# Patient Record
Sex: Male | Born: 1955
Health system: Southern US, Community
[De-identification: ages and names within clinical notes are randomized; demographics above are authoritative.]

## PROBLEM LIST (undated history)

## (undated) ENCOUNTER — Emergency Department (HOSPITAL_COMMUNITY): Payer: Medicare HMO

## (undated) DIAGNOSIS — M109 Gout, unspecified: Secondary | ICD-10-CM

## (undated) DIAGNOSIS — I499 Cardiac arrhythmia, unspecified: Secondary | ICD-10-CM

## (undated) DIAGNOSIS — F419 Anxiety disorder, unspecified: Secondary | ICD-10-CM

## (undated) DIAGNOSIS — C73 Malignant neoplasm of thyroid gland: Secondary | ICD-10-CM

## (undated) DIAGNOSIS — F1910 Other psychoactive substance abuse, uncomplicated: Secondary | ICD-10-CM

## (undated) DIAGNOSIS — J449 Chronic obstructive pulmonary disease, unspecified: Secondary | ICD-10-CM

## (undated) DIAGNOSIS — F32A Depression, unspecified: Secondary | ICD-10-CM

## (undated) DIAGNOSIS — I639 Cerebral infarction, unspecified: Secondary | ICD-10-CM

## (undated) DIAGNOSIS — H04129 Dry eye syndrome of unspecified lacrimal gland: Secondary | ICD-10-CM

## (undated) DIAGNOSIS — M1990 Unspecified osteoarthritis, unspecified site: Secondary | ICD-10-CM

## (undated) DIAGNOSIS — H269 Unspecified cataract: Secondary | ICD-10-CM

## (undated) DIAGNOSIS — E785 Hyperlipidemia, unspecified: Secondary | ICD-10-CM

## (undated) DIAGNOSIS — E039 Hypothyroidism, unspecified: Secondary | ICD-10-CM

## (undated) DIAGNOSIS — F329 Major depressive disorder, single episode, unspecified: Secondary | ICD-10-CM

## (undated) DIAGNOSIS — I10 Essential (primary) hypertension: Secondary | ICD-10-CM

## (undated) DIAGNOSIS — K219 Gastro-esophageal reflux disease without esophagitis: Secondary | ICD-10-CM

## (undated) DIAGNOSIS — T7840XA Allergy, unspecified, initial encounter: Secondary | ICD-10-CM

## (undated) HISTORY — DX: Major depressive disorder, single episode, unspecified: F32.9

## (undated) HISTORY — DX: Other psychoactive substance abuse, uncomplicated: F19.10

## (undated) HISTORY — DX: Essential (primary) hypertension: I10

## (undated) HISTORY — PX: CATARACT EXTRACTION: SUR2

## (undated) HISTORY — PX: EYE SURGERY: SHX253

## (undated) HISTORY — DX: Cerebral infarction, unspecified: I63.9

## (undated) HISTORY — PX: UPPER GI ENDOSCOPY: SHX6162

## (undated) HISTORY — DX: Allergy, unspecified, initial encounter: T78.40XA

## (undated) HISTORY — DX: Hyperlipidemia, unspecified: E78.5

## (undated) HISTORY — PX: ABDOMINAL SURGERY: SHX537

## (undated) HISTORY — PX: SPINE SURGERY: SHX786

## (undated) HISTORY — DX: Gout, unspecified: M10.9

## (undated) HISTORY — PX: FRACTURE SURGERY: SHX138

## (undated) HISTORY — DX: Depression, unspecified: F32.A

## (undated) HISTORY — DX: Unspecified osteoarthritis, unspecified site: M19.90

## (undated) HISTORY — DX: Unspecified cataract: H26.9

## (undated) HISTORY — PX: COLONOSCOPY: SHX174

---

## 1998-05-24 ENCOUNTER — Emergency Department (HOSPITAL_COMMUNITY): Admission: EM | Admit: 1998-05-24 | Discharge: 1998-05-24 | Payer: Self-pay | Admitting: Emergency Medicine

## 2013-04-18 ENCOUNTER — Other Ambulatory Visit: Payer: Self-pay | Admitting: Family Medicine

## 2013-04-18 DIAGNOSIS — Z87891 Personal history of nicotine dependence: Secondary | ICD-10-CM

## 2014-12-10 DIAGNOSIS — F329 Major depressive disorder, single episode, unspecified: Secondary | ICD-10-CM | POA: Diagnosis not present

## 2014-12-10 DIAGNOSIS — L219 Seborrheic dermatitis, unspecified: Secondary | ICD-10-CM | POA: Diagnosis not present

## 2014-12-10 DIAGNOSIS — F17210 Nicotine dependence, cigarettes, uncomplicated: Secondary | ICD-10-CM | POA: Diagnosis not present

## 2014-12-10 DIAGNOSIS — E785 Hyperlipidemia, unspecified: Secondary | ICD-10-CM | POA: Diagnosis not present

## 2014-12-10 DIAGNOSIS — H9192 Unspecified hearing loss, left ear: Secondary | ICD-10-CM | POA: Diagnosis not present

## 2014-12-10 DIAGNOSIS — J329 Chronic sinusitis, unspecified: Secondary | ICD-10-CM | POA: Diagnosis not present

## 2014-12-10 DIAGNOSIS — F419 Anxiety disorder, unspecified: Secondary | ICD-10-CM | POA: Diagnosis not present

## 2014-12-25 DIAGNOSIS — H9041 Sensorineural hearing loss, unilateral, right ear, with unrestricted hearing on the contralateral side: Secondary | ICD-10-CM | POA: Diagnosis not present

## 2014-12-25 DIAGNOSIS — J309 Allergic rhinitis, unspecified: Secondary | ICD-10-CM | POA: Diagnosis not present

## 2014-12-25 DIAGNOSIS — H5441 Blindness, right eye, normal vision left eye: Secondary | ICD-10-CM | POA: Diagnosis not present

## 2014-12-25 DIAGNOSIS — J342 Deviated nasal septum: Secondary | ICD-10-CM | POA: Diagnosis not present

## 2014-12-25 DIAGNOSIS — H9072 Mixed conductive and sensorineural hearing loss, unilateral, left ear, with unrestricted hearing on the contralateral side: Secondary | ICD-10-CM | POA: Diagnosis not present

## 2014-12-25 DIAGNOSIS — H6982 Other specified disorders of Eustachian tube, left ear: Secondary | ICD-10-CM | POA: Diagnosis not present

## 2015-05-03 DIAGNOSIS — H905 Unspecified sensorineural hearing loss: Secondary | ICD-10-CM | POA: Diagnosis not present

## 2015-05-03 DIAGNOSIS — J342 Deviated nasal septum: Secondary | ICD-10-CM | POA: Diagnosis not present

## 2015-05-03 DIAGNOSIS — H6983 Other specified disorders of Eustachian tube, bilateral: Secondary | ICD-10-CM | POA: Diagnosis not present

## 2015-09-20 DIAGNOSIS — H04123 Dry eye syndrome of bilateral lacrimal glands: Secondary | ICD-10-CM | POA: Diagnosis not present

## 2015-09-20 DIAGNOSIS — H2512 Age-related nuclear cataract, left eye: Secondary | ICD-10-CM | POA: Diagnosis not present

## 2015-09-20 DIAGNOSIS — H2701 Aphakia, right eye: Secondary | ICD-10-CM | POA: Diagnosis not present

## 2015-10-04 DIAGNOSIS — R062 Wheezing: Secondary | ICD-10-CM | POA: Diagnosis not present

## 2015-10-04 DIAGNOSIS — Z23 Encounter for immunization: Secondary | ICD-10-CM | POA: Diagnosis not present

## 2015-10-04 DIAGNOSIS — F419 Anxiety disorder, unspecified: Secondary | ICD-10-CM | POA: Diagnosis not present

## 2015-10-04 DIAGNOSIS — Z79899 Other long term (current) drug therapy: Secondary | ICD-10-CM | POA: Diagnosis not present

## 2015-10-04 DIAGNOSIS — F17210 Nicotine dependence, cigarettes, uncomplicated: Secondary | ICD-10-CM | POA: Diagnosis not present

## 2015-10-04 DIAGNOSIS — E785 Hyperlipidemia, unspecified: Secondary | ICD-10-CM | POA: Diagnosis not present

## 2015-10-04 DIAGNOSIS — Z0001 Encounter for general adult medical examination with abnormal findings: Secondary | ICD-10-CM | POA: Diagnosis not present

## 2015-10-30 DIAGNOSIS — Z1211 Encounter for screening for malignant neoplasm of colon: Secondary | ICD-10-CM | POA: Diagnosis not present

## 2015-11-06 ENCOUNTER — Other Ambulatory Visit: Payer: Self-pay | Admitting: Family Medicine

## 2015-11-06 DIAGNOSIS — R0600 Dyspnea, unspecified: Secondary | ICD-10-CM

## 2015-11-07 ENCOUNTER — Ambulatory Visit (INDEPENDENT_AMBULATORY_CARE_PROVIDER_SITE_OTHER): Payer: Commercial Managed Care - HMO | Admitting: Internal Medicine

## 2015-11-07 ENCOUNTER — Other Ambulatory Visit: Payer: Self-pay | Admitting: Family Medicine

## 2015-11-07 ENCOUNTER — Ambulatory Visit
Admission: RE | Admit: 2015-11-07 | Discharge: 2015-11-07 | Disposition: A | Discharge status: Home / Self Care | Payer: Commercial Managed Care - HMO | Source: Ambulatory Visit | Attending: Family Medicine | Admitting: Family Medicine

## 2015-11-07 DIAGNOSIS — R05 Cough: Secondary | ICD-10-CM | POA: Diagnosis not present

## 2015-11-07 DIAGNOSIS — R0600 Dyspnea, unspecified: Secondary | ICD-10-CM | POA: Diagnosis not present

## 2015-11-07 DIAGNOSIS — R062 Wheezing: Secondary | ICD-10-CM

## 2015-11-07 LAB — PULMONARY FUNCTION TEST
DL/VA % PRED: 102 %
DL/VA: 4.89 ml/min/mmHg/L
DLCO unc % pred: 79 %
DLCO unc: 29.03 ml/min/mmHg
FEF 25-75 POST: 2.15 L/s
FEF 25-75 PRE: 1.66 L/s
FEF2575-%Change-Post: 29 %
FEF2575-%PRED-PRE: 51 %
FEF2575-%Pred-Post: 66 %
FEV1-%Change-Post: 9 %
FEV1-%PRED-PRE: 60 %
FEV1-%Pred-Post: 65 %
FEV1-PRE: 2.4 L
FEV1-Post: 2.62 L
FEV1FVC-%CHANGE-POST: -1 %
FEV1FVC-%PRED-PRE: 92 %
FEV6-%CHANGE-POST: 9 %
FEV6-%Pred-Post: 73 %
FEV6-%Pred-Pre: 67 %
FEV6-POST: 3.72 L
FEV6-Pre: 3.39 L
FEV6FVC-%Change-Post: 0 %
FEV6FVC-%PRED-POST: 102 %
FEV6FVC-%Pred-Pre: 103 %
FVC-%CHANGE-POST: 10 %
FVC-%PRED-PRE: 64 %
FVC-%Pred-Post: 71 %
FVC-POST: 3.78 L
FVC-PRE: 3.42 L
PRE FEV1/FVC RATIO: 70 %
Post FEV1/FVC ratio: 69 %
Post FEV6/FVC ratio: 99 %
Pre FEV6/FVC Ratio: 99 %

## 2015-11-07 IMAGING — CR DG CHEST 2V
2 series · 2 of 2 positions shown · non-contrast
Comparison: No prior exam available for comparison.

CLINICAL DATA: Wheezing and cough.

EXAM:
CHEST  2 VIEW

[w chest pa]
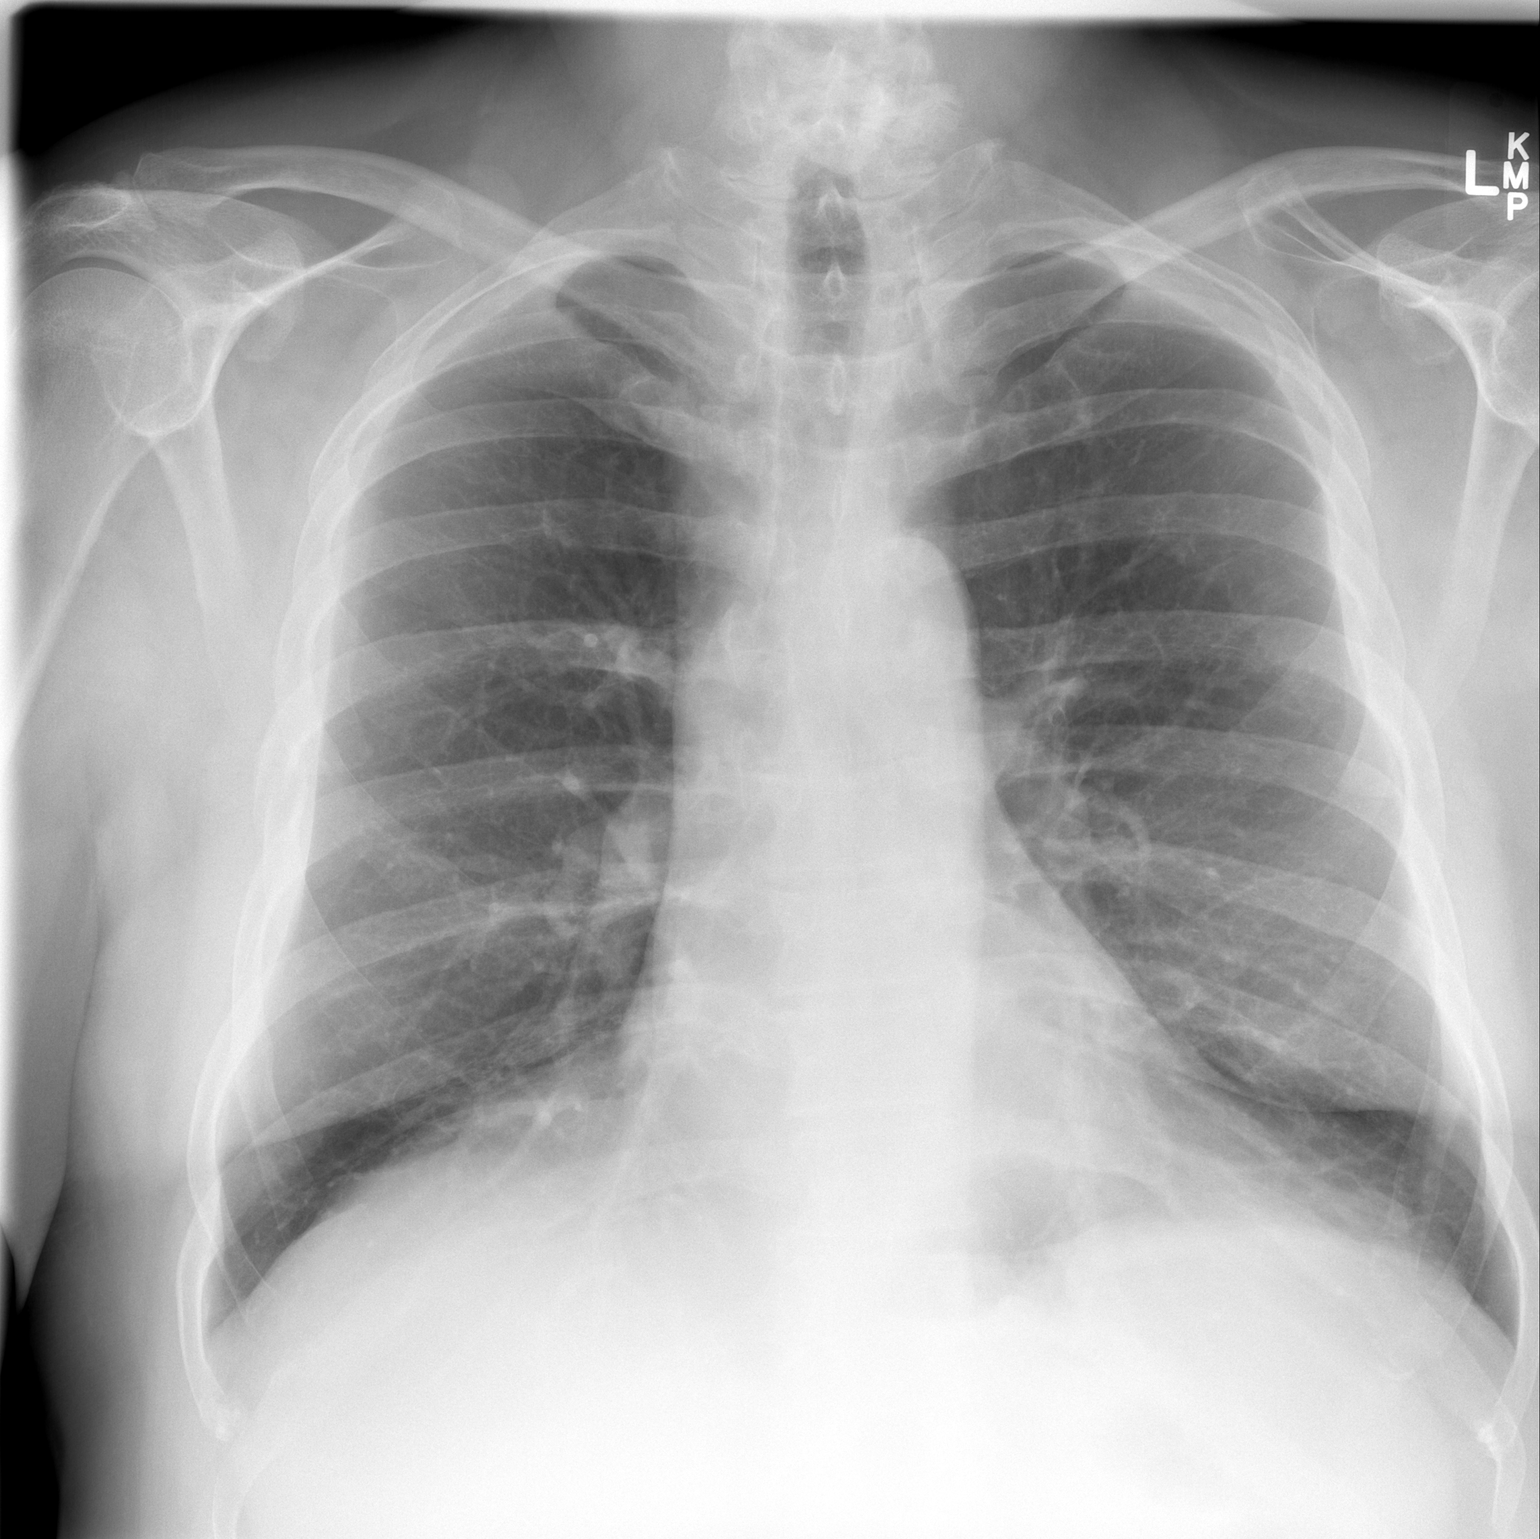

[w chest lat]
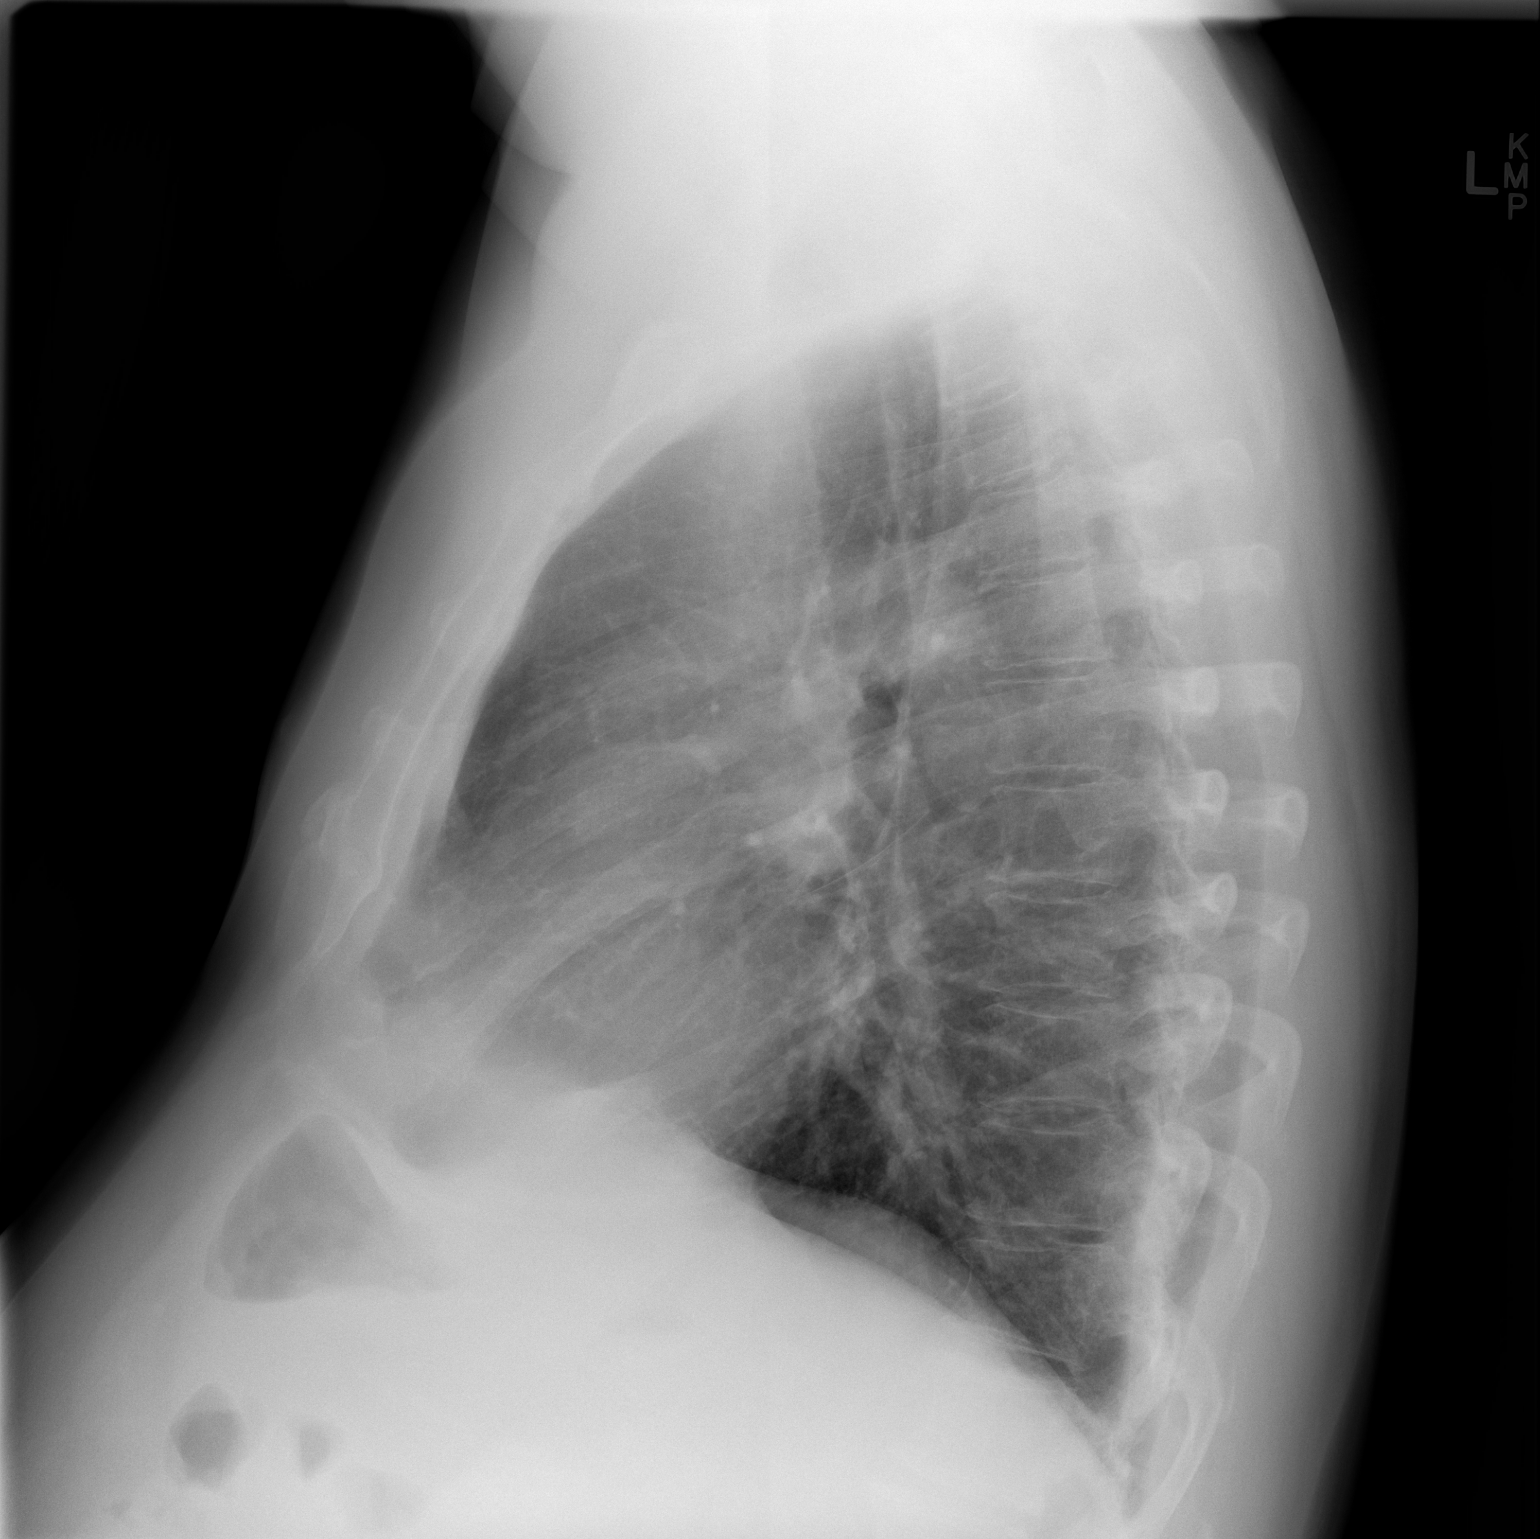

[2 of 2 positions shown; findings below may reference images not displayed]

FINDINGS: Mediastinum and hilar structures normal. Minimal lingular infiltrate
cannot be excluded . Cardiomegaly with normal pulmonary vascularity.
No pleural effusion or pneumothorax. Mild pleural thickening noted
bilaterally most likely scarring. No acute bony abnormality.
IMPRESSION: Minimal lingular infiltrate cannot be excluded.

## 2015-11-07 NOTE — Progress Notes (Signed)
PFT done today. 

## 2015-11-19 ENCOUNTER — Other Ambulatory Visit: Payer: Self-pay | Admitting: Family Medicine

## 2015-11-19 DIAGNOSIS — R9389 Abnormal findings on diagnostic imaging of other specified body structures: Secondary | ICD-10-CM

## 2015-11-27 ENCOUNTER — Ambulatory Visit
Admission: RE | Admit: 2015-11-27 | Discharge: 2015-11-27 | Disposition: A | Discharge status: Home / Self Care | Payer: Commercial Managed Care - HMO | Source: Ambulatory Visit | Attending: Family Medicine | Admitting: Family Medicine

## 2015-11-27 DIAGNOSIS — R9389 Abnormal findings on diagnostic imaging of other specified body structures: Secondary | ICD-10-CM

## 2015-11-27 DIAGNOSIS — R05 Cough: Secondary | ICD-10-CM | POA: Diagnosis not present

## 2015-11-27 DIAGNOSIS — R911 Solitary pulmonary nodule: Secondary | ICD-10-CM | POA: Diagnosis not present

## 2015-11-27 IMAGING — CT CT CHEST W/O CM
3 of 4 series · 17 of 30 positions shown, 19 images · non-contrast
Comparison: [DATE].

CLINICAL DATA: Cough, shortness of breath.

EXAM:
CT CHEST WITHOUT CONTRAST
TECHNIQUE: Multidetector CT imaging of the chest was performed following the
standard protocol without IV contrast.

[Series 3: chest w/o · axial · non-contrast · 0.78mm/px · z∈[-261,-41]mm · 5 of 68 slices shown, 7 images]
[im 12/68  mediastinal]
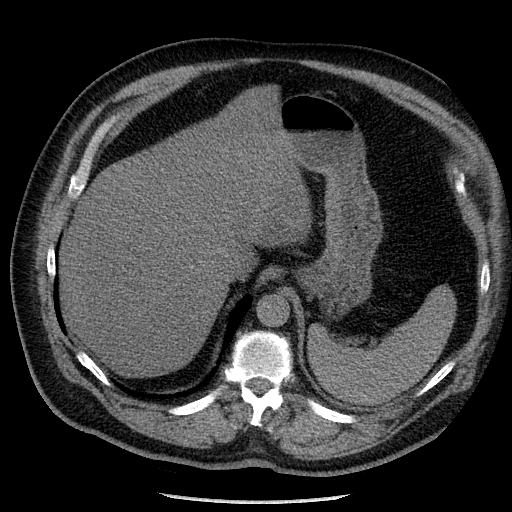
[im 12/68  lung]
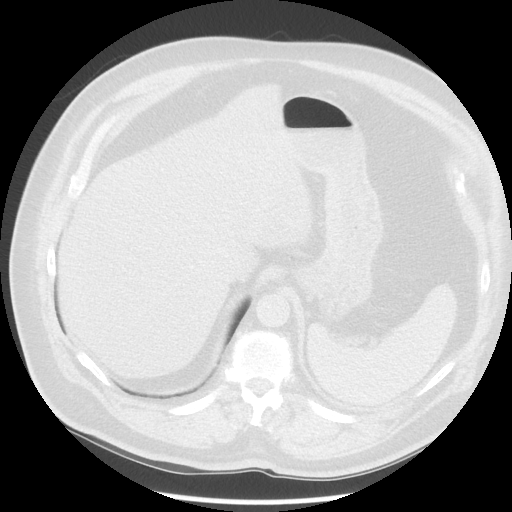
[im 23/68  lung]
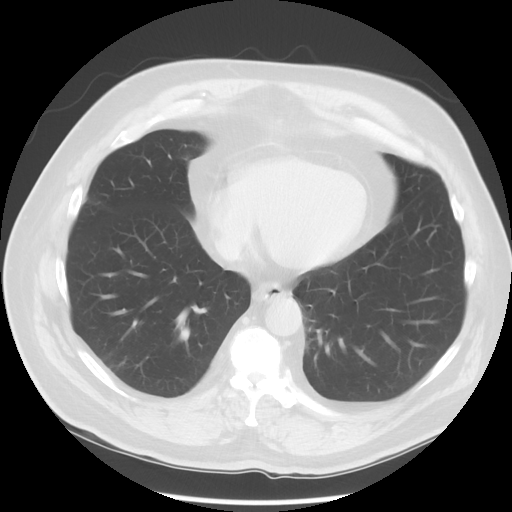
[im 34/68  lung]
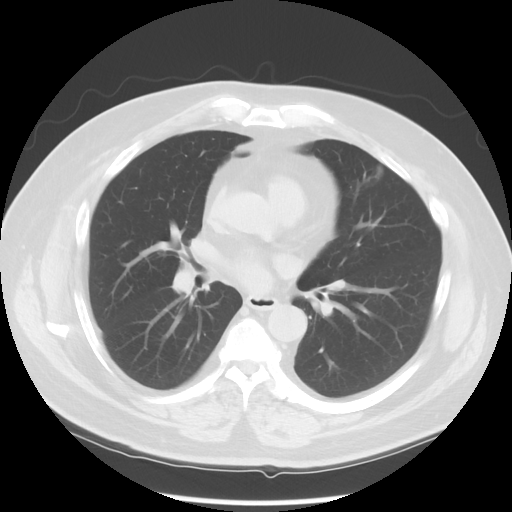
[im 45/68  lung]
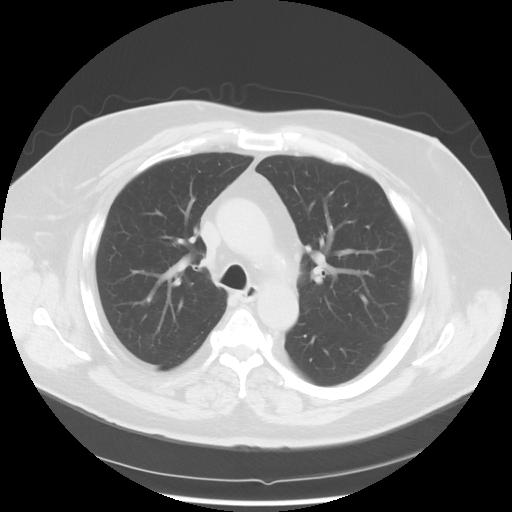
[im 56/68  mediastinal]
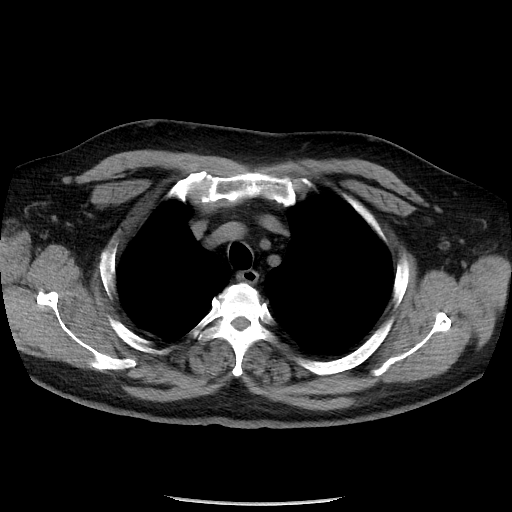
[im 56/68  lung]
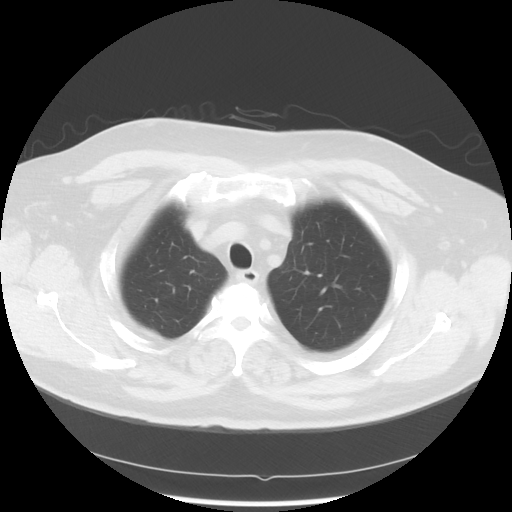

[Series 4: lung windows · axial · 0.78mm/px · z∈[-251,-51]mm · 4 of 68 slices shown]
[im 14/68  lung]
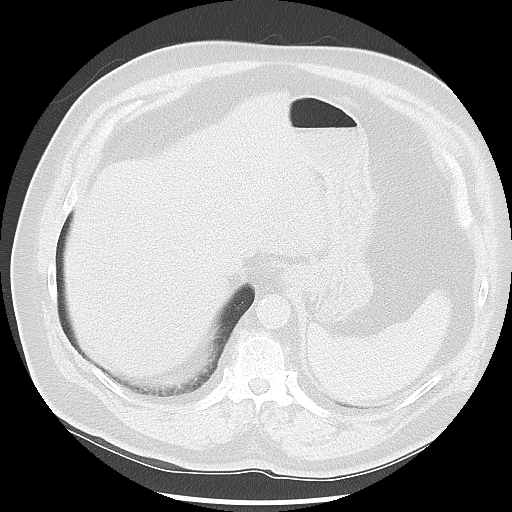
[im 27/68  lung]
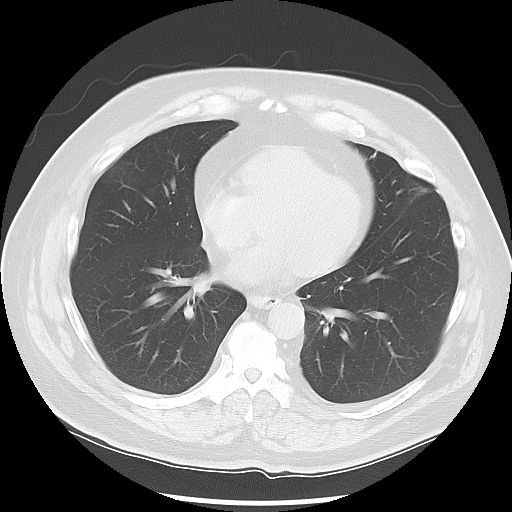
[im 41/68  lung]
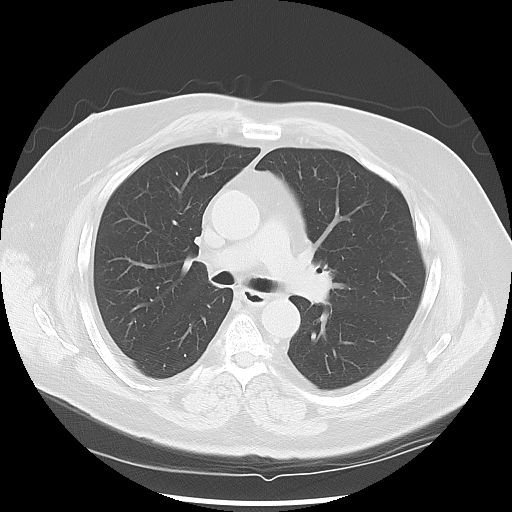
[im 54/68  lung]
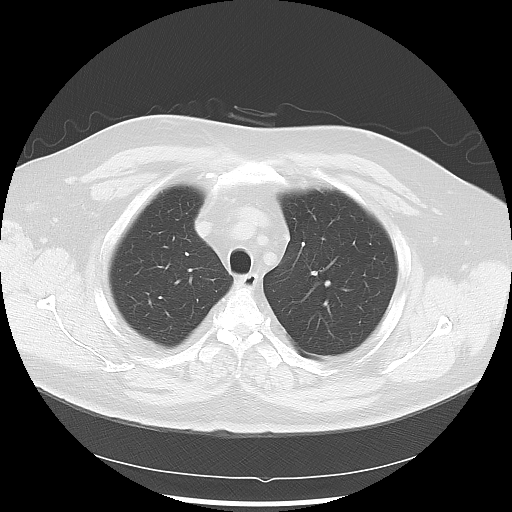

[Series 602: sagittal body · sagittal · 0.78mm/px · 8 of 161 slices shown]
[im 12/161  mediastinal]
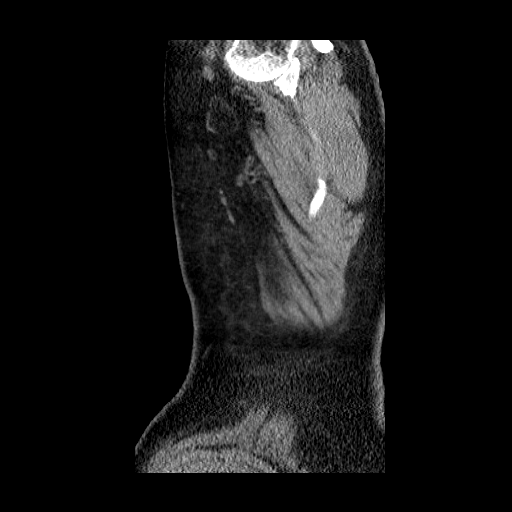
[im 35/161  mediastinal]
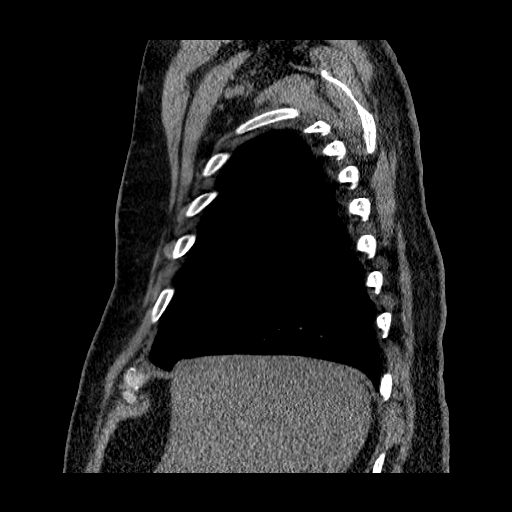
[im 58/161  mediastinal]
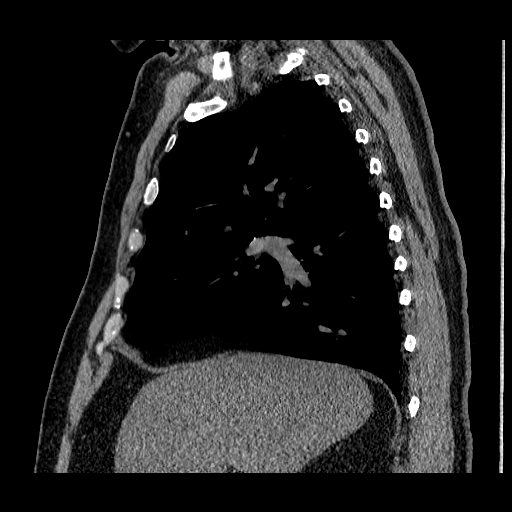
[im 69/161  mediastinal]
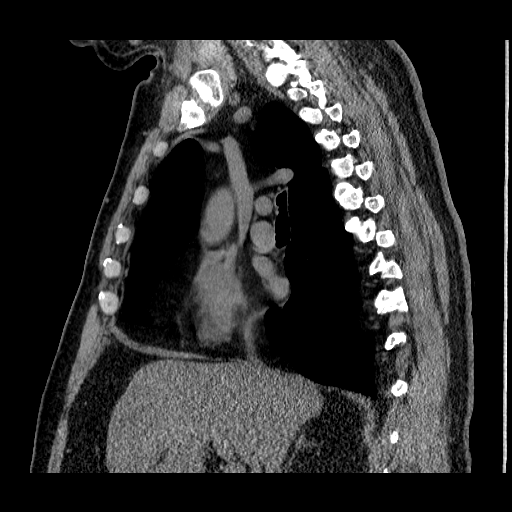
[im 92/161  mediastinal]
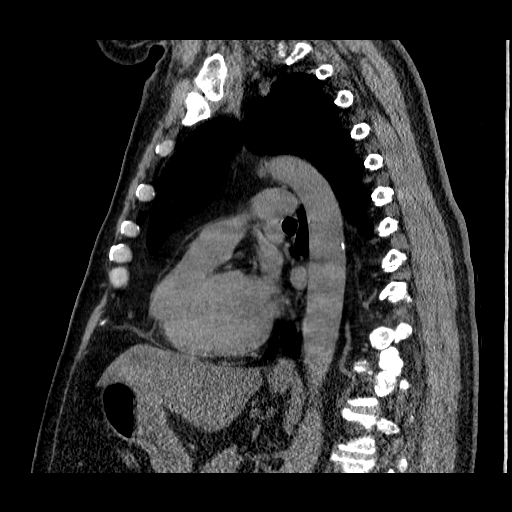
[im 103/161  mediastinal]
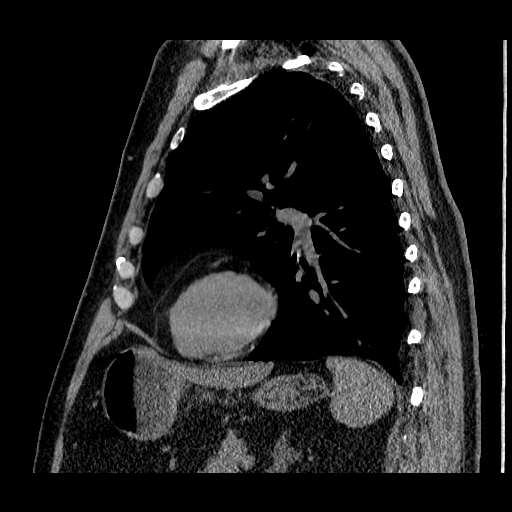
[im 126/161  mediastinal]
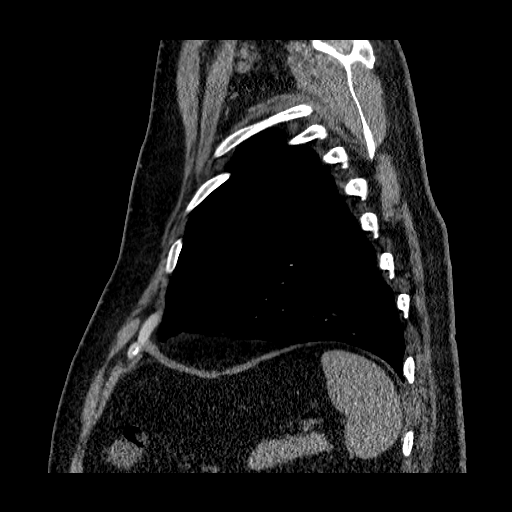
[im 149/161  mediastinal]
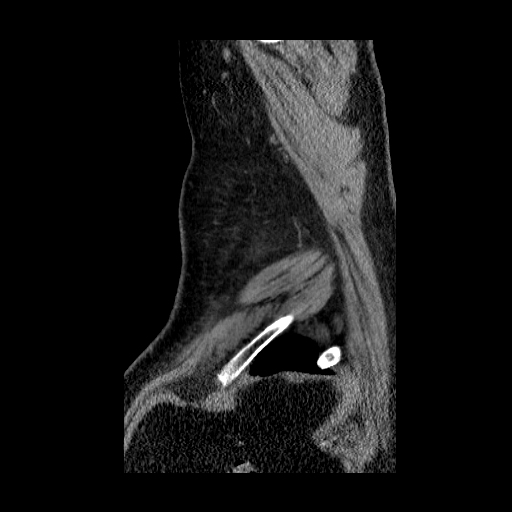

[17 of 30 positions shown; findings below may reference images not displayed]

FINDINGS: No pneumothorax or pleural effusion is noted. 6 mm nodule is noted
anteriorly in the left upper lobe. Right lung is clear. No
significant osseous abnormality is noted. Visualized portion of
upper abdomen is unremarkable. Atherosclerosis of abdominal aorta is
noted. There appears to be a small penetrating aortic ulcer arising
from the transverse aortic arch.
IMPRESSION: Small penetrating aortic ulcer arising from the transverse aortic
arch.

6 mm nodule noted in left upper lobe. If the patient is at high risk
for bronchogenic carcinoma, follow-up chest CT at 6-12 months is
recommended. If the patient is at low risk for bronchogenic
carcinoma, follow-up chest CT at 12 months is recommended. This
recommendation follows the consensus statement: Guidelines for
Management of Small Pulmonary Nodules Detected on CT Scans: A
Statement from the [HOSPITAL] as published in Radiology

## 2015-12-06 ENCOUNTER — Encounter: Payer: Self-pay | Admitting: Surgery

## 2015-12-20 ENCOUNTER — Encounter: Payer: Self-pay | Admitting: Surgery

## 2015-12-30 ENCOUNTER — Encounter: Payer: Self-pay | Admitting: Surgery

## 2015-12-30 ENCOUNTER — Ambulatory Visit (INDEPENDENT_AMBULATORY_CARE_PROVIDER_SITE_OTHER): Payer: Commercial Managed Care - HMO | Admitting: Surgery

## 2015-12-30 VITALS — BP 133/76 | HR 70 | Temp 97.9°F | Resp 18 | Ht 73.0 in | Wt 269.0 lb

## 2015-12-30 DIAGNOSIS — Z01812 Encounter for preprocedural laboratory examination: Secondary | ICD-10-CM | POA: Diagnosis not present

## 2015-12-30 DIAGNOSIS — I700 Atherosclerosis of aorta: Secondary | ICD-10-CM | POA: Diagnosis not present

## 2015-12-30 NOTE — Progress Notes (Signed)
Patient name: Timothy Conway MRN: [id number] DOB: 1956/09/17 Sex: male   Referred by: Dr. Ardeth Perfect  Reason for referral:  Chief Complaint  Patient presents with  . New Evaluation    Aortic ulcer  Ref by Dr. Dorthy Cooler    Finding on CT chest scan done for COPD    HISTORY OF PRESENT ILLNESS:  this is a 60 year old gentleman who comes in today for evaluation of a transverse arch penetrating ulcer that was detected on a CT scan that was done for shortness of breath. The patient denies any chest pain or back pain. He states that he will occasionally have PACs. He suffers from hypercholesterolemia which is managed with a statin.  He is a current everyday smoker and has COPD  Past Medical History  Diagnosis Date  . Hypertension   . Hyperlipidemia   . Depression   . Gout     Past Surgical History  Procedure Laterality Date  . Cataract extraction Right     Social History   Social History  . Marital Status: Single    Spouse Name: N/A  . Number of Children: N/A  . Years of Education: N/A   Occupational History  . Not on file.   Social History Main Topics  . Smoking status: Current Every Day Smoker -- 1.00 packs/day for 40 years    Types: Cigarettes  . Smokeless tobacco: Not on file  . Alcohol Use: No  . Drug Use: Not on file  . Sexual Activity: Not on file   Other Topics Concern  . Not on file   Social History Narrative    Family History  Problem Relation Age of Onset  . Adopted: Yes    Allergies as of 12/30/2015  . (No Known Allergies)    Current Outpatient Prescriptions on File Prior to Visit  Medication Sig Dispense Refill  . ALPRAZolam (XANAX) 1 MG tablet Take 1 mg by mouth at bedtime as needed for anxiety (per Dr Dorthy Cooler medication list take 1 tablet 5-6 times daily as needed). Per health/medication list sent by Dr Dorthy Cooler    . ARIPiprazole (ABILIFY) 5 MG tablet Take 5 mg by mouth daily.    Marland Kitchen aspirin 81 MG tablet Take 81 mg by mouth daily.    Marland Kitchen  atenolol (TENORMIN) 100 MG tablet Take 100 mg by mouth 2 (two) times daily.    . hydrocortisone (ANUSOL-HC) 2.5 % rectal cream Place 1 application rectally every 30 (thirty) days. Application to affected area as needed one week a month    . ketoconazole (NIZORAL) 2 % shampoo Apply 1 application topically 2 (two) times a week.    . Omega-3 Fatty Acids (FISH OIL) 1200 MG CAPS Take 1,200 mg by mouth daily.    . pravastatin (PRAVACHOL) 20 MG tablet Take 20 mg by mouth daily.     No current facility-administered medications on file prior to visit.     REVIEW OF SYSTEMS: Cardiovascular: No chest pain, chest pressure, palpitations, orthopnea, or dyspnea on exertion. No claudication or rest pain,  No history of DVT or phlebitis. Pulmonary:  Positive for shortness of breath.  Positive for productive cough.  Positive for wheezing Neurologic: No weakness, paresthesias, aphasia, or amaurosis. No dizziness. Hematologic: No bleeding problems or clotting disorders. Musculoskeletal: No joint pain or joint swelling. Gastrointestinal: No blood in stool or hematemesis Genitourinary: No dysuria or hematuria. Psychiatric::  Positive for major depression. Integumentary: No rashes or ulcers. Constitutional: No fever or chills.  PHYSICAL EXAMINATION:  Filed Vitals:   12/30/15 1317  BP: 133/76  Pulse: 70  Temp: 97.9 F (36.6 C)  TempSrc: Oral  Resp: 18  Height: '6\' 1"'$  (1.854 m)  Weight: 269 lb (122.018 kg)  SpO2: 95%   Body mass index is 35.5 kg/(m^2). General: The patient appears their stated age.   HEENT:  No gross abnormalities Pulmonary: Respirations are non-labored Musculoskeletal: There are no major deformities.   Neurologic: No focal weakness or paresthesias are detected, Skin: There are no ulcer or rashes noted. Psychiatric: The patient has normal affect. Cardiovascular: There is a regular rate and rhythm without significant murmur appreciated. No carotid bruits  Diagnostic Studies:  I  have reviewed his CT scan which was without IV contrast, revealing a small transverse arch ulcer   Assessment:   transverse aortic arch ulcer Plan:  I discussed with the patient that to better appreciate and understand the complexity of his aortic pathology that we need to proceed with a CT angiogram with IV contrast to better define this lesion and to make sure that it is real. He would like to get this done in the immediate future.  I will get this scheduled ,  And have a follow-up study     V. Leia Alf, M.D. [facility] [place] Office: [phone] Pager:  [phone]

## 2015-12-30 NOTE — Addendum Note (Signed)
Addended by: Dorthula Rue L on: 12/30/2015 02:33 PM   Modules accepted: Orders

## 2015-12-31 ENCOUNTER — Telehealth: Payer: Self-pay | Admitting: *Deleted

## 2015-12-31 NOTE — Telephone Encounter (Signed)
I called Dr. Versie Starks office to make sure that they are following up on the 11-27-15 CT Chest which showed a possible lung lesion. Patient is a 40+ year, pack a day smoker. Left a message for them to call me back to verify and to let them know that Dr. Trula Slade only addressed the Aortic ulcer. We have scheduled a CTA for the patient with follow up with Dr. Trula Slade in 1 month.

## 2016-01-28 ENCOUNTER — Encounter: Payer: Self-pay | Admitting: Surgery

## 2016-02-03 ENCOUNTER — Other Ambulatory Visit: Payer: Commercial Managed Care - HMO

## 2016-02-03 ENCOUNTER — Ambulatory Visit: Payer: Commercial Managed Care - HMO | Admitting: Surgery

## 2016-03-11 DIAGNOSIS — E860 Dehydration: Secondary | ICD-10-CM | POA: Diagnosis not present

## 2016-03-11 DIAGNOSIS — K529 Noninfective gastroenteritis and colitis, unspecified: Secondary | ICD-10-CM | POA: Diagnosis not present

## 2016-03-11 DIAGNOSIS — R110 Nausea: Secondary | ICD-10-CM | POA: Diagnosis not present

## 2016-03-20 DIAGNOSIS — J9601 Acute respiratory failure with hypoxia: Secondary | ICD-10-CM | POA: Diagnosis not present

## 2016-03-20 DIAGNOSIS — R932 Abnormal findings on diagnostic imaging of liver and biliary tract: Secondary | ICD-10-CM | POA: Diagnosis not present

## 2016-03-20 DIAGNOSIS — J9600 Acute respiratory failure, unspecified whether with hypoxia or hypercapnia: Secondary | ICD-10-CM | POA: Diagnosis not present

## 2016-03-20 DIAGNOSIS — J449 Chronic obstructive pulmonary disease, unspecified: Secondary | ICD-10-CM | POA: Diagnosis not present

## 2016-03-20 DIAGNOSIS — K659 Peritonitis, unspecified: Secondary | ICD-10-CM | POA: Diagnosis not present

## 2016-03-20 DIAGNOSIS — Z01818 Encounter for other preprocedural examination: Secondary | ICD-10-CM | POA: Diagnosis not present

## 2016-03-20 DIAGNOSIS — R1013 Epigastric pain: Secondary | ICD-10-CM | POA: Diagnosis not present

## 2016-03-20 DIAGNOSIS — F419 Anxiety disorder, unspecified: Secondary | ICD-10-CM | POA: Diagnosis not present

## 2016-03-20 DIAGNOSIS — F17210 Nicotine dependence, cigarettes, uncomplicated: Secondary | ICD-10-CM | POA: Diagnosis not present

## 2016-03-20 DIAGNOSIS — R05 Cough: Secondary | ICD-10-CM | POA: Diagnosis not present

## 2016-03-20 DIAGNOSIS — J9602 Acute respiratory failure with hypercapnia: Secondary | ICD-10-CM | POA: Diagnosis not present

## 2016-03-20 DIAGNOSIS — R109 Unspecified abdominal pain: Secondary | ICD-10-CM | POA: Diagnosis not present

## 2016-03-20 DIAGNOSIS — N179 Acute kidney failure, unspecified: Secondary | ICD-10-CM | POA: Diagnosis not present

## 2016-03-20 DIAGNOSIS — R400 Somnolence: Secondary | ICD-10-CM | POA: Diagnosis not present

## 2016-03-20 DIAGNOSIS — A419 Sepsis, unspecified organism: Secondary | ICD-10-CM | POA: Diagnosis not present

## 2016-03-20 DIAGNOSIS — K255 Chronic or unspecified gastric ulcer with perforation: Secondary | ICD-10-CM | POA: Diagnosis not present

## 2016-03-20 DIAGNOSIS — K251 Acute gastric ulcer with perforation: Secondary | ICD-10-CM | POA: Diagnosis not present

## 2016-03-20 DIAGNOSIS — R0602 Shortness of breath: Secondary | ICD-10-CM | POA: Diagnosis not present

## 2016-04-15 DIAGNOSIS — K255 Chronic or unspecified gastric ulcer with perforation: Secondary | ICD-10-CM

## 2016-04-15 HISTORY — DX: Chronic or unspecified gastric ulcer with perforation: K25.5

## 2016-05-04 ENCOUNTER — Other Ambulatory Visit: Payer: Self-pay | Admitting: Family Medicine

## 2016-05-04 DIAGNOSIS — R911 Solitary pulmonary nodule: Secondary | ICD-10-CM

## 2016-06-04 DIAGNOSIS — Z1211 Encounter for screening for malignant neoplasm of colon: Secondary | ICD-10-CM | POA: Diagnosis not present

## 2016-06-04 DIAGNOSIS — K253 Acute gastric ulcer without hemorrhage or perforation: Secondary | ICD-10-CM | POA: Diagnosis not present

## 2016-06-04 DIAGNOSIS — K251 Acute gastric ulcer with perforation: Secondary | ICD-10-CM | POA: Diagnosis not present

## 2016-06-05 DIAGNOSIS — K259 Gastric ulcer, unspecified as acute or chronic, without hemorrhage or perforation: Secondary | ICD-10-CM | POA: Diagnosis not present

## 2016-06-05 DIAGNOSIS — K2950 Unspecified chronic gastritis without bleeding: Secondary | ICD-10-CM | POA: Diagnosis not present

## 2016-06-05 DIAGNOSIS — Z8711 Personal history of peptic ulcer disease: Secondary | ICD-10-CM | POA: Diagnosis not present

## 2016-06-08 ENCOUNTER — Ambulatory Visit
Admission: RE | Admit: 2016-06-08 | Discharge: 2016-06-08 | Disposition: A | Discharge status: Home / Self Care | Payer: Commercial Managed Care - HMO | Source: Ambulatory Visit | Attending: Surgery | Admitting: Surgery

## 2016-06-08 DIAGNOSIS — I700 Atherosclerosis of aorta: Secondary | ICD-10-CM | POA: Diagnosis not present

## 2016-06-08 DIAGNOSIS — K2950 Unspecified chronic gastritis without bleeding: Secondary | ICD-10-CM | POA: Diagnosis not present

## 2016-06-08 DIAGNOSIS — Z0000 Encounter for general adult medical examination without abnormal findings: Secondary | ICD-10-CM | POA: Diagnosis not present

## 2016-06-08 IMAGING — CT CT ANGIO CHEST
1 series · 19 of 32 positions shown · IV contrast (APPLIED)
Comparison: CT scan of [DATE].

CLINICAL DATA: Thoracic aortic atherosclerosis.

EXAM:
CT ANGIOGRAPHY CHEST WITH CONTRAST
TECHNIQUE: Multidetector CT imaging of the chest was performed using the
standard protocol during bolus administration of intravenous
contrast. Multiplanar CT image reconstructions and MIPs were
obtained to evaluate the vascular anatomy.
CONTRAST:  80 mL of Isovue 370 intravenously.

[Series 5: chest angio · axial · 0.83mm/px · z∈[-374,-41]mm · 19 of 119 slices shown]
[im 4/119  lung]
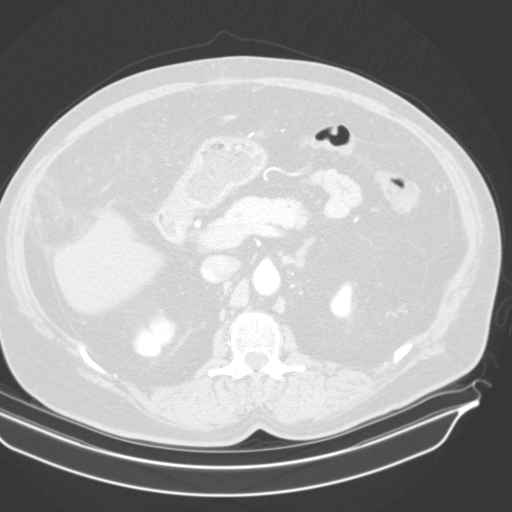
[im 12/119  soft-tissue]
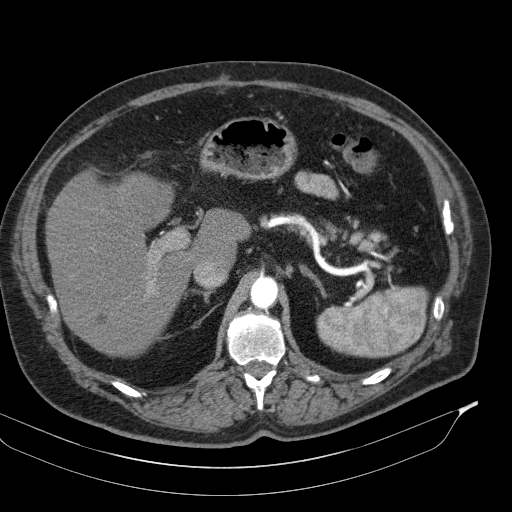
[im 16/119  lung]
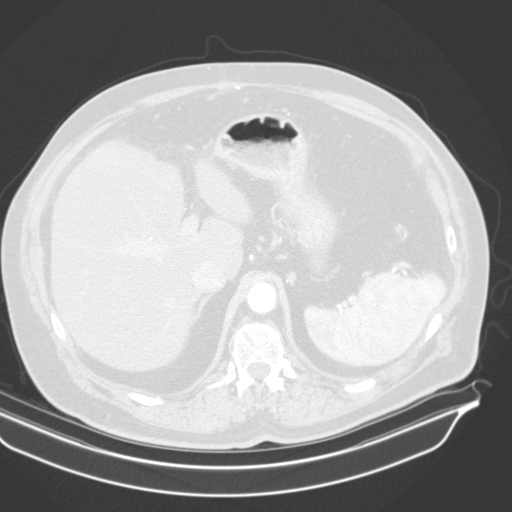
[im 23/119  soft-tissue]
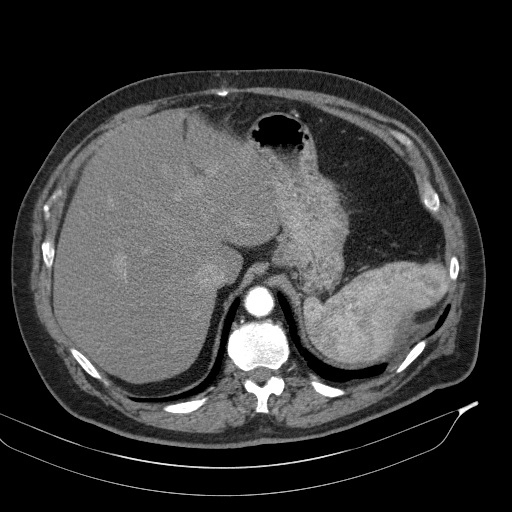
[im 31/119  lung]
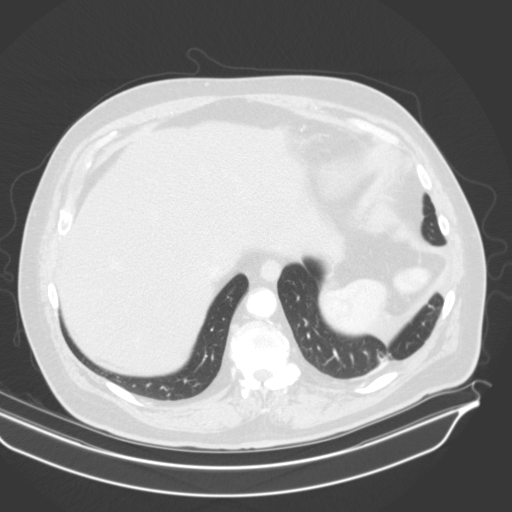
[im 35/119  soft-tissue]
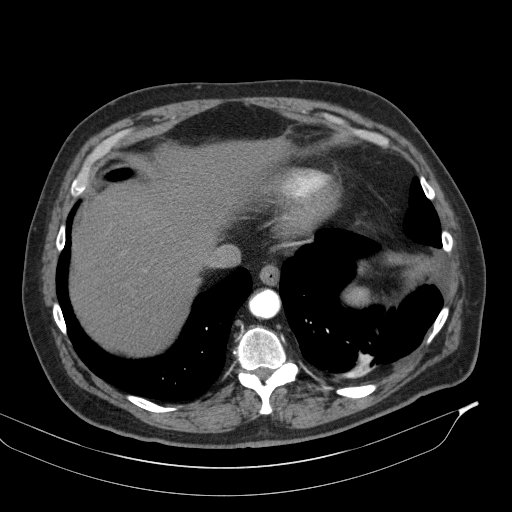
[im 42/119  lung]
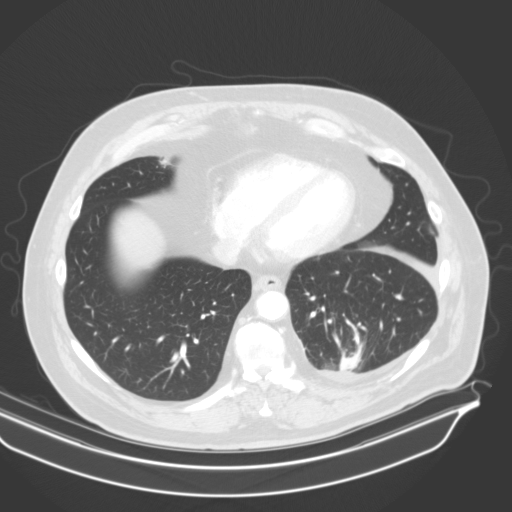
[im 46/119  soft-tissue]
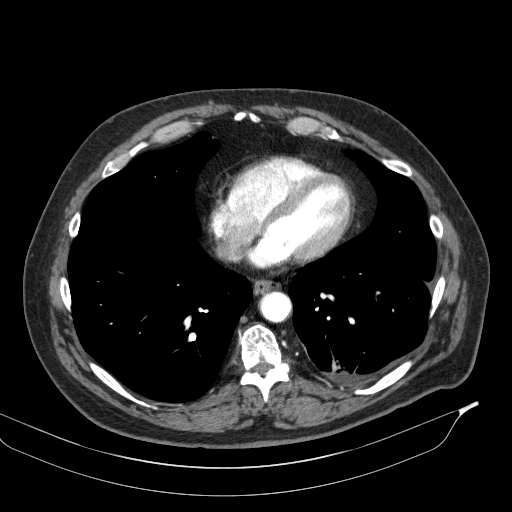
[im 54/119  lung]
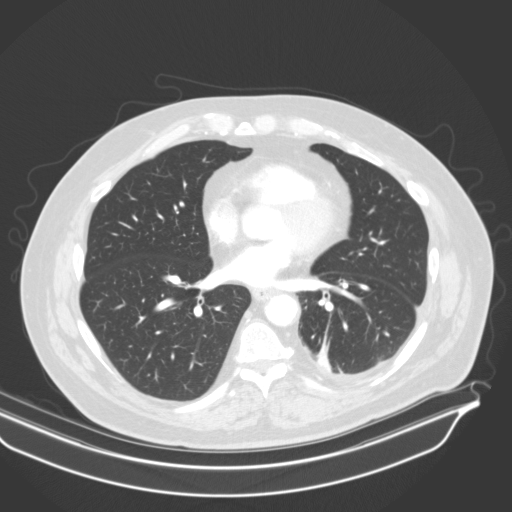
[im 61/119  soft-tissue]
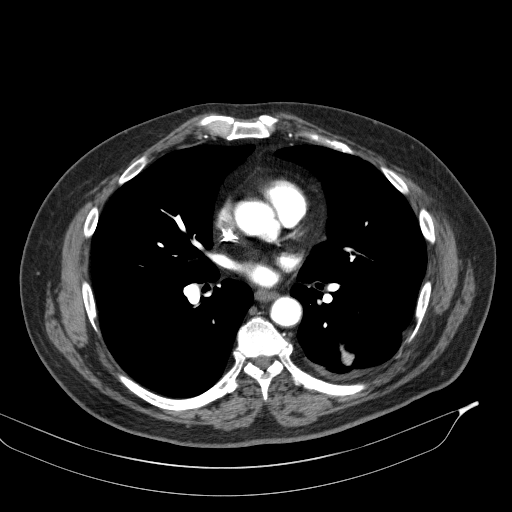
[im 65/119  lung]
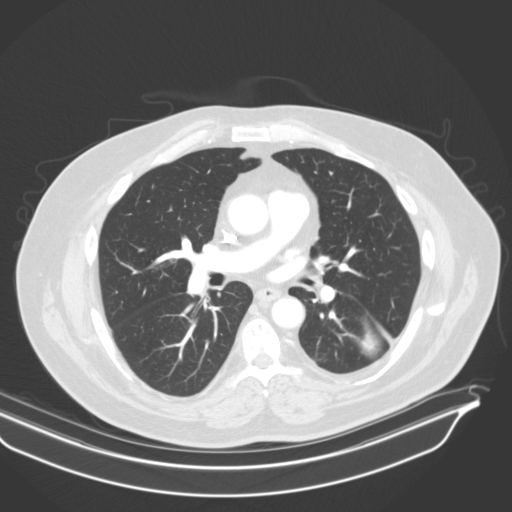
[im 73/119  soft-tissue]
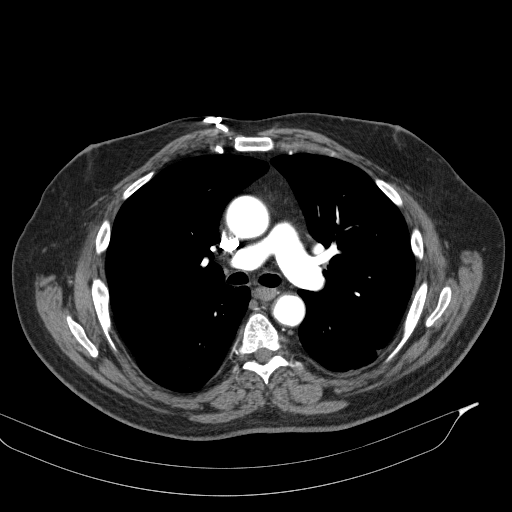
[im 77/119  lung]
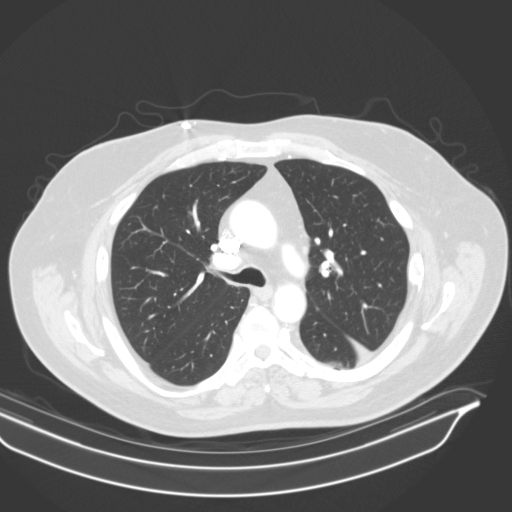
[im 84/119  soft-tissue]
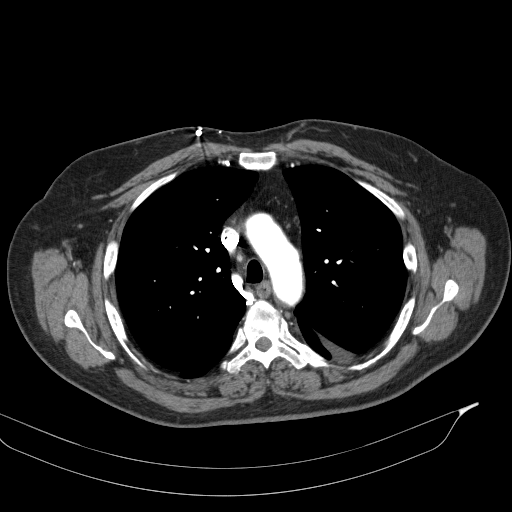
[im 88/119  lung]
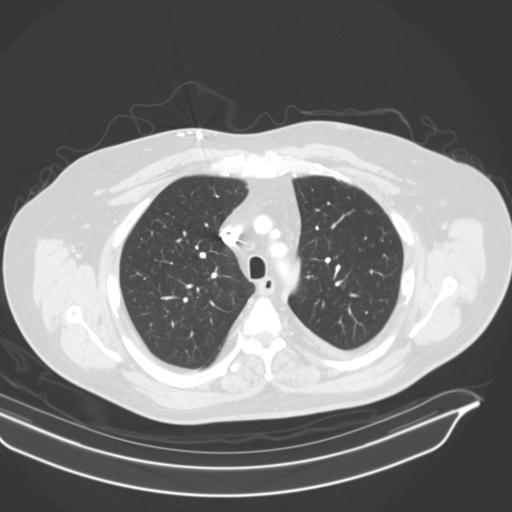
[im 96/119  soft-tissue]
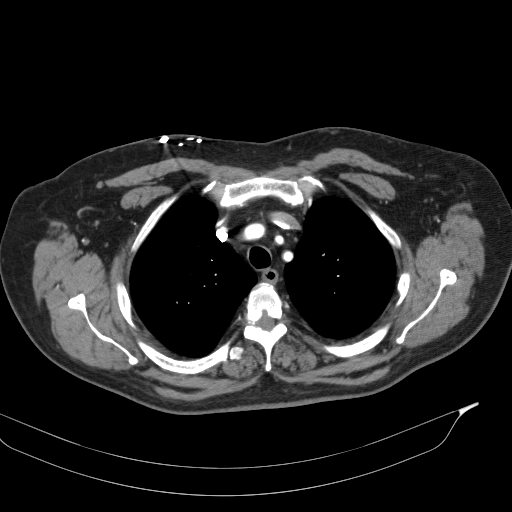
[im 103/119  lung]
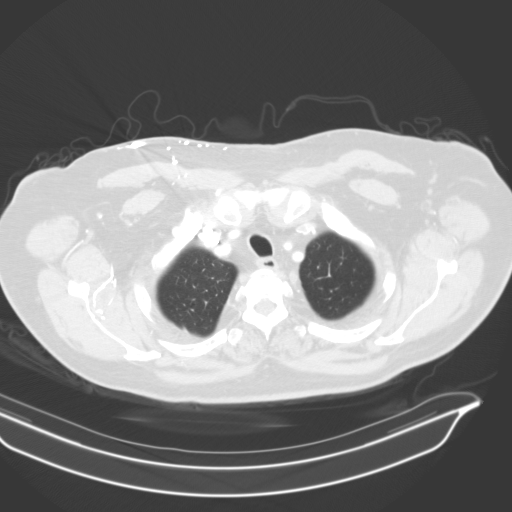
[im 107/119  soft-tissue]
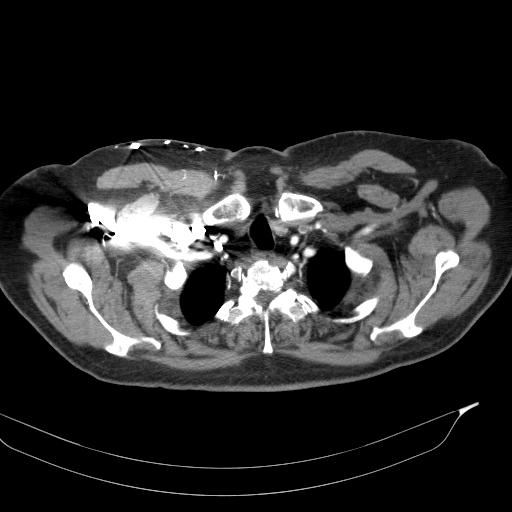
[im 115/119  lung]
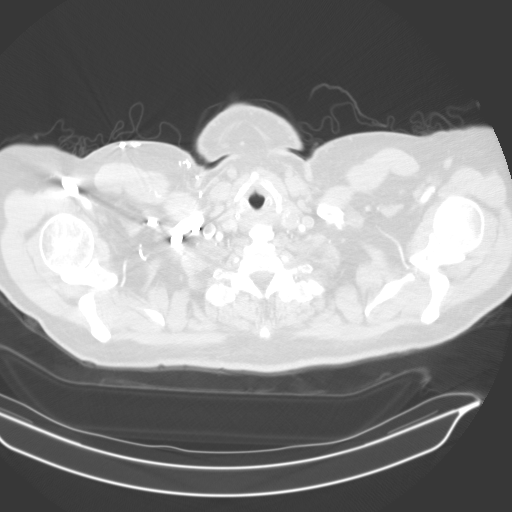

[19 of 32 positions shown; findings below may reference images not displayed]

FINDINGS: No pneumothorax is noted. Right lung is clear. There is interval
development of opacity seen posteriorly in the left lower lobe most
consistent with scarring or atelectasis, although focal prominent
area measuring 25 x 16 mm is noted posteriorly. Fluid is noted in
the left major fissure. There is no evidence of mediastinal mass or
adenopathy. There is no evidence of thoracic aortic aneurysm.
Atherosclerosis is noted with penetrating atherosclerotic ulcer seen
arising from the left side of transverse aortic arch measuring 8 mm;
this appears to be stable compared to prior exam. Great vessels are
widely patent without significant stenosis. No significant osseous
abnormality is noted. No significant abnormality is noted in the
visualized portion of the upper abdomen.

Review of the MIP images confirms the above findings.
IMPRESSION: Atherosclerosis thoracic aorta is noted with stable penetrating
atherosclerotic ulcer seen arising from transverse aortic arch
measuring 8 mm.

Interval development of opacity posteriorly in left lower lobe
consistent with scarring or subsegmental atelectasis ; however,
focal prominent area is seen more posteriorly and inferiorly within
this abnormality measuring 25 x 16 mm ; underlying neoplasm cannot
be excluded and follow-up CT scan in 3-4 weeks is recommended to
ensure resolution or stability, and rule out potential underlying
malignancy.

## 2016-06-08 MED ORDER — IOPAMIDOL (ISOVUE-370) INJECTION 76%
80.0000 mL | Freq: Once | INTRAVENOUS | Status: AC | PRN
  Administered 2016-06-08: 80 mL via INTRAVENOUS

## 2016-06-11 ENCOUNTER — Encounter: Payer: Self-pay | Admitting: Surgery

## 2016-06-15 ENCOUNTER — Ambulatory Visit: Payer: Commercial Managed Care - HMO | Admitting: Surgery

## 2016-06-18 ENCOUNTER — Encounter: Payer: Self-pay | Admitting: Surgery

## 2016-06-29 ENCOUNTER — Encounter: Payer: Self-pay | Admitting: Surgery

## 2016-06-29 ENCOUNTER — Other Ambulatory Visit: Payer: Self-pay

## 2016-06-29 ENCOUNTER — Ambulatory Visit (INDEPENDENT_AMBULATORY_CARE_PROVIDER_SITE_OTHER): Payer: Commercial Managed Care - HMO | Admitting: Surgery

## 2016-06-29 VITALS — BP 136/78 | HR 66 | Temp 98.6°F | Resp 16 | Ht 73.0 in | Wt 232.0 lb

## 2016-06-29 DIAGNOSIS — I700 Atherosclerosis of aorta: Secondary | ICD-10-CM | POA: Diagnosis not present

## 2016-06-29 NOTE — Addendum Note (Signed)
Addended by: Thresa Ross C on: 06/29/2016 01:15 PM   Modules accepted: Orders

## 2016-06-29 NOTE — Addendum Note (Signed)
Addended by: Thresa Ross C on: 06/29/2016 01:18 PM   Modules accepted: Orders

## 2016-06-29 NOTE — Progress Notes (Signed)
[facility] [place]  Patient name: Timothy Conway MRN: [id number] DOB: Dec 12, 1955 Sex: male  REASON FOR VISIT: follow up  HPI: Timothy Conway is a 60 y.o. male who returns today for follow-up of a transverse arch penetrating ulcer.  This was initially detected in late 2016, when he had a CT scan done for shortness of breath.  He does not endorse any back pain or chest pain.  He suffers from hypercholesterolemia, treated with a statin.  He is a current everyday smoker and has COPD.  Since his last visit he developed a perforated gastric ulcer requiring surgery at [facility].  He is recovering from that.  Past Medical History:  Diagnosis Date  . Depression   . Gout   . Hyperlipidemia   . Hypertension     Family History  Problem Relation Age of Onset  . Adopted: Yes    SOCIAL HISTORY: Social History  Substance Use Topics  . Smoking status: Current Every Day Smoker    Packs/day: 1.00    Years: 40.00    Types: Cigarettes  . Smokeless tobacco: Never Used  . Alcohol use No    No Known Allergies  Current Outpatient Prescriptions  Medication Sig Dispense Refill  . ADVAIR DISKUS 250-50 MCG/DOSE AEPB     . ALPRAZolam (XANAX) 1 MG tablet Take 1 mg by mouth at bedtime as needed for anxiety (per Dr Dorthy Cooler medication list take 1 tablet 5-6 times daily as needed). Per health/medication list sent by Dr Dorthy Cooler    . ARIPiprazole (ABILIFY) 5 MG tablet Take 5 mg by mouth daily.    Marland Kitchen atenolol (TENORMIN) 100 MG tablet Take 100 mg by mouth 2 (two) times daily.    Marland Kitchen co-enzyme Q-10 30 MG capsule     . gemfibrozil (LOPID) 600 MG tablet Take 600 mg by mouth 2 (two) times daily before a meal.    . hydrocortisone (ANUSOL-HC) 2.5 % rectal cream Place 1 application rectally every 30 (thirty) days. Application to affected area as needed one week a month    . ketoconazole (NIZORAL) 2 % shampoo Apply 1 application topically 2 (two) times  a week.    . Omega-3 Fatty Acids (FISH OIL) 1200 MG CAPS Take 1,200 mg by mouth daily.    . pravastatin (PRAVACHOL) 20 MG tablet Take 20 mg by mouth daily.    Marland Kitchen aspirin 81 MG tablet Take 81 mg by mouth daily.    Marland Kitchen doxepin (SINEQUAN) 50 MG capsule Take 4 capsules by mouth daily.    Enid Cutter HFA 108 (90 Base) MCG/ACT inhaler Reported on 12/30/2015  11   No current facility-administered medications for this visit.     REVIEW OF SYSTEMS:  '[X]'$  denotes positive finding, '[ ]'$  denotes negative finding Cardiac  Comments:  Chest pain or chest pressure:    Shortness of breath upon exertion: x   Short of breath when lying flat:    Irregular heart rhythm:        Vascular    Pain in calf, thigh, or hip brought on by ambulation:    Pain in feet at night that wakes you up from your sleep:     Blood clot in your veins:    Leg swelling:         Pulmonary    Oxygen at home:    Productive cough:     Wheezing:  x       Neurologic    Sudden weakness in  arms or legs:     Sudden numbness in arms or legs:     Sudden onset of difficulty speaking or slurred speech:    Temporary loss of vision in one eye:     Problems with dizziness:         Gastrointestinal    Blood in stool:     Vomited blood:         Genitourinary    Burning when urinating:     Blood in urine:        Psychiatric    Major depression:  x       Hematologic    Bleeding problems:    Problems with blood clotting too easily:        Skin    Rashes or ulcers:        Constitutional    Fever or chills:      PHYSICAL EXAM: Vitals:   06/29/16 1010  BP: 136/78  Pulse: 66  Resp: 16  Temp: 98.6 F (37 C)  TempSrc: Oral  SpO2: 96%  Weight: 232 lb (105.2 kg)  Height: '6\' 1"'$  (1.854 m)    GENERAL: The patient is a well-nourished male, in no acute distress. The vital signs are documented above. CARDIAC: There is a regular rate and rhythm.  PULMONARY: There is good air exchange bilaterally without wheezing or  rales. MUSCULOSKELETAL: There are no major deformities or cyanosis. NEUROLOGIC: No focal weakness or paresthesias are detected. SKIN: There are no ulcers or rashes noted. PSYCHIATRIC: The patient has a normal affect.  DATA:  I have reviewed his CT angiogram with the following findings: Atherosclerosis thoracic aorta is noted with stable penetrating atherosclerotic ulcer seen arising from transverse aortic arch measuring 8 mm.  Interval development of opacity posteriorly in left lower lobe consistent with scarring or subsegmental atelectasis ; however, focal prominent area is seen more posteriorly and inferiorly within this abnormality measuring 25 x 16 mm ; underlying neoplasm cannot be excluded and follow-up CT scan in 3-4 weeks is recommended to ensure resolution or stability, and rule out potential underlying malignancy  MEDICAL ISSUES: Penetrating ulcer: Based on the size and lack of symptoms, I do not think the patient needs intervention currently.  If it does get to the point where surgery is required, he will most likely require a left carotid to subclavian bypass and stent graft repair.  I will continue to follow this.  I will repeat a CT scan in 1 year.  Lung nodule: The patient's CT scan showed areas of concern within his lungs.  I have ordered a CT scan of the chest in 6 weeks.  I have not scheduled the patient for follow-up but have instructed him to contact my office after the scan to that we can go over the results.    Annamarie Major, MD [facility] [phone] Pager [phone]

## 2016-07-01 ENCOUNTER — Encounter: Payer: Self-pay | Admitting: Surgery

## 2016-07-10 ENCOUNTER — Other Ambulatory Visit: Payer: Commercial Managed Care - HMO

## 2016-07-21 ENCOUNTER — Other Ambulatory Visit: Payer: Self-pay

## 2016-08-10 ENCOUNTER — Ambulatory Visit
Admission: RE | Admit: 2016-08-10 | Discharge: 2016-08-10 | Disposition: A | Discharge status: Home / Self Care | Payer: Commercial Managed Care - HMO | Source: Ambulatory Visit | Attending: Surgery | Admitting: Surgery

## 2016-08-10 ENCOUNTER — Ambulatory Visit: Payer: Commercial Managed Care - HMO | Admitting: Surgery

## 2016-08-10 ENCOUNTER — Other Ambulatory Visit: Payer: Self-pay

## 2016-08-10 ENCOUNTER — Other Ambulatory Visit: Payer: Self-pay | Admitting: *Deleted

## 2016-08-10 DIAGNOSIS — R911 Solitary pulmonary nodule: Secondary | ICD-10-CM

## 2016-08-10 DIAGNOSIS — R918 Other nonspecific abnormal finding of lung field: Secondary | ICD-10-CM | POA: Diagnosis not present

## 2016-08-10 IMAGING — CT CT CHEST W/O CM
2 of 4 series · 15 of 36 positions shown, 18 images · non-contrast
Comparison: Chest CT [DATE]

CLINICAL DATA: Followup pulmonary nodules.

EXAM:
CT CHEST WITHOUT CONTRAST
TECHNIQUE: Multidetector CT imaging of the chest was performed following the
standard protocol without IV contrast.

[Series 3: chest w/(date) · axial · 0.78mm/px · z∈[+629,+947]mm · 12 of 187 slices shown, 15 images]
[im 14/187  mediastinal]
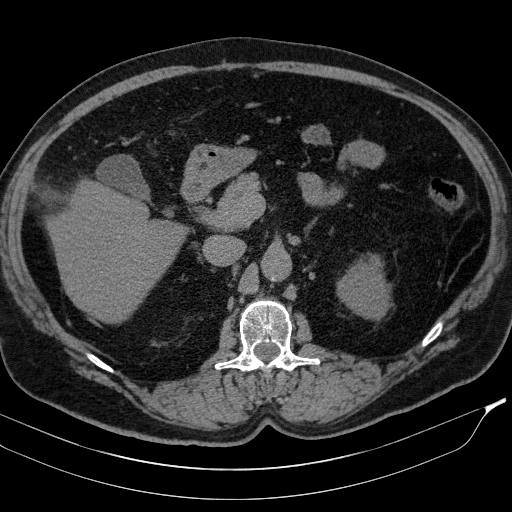
[im 14/187  lung]
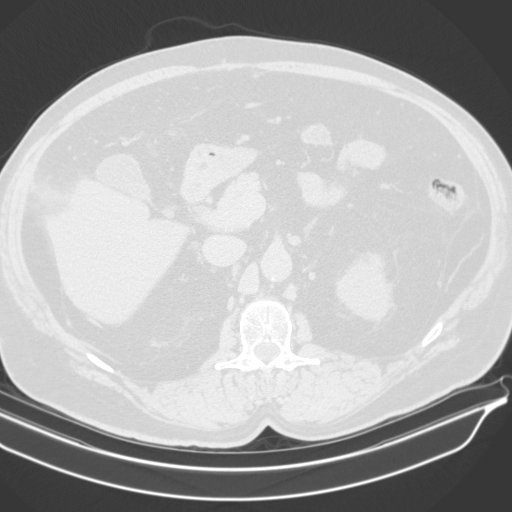
[im 27/187  lung]
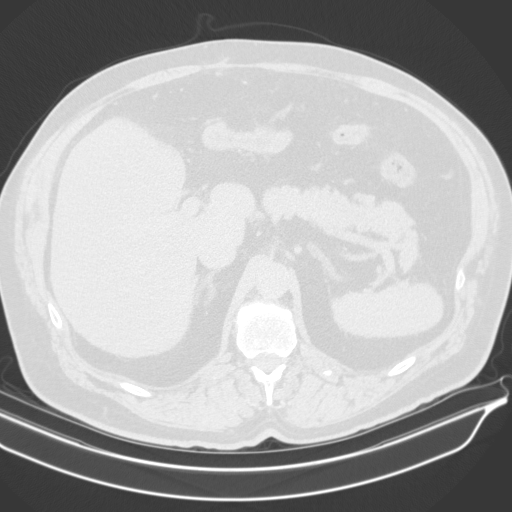
[im 40/187  lung]
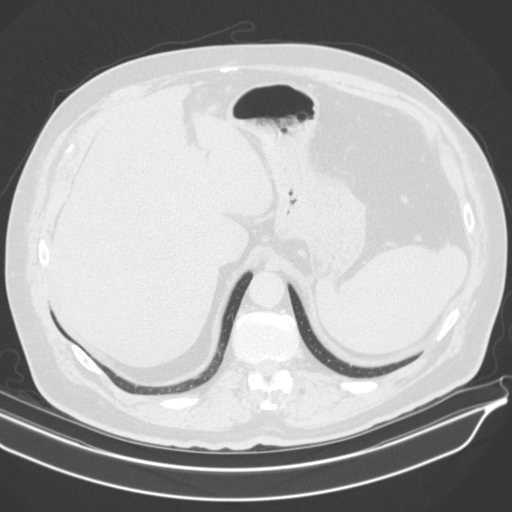
[im 54/187  lung]
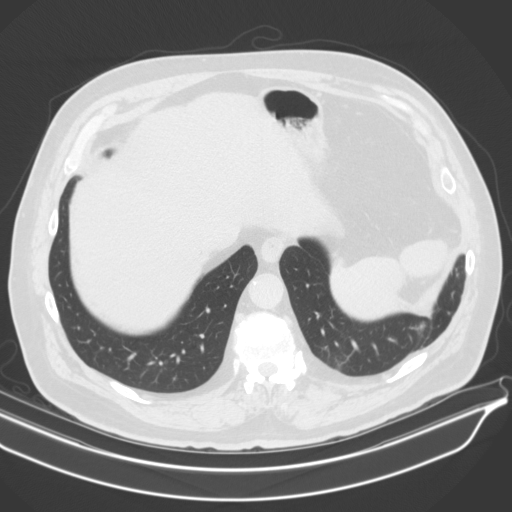
[im 67/187  mediastinal]
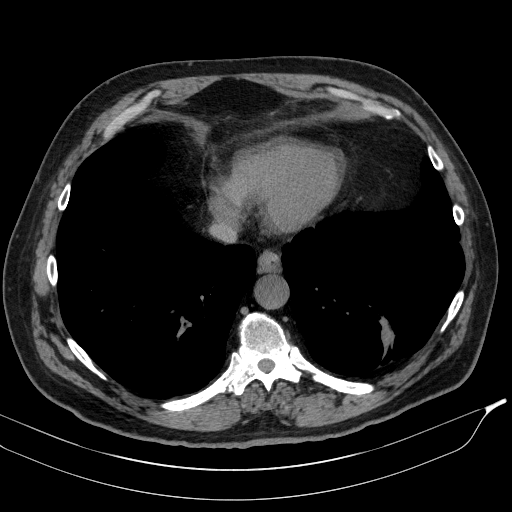
[im 67/187  lung]
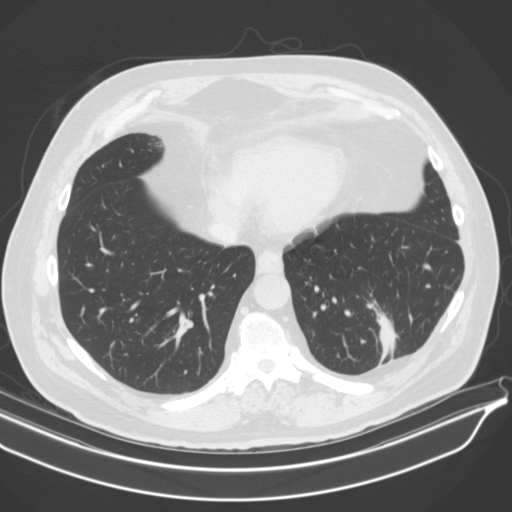
[im 80/187  lung]
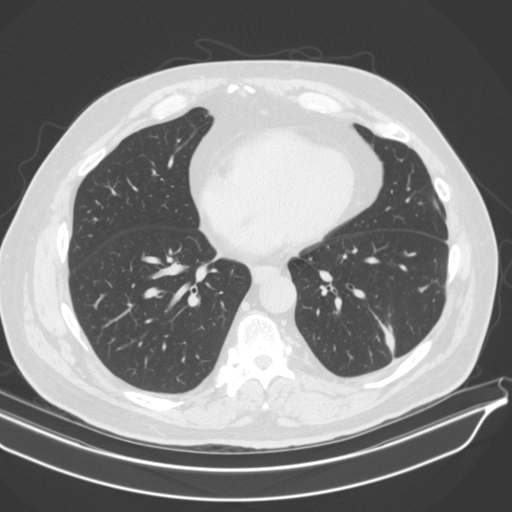
[im 107/187  lung]
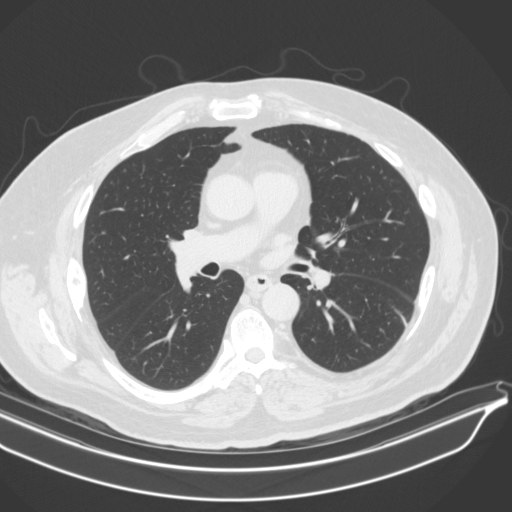
[im 120/187  lung]
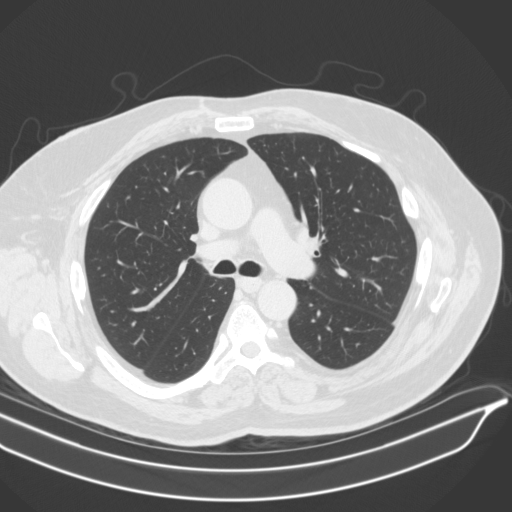
[im 133/187  mediastinal]
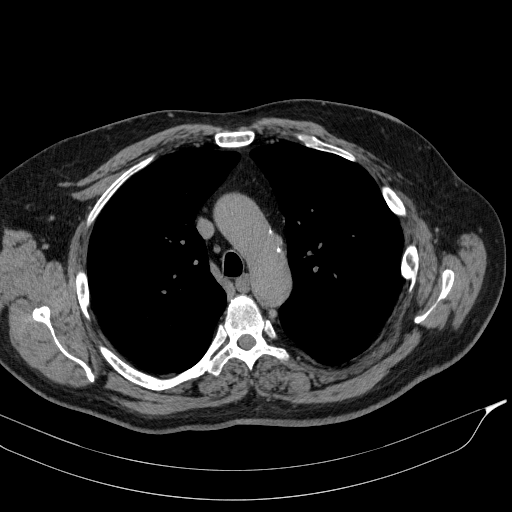
[im 133/187  lung]
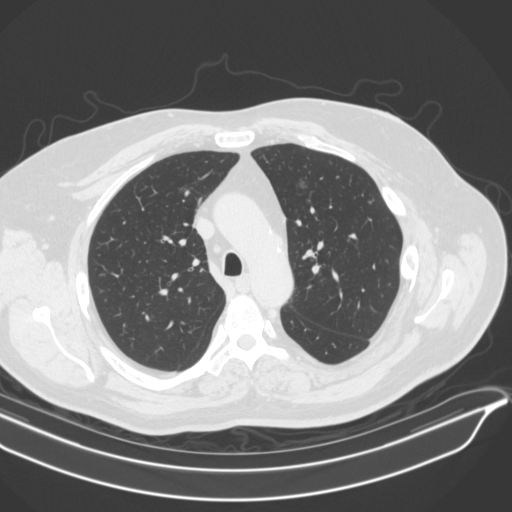
[im 147/187  lung]
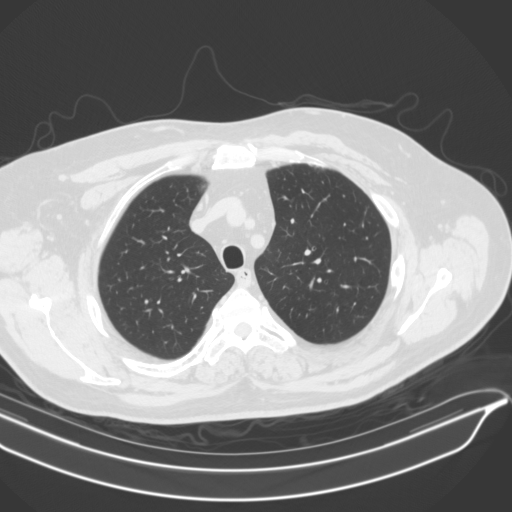
[im 160/187  lung]
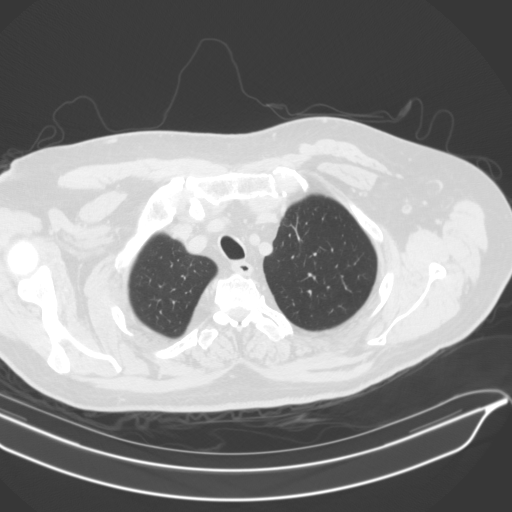
[im 173/187  lung]
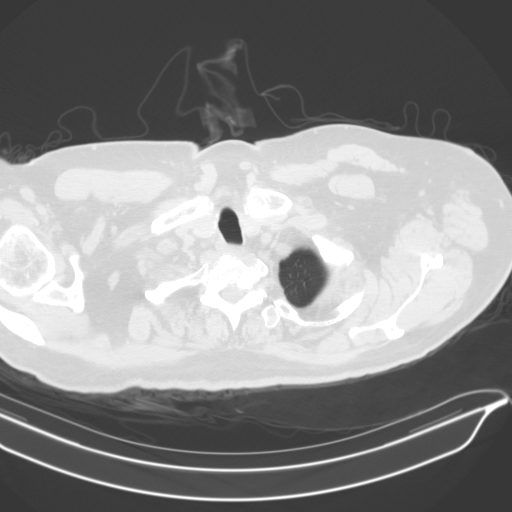

[Series 4: cor · coronal · 0.73mm/px · 3 of 150 slices shown]
[im 30/150  lung]
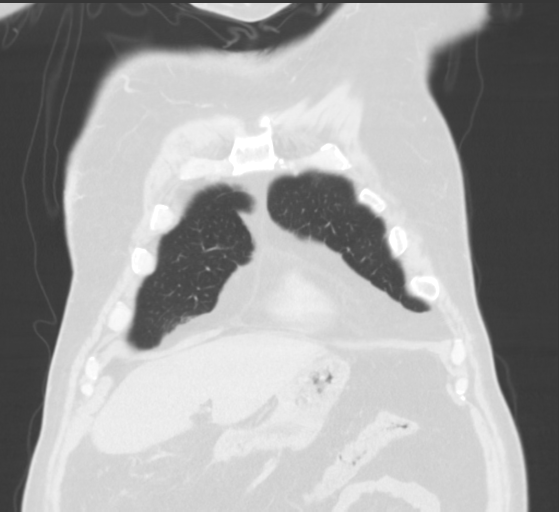
[im 60/150  lung]
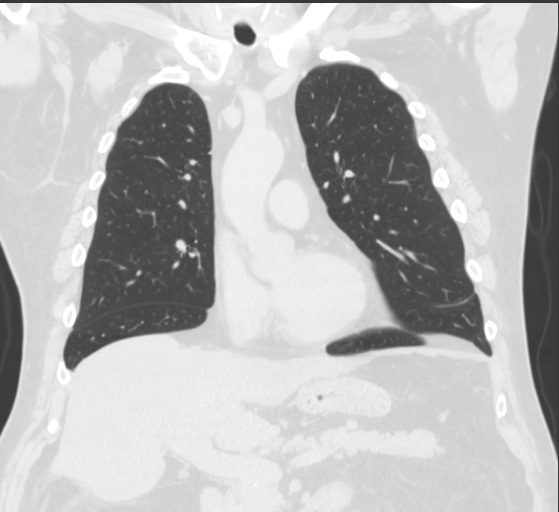
[im 90/150  lung]
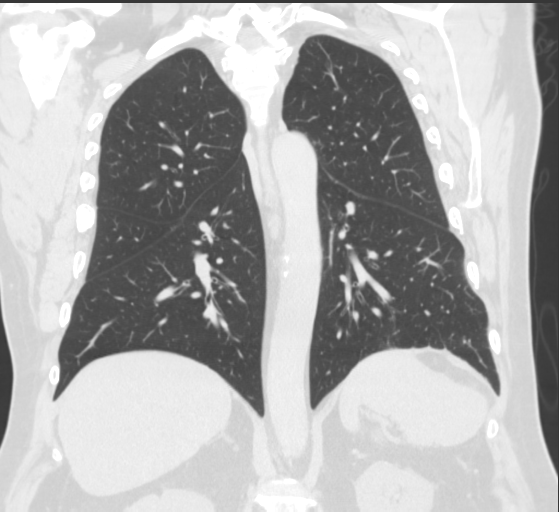

[15 of 36 positions shown; findings below may reference images not displayed]

FINDINGS: Chest wall: No chest wall mass, supraclavicular or axillary
lymphadenopathy. Small rim calcified left thyroid cyst.

Cardiovascular: The heart is normal in size. No pericardial
effusion. Scattered aortic and branch vessel calcifications but no
aneurysm.

Mediastinum/Nodes: Small scattered mediastinal and hilar lymph nodes
are stable. No mass or overt adenopathy. The esophagus is grossly
normal.

Lungs/Pleura: 11 mm left upper lobe ground-glass opacity appear
stable.

Stable left lower lobe scarring changes. No worrisome nodule or
mass. No infiltrates, edema or effusions.

Upper Abdomen: Minimal fluid noted around the knee liver. Suspect
cirrhosis. Stable low-attenuation lesion in the right hepatic lobe
posteriorly, likely a benign cyst. The gallbladder is normal.

Musculoskeletal: No significant bony findings.
IMPRESSION: 1. The left basilar changes are consistent with scarring. No
worrisome pulmonary lesions could in this area.
2. Left upper lobe ground-glass nodule appears stable since study
from 2 months ago but appears enlarged since [AJ]. Recommend
follow-up noncontrast chest CT in 6 months.

## 2016-09-08 DIAGNOSIS — H04123 Dry eye syndrome of bilateral lacrimal glands: Secondary | ICD-10-CM | POA: Diagnosis not present

## 2016-09-08 DIAGNOSIS — H2701 Aphakia, right eye: Secondary | ICD-10-CM | POA: Diagnosis not present

## 2016-09-08 DIAGNOSIS — H2512 Age-related nuclear cataract, left eye: Secondary | ICD-10-CM | POA: Diagnosis not present

## 2016-12-25 IMAGING — US US THYROID
1 series · 13 of 25 positions shown · non-contrast
Comparison: PET scan on [DATE]

CLINICAL DATA: Incidental on PET. Hypermetabolic and rim calcified
left thyroid nodule detected by PET scan.

EXAM:
THYROID ULTRASOUND
TECHNIQUE: Ultrasound examination of the thyroid gland and adjacent soft
tissues was performed.

[Series 1: us thyroid · 0.08mm/px · 13 of 40 slices shown]
[im 1/40]
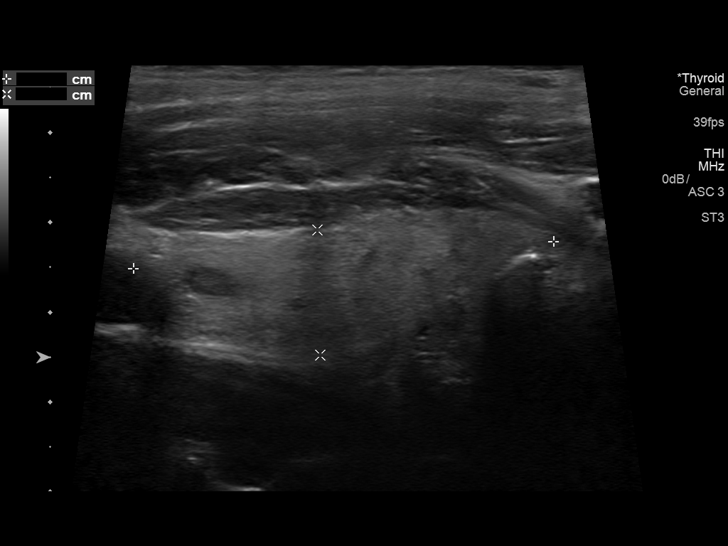
[im 4/40]
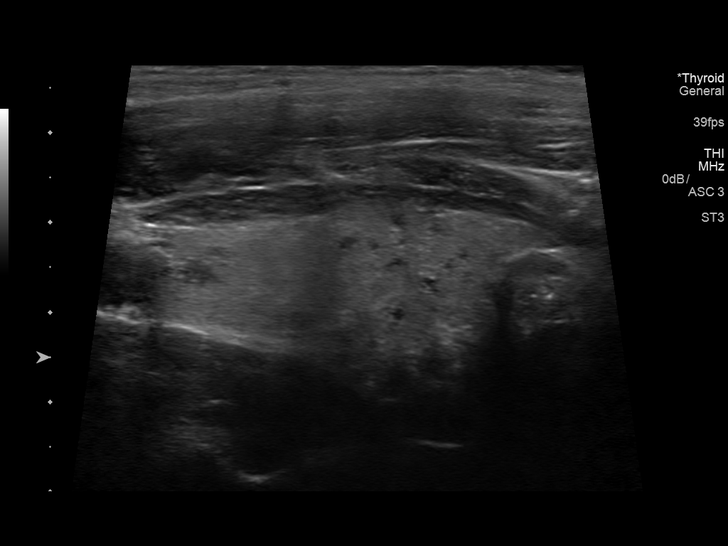
[im 7/40]
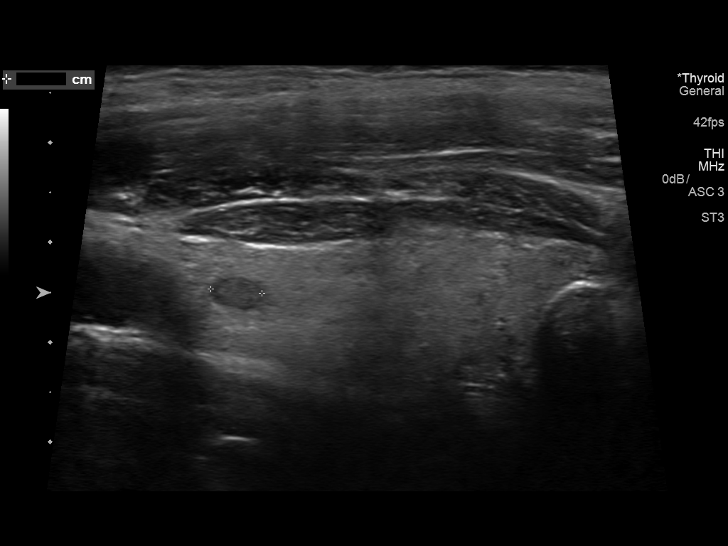
[im 10/40]
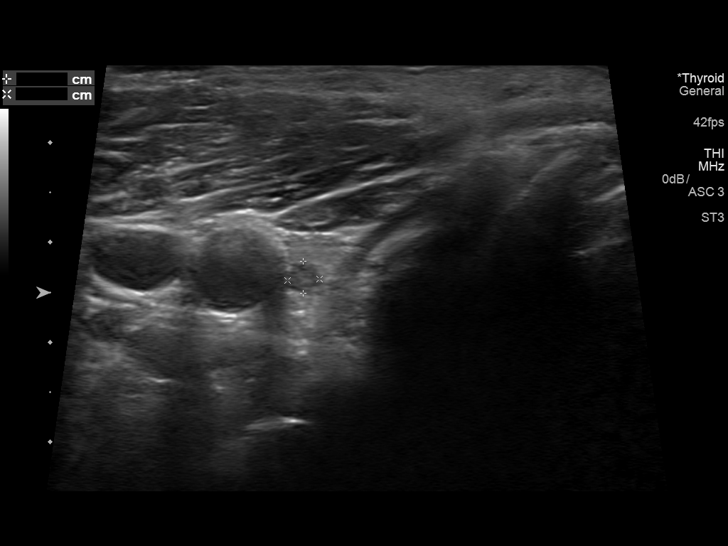
[im 14/40]
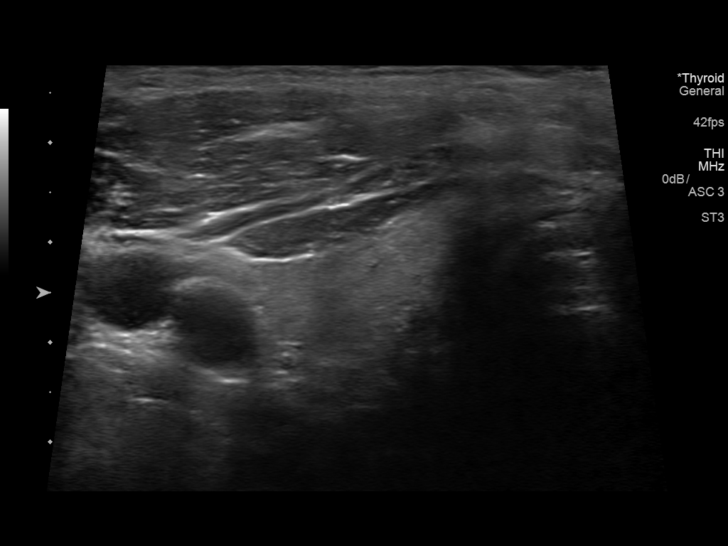
[im 17/40]
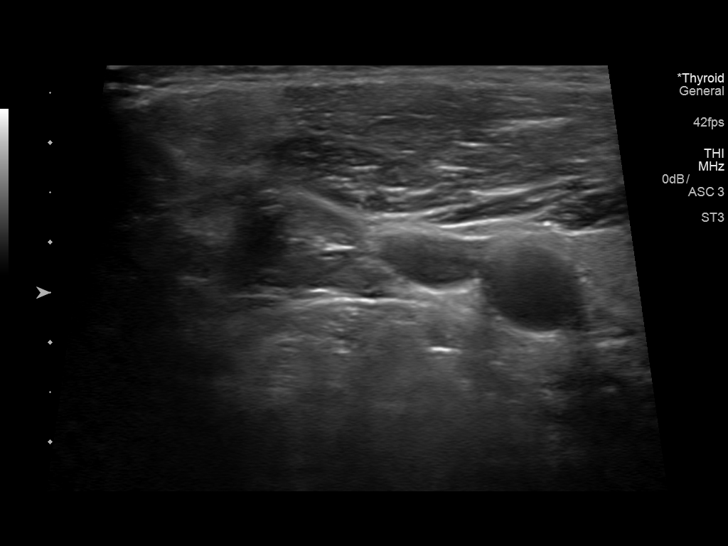
[im 20/40]
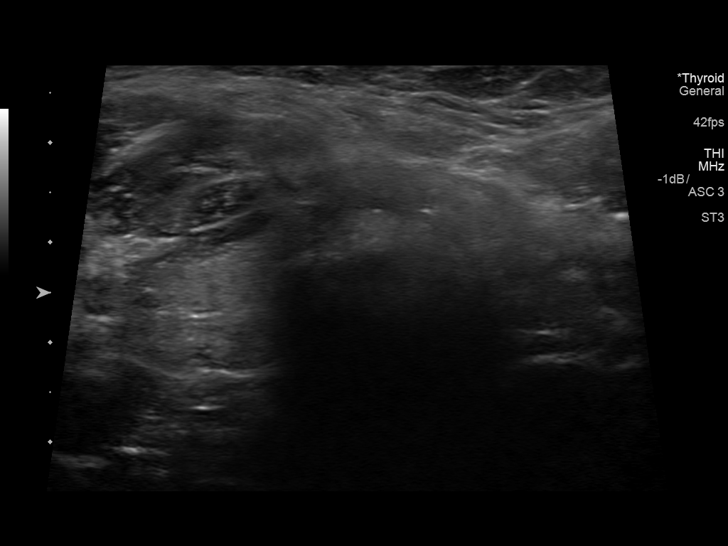
[im 23/40]
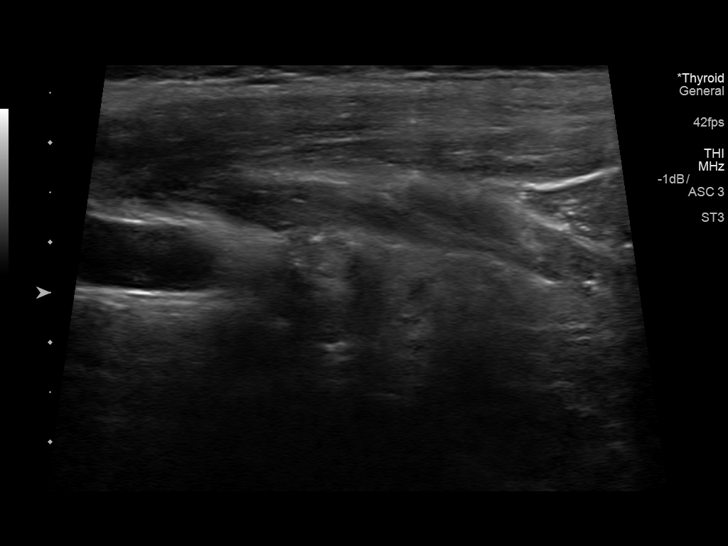
[im 27/40]
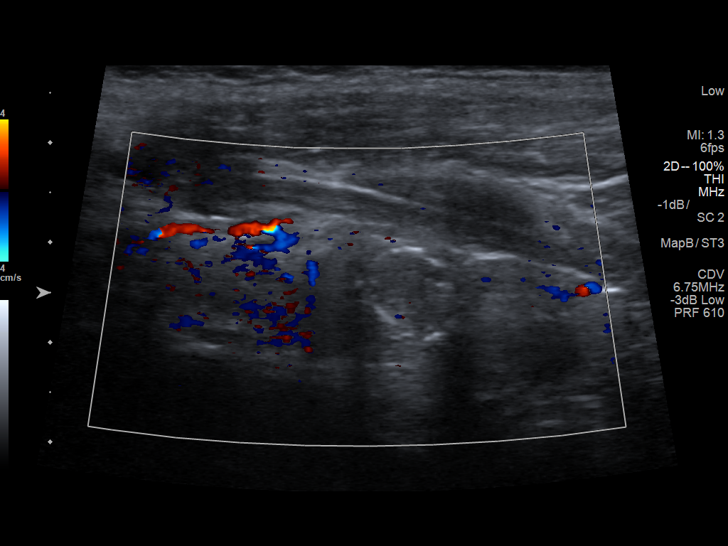
[im 30/40]
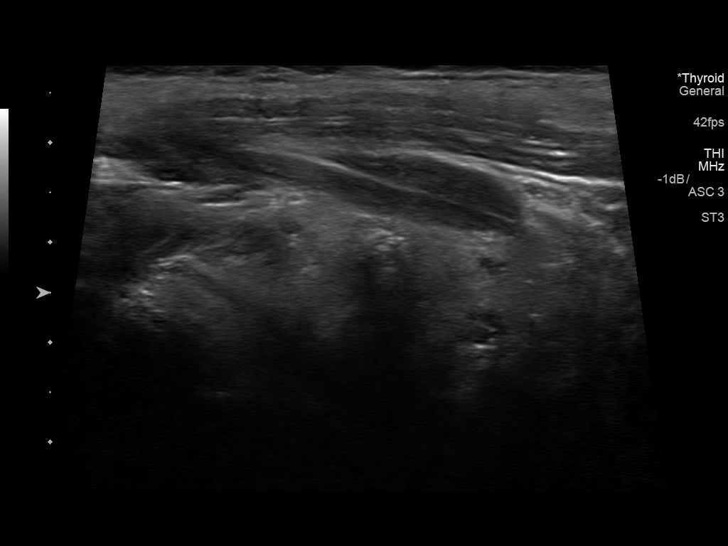
[im 33/40]
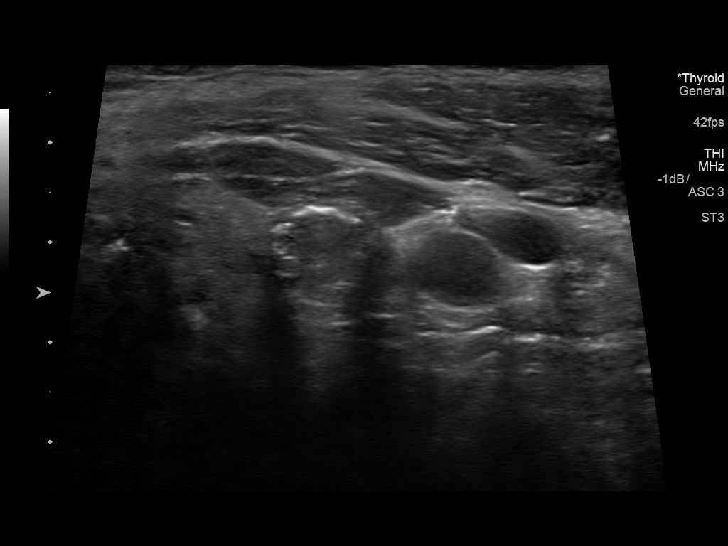
[im 36/40]
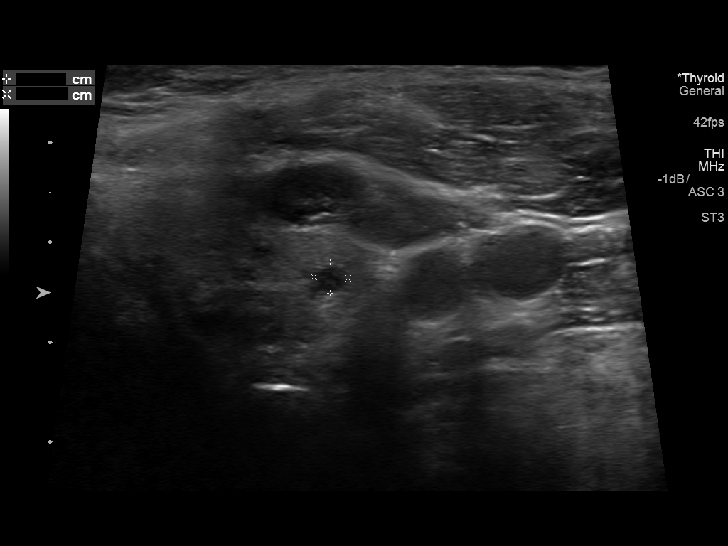
[im 40/40]
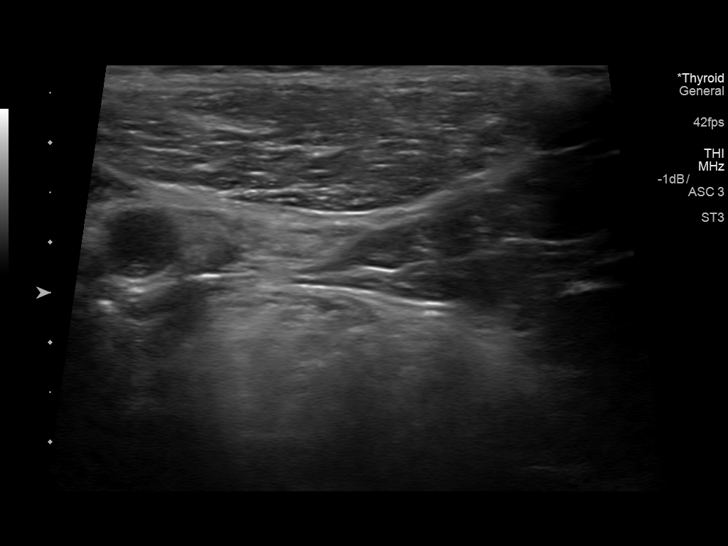

[13 of 25 positions shown; findings below may reference images not displayed]

FINDINGS: Parenchymal Echotexture: Mildly heterogenous

Isthmus: 0.2 cm

Right lobe: 4.7 x 1.4 x 2.0 cm

Left lobe: 4.7 x 1.3 x 2.1 cm

_________________________________________________________

Estimated total number of nodules >/= 1 cm: 1

Number of spongiform nodules >/=  2 cm not described below (TR1): 0

Number of mixed cystic and solid nodules >/= 1.5 cm not described
below (TR2): 0

_________________________________________________________

Nodule # 1:

Location: Left; Mid

Maximum size: 1.3 cm; Other 2 dimensions: 1.0 x 1.2 cm

Composition: solid/almost completely solid (2)

Echogenicity: hypoechoic (2)

Shape: not taller-than-wide (0)

Margins: lobulated/irregular (2)

Echogenic foci: peripheral calcifications (2)

ACR TI-RADS total points: 8.

ACR TI-RADS risk category: TR5 (>/= 7 points).

ACR TI-RADS recommendations:

**Given size (>/= 1.0 cm) and appearance, fine needle aspiration of
this highly suspicious nodule should be considered based on TI-RADS
criteria.

_________________________________________________________

Nodule # 2:

Location: Right; Superior

Maximum size: 0.5 cm; Other 2 dimensions: 0.3 x 0.3 cm

Composition: solid/almost completely solid (2)

Echogenicity: hypoechoic (2)

Shape: not taller-than-wide (0)

Margins: smooth (0)

Echogenic foci: none (0)

ACR TI-RADS total points: 4.

ACR TI-RADS risk category: TR4 (4-6 points).

ACR TI-RADS recommendations:

Given size (<0.9 cm) and appearance, this nodule does NOT meet
TI-RADS criteria for biopsy or dedicated follow-up.

_________________________________________________________

0.4 cm cyst in the inferior left lobe is benign.

No abnormal lymph nodes identified.
IMPRESSION: 1.3 cm mid left thyroid nodule demonstrating peripheral
calcification and lobulated contour. This nodule meets TI-RADS
criteria for fine-needle aspiration. A small 0.5 cm right superior
nodule does not meet criteria for biopsy or further follow-up.

The above is in keeping with the ACR TI-RADS recommendations - [HOSPITAL] [[facility]];[DATE].

## 2017-02-10 DIAGNOSIS — E7800 Pure hypercholesterolemia, unspecified: Secondary | ICD-10-CM | POA: Diagnosis not present

## 2017-02-10 DIAGNOSIS — F321 Major depressive disorder, single episode, moderate: Secondary | ICD-10-CM | POA: Diagnosis not present

## 2017-02-10 DIAGNOSIS — Z79899 Other long term (current) drug therapy: Secondary | ICD-10-CM | POA: Diagnosis not present

## 2017-02-10 DIAGNOSIS — I10 Essential (primary) hypertension: Secondary | ICD-10-CM | POA: Diagnosis not present

## 2017-02-10 DIAGNOSIS — E559 Vitamin D deficiency, unspecified: Secondary | ICD-10-CM | POA: Diagnosis not present

## 2017-02-10 DIAGNOSIS — Z125 Encounter for screening for malignant neoplasm of prostate: Secondary | ICD-10-CM | POA: Diagnosis not present

## 2017-02-10 DIAGNOSIS — Z8719 Personal history of other diseases of the digestive system: Secondary | ICD-10-CM | POA: Diagnosis not present

## 2017-02-10 DIAGNOSIS — F419 Anxiety disorder, unspecified: Secondary | ICD-10-CM | POA: Diagnosis not present

## 2017-02-10 DIAGNOSIS — F17210 Nicotine dependence, cigarettes, uncomplicated: Secondary | ICD-10-CM | POA: Diagnosis not present

## 2017-02-10 DIAGNOSIS — Z0001 Encounter for general adult medical examination with abnormal findings: Secondary | ICD-10-CM | POA: Diagnosis not present

## 2017-02-10 DIAGNOSIS — I719 Aortic aneurysm of unspecified site, without rupture: Secondary | ICD-10-CM | POA: Diagnosis not present

## 2017-02-10 DIAGNOSIS — R5383 Other fatigue: Secondary | ICD-10-CM | POA: Diagnosis not present

## 2017-03-24 DIAGNOSIS — H47211 Primary optic atrophy, right eye: Secondary | ICD-10-CM | POA: Diagnosis not present

## 2017-06-17 DIAGNOSIS — M25521 Pain in right elbow: Secondary | ICD-10-CM | POA: Diagnosis not present

## 2017-06-17 DIAGNOSIS — R739 Hyperglycemia, unspecified: Secondary | ICD-10-CM | POA: Diagnosis not present

## 2017-06-17 DIAGNOSIS — M7989 Other specified soft tissue disorders: Secondary | ICD-10-CM | POA: Diagnosis not present

## 2017-06-17 DIAGNOSIS — M25421 Effusion, right elbow: Secondary | ICD-10-CM | POA: Diagnosis not present

## 2017-06-17 DIAGNOSIS — S56911A Strain of unspecified muscles, fascia and tendons at forearm level, right arm, initial encounter: Secondary | ICD-10-CM | POA: Diagnosis not present

## 2017-06-22 ENCOUNTER — Encounter: Payer: Self-pay | Admitting: Surgery

## 2017-06-22 ENCOUNTER — Other Ambulatory Visit: Payer: Commercial Managed Care - HMO

## 2017-06-24 DIAGNOSIS — Z87891 Personal history of nicotine dependence: Secondary | ICD-10-CM | POA: Diagnosis not present

## 2017-06-24 DIAGNOSIS — M25521 Pain in right elbow: Secondary | ICD-10-CM | POA: Diagnosis not present

## 2017-06-24 DIAGNOSIS — J449 Chronic obstructive pulmonary disease, unspecified: Secondary | ICD-10-CM | POA: Diagnosis not present

## 2017-06-24 DIAGNOSIS — F17200 Nicotine dependence, unspecified, uncomplicated: Secondary | ICD-10-CM | POA: Diagnosis not present

## 2017-06-24 DIAGNOSIS — M25421 Effusion, right elbow: Secondary | ICD-10-CM | POA: Diagnosis not present

## 2017-06-24 DIAGNOSIS — L309 Dermatitis, unspecified: Secondary | ICD-10-CM | POA: Diagnosis not present

## 2017-06-28 ENCOUNTER — Other Ambulatory Visit: Payer: Self-pay | Admitting: *Deleted

## 2017-06-28 ENCOUNTER — Ambulatory Visit
Admission: RE | Admit: 2017-06-28 | Discharge: 2017-06-28 | Disposition: A | Discharge status: Home / Self Care | Payer: Medicare HMO | Source: Ambulatory Visit | Attending: Surgery | Admitting: Surgery

## 2017-06-28 DIAGNOSIS — R918 Other nonspecific abnormal finding of lung field: Secondary | ICD-10-CM

## 2017-06-28 DIAGNOSIS — I700 Atherosclerosis of aorta: Secondary | ICD-10-CM

## 2017-06-28 DIAGNOSIS — I714 Abdominal aortic aneurysm, without rupture: Secondary | ICD-10-CM | POA: Diagnosis not present

## 2017-06-28 IMAGING — CT CT ANGIO CHEST
2 of 5 series · 9 of 30 positions shown · IV contrast (75CC ISOVUE 370)
Comparison: Prior CT scan of the chest [DATE] and [DATE]

CLINICAL DATA: 61-year-old male with a history of penetrating
aortic ulcer.

EXAM:
CT ANGIOGRAPHY CHEST WITH CONTRAST
TECHNIQUE: Multidetector CT imaging of the chest was performed using the
standard protocol during bolus administration of intravenous
contrast. Multiplanar CT image reconstructions and MIPs were
obtained to evaluate the vascular anatomy.
Creatinine was obtained on site at [HOSPITAL] at [REDACTED].
Results: Creatinine 1.0 mg/dL.
CONTRAST:  75 mL Isovue 370

[Series 3: angio · axial · 0.80mm/px · z∈[-262,-60]mm · 4 of 137 slices shown]
[im 28/137  lung]
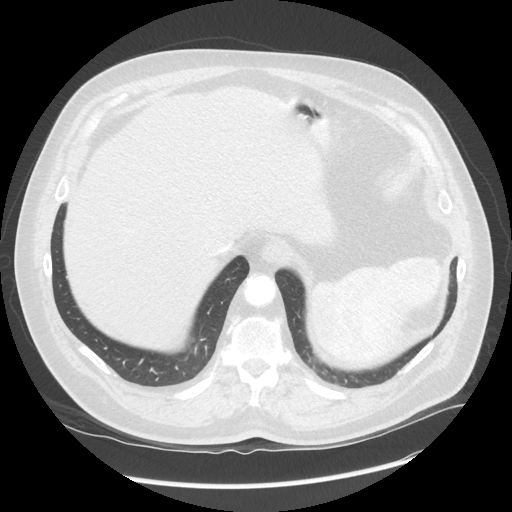
[im 55/137  mediastinal]
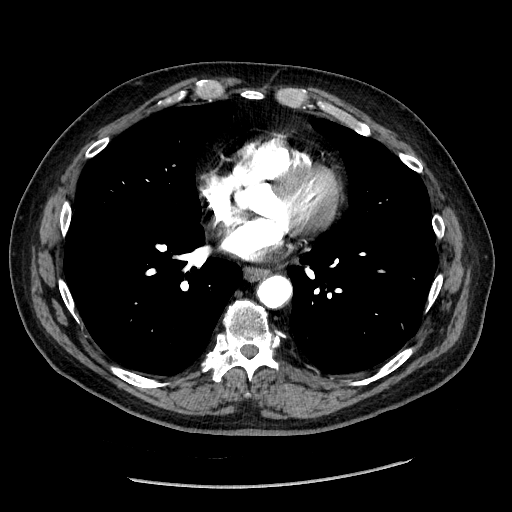
[im 82/137  lung]
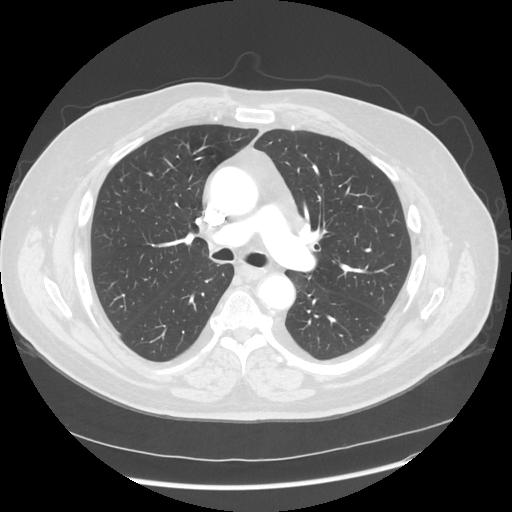
[im 109/137  mediastinal]
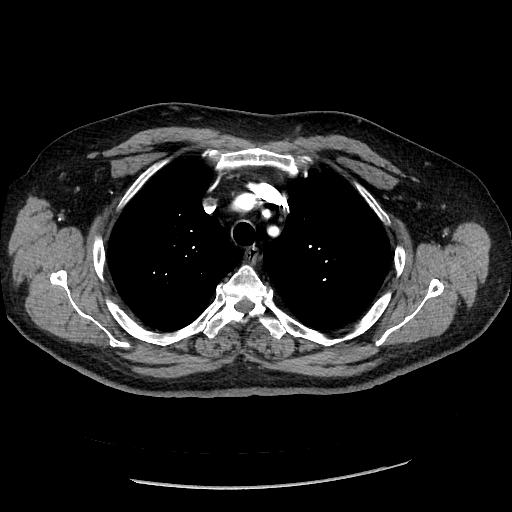

[Series 602: sagittal body · sagittal · 0.80mm/px · 5 of 164 slices shown]
[im 28/164  lung]
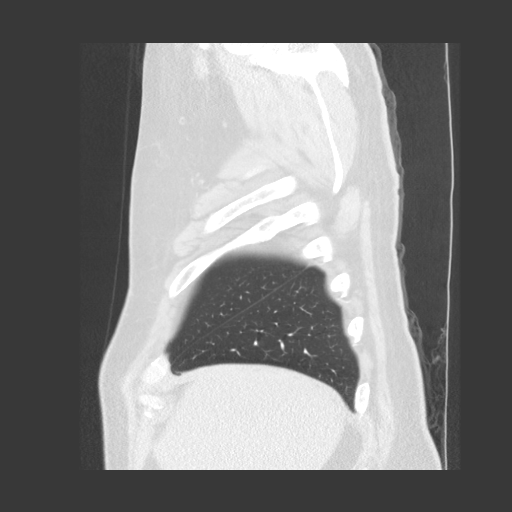
[im 55/164  lung]
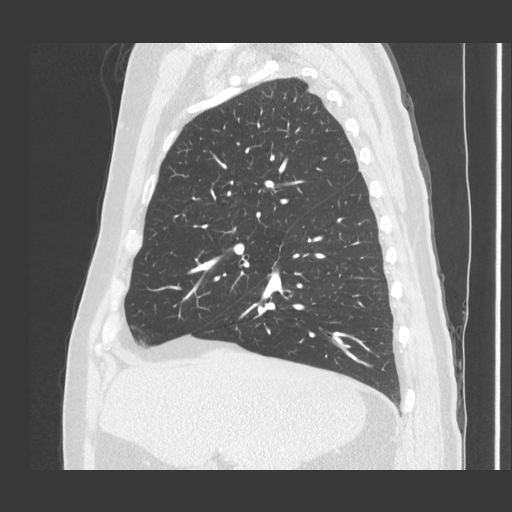
[im 82/164  lung]
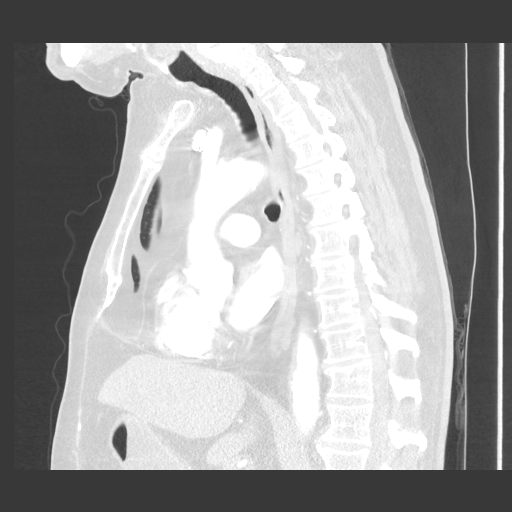
[im 109/164  lung]
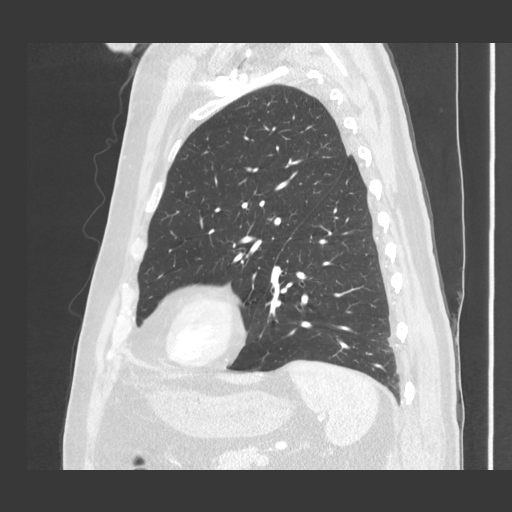
[im 136/164  lung]
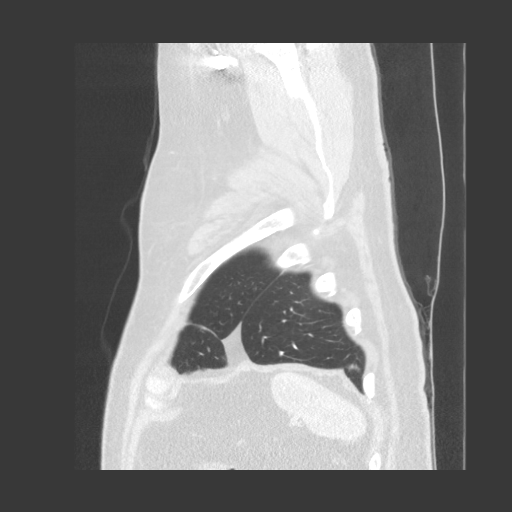

[9 of 30 positions shown; findings below may reference images not displayed]

FINDINGS: Cardiovascular: Stable small penetrating atherosclerotic ulcer
arising from the right lateral aspect of the aortic infundibulum.
The ulceration itself measures approximately 8 mm in diameter. The
diameter of the aorta at the site measures 3 cm in diameter,
unchanged. Mild ectasia of the tubular portion of the ascending
thoracic aorta with a maximal diameter of 3.6 cm. The descending
aorta is normal in caliber. The heart is normal in size. No central
pulmonary embolism or evidence of pulmonary artery enlargement. No
pericardial effusion.

Mediastinum/Nodes: Unremarkable CT appearance of the thyroid gland.
No suspicious mediastinal or hilar adenopathy. No soft tissue
mediastinal mass. The thoracic esophagus is unremarkable.

Lungs/Pleura: Ground-glass attenuation airspace opacity in the left
upper lobe presently measures 0.9 x 0.6 cm compared to 5.0 cm on
[DATE] and [DATE]. Interval growth is concerning for low
grade adenocarcinoma. No additional pulmonary lesions are
identified. No evidence of pneumonia, pulmonary edema, pleural
effusion or pneumothorax.

Upper Abdomen: Visualized upper abdominal organs are unremarkable.

Musculoskeletal: No acute fracture or aggressive appearing lytic or
blastic osseous lesion.

Review of the MIP images confirms the above findings.
IMPRESSION: VASCULAR

1. Stable small penetrating atherosclerotic ulcer arising from the
aortic infundibulum.
2. Stable ectasia of the tubular portion of the ascending thoracic
aorta with a maximal diameter of 3.6 cm.
3.  Aortic Atherosclerosis ([B3]-170.0) .

NON VASCULAR

1. Enlarging ground-glass attenuation airspace opacity in the left
upper lobe presently measuring 0.9 x 0.6 cm. Interval enlargement
compared to [DATE] and [DATE] is concerning for low grade
adenocarcinoma, or adenocarcinoma in situ. The nodule does not
demonstrate a significant solid or semi-solid component and may be
below the size threshold for adequate evaluation by PET-CT. Consider
referral to the multi disciplinary tumor oncology clinic (MTOC).
These results will be called to the ordering clinician or
representative by the Radiologist Assistant, and communication
documented in the PACS or zVision Dashboard.

## 2017-06-28 MED ORDER — IOPAMIDOL (ISOVUE-370) INJECTION 76%
75.0000 mL | Freq: Once | INTRAVENOUS | Status: AC | PRN
  Administered 2017-06-28: 75 mL via INTRAVENOUS

## 2017-06-28 NOTE — Progress Notes (Signed)
Received a call from [place] at [facility] radiology regarding a stat report on this patient's CTA Chest done today for followup on his aortic atherosclerotic ulcer.   CT ANGIO CHEST AORTA W &/OR WO CONTRAST (Accession [id number]) (Order [id number])   CLINICAL DATA:  61 year old male with a history of penetrating aortic ulcer.  COMPARISON:  Prior CT scan of the chest 08/10/2016 and 06/08/2016  FINDINGS: Cardiovascular: Stable small penetrating atherosclerotic ulcer arising from the right lateral aspect of the aortic infundibulum. The ulceration itself measures approximately 8 mm in diameter. The diameter of the aorta at the site measures 3 cm in diameter, unchanged. Mild ectasia of the tubular portion of the ascending thoracic aorta with a maximal diameter of 3.6 cm. The descending aorta is normal in caliber. The heart is normal in size. No central pulmonary embolism or evidence of pulmonary artery enlargement. No pericardial effusion.  Mediastinum/Nodes: Unremarkable CT appearance of the thyroid gland. No suspicious mediastinal or hilar adenopathy. No soft tissue mediastinal mass. The thoracic esophagus is unremarkable.  Lungs/Pleura: Ground-glass attenuation airspace opacity in the left upper lobe presently measures 0.9 x 0.6 cm compared to 5.0 cm on 06/08/2016 and 11/27/2015. Interval growth is concerning for low grade adenocarcinoma. No additional pulmonary lesions are identified. No evidence of pneumonia, pulmonary edema, pleural effusion or pneumothorax. Upper Abdomen: Visualized upper abdominal organs are unremarkable. Musculoskeletal: No acute fracture or aggressive appearing lytic or blastic osseous lesion. Review of the MIP images confirms the above findings.  IMPRESSION: VASCULAR 1. Stable small penetrating atherosclerotic ulcer arising from the aortic infundibulum. 2. Stable ectasia of the tubular portion of the ascending thoracic aorta with a maximal diameter of  3.6 cm. 3.  Aortic Atherosclerosis (ICD10-170.0) .  NON VASCULAR 1. Enlarging ground-glass attenuation airspace opacity in the left upper lobe presently measuring 0.9 x 0.6 cm. Interval enlargement compared to 06/08/2016 and 11/27/2015 is concerning for low grade adenocarcinoma, or adenocarcinoma in situ. The nodule does not demonstrate a significant solid or semi-solid component and may be below the size threshold for adequate evaluation by PET-CT. Consider referral to the multi disciplinary tumor oncology clinic [facility]). These results will be called to the ordering clinician or representative by the Radiologist Assistant, and communication documented in the PACS or zVision Dashboard.  Signed, Criselda Peaches, MD     I reviewed this CTA Chest with Dr. Trula Slade, and said to notify Drs Dorthy Cooler and Annamaria Boots and refer the patient to the [facility]. Referrals put in EPIC.

## 2017-06-28 NOTE — Progress Notes (Signed)
Patient has been notified of the need to have a more in depth workup for this lung lesion. He is in agreement with the care plan and will attend the appts.

## 2017-06-29 DIAGNOSIS — M25421 Effusion, right elbow: Secondary | ICD-10-CM

## 2017-06-29 HISTORY — DX: Effusion, right elbow: M25.421

## 2017-06-30 ENCOUNTER — Telehealth: Payer: Self-pay | Admitting: *Deleted

## 2017-06-30 NOTE — Telephone Encounter (Signed)
-----   Message from [place], [place] sent at 06/30/2017 10:29 AM EDT ----- Regarding: RE: Question That is what I thought as well. I am not sure how to add them to conference for [facility] either. I will forward his info to Norton Blizzard who is the thoracic navigator at [place]. She can get in touch with him and schedule him in the thoracic clinic at [facility].   Have a great day! -Hayley ----- Message ----- From: Mena Goes, RN Sent: 06/30/2017  10:07 AM To: Telford Nab, RN Subject: RE: Question                                   I would imagine [place] would be best. I really didn't know how to initiate the conference presentation. Patient is aware that he is going to have a more extensive evaluation of this mass.  ----- Message ----- From: Telford Nab, RN Sent: 06/30/2017   9:46 AM To: Serafina Mitchell, MD, Mena Goes, RN Subject: Question                                       Lauraine Rinne,   I am one of the nurse navigators at the cancer center at [facility]. This patient is scheduled to be discussed at our tumor conference tomorrow. I was wondering if this patient wants to be seen at [facility] or [place]? I saw that he lives in [place]. If he wants to be seen in [place] then I will forward his info to [place] who is the nurse navigator at [facility]. We do not need to present him if he isn't going to be seen here.   Just let me know when you get a chance. Thanks,   Angie Fava

## 2017-06-30 NOTE — Telephone Encounter (Addendum)
Patient is aware of the need for this appt. And Dr. Trula Slade is to discuss this CT Chest with him at 07/12/17 appt in our office.

## 2017-07-05 ENCOUNTER — Ambulatory Visit (INDEPENDENT_AMBULATORY_CARE_PROVIDER_SITE_OTHER): Payer: Medicare HMO | Admitting: Emergency Medicine

## 2017-07-05 ENCOUNTER — Other Ambulatory Visit: Payer: Self-pay | Admitting: *Deleted

## 2017-07-05 VITALS — BP 118/80 | HR 76 | Ht 72.0 in | Wt 228.0 lb

## 2017-07-05 DIAGNOSIS — J449 Chronic obstructive pulmonary disease, unspecified: Secondary | ICD-10-CM | POA: Insufficient documentation

## 2017-07-05 DIAGNOSIS — C3492 Malignant neoplasm of unspecified part of left bronchus or lung: Secondary | ICD-10-CM

## 2017-07-05 DIAGNOSIS — R911 Solitary pulmonary nodule: Secondary | ICD-10-CM | POA: Diagnosis not present

## 2017-07-05 HISTORY — DX: Malignant neoplasm of unspecified part of left bronchus or lung: C34.92

## 2017-07-05 MED ORDER — TIOTROPIUM BROMIDE MONOHYDRATE 2.5 MCG/ACT IN AERS
2.0000 | INHALATION_SPRAY | Freq: Every day | RESPIRATORY_TRACT | 0 refills | Status: DC
Start: 2017-07-05 — End: 2017-10-01

## 2017-07-05 NOTE — Addendum Note (Signed)
Addended by: Lorretta Harp on: 07/05/2017 03:47 PM   Modules accepted: Orders

## 2017-07-05 NOTE — Patient Instructions (Signed)
We will arrange for pulmonary function testing to compare with 2016 Go ahead and get your PET scan next week as planned Depending on the PET, we will likely refer you to Thoracic Clinic to discuss surgical removal of the nodule We will stop Advair for now.  Start Spiriva 2 puffs once a day to see if you benefit. If so we can order through your pharmacy.  Take albuterol 2 puffs up to every 4 hours if needed for shortness of breath.  Follow with Dr Lamonte Sakai in 1 month.

## 2017-07-05 NOTE — Progress Notes (Signed)
Subjective:    Patient ID: Timothy Conway, male    DOB: October 12, 1956, 61 y.o.   MRN: [id number]  HPI 61 year old active smoker (40 pack years) with a history of hypertension, hyperlipidemia and presumed COPD. He is also followed by Dr. Trula Slade for an arch penetrating ulcer that has been noted on CT scan of the chest. Also on his serial CTs we've been following a slowly enlarging left upper lobe groundglass nodule. On his most recent CT scan from 06/28/17 that I have personally reviewed the groundglass nodule has increased in size to 0.9 x 0.6 cm, up from 0.5 cm. There is no solid component or semisolid component seen. He is referred for evaluation of the nodule. As mentioned he has been given a diagnosis of COPD, had pulmonary function testing 11/07/15 that I reviewed. This showed mixed obstruction and restriction with moderately severely reduced FEV1, grossly normal diffusion capacity.  He is on Advair 250/50, albuterol as needed.   Review of Systems  Constitutional: Negative for fever and unexpected weight change.  HENT: Negative for congestion, dental problem, ear pain, nosebleeds, postnasal drip, rhinorrhea, sinus pressure, sneezing, sore throat and trouble swallowing.   Eyes: Negative for redness and itching.  Respiratory: Positive for cough and shortness of breath. Negative for chest tightness and wheezing.   Cardiovascular: Negative for palpitations and leg swelling.  Gastrointestinal: Negative for nausea and vomiting.  Genitourinary: Negative for dysuria.  Musculoskeletal: Negative for joint swelling.  Skin: Negative for rash.  Neurological: Negative for headaches.  Hematological: Does not bruise/bleed easily.  Psychiatric/Behavioral: Negative for dysphoric mood. The patient is not nervous/anxious.    Past Medical History:  Diagnosis Date  . Depression   . Gout   . Hyperlipidemia   . Hypertension      Family History  Problem Relation Age of Onset  . Adopted: Yes      Social History   Social History  . Marital status: Single    Spouse name: N/A  . Number of children: N/A  . Years of education: N/A   Occupational History  . Not on file.   Social History Main Topics  . Smoking status: Current Every Day Smoker    Packs/day: 1.00    Years: 40.00    Types: Cigarettes  . Smokeless tobacco: Never Used  . Alcohol use No  . Drug use: Unknown  . Sexual activity: Not on file   Other Topics Concern  . Not on file   Social History Narrative  . 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  No TB exposure or positive skin testing.   No Known Allergies   Outpatient Medications Prior to Visit  Medication Sig Dispense Refill  . ADVAIR DISKUS 250-50 MCG/DOSE AEPB     . ALPRAZolam (XANAX) 1 MG tablet Take 1 mg by mouth at bedtime as needed for anxiety (per Dr Dorthy Cooler medication list take 1 tablet 5-6 times daily as needed). Per health/medication list sent by Dr Dorthy Cooler    . ARIPiprazole (ABILIFY) 5 MG tablet Take 5 mg by mouth daily.    Marland Kitchen aspirin 81 MG tablet Take 81 mg by mouth daily.    Marland Kitchen atenolol (TENORMIN) 100 MG tablet Take 100 mg by mouth 2 (two) times daily.    Marland Kitchen co-enzyme Q-10 30 MG capsule     . doxepin (SINEQUAN) 50 MG  capsule Take 4 capsules by mouth daily.    Marland Kitchen gemfibrozil (LOPID) 600 MG tablet Take 600 mg by mouth 2 (two) times daily before a meal.    . hydrocortisone (ANUSOL-HC) 2.5 % rectal cream Place 1 application rectally every 30 (thirty) days. Application to affected area as needed one week a month    . ketoconazole (NIZORAL) 2 % shampoo Apply 1 application topically 2 (two) times a week.    . Omega-3 Fatty Acids (FISH OIL) 1200 MG CAPS Take 1,200 mg by mouth daily.    . pravastatin (PRAVACHOL) 20 MG tablet Take 20 mg by mouth daily.    . VENTOLIN HFA 108 (90 Base) MCG/ACT inhaler Reported on  12/30/2015  11   No facility-administered medications prior to visit.         Objective:   Physical Exam Vitals:   07/05/17 1507 07/05/17 1508  BP:  118/80  Pulse:  76  SpO2:  97%  Weight: 228 lb (103.4 kg)   Height: 6' (1.829 m)    Gen: Pleasant, well-nourished, in no distress, somewhat anxious but appropriate  ENT: No lesions,  mouth clear,  oropharynx clear, no postnasal drip  Neck: No JVD, no TMG, no carotid bruits  Lungs: No use of accessory muscles, clear without rales or rhonchi, wheeze on a forced expiration  Cardiovascular: RRR, heart sounds normal, no murmur or gallops, no peripheral edema  Musculoskeletal: No deformities, no cyanosis or clubbing  Neuro: alert, non focal  Skin: Warm, no lesions or rashes       Assessment & Plan:  Pulmonary nodule, left Slowly enlarging left upper lobe groundglass pulmonary nodule. This likely does reflect a slow-growing highly differentiated adenocarcinoma. He has no risk factors for TB or other etiologies. I believe best plan would be primary resection if he is a good candidate. Need to ensure that his PET scan is negative for metastasis, that he can surgery. We will perform PFT.  COPD (chronic obstructive pulmonary disease) (HCC) Likely undertreated on Advair. He is not sure that he has good control on this medication. He doesn't have an albuterol at home. I like to do a trial of Spiriva to see if he benefits more.   Baltazar Apo, MD, PhD 07/05/2017, 3:43 PM [facility] [phone] or if no answer [phone]

## 2017-07-05 NOTE — Assessment & Plan Note (Signed)
Slowly enlarging left upper lobe groundglass pulmonary nodule. This likely does reflect a slow-growing highly differentiated adenocarcinoma. He has no risk factors for TB or other etiologies. I believe best plan would be primary resection if he is a good candidate. Need to ensure that his PET scan is negative for metastasis, that he can surgery. We will perform PFT.

## 2017-07-05 NOTE — Assessment & Plan Note (Signed)
Likely undertreated on Advair. He is not sure that he has good control on this medication. He doesn't have an albuterol at home. I like to do a trial of Spiriva to see if he benefits more.

## 2017-07-05 NOTE — Progress Notes (Signed)
Patient seen in the office today and instructed on use of Spiriva Respimat 2.5.  Patient expressed understanding and demonstrated technique.

## 2017-07-06 ENCOUNTER — Telehealth: Payer: Self-pay | Admitting: Emergency Medicine

## 2017-07-06 ENCOUNTER — Other Ambulatory Visit: Payer: Self-pay | Admitting: *Deleted

## 2017-07-06 NOTE — Telephone Encounter (Signed)
lmtcb x1 for pt. 

## 2017-07-06 NOTE — Telephone Encounter (Signed)
Pt returning Lindsay's call

## 2017-07-06 NOTE — Telephone Encounter (Signed)
Spoke with pt. Only his PFT needed to be rescheduled, not his ROV. I have rescheduled pt's PFT to 07/09/2017 at 11am. Pt's ROV has been put back on the schedule for October.

## 2017-07-06 NOTE — Telephone Encounter (Signed)
RB saw the pt yesterday. He wanted the pt to have a PFT before 07/13/17. Pt's PFT was scheduled in October. I have left a message with the pt to reschedule this appointment.

## 2017-07-06 NOTE — Telephone Encounter (Signed)
Patient is returning phone call.  °

## 2017-07-07 ENCOUNTER — Encounter: Payer: Self-pay | Admitting: Surgery

## 2017-07-09 ENCOUNTER — Ambulatory Visit (INDEPENDENT_AMBULATORY_CARE_PROVIDER_SITE_OTHER): Payer: Medicare HMO | Admitting: Emergency Medicine

## 2017-07-09 DIAGNOSIS — R911 Solitary pulmonary nodule: Secondary | ICD-10-CM

## 2017-07-09 LAB — PULMONARY FUNCTION TEST
DL/VA % pred: 90 %
DL/VA: 4.31 ml/min/mmHg/L
DLCO UNC % PRED: 75 %
DLCO UNC: 27.34 ml/min/mmHg
FEF 25-75 PRE: 1.59 L/s
FEF 25-75 Post: 1.82 L/sec
FEF2575-%Change-Post: 14 %
FEF2575-%PRED-POST: 57 %
FEF2575-%Pred-Pre: 50 %
FEV1-%Change-Post: 2 %
FEV1-%PRED-POST: 64 %
FEV1-%Pred-Pre: 63 %
FEV1-POST: 2.55 L
FEV1-Pre: 2.48 L
FEV1FVC-%Change-Post: 0 %
FEV1FVC-%Pred-Pre: 92 %
FEV6-%CHANGE-POST: 3 %
FEV6-%PRED-POST: 74 %
FEV6-%Pred-Pre: 71 %
FEV6-PRE: 3.55 L
FEV6-Post: 3.69 L
FEV6FVC-%CHANGE-POST: 0 %
FEV6FVC-%PRED-PRE: 104 %
FEV6FVC-%Pred-Post: 104 %
FVC-%Change-Post: 3 %
FVC-%Pred-Post: 71 %
FVC-%Pred-Pre: 68 %
FVC-Post: 3.72 L
FVC-Pre: 3.59 L
POST FEV1/FVC RATIO: 69 %
POST FEV6/FVC RATIO: 99 %
Pre FEV1/FVC ratio: 69 %
Pre FEV6/FVC Ratio: 99 %
RV % PRED: 130 %
RV: 3.2 L
TLC % PRED: 93 %
TLC: 7.13 L

## 2017-07-09 NOTE — Progress Notes (Signed)
PFT done today. 

## 2017-07-12 ENCOUNTER — Ambulatory Visit: Payer: Commercial Managed Care - HMO | Admitting: Surgery

## 2017-07-13 ENCOUNTER — Ambulatory Visit (HOSPITAL_COMMUNITY)
Admission: RE | Admit: 2017-07-13 | Discharge: 2017-07-13 | Disposition: A | Discharge status: Home / Self Care | Payer: Medicare HMO | Source: Ambulatory Visit | Attending: Surgery | Admitting: Surgery

## 2017-07-13 DIAGNOSIS — Z79899 Other long term (current) drug therapy: Secondary | ICD-10-CM | POA: Diagnosis not present

## 2017-07-13 DIAGNOSIS — R911 Solitary pulmonary nodule: Secondary | ICD-10-CM | POA: Insufficient documentation

## 2017-07-13 DIAGNOSIS — I700 Atherosclerosis of aorta: Secondary | ICD-10-CM | POA: Insufficient documentation

## 2017-07-13 DIAGNOSIS — J984 Other disorders of lung: Secondary | ICD-10-CM | POA: Diagnosis not present

## 2017-07-13 LAB — GLUCOSE, CAPILLARY: GLUCOSE-CAPILLARY: 91 mg/dL (ref 65–99)

## 2017-07-13 IMAGING — CT NM PET TUM IMG INITIAL (PI) SKULL BASE T - THIGH
1 of 8 series · 1 of 25 positions shown · non-contrast
Comparison: [DATE]

CLINICAL DATA: Initial treatment strategy for enlarging
ground-glass density left upper lobe pulmonary nodule.

EXAM:
NUCLEAR MEDICINE PET SKULL BASE TO THIGH
TECHNIQUE: 11.5 mCi F-18 FDG was injected intravenously. Full-ring PET imaging
was performed from the skull base to thigh after the radiotracer. CT
data was obtained and used for attenuation correction and anatomic
localization.
FASTING BLOOD GLUCOSE:  Value: 91 mg/dl

[Series 4: ct sk_thigh 5.0 hd_fov · axial · 5.0mm · 1.09mm/px · 1 of 231 slices shown]
[im 231/231  brain]
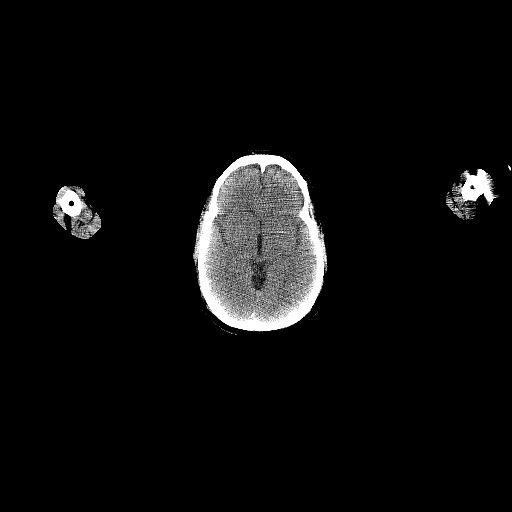

[1 of 25 positions shown; findings below may reference images not displayed]

FINDINGS: NECK

A rim calcified 1.1 cm nodule laterally in the left thyroid lobe on
image 46/4 has maximum standard uptake value of 11.2. No
hypermetabolic adenopathy in the neck observed.

CHEST

Indistinctly marginated 9 by 6 mm left upper lobe ground-glass
density nodule, maximum SUV in this area approximately 0.7. No
hypermetabolic adenopathy in the chest. Some low-grade physiologic
activity in the distal half of the esophagus. Scarring or
atelectasis in the left lower lobe.

Atherosclerotic calcification of the aortic arch and descending
thoracic aorta.

A paraesophageal 0.6 cm lymph node on image 97/4 has maximum SUV of
3.2, probably benign.

ABDOMEN/PELVIS

No abnormal hypermetabolic activity within the liver, pancreas,
adrenal glands, or spleen.

2.0 cm soft tissue density structure in the cecum on image 156/4
associated with some local potential hypermetabolic activity with
maximum SUV up to 15.4. This is not exactly correlate in location
but the bowel may have shifted.

Just below the right hepatic lobe edge, there is abnormal 6.4 by
cm region of inflammatory stranding along omental adipose tissue
with associated hypermetabolic activity and maximum SUV of 2 9.2.
This was not observed on [DATE] and I favor an omental infarct
or inflammatory focus over some sort of liposarcoma or malignancy
given the morphology and appearance.

Accentuated activity in the sigmoid colon is probably physiologic.

1.1 cm hypodense lesion posteriorly in the right hepatic lobe on
image 120/4 without associated abnormal metabolic activity.

Aortoiliac atherosclerotic vascular disease.

SKELETON

No focal hypermetabolic activity to suggest skeletal metastasis.
Deformity of the right iliac crest. Old bilateral rib fractures,
some on the right with healing response.
IMPRESSION: 1. The 9 by 6 mm left upper lobe ground-glass density nodule does
not have accentuated hypermetabolic activity. Please note that many
low-grade malignancies with this morphology are not hypermetabolic
on PET-CT, and given the clear increase in size of this process
since [DATE], low-grade malignancy is a distinct possibility.
2. Hypermetabolic activity associated with stranding around a
lobulation of omental adipose tissue just below the right hepatic
lobe margin. This probably represents inflammatory response to an
omental infarct. It was not present on prior abdomen CT of
[DATE] and hence seems unlikely to be liposarcoma. Correlate
with any tenderness or discomfort in this vicinity.
3. Rim calcified 1.1 cm left thyroid lobe nodule is hypermetabolic.
About 40% of such hypermetabolic thyroid nodules represent
malignancy. Thyroid ultrasound with low threshold for fine-needle
aspiration recommended.
4. 2.0 cm soft tissue density in the cecum may correspond to a focus
of hypermetabolic activity on the PET data with SUV up to 15.4. He
cecal polyp is a distinct possibility. Consider colonoscopy.
5.  Aortic Atherosclerosis ([7S]-[7S]).

## 2017-07-13 MED ORDER — FLUDEOXYGLUCOSE F - 18 (FDG) INJECTION
11.4800 | Freq: Once | INTRAVENOUS | Status: AC | PRN
  Administered 2017-07-13: 11.48 via INTRAVENOUS

## 2017-07-16 ENCOUNTER — Encounter: Payer: Self-pay | Admitting: *Deleted

## 2017-07-16 NOTE — Progress Notes (Signed)
Oncology Nurse Navigator Documentation  Oncology Nurse Navigator Flowsheets 07/16/2017  Navigator Encounter Type Other/Per cancer conference on 07/08/17, patient does not need to see T surgery or oncology at this time. He does not need PET scan.  He needs to be followed by pulmonary with CT scans.  Dr. Servando Snare spoke with Dr. Lake Bells at cancer conference to updated Dr. Lamonte Sakai.  I also updated Dr. Lamonte Sakai and his office today via EMR in-basket message.  I also updated our new patient coordinator that patient does not need to be seen at this time by oncology.   Interventions Other  Acuity Level 2  Time Spent with Patient 15

## 2017-07-19 ENCOUNTER — Ambulatory Visit (INDEPENDENT_AMBULATORY_CARE_PROVIDER_SITE_OTHER): Payer: Medicare HMO | Admitting: Surgery

## 2017-07-19 ENCOUNTER — Telehealth: Payer: Self-pay | Admitting: Emergency Medicine

## 2017-07-19 ENCOUNTER — Encounter: Payer: Self-pay | Admitting: Surgery

## 2017-07-19 VITALS — BP 136/83 | HR 50 | Temp 98.4°F | Resp 20 | Ht 72.0 in | Wt 232.0 lb

## 2017-07-19 DIAGNOSIS — I700 Atherosclerosis of aorta: Secondary | ICD-10-CM | POA: Diagnosis not present

## 2017-07-19 DIAGNOSIS — E041 Nontoxic single thyroid nodule: Secondary | ICD-10-CM

## 2017-07-19 NOTE — Telephone Encounter (Signed)
Will forward this message to RB and I will page him to make him aware that this doctor wants to speak with him about this pt.

## 2017-07-19 NOTE — Progress Notes (Signed)
[facility] [place]  Patient name: Timothy Conway MRN: [id number] DOB: July 14, 1956 Sex: male   REASON FOR VISIT:    Follow-up aortic penetrating ulcer  HISOTRY OF PRESENT ILLNESS:    Timothy Conway is a 61 y.o. male who returns today for follow-up of a transverse arch penetrating ulcer.  This was initially detected in late 2016, when he had a CT scan done for shortness of breath.  He does not endorse any back pain or chest pain.  He suffers from hypercholesterolemia, treated with a statin.  He is a current everyday smoker and has COPD.  He is seeing Dr. Lamonte Sakai for a pulmonary nodule and recently had a PET scan.  PAST MEDICAL HISTORY:   Past Medical History:  Diagnosis Date  . Depression   . Gout   . Hyperlipidemia   . Hypertension      FAMILY HISTORY:   Family History  Problem Relation Age of Onset  . Adopted: Yes    SOCIAL HISTORY:   Social History  Substance Use Topics  . Smoking status: Current Every Day Smoker    Packs/day: 1.00    Years: 40.00    Types: Cigarettes  . Smokeless tobacco: Never Used     Comment: 1 1/2 per day  . Alcohol use No     ALLERGIES:   No Known Allergies   CURRENT MEDICATIONS:   Current Outpatient Prescriptions  Medication Sig Dispense Refill  . famotidine (PEPCID) 20 MG tablet Take 20 mg by mouth 2 (two) times daily.    Marland Kitchen ADVAIR DISKUS 250-50 MCG/DOSE AEPB     . ALPRAZolam (XANAX) 1 MG tablet Take 1 mg by mouth at bedtime as needed for anxiety (per Dr Dorthy Cooler medication list take 1 tablet 5-6 times daily as needed). Per health/medication list sent by Dr Dorthy Cooler    . ARIPiprazole (ABILIFY) 5 MG tablet Take 5 mg by mouth daily.    Marland Kitchen atenolol (TENORMIN) 100 MG tablet Take 100 mg by mouth 2 (two) times daily.    Marland Kitchen gemfibrozil (LOPID) 600 MG tablet Take 600 mg by mouth 2 (two) times daily before a meal.    . Tiotropium Bromide Monohydrate (SPIRIVA RESPIMAT)  2.5 MCG/ACT AERS Inhale 2 puffs into the lungs daily. 1 Inhaler 0   No current facility-administered medications for this visit.     REVIEW OF SYSTEMS:   [X]  denotes positive finding, [ ]  denotes negative finding Cardiac  Comments:  Chest pain or chest pressure:    Shortness of breath upon exertion: x   Short of breath when lying flat: x   Irregular heart rhythm:        Vascular    Pain in calf, thigh, or hip brought on by ambulation:    Pain in feet at night that wakes you up from your sleep:     Blood clot in your veins:    Leg swelling:         Pulmonary    Oxygen at home:    Productive cough:  x   Wheezing:  x       Neurologic    Sudden weakness in arms or legs:     Sudden numbness in arms or legs:     Sudden  onset of difficulty speaking or slurred speech:    Temporary loss of vision in one eye:     Problems with dizziness:         Gastrointestinal    Blood in stool:     Vomited blood:         Genitourinary    Burning when urinating:     Blood in urine:        Psychiatric    Major depression:         Hematologic    Bleeding problems:    Problems with blood clotting too easily:        Skin    Rashes or ulcers:        Constitutional    Fever or chills:      PHYSICAL EXAM:   Vitals:   07/19/17 1325  BP: 136/83  Pulse: (!) 50  Resp: 20  Temp: 98.4 F (36.9 C)  TempSrc: Oral  SpO2: 97%  Weight: 232 lb (105.2 kg)  Height: 6' (1.829 m)    GENERAL: The patient is a well-nourished male, in no acute distress. The vital signs are documented above. CARDIAC: There is a regular rate and rhythm.  VASCULAR: No carotid bruits. PULMONARY: Non-labored respirations ABDOMEN: Soft and non-tender with normal pitched bowel sounds.  MUSCULOSKELETAL: There are no major deformities or cyanosis. NEUROLOGIC: No focal weakness or paresthesias are detected. SKIN: There are no ulcers or rashes noted. PSYCHIATRIC: The patient has a normal affect.  STUDIES:   I  reviewed the CT scan with the following findings VASCULAR  1. Stable small penetrating atherosclerotic ulcer arising from the aortic infundibulum. 2. Stable ectasia of the tubular portion of the ascending thoracic aorta with a maximal diameter of 3.6 cm. 3.  Aortic Atherosclerosis (ICD10-170.0) .  NON VASCULAR  1. Enlarging ground-glass attenuation airspace opacity in the left upper lobe presently measuring 0.9 x 0.6 cm. Interval enlargement compared to 06/08/2016 and 11/27/2015 is concerning for low grade adenocarcinoma, or adenocarcinoma in situ. The nodule does not demonstrate a significant solid or semi-solid component and may be below the size threshold for adequate evaluation by PET-CT. Consider referral to the multi disciplinary tumor oncology clinic [facility]). These results will be called to the ordering clinician or representative by the Radiologist Assistant, and communication documented in the PACS or zVision Dashboard.  PET: 1. The 9 by 6 mm left upper lobe ground-glass density nodule does not have accentuated hypermetabolic activity. Please note that many low-grade malignancies with this morphology are not hypermetabolic on PET-CT, and given the clear increase in size of this process since 11/27/2015, low-grade malignancy is a distinct possibility. 2. Hypermetabolic activity associated with stranding around a lobulation of omental adipose tissue just below the right hepatic lobe margin. This probably represents inflammatory response to an omental infarct. It was not present on prior abdomen CT of 03/20/2016 and hence seems unlikely to be liposarcoma. Correlate with any tenderness or discomfort in this vicinity. 3. Rim calcified 1.1 cm left thyroid lobe nodule is hypermetabolic. About 40% of such hypermetabolic thyroid nodules represent malignancy. Thyroid ultrasound with low threshold for fine-needle aspiration recommended. 4. 2.0 cm soft tissue density in the cecum  may correspond to a focus of hypermetabolic activity on the PET data with SUV up to 15.4. He cecal polyp is a distinct possibility. Consider colonoscopy. 5.  Aortic Atherosclerosis (ICD10-I70.0).   MEDICAL ISSUES:   Aortic penetrating ulcer: There has not been any significant change in the size of the  ulcer.  Therefore, I am going to schedule him for a CT scan in 2 years.  I am trying to discuss with Dr. Lamonte Sakai, the results of the PET.  I left a message for him at his office today.  The most recent oncology navigator states that this needs to be followed with serial CT scans.  The findings on PET scan need to be addressed.    Annamarie Major, MD [facility] [phone] Pager [phone]

## 2017-07-20 NOTE — Telephone Encounter (Signed)
Please call the patient and let him know that I reviewed his PET scan with Dr Trula Slade. The pulmonary nodules that we are following did not show any hypermetabolism, which would increase suspicion for lung cancer. All the same, as we discussed, this result does not mean that the nodules are not a slow growing cancer - therefore wewill need to follow up to plan our next steps.   The PET scan did show some areas that were hypermetabolic in the abdomen and in the thyroid. The significance of these findings is unclear - often similar findings are NORMAL or BENIGN. To be complete, we should arrange for him to have evaluations of both areas to insure that there is no evidence of an abnormality.   If he agrees, I would like for him to see GI regarding the abnormal abdominal PET findings and probably for a colonoscopy. I would also like to refer him to be seen by interventional radiology for a possible biopsy of thyroid nodule.   Thanks.

## 2017-07-21 ENCOUNTER — Telehealth: Payer: Self-pay | Admitting: Emergency Medicine

## 2017-07-21 DIAGNOSIS — Z1211 Encounter for screening for malignant neoplasm of colon: Secondary | ICD-10-CM | POA: Diagnosis not present

## 2017-07-21 DIAGNOSIS — R935 Abnormal findings on diagnostic imaging of other abdominal regions, including retroperitoneum: Secondary | ICD-10-CM | POA: Diagnosis not present

## 2017-07-21 NOTE — Telephone Encounter (Signed)
Let him know that it showed moderately severe obstruction and some air-retention consistent with COPD.

## 2017-07-21 NOTE — Telephone Encounter (Signed)
Spoke with pt, aware of results/recs.  Pt is already set up to see GI- seeing his established GI doc today at 2:00 for eval and scheduling of colonoscopy.   Pt agrees to IR referral- this order has been placed.    Pt is requesting PFT results.  RB please advise on results. Thanks!

## 2017-07-21 NOTE — Telephone Encounter (Signed)
Pt has been made aware of RB's recommendations and voiced his understanding. Nothing further needed.

## 2017-07-21 NOTE — Telephone Encounter (Signed)
About 65% of predicted for his age and size. We consider anything 80-100% predicted to be normal range

## 2017-07-21 NOTE — Telephone Encounter (Signed)
lmtcb X1 for pt  

## 2017-07-21 NOTE — Telephone Encounter (Signed)
Called spoke with patient and discussed PFT results per RB below.  Pt very grateful for the prompt reply.  He does also question though the percentage at which his lungs are functioning - specific percent of lung decline or improvement.  Dr Lamonte Sakai please advise, thank you.

## 2017-07-21 NOTE — Telephone Encounter (Signed)
This is a duplicate phone encounter.  See 8/20 phone note.  Will close message.

## 2017-07-22 ENCOUNTER — Other Ambulatory Visit: Payer: Self-pay | Admitting: Emergency Medicine

## 2017-07-22 DIAGNOSIS — E041 Nontoxic single thyroid nodule: Secondary | ICD-10-CM

## 2017-07-30 ENCOUNTER — Ambulatory Visit (HOSPITAL_COMMUNITY)
Admission: RE | Admit: 2017-07-30 | Discharge: 2017-07-30 | Disposition: A | Discharge status: Home / Self Care | Payer: Medicare HMO | Source: Ambulatory Visit | Attending: Emergency Medicine | Admitting: Emergency Medicine

## 2017-07-30 ENCOUNTER — Other Ambulatory Visit: Payer: Self-pay | Admitting: Emergency Medicine

## 2017-07-30 DIAGNOSIS — E041 Nontoxic single thyroid nodule: Secondary | ICD-10-CM

## 2017-07-30 DIAGNOSIS — C73 Malignant neoplasm of thyroid gland: Secondary | ICD-10-CM | POA: Diagnosis not present

## 2017-07-30 IMAGING — US US THYROID BIOPSY
1 series · 13 of 13 positions shown · non-contrast
Comparison: Ultrasound done today [DATE]

MEDICATIONS:
1% lidocaine

COMPLICATIONS:
None immediate.

INDICATION: Indeterminate thyroid nodule, hypermetabolic on recent PET scan.

EXAM:
ULTRASOUND GUIDED FINE NEEDLE ASPIRATION OF INDETERMINATE THYROID
NODULE
TECHNIQUE: Informed written consent was obtained from the patient after a
discussion of the risks, benefits and alternatives to treatment.
Questions regarding the procedure were encouraged and answered. A
timeout was performed prior to the initiation of the procedure.

[Series 1: us thyroid biopsy · 13 acquisitions, 13 frames shown]
[im 1/13]
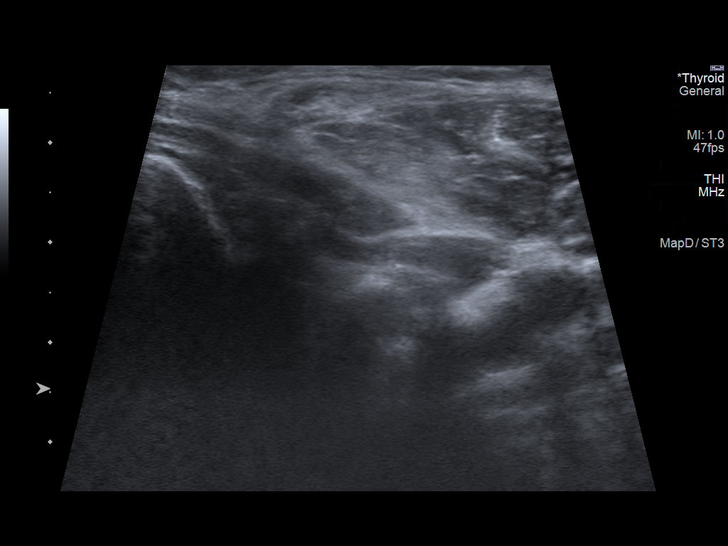
[im 2/13]
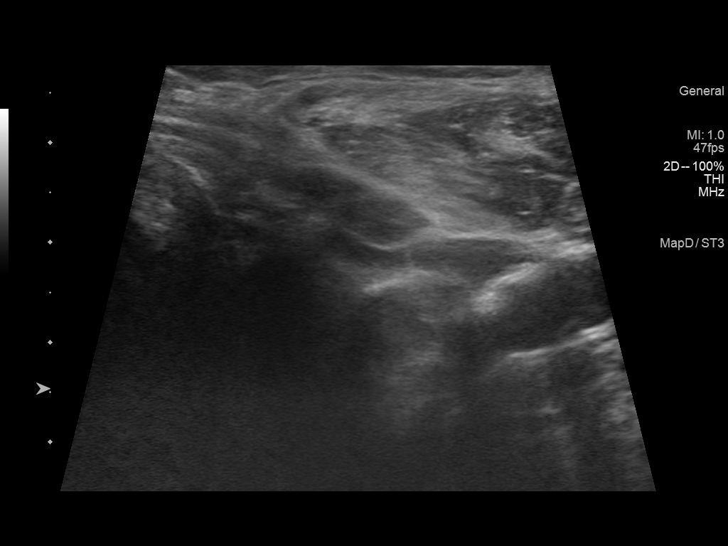
[im 3/13]
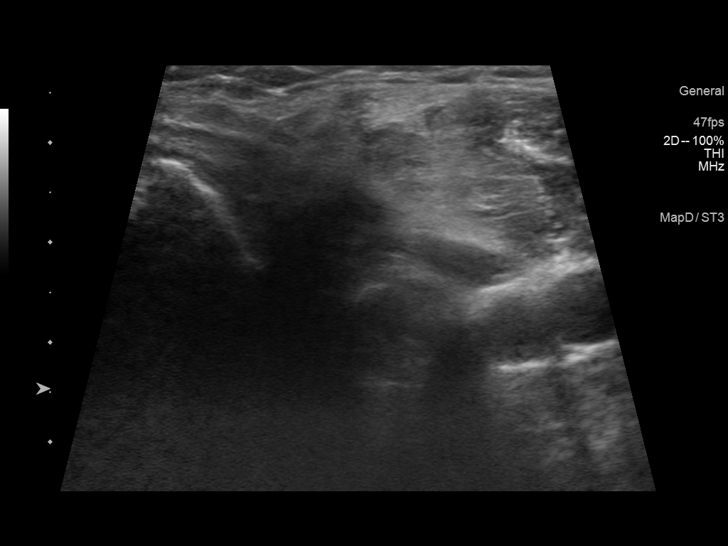
[im 4/13]
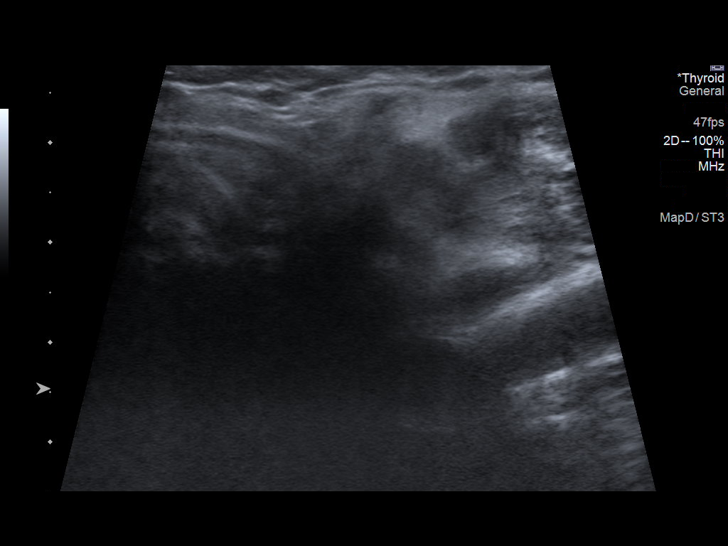
[im 5/13]
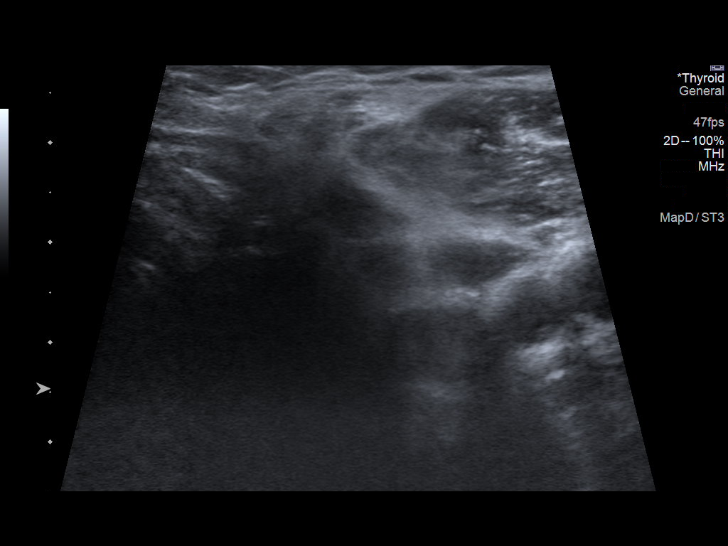
[im 6/13]
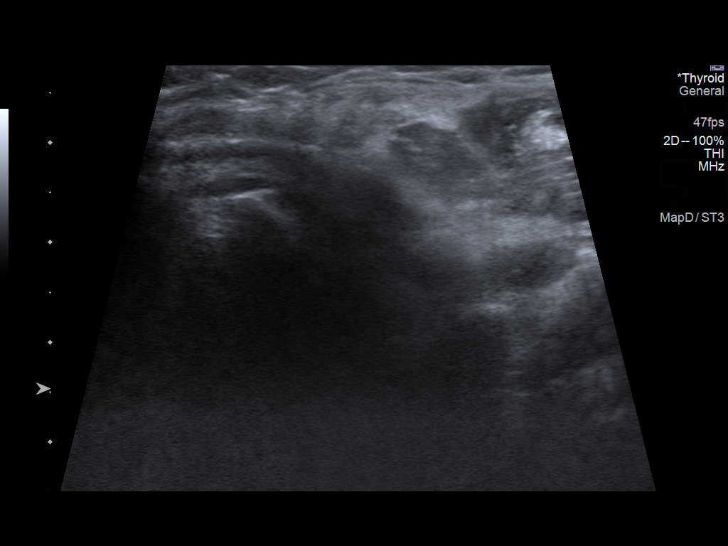
[im 7/13]
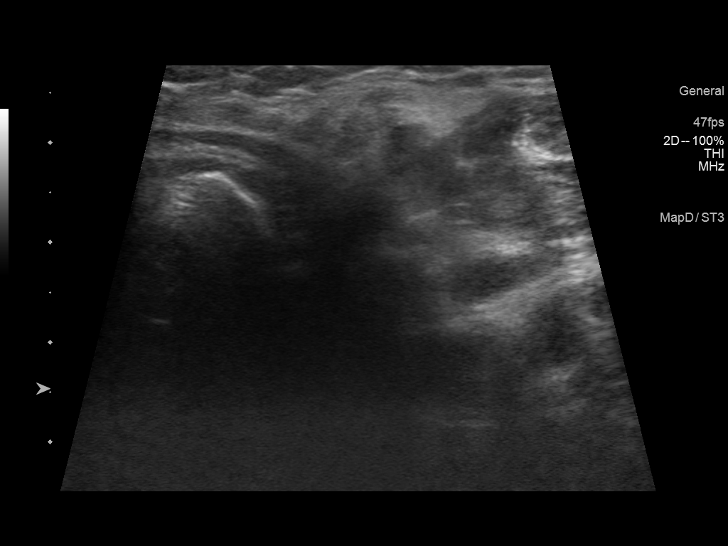
[im 8/13]
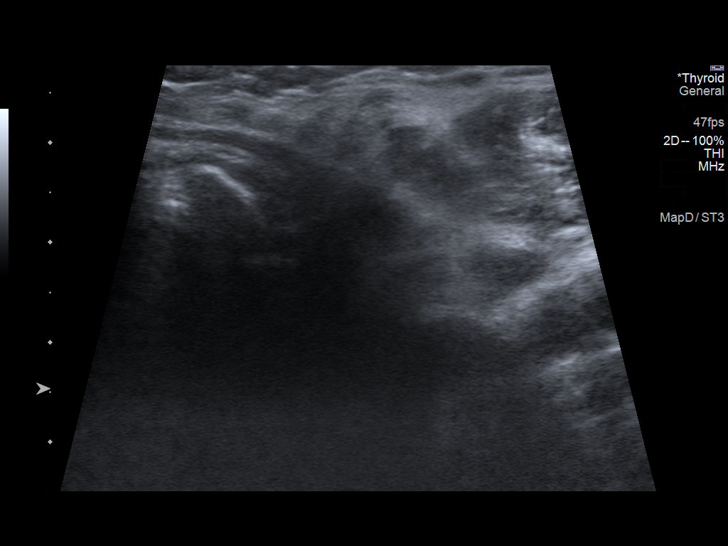
[im 9/13]
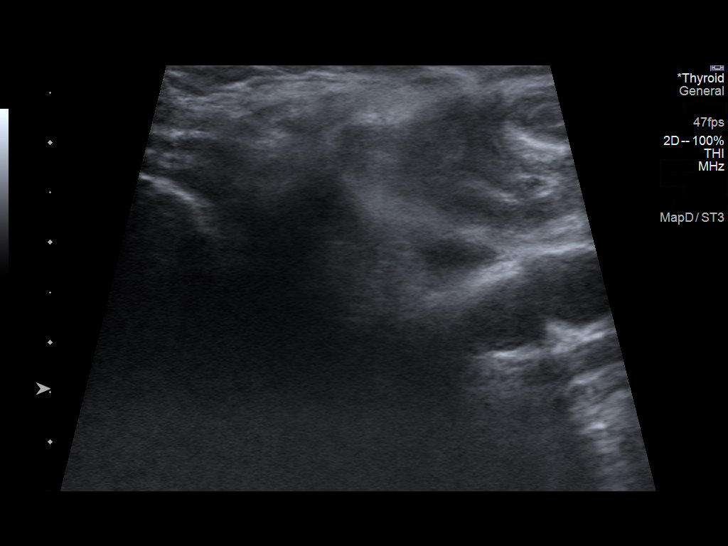
[im 10/13]
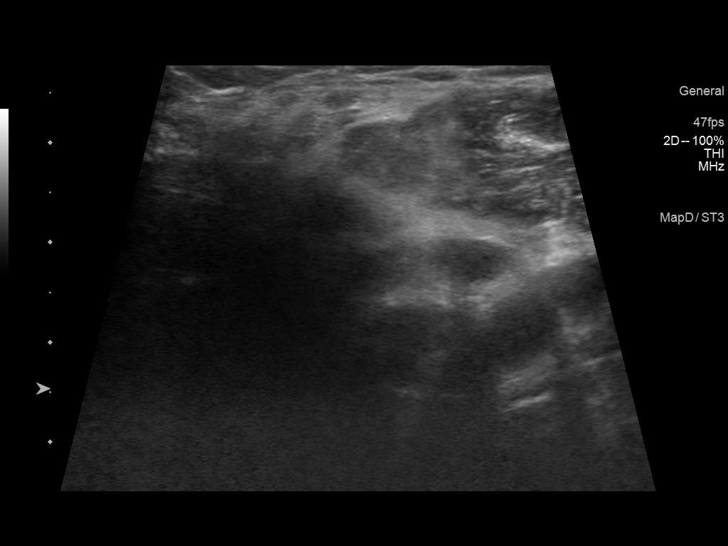
[im 11/13]
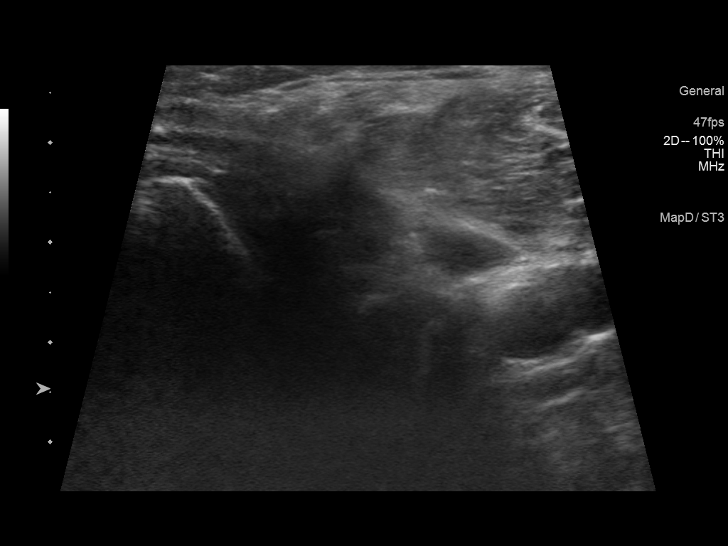
[im 12/13]
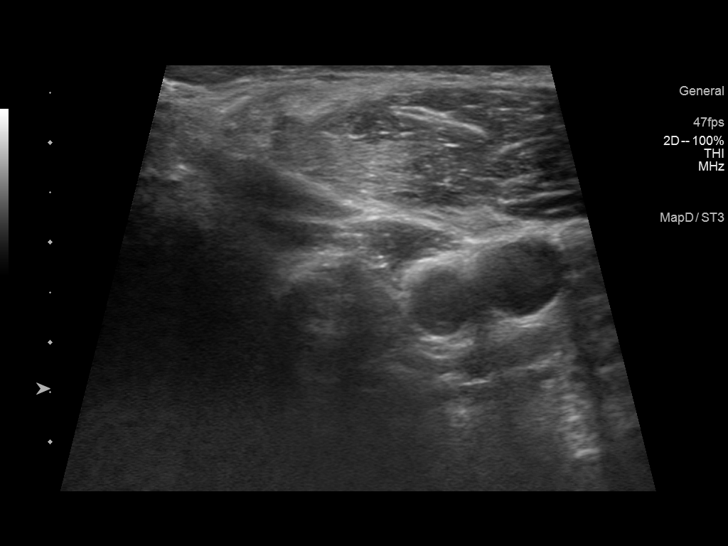
[im 13/13]
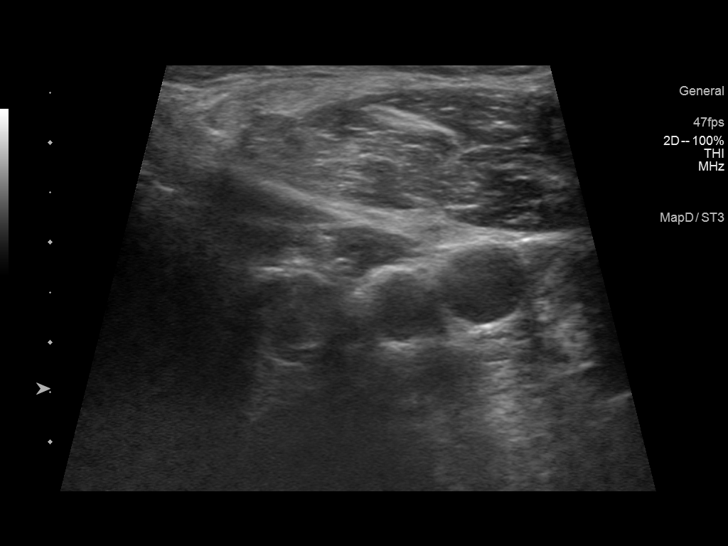

[13 of 13 positions shown; findings below may reference images not displayed]

Pre-procedural ultrasound scanning demonstrated unchanged size and
appearance of the indeterminate nodule within the left mid thyroid.

The procedure was planned. The neck was prepped in the usual sterile
fashion, and a sterile drape was applied covering the operative
field. A timeout was performed prior to the initiation of the
procedure. Local anesthesia was provided with 1% lidocaine.

Under direct ultrasound guidance, 4 FNA biopsies were performed of
the left mid thyroid nodule with a 22 gauge needle. Multiple
ultrasound images were saved for procedural documentation purposes.
The samples were prepared and submitted to pathology.

Limited post procedural scanning was negative for hematoma or
additional complication. Dressings were placed. The patient
tolerated the above procedures procedure well without immediate
postprocedural complication.
FINDINGS: Nodule reference number based on prior diagnostic ultrasound: 1

Maximum size: 1.3 cm

Location: Left; Mid

ACR TI-RADS risk category: TR5 (>/= 7 points)

Reason for biopsy: meets ACR TI-RADS criteria

Ultrasound imaging confirms appropriate placement of the needles
within the thyroid nodule.
IMPRESSION: Technically successful ultrasound guided fine needle aspiration of
nodule 1 within the left mid thyroid.

## 2017-07-30 MED ORDER — LIDOCAINE HCL 1 % IJ SOLN
INTRAMUSCULAR | Status: AC
  Filled 2017-07-30: qty 10

## 2017-08-03 ENCOUNTER — Telehealth: Payer: Self-pay | Admitting: Emergency Medicine

## 2017-08-03 DIAGNOSIS — C801 Malignant (primary) neoplasm, unspecified: Secondary | ICD-10-CM

## 2017-08-03 NOTE — Telephone Encounter (Signed)
RB, please advise on patient's biopsy results from 8/31. Thanks!

## 2017-08-04 NOTE — Telephone Encounter (Signed)
Referral has been placed per RB.

## 2017-08-04 NOTE — Telephone Encounter (Signed)
I spoke with the patient, reviewed the results - shows papillary carcinoma. I believe he needs referral to general sgy to discuss partial thyroidectomy. He understands and agrees. Please refer him to [place] surgery. Thanks.

## 2017-08-09 ENCOUNTER — Telehealth: Payer: Self-pay | Admitting: Emergency Medicine

## 2017-08-09 NOTE — Telephone Encounter (Signed)
Spoke with the pt  He states needing copy of his bx results mailed to him  I have mailed it to him after verifying his home address  He states that he was already contacted about his appt with surgeon  Nothing further needed

## 2017-08-16 ENCOUNTER — Ambulatory Visit: Payer: Self-pay | Admitting: Surgery

## 2017-08-16 DIAGNOSIS — C73 Malignant neoplasm of thyroid gland: Secondary | ICD-10-CM | POA: Diagnosis not present

## 2017-08-24 ENCOUNTER — Encounter (HOSPITAL_COMMUNITY): Payer: Self-pay | Admitting: Surgery

## 2017-08-24 DIAGNOSIS — C73 Malignant neoplasm of thyroid gland: Secondary | ICD-10-CM | POA: Diagnosis present

## 2017-08-24 HISTORY — DX: Malignant neoplasm of thyroid gland: C73

## 2017-08-24 NOTE — Patient Instructions (Signed)
Timothy Conway  08/24/2017   Your procedure is scheduled on: 08/27/17  Report to [facility] Main  Entrance Take [place]  elevators to 3rd floor to  [place] at     0730 AM.    Call this number if you have problems the morning of surgery [phone]    Remember: ONLY 1 PERSON MAY GO WITH YOU TO SHORT STAY TO GET  READY MORNING OF [place].  Do not eat food or drink liquids :After Midnight.     Take these medicines the morning of surgery with A SIP OF WATER:  GEMFIBROZIL, PEPCID, ATENELOL, XANAX IF NEEDED, INHALER AND BRING                                You may not have any metal on your body including hair pins and              piercings  Do not wear jewelry,lotions, powders or perfumes, deodorant .              Men may shave face and neck.   Do not bring valuables to the hospital. [place].  Contacts, dentures or bridgework may not be worn into surgery.  Leave suitcase in the car. After surgery it may be brought to your room.                Please read over the following fact sheets you were given: _____________________________________________________________________          [facility] - Preparing for Surgery Before surgery, you can play an important role.  Because skin is not sterile, your skin needs to be as free of germs as possible.  You can reduce the number of germs on your skin by washing with CHG (chlorahexidine gluconate) soap before surgery.  CHG is an antiseptic cleaner which kills germs and bonds with the skin to continue killing germs even after washing. Please DO NOT use if you have an allergy to CHG or antibacterial soaps.  If your skin becomes reddened/irritated stop using the CHG and inform your nurse when you arrive at Short Stay. Do not shave (including legs and underarms) for at least 48 hours prior to the first CHG shower.  You may shave your face/neck. Please follow  these instructions carefully:  1.  Shower with CHG Soap the night before surgery and the  morning of Surgery.  2.  If you choose to wash your hair, wash your hair first as usual with your  normal  shampoo.  3.  After you shampoo, rinse your hair and body thoroughly to remove the  shampoo.                           4.  Use CHG as you would any other liquid soap.  You can apply chg directly  to the skin and wash                       Gently with a scrungie or clean washcloth.  5.  Apply the CHG Soap to your body ONLY FROM THE NECK DOWN.   Do not use on face/ open  Wound or open sores. Avoid contact with eyes, ears mouth and genitals (private parts).                       Wash face,  Genitals (private parts) with your normal soap.             6.  Wash thoroughly, paying special attention to the area where your surgery  will be performed.  7.  Thoroughly rinse your body with warm water from the neck down.  8.  DO NOT shower/wash with your normal soap after using and rinsing off  the CHG Soap.                9.  Pat yourself dry with a clean towel.            10.  Wear clean pajamas.            11.  Place clean sheets on your bed the night of your first shower and do not  sleep with pets. Day of Surgery : Do not apply any lotions/deodorants the morning of surgery.  Please wear clean clothes to the hospital/surgery center.  FAILURE TO FOLLOW THESE INSTRUCTIONS MAY RESULT IN THE CANCELLATION OF YOUR SURGERY PATIENT SIGNATURE_________________________________  NURSE SIGNATURE__________________________________  ________________________________________________________________________

## 2017-08-24 NOTE — H&P (Signed)
General Surgery [facility]  Timothy Conway DOB: [birth year] Single / Language: Timothy Conway / Race: White Male   History of Present Illness  The patient is a 61 year old male who presents with thyroid cancer.  CC: papillary thyroid carcinoma  Patient is referred by Dr. Baltazar Apo for evaluation and management of newly diagnosed papillary thyroid carcinoma. Patient is followed for COPD. He has regular CT scans of the chest. A pulmonary nodule was noted to have enlarged. Patient underwent a PET scan which showed that the pulmonary nodule was likely benign but showed an active lesion in the left lobe of the thyroid. Patient underwent thyroid ultrasound on July 30, 2017. This showed a 1.3 cm suspicious nodule in the left thyroid lobe. There was a 5 mm nodule in the right thyroid lobe. Thyroid gland was normal in size. Fine-needle aspiration biopsy of the left lobe nodule was performed and found to have papillary thyroid carcinoma, Bethesda category VI. Patient is now referred for evaluation for thyroidectomy for treatment of papillary thyroid carcinoma. Patient has no prior history of thyroid disease. He is never been on thyroid medication. There is no family history of endocrine neoplasm. Patient has had no prior head or neck surgery.   Past Surgical History Cataract Surgery  Right. Sentinel Lymph Node Biopsy   Diagnostic Studies History Colonoscopy  never  Allergies  No Known Drug Allergies 08/16/2017 Allergies Reconciled   Medication History Gemfibrozil (600MG  Tablet, Oral) Active. Atenolol (100MG  Tablet, Oral) Active. PriLOSEC (10MG  Capsule DR, Oral) Active. PriLOSEC OTC (20MG  Tablet DR, Oral) Active. Medications Reconciled  Social History Alcohol use  Moderate alcohol use. Caffeine use  Coffee. Illicit drug use  Remotely quit drug use. Tobacco use  Current every day smoker.  Family History  Family history unknown  First  Degree Relatives   Other Problems Anxiety Disorder  Chronic Obstructive Lung Disease  Depression  Gastric Ulcer  High blood pressure  Hypercholesterolemia  Sleep Apnea   Review of Systems General Present- Fatigue. Not Present- Appetite Loss, Chills, Fever, Night Sweats, Weight Gain and Weight Loss. Skin Not Present- Change in Wart/Mole, Dryness, Hives, Jaundice, New Lesions, Non-Healing Wounds, Rash and Ulcer. HEENT Present- Hearing Loss, Ringing in the Ears, Seasonal Allergies and Wears glasses/contact lenses. Not Present- Earache, Hoarseness, Nose Bleed, Oral Ulcers, Sinus Pain, Sore Throat, Visual Disturbances and Yellow Eyes. Respiratory Present- Chronic Cough. Not Present- Bloody sputum, Difficulty Breathing, Snoring and Wheezing. Breast Not Present- Breast Mass, Breast Pain, Nipple Discharge and Skin Changes. Cardiovascular Present- Difficulty Breathing Lying Down. Not Present- Chest Pain, Leg Cramps, Palpitations, Rapid Heart Rate, Shortness of Breath and Swelling of Extremities. Gastrointestinal Not Present- Abdominal Pain, Bloating, Bloody Stool, Change in Bowel Habits, Chronic diarrhea, Constipation, Difficulty Swallowing, Excessive gas, Gets full quickly at meals, Hemorrhoids, Indigestion, Nausea, Rectal Pain and Vomiting. Male Genitourinary Not Present- Blood in Urine, Change in Urinary Stream, Frequency, Impotence, Nocturia, Painful Urination, Urgency and Urine Leakage. Musculoskeletal Not Present- Back Pain, Joint Pain, Joint Stiffness, Muscle Pain, Muscle Weakness and Swelling of Extremities. Neurological Not Present- Decreased Memory, Fainting, Headaches, Numbness, Seizures, Tingling, Tremor, Trouble walking and Weakness. Psychiatric Present- Anxiety and Depression. Not Present- Bipolar, Change in Sleep Pattern, Fearful and Frequent crying. Endocrine Not Present- Cold Intolerance, Excessive Hunger, Hair Changes, Heat Intolerance, Hot flashes and New  Diabetes. Hematology Not Present- Blood Thinners, Easy Bruising, Excessive bleeding, Gland problems, HIV and Persistent Infections.  Vitals Weight: 230 lb Height: 72in Body Surface Area: 2.26 m Body Mass  Index: 31.19 kg/m  Temp.: 99.23F  Pulse: 76 (Regular)  Resp.: 18 (Unlabored)  P.OX: 97% (Room air) BP: 128/86 (Sitting, Left Arm, Standard)  Physical Exam   See vital signs recorded above  GENERAL APPEARANCE Development: normal Nutritional status: normal Gross deformities: none  SKIN Rash, lesions, ulcers: none Induration, erythema: none Nodules: none palpable  EYES Conjunctiva and lids: normal Pupils: equal and reactive Iris: normal bilaterally  EARS, NOSE, MOUTH, THROAT External ears: no lesion or deformity External nose: no lesion or deformity Hearing: grossly normal Lips: no lesion or deformity Dentition: normal for age Oral mucosa: moist  NECK Symmetric: yes Trachea: midline Thyroid: Right thyroid lobe is without palpable abnormality. Left thyroid lobe shows a dominant smooth rounded relatively firm nodule in the mid pole which is mobile with swallowing and nontender. There is no associated lymphadenopathy palpable.  CHEST Respiratory effort: normal Retraction or accessory muscle use: no Breath sounds: normal bilaterally Rales, rhonchi, wheeze: none  CARDIOVASCULAR Auscultation: regular rhythm, normal rate Murmurs: none Pulses: carotid and radial pulse 2+ palpable Lower extremity edema: none Lower extremity varicosities: none  MUSCULOSKELETAL Station and gait: normal Digits and nails: no clubbing or cyanosis Muscle strength: grossly normal all extremities Range of motion: grossly normal all extremities Deformity: none  LYMPHATIC Cervical: none palpable Supraclavicular: none palpable  PSYCHIATRIC Oriented to person, place, and time: yes Mood and affect: normal for situation Judgment and insight: appropriate for  situation    Assessment & Plan  PAPILLARY THYROID CARCINOMA (C73)  Pt Education - Pamphlet Given - The Thyroid Book: discussed with patient and provided information. The patient presents on referral from his pulmonologist for evaluation and management of newly diagnosed papillary thyroid carcinoma. Patient is given written literature on thyroid surgery to review at home.  Patient has a biopsy-proven papillary thyroid carcinoma measuring 1.3 cm involving the left thyroid lobe. He has a small nodule present in the right thyroid lobe which has not been biopsied. There is no clinical evidence of metastatic disease.  I have recommended proceeding with total thyroidectomy with limited central compartment lymph node dissection. We discussed the risk and benefits of this procedure. We discussed thyroid lobectomy. We discussed potential for complication including recurrent laryngeal nerve injury and injury to parathyroid glands. We discussed the need for lifelong thyroid hormone replacement. We discussed the potential need for radioactive iodine treatment. We will arrange for consultation with endocrinology following his surgery. Patient understands and agrees to proceed with thyroid surgery in the near future.  The risks and benefits of the procedure have been discussed at length with the patient. The patient understands the proposed procedure, potential alternative treatments, and the course of recovery to be expected. All of the patient's questions have been answered at this time. The patient wishes to proceed with surgery.   Earnstine Regal, MD, [facility] Office: [phone]

## 2017-08-25 ENCOUNTER — Ambulatory Visit (HOSPITAL_COMMUNITY)
Admission: RE | Admit: 2017-08-25 | Discharge: 2017-08-25 | Disposition: A | Discharge status: Home / Self Care | Payer: Medicare HMO | Source: Ambulatory Visit | Attending: Anesthesiology | Admitting: Anesthesiology

## 2017-08-25 ENCOUNTER — Encounter (HOSPITAL_COMMUNITY): Payer: Self-pay

## 2017-08-25 ENCOUNTER — Encounter (HOSPITAL_COMMUNITY)
Admission: RE | Admit: 2017-08-25 | Discharge: 2017-08-25 | Disposition: A | Discharge status: Home / Self Care | Payer: Medicare HMO | Source: Ambulatory Visit | Attending: Surgery | Admitting: Surgery

## 2017-08-25 DIAGNOSIS — C73 Malignant neoplasm of thyroid gland: Secondary | ICD-10-CM | POA: Insufficient documentation

## 2017-08-25 DIAGNOSIS — R001 Bradycardia, unspecified: Secondary | ICD-10-CM | POA: Diagnosis not present

## 2017-08-25 DIAGNOSIS — I4510 Unspecified right bundle-branch block: Secondary | ICD-10-CM | POA: Insufficient documentation

## 2017-08-25 DIAGNOSIS — I444 Left anterior fascicular block: Secondary | ICD-10-CM | POA: Diagnosis not present

## 2017-08-25 DIAGNOSIS — Z01810 Encounter for preprocedural cardiovascular examination: Secondary | ICD-10-CM | POA: Diagnosis not present

## 2017-08-25 DIAGNOSIS — Z01818 Encounter for other preprocedural examination: Secondary | ICD-10-CM

## 2017-08-25 DIAGNOSIS — J449 Chronic obstructive pulmonary disease, unspecified: Secondary | ICD-10-CM | POA: Diagnosis not present

## 2017-08-25 DIAGNOSIS — Z01812 Encounter for preprocedural laboratory examination: Secondary | ICD-10-CM | POA: Diagnosis not present

## 2017-08-25 HISTORY — DX: Malignant neoplasm of thyroid gland: C73

## 2017-08-25 HISTORY — DX: Cardiac arrhythmia, unspecified: I49.9

## 2017-08-25 HISTORY — DX: Anxiety disorder, unspecified: F41.9

## 2017-08-25 HISTORY — DX: Chronic obstructive pulmonary disease, unspecified: J44.9

## 2017-08-25 LAB — CBC
HEMATOCRIT: 50.4 % (ref 39.0–52.0)
HEMOGLOBIN: 16.8 g/dL (ref 13.0–17.0)
MCH: 31.2 pg (ref 26.0–34.0)
MCHC: 33.3 g/dL (ref 30.0–36.0)
MCV: 93.5 fL (ref 78.0–100.0)
Platelets: 275 10*3/uL (ref 150–400)
RBC: 5.39 MIL/uL (ref 4.22–5.81)
RDW: 13.2 % (ref 11.5–15.5)
WBC: 7.9 10*3/uL (ref 4.0–10.5)

## 2017-08-25 LAB — BASIC METABOLIC PANEL
ANION GAP: 10 (ref 5–15)
BUN: 12 mg/dL (ref 6–20)
CHLORIDE: 104 mmol/L (ref 101–111)
CO2: 24 mmol/L (ref 22–32)
Calcium: 9.4 mg/dL (ref 8.9–10.3)
Creatinine, Ser: 1.13 mg/dL (ref 0.61–1.24)
GFR calc Af Amer: >60 mL/min (ref >60)
GFR calc non Af Amer: >60 mL/min (ref >60)
GLUCOSE: 97 mg/dL (ref 65–99)
POTASSIUM: 5.1 mmol/L (ref 3.5–5.1)
SODIUM: 138 mmol/L (ref 135–145)

## 2017-08-25 IMAGING — DX DG CHEST 2V
2 series · 2 of 2 positions shown · non-contrast
Comparison: [DATE]

CLINICAL DATA: Preop for thyroidectomy. History of COPD and
hypertension.

EXAM:
CHEST  2 VIEW

[chest pa]
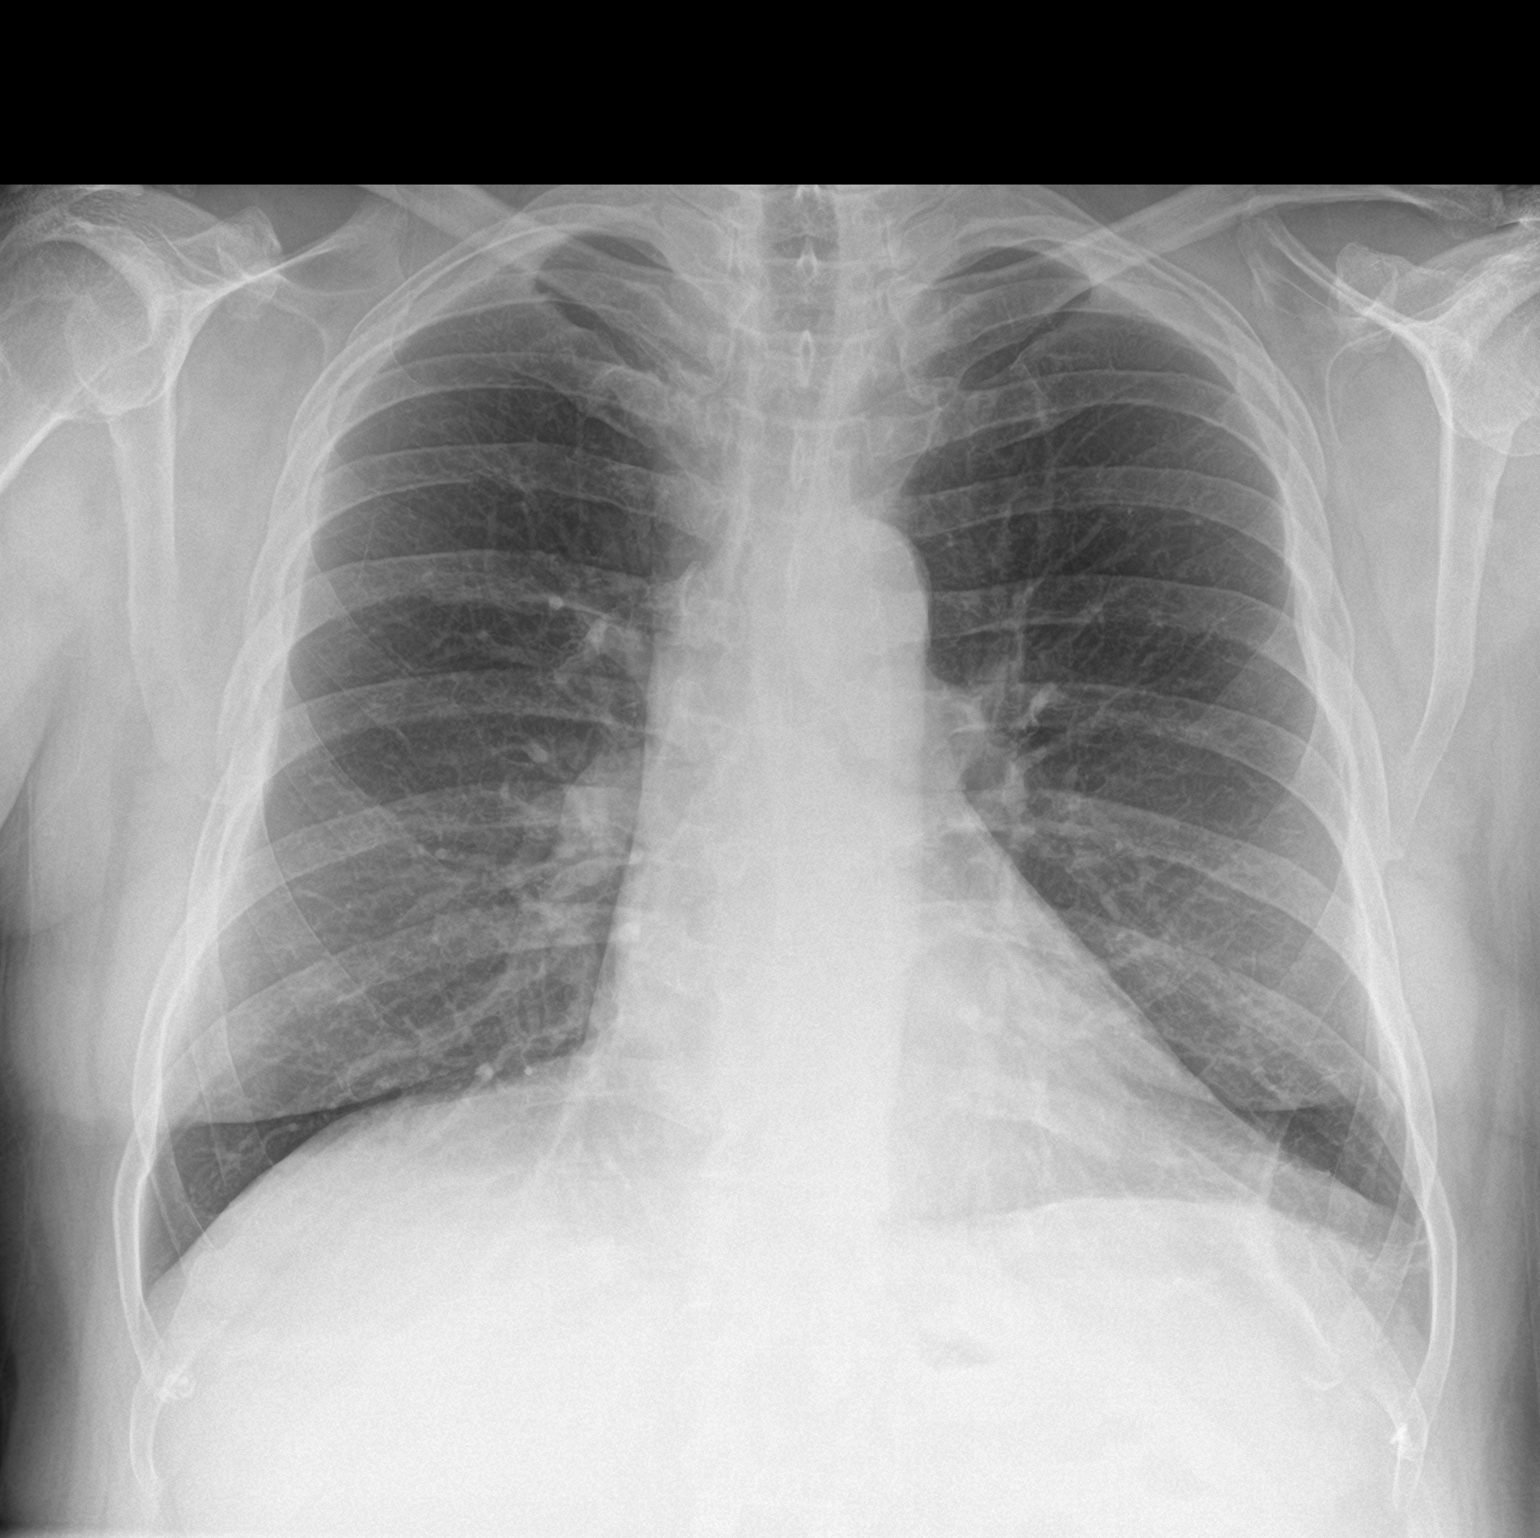

[chest lat]
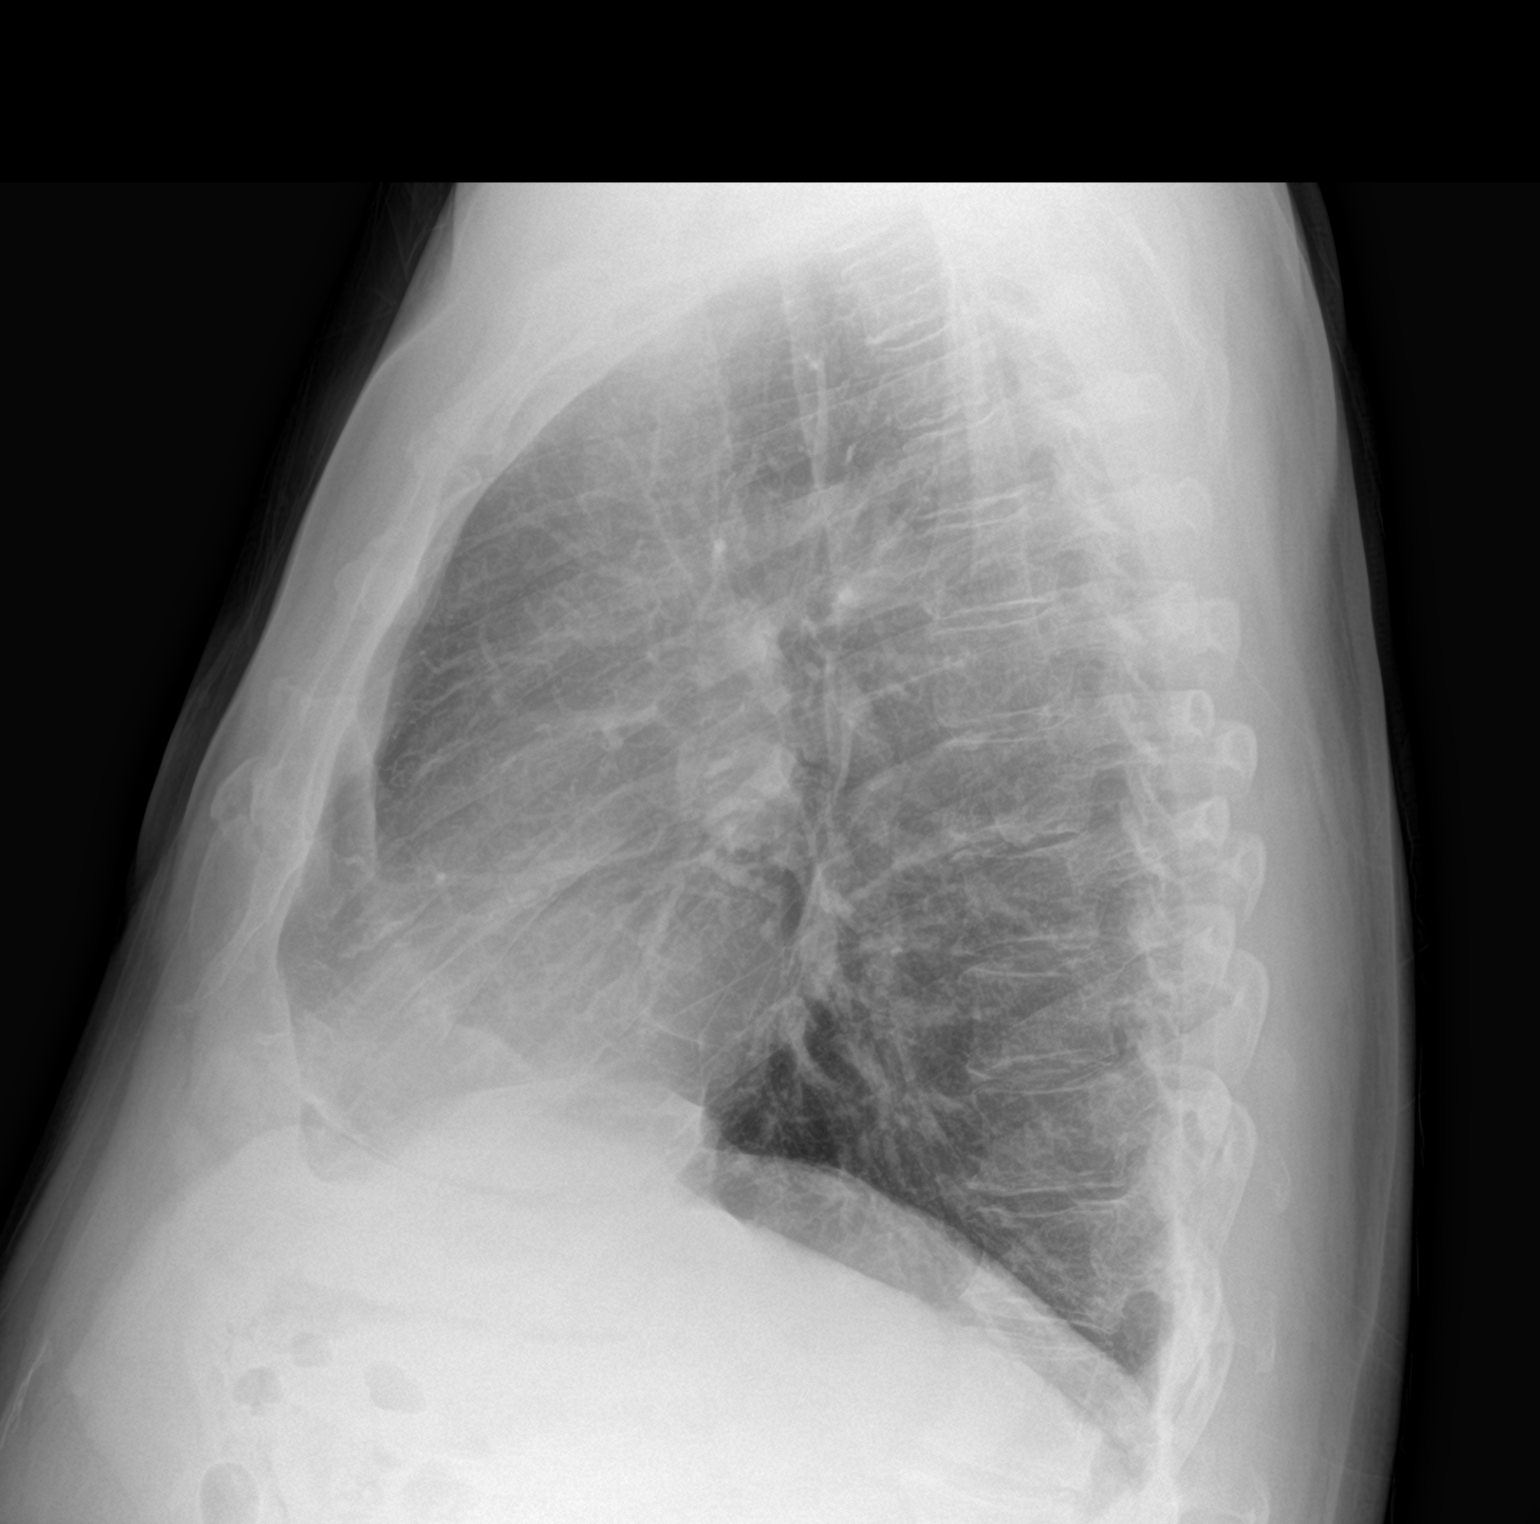

[2 of 2 positions shown; findings below may reference images not displayed]

FINDINGS: Cardiac silhouette is normal in size and configuration. Normal
mediastinal and hilar contours.

Lungs are mildly hyperexpanded but clear.

No pleural effusion or pneumothorax.

Skeletal structures are intact.
IMPRESSION: No acute cardiopulmonary disease.

## 2017-08-25 NOTE — Progress Notes (Signed)
Final EKG 08/25/17 in epic. Showed to Dr. Conrad [place] when he came to see pt. For preop  Consultation.

## 2017-08-25 NOTE — Consult Note (Signed)
Asked by Dr Harlow Asa to see Timothy Conway preoperatively. Pt is a 61yo WM who lives alone and presents for Total Thyroidectomy on 08/27/17 Pt is current smoker with mild to mod COPD (PFTs) and was referred by Dr Lamonte Sakai after positive thyroid nodule biopsy during work up for Pulmonary nodule. He does describe his throat closing when sleeping on his back but he just turns on his side and goes back to sleep. He has not had a sleep study. BMI 31.6  He is currently taking Advair, Atenolol and Prilosec.  O/E  Airway OK  PLAN: He is OK from our standpoint to undergo a GA staying overnight. ( 3 years ago he was anesthetized for a stomach operation when he was 30lbs heavier without problem in [facility])  Consider sleep study to quantitate sleep apnea after surgery.  Timothy Abed MD

## 2017-08-27 ENCOUNTER — Ambulatory Visit (HOSPITAL_COMMUNITY): Payer: Medicare HMO | Admitting: Anesthesiology

## 2017-08-27 ENCOUNTER — Encounter (HOSPITAL_COMMUNITY)
Admission: RE | Disposition: A | Discharge status: Home / Self Care | Payer: Self-pay | Source: Ambulatory Visit | Attending: Surgery

## 2017-08-27 ENCOUNTER — Observation Stay (HOSPITAL_COMMUNITY)
Admission: RE | Admit: 2017-08-27 | Discharge: 2017-08-28 | Disposition: A | Discharge status: Home / Self Care | Payer: Medicare HMO | Source: Ambulatory Visit | Attending: Surgery | Admitting: Surgery

## 2017-08-27 ENCOUNTER — Encounter (HOSPITAL_COMMUNITY): Payer: Self-pay | Admitting: *Deleted

## 2017-08-27 DIAGNOSIS — F17200 Nicotine dependence, unspecified, uncomplicated: Secondary | ICD-10-CM | POA: Insufficient documentation

## 2017-08-27 DIAGNOSIS — I10 Essential (primary) hypertension: Secondary | ICD-10-CM | POA: Diagnosis not present

## 2017-08-27 DIAGNOSIS — Z6831 Body mass index (BMI) 31.0-31.9, adult: Secondary | ICD-10-CM | POA: Insufficient documentation

## 2017-08-27 DIAGNOSIS — F329 Major depressive disorder, single episode, unspecified: Secondary | ICD-10-CM | POA: Diagnosis not present

## 2017-08-27 DIAGNOSIS — C73 Malignant neoplasm of thyroid gland: Secondary | ICD-10-CM | POA: Diagnosis not present

## 2017-08-27 DIAGNOSIS — Z79899 Other long term (current) drug therapy: Secondary | ICD-10-CM | POA: Insufficient documentation

## 2017-08-27 DIAGNOSIS — J449 Chronic obstructive pulmonary disease, unspecified: Secondary | ICD-10-CM | POA: Diagnosis not present

## 2017-08-27 DIAGNOSIS — R911 Solitary pulmonary nodule: Secondary | ICD-10-CM | POA: Diagnosis not present

## 2017-08-27 DIAGNOSIS — E7800 Pure hypercholesterolemia, unspecified: Secondary | ICD-10-CM | POA: Insufficient documentation

## 2017-08-27 DIAGNOSIS — F419 Anxiety disorder, unspecified: Secondary | ICD-10-CM | POA: Insufficient documentation

## 2017-08-27 DIAGNOSIS — Z23 Encounter for immunization: Secondary | ICD-10-CM | POA: Insufficient documentation

## 2017-08-27 HISTORY — PX: THYROIDECTOMY: SHX17

## 2017-08-27 SURGERY — THYROIDECTOMY
Anesthesia: General | Site: Neck

## 2017-08-27 MED ORDER — LIDOCAINE 2% (20 MG/ML) 5 ML SYRINGE
INTRAMUSCULAR | Status: AC
  Filled 2017-08-27: qty 5

## 2017-08-27 MED ORDER — ROCURONIUM BROMIDE 10 MG/ML (PF) SYRINGE
PREFILLED_SYRINGE | INTRAVENOUS | Status: DC | PRN
  Administered 2017-08-27: 50 mg via INTRAVENOUS
  Administered 2017-08-27: 20 mg via INTRAVENOUS

## 2017-08-27 MED ORDER — HYDROMORPHONE HCL-NACL 0.5-0.9 MG/ML-% IV SOSY: 1.0000 mg | PREFILLED_SYRINGE | INTRAVENOUS | Status: DC | PRN

## 2017-08-27 MED ORDER — 0.9 % SODIUM CHLORIDE (POUR BTL) OPTIME
TOPICAL | Status: DC | PRN
Start: 2017-08-27 — End: 2017-08-27
  Administered 2017-08-27: 1000 mL

## 2017-08-27 MED ORDER — SYNTHROID 100 MCG PO TABS: 100.0000 ug | ORAL_TABLET | Freq: Every day | ORAL | 3 refills | Status: DC

## 2017-08-27 MED ORDER — FENTANYL CITRATE (PF) 250 MCG/5ML IJ SOLN
INTRAMUSCULAR | Status: AC
  Filled 2017-08-27: qty 5

## 2017-08-27 MED ORDER — SUGAMMADEX SODIUM 200 MG/2ML IV SOLN
INTRAVENOUS | Status: DC | PRN
  Administered 2017-08-27: 200 mg via INTRAVENOUS

## 2017-08-27 MED ORDER — MOMETASONE FURO-FORMOTEROL FUM 200-5 MCG/ACT IN AERO
2.0000 | INHALATION_SPRAY | Freq: Two times a day (BID) | RESPIRATORY_TRACT | Status: DC
  Administered 2017-08-27: 22:00:00 2 via RESPIRATORY_TRACT
  Filled 2017-08-27 (×2): qty 8.8

## 2017-08-27 MED ORDER — LACTATED RINGERS IV SOLN
INTRAVENOUS | Status: DC
  Administered 2017-08-27 (×3): via INTRAVENOUS

## 2017-08-27 MED ORDER — CALCIUM CARBONATE ANTACID 500 MG PO CHEW: 2.0000 | CHEWABLE_TABLET | Freq: Three times a day (TID) | ORAL | 1 refills | Status: DC

## 2017-08-27 MED ORDER — LIDOCAINE 2% (20 MG/ML) 5 ML SYRINGE
INTRAMUSCULAR | Status: DC | PRN
  Administered 2017-08-27: 100 mg via INTRAVENOUS

## 2017-08-27 MED ORDER — OXYCODONE HCL 5 MG PO TABS: 5.0000 mg | ORAL_TABLET | Freq: Once | ORAL | Status: DC | PRN

## 2017-08-27 MED ORDER — CEFAZOLIN SODIUM-DEXTROSE 2-4 GM/100ML-% IV SOLN
2.0000 g | INTRAVENOUS | Status: AC
  Administered 2017-08-27: 2 g via INTRAVENOUS
  Filled 2017-08-27: qty 100

## 2017-08-27 MED ORDER — ONDANSETRON HCL 4 MG/2ML IJ SOLN: 4.0000 mg | Freq: Four times a day (QID) | INTRAMUSCULAR | Status: DC | PRN

## 2017-08-27 MED ORDER — HYDROCODONE-ACETAMINOPHEN 5-325 MG PO TABS
1.0000 | ORAL_TABLET | ORAL | Status: DC | PRN
  Administered 2017-08-27 – 2017-08-28 (×4): 2 via ORAL
  Filled 2017-08-27 (×4): qty 2

## 2017-08-27 MED ORDER — ROCURONIUM BROMIDE 50 MG/5ML IV SOSY
PREFILLED_SYRINGE | INTRAVENOUS | Status: AC
  Filled 2017-08-27: qty 5

## 2017-08-27 MED ORDER — DEXAMETHASONE SODIUM PHOSPHATE 10 MG/ML IJ SOLN
INTRAMUSCULAR | Status: DC | PRN
  Administered 2017-08-27: 10 mg via INTRAVENOUS

## 2017-08-27 MED ORDER — OXYCODONE HCL 5 MG/5ML PO SOLN: 5.0000 mg | Freq: Once | ORAL | Status: DC | PRN

## 2017-08-27 MED ORDER — FLUTICASONE PROPIONATE 50 MCG/ACT NA SUSP
2.0000 | Freq: Every day | NASAL | Status: DC | PRN
  Filled 2017-08-27: qty 16

## 2017-08-27 MED ORDER — HYDROCODONE-ACETAMINOPHEN 5-325 MG PO TABS: 1.0000 | ORAL_TABLET | ORAL | 0 refills | Status: DC | PRN

## 2017-08-27 MED ORDER — ONDANSETRON HCL 4 MG/2ML IJ SOLN
INTRAMUSCULAR | Status: AC
  Filled 2017-08-27: qty 2

## 2017-08-27 MED ORDER — PROPOFOL 10 MG/ML IV BOLUS
INTRAVENOUS | Status: AC
  Filled 2017-08-27: qty 20

## 2017-08-27 MED ORDER — ACETAMINOPHEN 650 MG RE SUPP: 650.0000 mg | Freq: Four times a day (QID) | RECTAL | Status: DC | PRN

## 2017-08-27 MED ORDER — EPHEDRINE SULFATE-NACL 50-0.9 MG/10ML-% IV SOSY
PREFILLED_SYRINGE | INTRAVENOUS | Status: DC | PRN
  Administered 2017-08-27: 10 mg via INTRAVENOUS

## 2017-08-27 MED ORDER — HYDROMORPHONE HCL-NACL 0.5-0.9 MG/ML-% IV SOSY
PREFILLED_SYRINGE | INTRAVENOUS | Status: AC
  Administered 2017-08-27: 0.5 mg via INTRAVENOUS
  Filled 2017-08-27: qty 4

## 2017-08-27 MED ORDER — SUGAMMADEX SODIUM 200 MG/2ML IV SOLN
INTRAVENOUS | Status: AC
  Filled 2017-08-27: qty 2

## 2017-08-27 MED ORDER — CHLORHEXIDINE GLUCONATE CLOTH 2 % EX PADS: 6.0000 | MEDICATED_PAD | Freq: Once | CUTANEOUS | Status: DC

## 2017-08-27 MED ORDER — CALCIUM CARBONATE 1250 (500 CA) MG PO TABS
2.0000 | ORAL_TABLET | Freq: Three times a day (TID) | ORAL | Status: DC
  Administered 2017-08-27 – 2017-08-28 (×2): 1000 mg via ORAL
  Filled 2017-08-27 (×2): qty 1

## 2017-08-27 MED ORDER — MIDAZOLAM HCL 5 MG/5ML IJ SOLN
INTRAMUSCULAR | Status: DC | PRN
  Administered 2017-08-27: 2 mg via INTRAVENOUS

## 2017-08-27 MED ORDER — KCL IN DEXTROSE-NACL 20-5-0.45 MEQ/L-%-% IV SOLN
INTRAVENOUS | Status: DC
  Administered 2017-08-27: 16:00:00 via INTRAVENOUS
  Filled 2017-08-27 (×2): qty 1000

## 2017-08-27 MED ORDER — ALPRAZOLAM 1 MG PO TABS: 1.0000 mg | ORAL_TABLET | Freq: Three times a day (TID) | ORAL | Status: DC | PRN

## 2017-08-27 MED ORDER — INFLUENZA VAC SPLIT QUAD 0.5 ML IM SUSY
0.5000 mL | PREFILLED_SYRINGE | INTRAMUSCULAR | Status: AC
  Administered 2017-08-28: 0.5 mL via INTRAMUSCULAR
  Filled 2017-08-27: qty 0.5

## 2017-08-27 MED ORDER — ONDANSETRON 4 MG PO TBDP: 4.0000 mg | ORAL_TABLET | Freq: Four times a day (QID) | ORAL | Status: DC | PRN

## 2017-08-27 MED ORDER — ACETAMINOPHEN 325 MG PO TABS: 650.0000 mg | ORAL_TABLET | Freq: Four times a day (QID) | ORAL | Status: DC | PRN

## 2017-08-27 MED ORDER — MIDAZOLAM HCL 2 MG/2ML IJ SOLN
INTRAMUSCULAR | Status: AC
  Filled 2017-08-27: qty 2

## 2017-08-27 MED ORDER — ATENOLOL 50 MG PO TABS
100.0000 mg | ORAL_TABLET | Freq: Two times a day (BID) | ORAL | Status: DC
  Administered 2017-08-27 – 2017-08-28 (×2): 100 mg via ORAL
  Filled 2017-08-27 (×2): qty 2

## 2017-08-27 MED ORDER — DEXAMETHASONE SODIUM PHOSPHATE 10 MG/ML IJ SOLN
INTRAMUSCULAR | Status: AC
  Filled 2017-08-27: qty 1

## 2017-08-27 MED ORDER — FENTANYL CITRATE (PF) 100 MCG/2ML IJ SOLN
INTRAMUSCULAR | Status: DC | PRN
  Administered 2017-08-27: 50 ug via INTRAVENOUS
  Administered 2017-08-27: 100 ug via INTRAVENOUS
  Administered 2017-08-27 (×2): 50 ug via INTRAVENOUS

## 2017-08-27 MED ORDER — PROPOFOL 10 MG/ML IV BOLUS
INTRAVENOUS | Status: DC | PRN
  Administered 2017-08-27: 180 mg via INTRAVENOUS

## 2017-08-27 MED ORDER — HYDROMORPHONE HCL-NACL 0.5-0.9 MG/ML-% IV SOSY
0.2500 mg | PREFILLED_SYRINGE | INTRAVENOUS | Status: DC | PRN
  Administered 2017-08-27 (×4): 0.5 mg via INTRAVENOUS

## 2017-08-27 MED ORDER — ONDANSETRON HCL 4 MG/2ML IJ SOLN
INTRAMUSCULAR | Status: DC | PRN
  Administered 2017-08-27: 4 mg via INTRAVENOUS

## 2017-08-27 SURGICAL SUPPLY — 39 items
APL SKNCLS STERI-STRIP NONHPOA (GAUZE/BANDAGES/DRESSINGS) ×1
ATTRACTOMAT 16X20 MAGNETIC DRP (DRAPES) ×3 IMPLANT
BENZOIN TINCTURE PRP APPL 2/3 (GAUZE/BANDAGES/DRESSINGS) ×2 IMPLANT
BLADE SURG 15 STRL LF DISP TIS (BLADE) ×1 IMPLANT
BLADE SURG 15 STRL SS (BLADE) ×3
CHLORAPREP W/TINT 26ML (MISCELLANEOUS) ×6 IMPLANT
CLIP VESOCCLUDE MED 6/CT (CLIP) ×10 IMPLANT
CLIP VESOCCLUDE SM WIDE 6/CT (CLIP) ×8 IMPLANT
CLOSURE STERI-STRIP 1/4X4 (GAUZE/BANDAGES/DRESSINGS) ×2 IMPLANT
CLOSURE WOUND 1/2 X4 (GAUZE/BANDAGES/DRESSINGS) ×1
COVER SURGICAL LIGHT HANDLE (MISCELLANEOUS) ×3 IMPLANT
DISSECTOR ROUND CHERRY 3/8 STR (MISCELLANEOUS) ×2 IMPLANT
DRAPE LAPAROTOMY T 98X78 PEDS (DRAPES) ×3 IMPLANT
ELECT PENCIL ROCKER SW 15FT (MISCELLANEOUS) ×3 IMPLANT
ELECT REM PT RETURN 15FT ADLT (MISCELLANEOUS) ×3 IMPLANT
GAUZE SPONGE 4X4 12PLY STRL (GAUZE/BANDAGES/DRESSINGS) ×2 IMPLANT
GAUZE SPONGE 4X4 16PLY XRAY LF (GAUZE/BANDAGES/DRESSINGS) ×3 IMPLANT
GLOVE SURG ORTHO 8.0 STRL STRW (GLOVE) ×3 IMPLANT
GOWN STRL REUS W/TWL XL LVL3 (GOWN DISPOSABLE) ×6 IMPLANT
HEMOSTAT SURGICEL 2X4 FIBR (HEMOSTASIS) IMPLANT
ILLUMINATOR WAVEGUIDE N/F (MISCELLANEOUS) ×2 IMPLANT
KIT BASIN OR (CUSTOM PROCEDURE TRAY) ×3 IMPLANT
LIGHT WAVEGUIDE WIDE FLAT (MISCELLANEOUS) IMPLANT
PACK BASIC VI WITH GOWN DISP (CUSTOM PROCEDURE TRAY) ×3 IMPLANT
POWDER SURGICEL 3.0 GRAM (HEMOSTASIS) IMPLANT
SHEARS HARMONIC 9CM CVD (BLADE) ×3 IMPLANT
STAPLER VISISTAT 35W (STAPLE) IMPLANT
STRIP CLOSURE SKIN 1/2X4 (GAUZE/BANDAGES/DRESSINGS) ×2 IMPLANT
SUT MNCRL AB 4-0 PS2 18 (SUTURE) ×3 IMPLANT
SUT SILK 2 0 (SUTURE)
SUT SILK 2-0 18XBRD TIE 12 (SUTURE) IMPLANT
SUT SILK 3 0 (SUTURE)
SUT SILK 3-0 18XBRD TIE 12 (SUTURE) IMPLANT
SUT VIC AB 3-0 SH 18 (SUTURE) ×6 IMPLANT
SYR BULB IRRIGATION 50ML (SYRINGE) ×3 IMPLANT
TAPE CLOTH SURG 4X10 WHT LF (GAUZE/BANDAGES/DRESSINGS) ×2 IMPLANT
TOWEL OR 17X26 10 PK STRL BLUE (TOWEL DISPOSABLE) ×3 IMPLANT
TOWEL OR NON WOVEN STRL DISP B (DISPOSABLE) ×3 IMPLANT
YANKAUER SUCT BULB TIP 10FT TU (MISCELLANEOUS) ×3 IMPLANT

## 2017-08-27 NOTE — Anesthesia Preprocedure Evaluation (Signed)
Anesthesia Evaluation  Patient identified by MRN, date of birth, ID band Patient awake    Reviewed: Allergy & Precautions, NPO status , Patient's Chart, lab work & pertinent test results  Airway Mallampati: II  TM Distance: >3 FB Neck ROM: Full    Dental no notable dental hx.    Pulmonary COPD, Current Smoker,    breath sounds clear to auscultation       Cardiovascular hypertension, + dysrhythmias  Rhythm:Regular Rate:Normal     Neuro/Psych negative neurological ROS     GI/Hepatic negative GI ROS, Neg liver ROS,   Endo/Other  Morbid obesity  Renal/GU negative Renal ROS     Musculoskeletal negative musculoskeletal ROS (+)   Abdominal   Peds  Hematology negative hematology ROS (+)   Anesthesia Other Findings   Reproductive/Obstetrics                             Anesthesia Physical Anesthesia Plan  ASA: III  Anesthesia Plan: General   Post-op Pain Management:    Induction: Intravenous  PONV Risk Score and Plan: 2 and Ondansetron, Dexamethasone and Treatment may vary due to age or medical condition  Airway Management Planned: Oral ETT  Additional Equipment:   Intra-op Plan:   Post-operative Plan: Extubation in OR  Informed Consent: I have reviewed the patients History and Physical, chart, labs and discussed the procedure including the risks, benefits and alternatives for the proposed anesthesia with the patient or authorized representative who has indicated his/her understanding and acceptance.   Dental advisory given  Plan Discussed with: CRNA  Anesthesia Plan Comments:         Anesthesia Quick Evaluation

## 2017-08-27 NOTE — Op Note (Signed)
Procedure Note  Pre-operative Diagnosis:  Papillary thyroid carcinoma  Post-operative Diagnosis:  same  Surgeon:  Earnstine Regal, MD, FACS  Assistant:  Isabel Caprice. Hassell Done, MD, FACS   Procedure:  Total thyroidectomy with limited central compartment lymph node dissection  Anesthesia:  General  Estimated Blood Loss:  minimal  Drains: none         Specimen: thyroid to pathology  Indications:  The patient is a 61 year old male who presents with thyroid cancer.  Patient is referred by Dr. Baltazar Apo for evaluation and management of newly diagnosed papillary thyroid carcinoma. Patient is followed for COPD. He has regular CT scans of the chest. A pulmonary nodule was noted to have enlarged. Patient underwent a PET scan which showed that the pulmonary nodule was likely benign but showed an active lesion in the left lobe of the thyroid. Patient underwent thyroid ultrasound on July 30, 2017. This showed a 1.3 cm suspicious nodule in the left thyroid lobe. There was a 5 mm nodule in the right thyroid lobe. Thyroid gland was normal in size. Fine-needle aspiration biopsy of the left lobe nodule was performed and found to have papillary thyroid carcinoma, Bethesda category VI. Patient is now referred for evaluation for thyroidectomy for treatment of papillary thyroid carcinoma. Patient has no prior history of thyroid disease. He is never been on thyroid medication. There is no family history of endocrine neoplasm. Patient has had no prior head or neck surgery.  Procedure Details: Procedure was done in OR #1 at the [facility].  The patient was brought to the operating room and placed in a supine position on the operating room table.  Following administration of general anesthesia, the patient was positioned and then prepped and draped in the usual aseptic fashion.  After ascertaining that an adequate level of anesthesia had been achieved, a Kocher incision was made with #15 blade.   Dissection was carried through subcutaneous tissues and platysma. Hemostasis was achieved with the electrocautery.  Skin flaps were elevated cephalad and caudad from the thyroid notch to the sternal notch.  The Mahorner self-retaining retractor was placed for exposure.  Strap muscles were incised in the midline and dissection was begun on the left side.  Strap muscles were reflected laterally.  Left thyroid lobe was normal in size with a very firm central 1.5 cm nodule consistent with the biopsy proven carcinoma.  The left lobe was gently mobilized with blunt dissection.  Superior pole vessels were dissected out and divided individually between small and medium Ligaclips with the Harmonic scalpel.  The thyroid lobe was rolled anteriorly.  Branches of the inferior thyroid artery were divided between small Ligaclips with the Harmonic scalpel.  Inferior venous tributaries were divided between Ligaclips.  Both the superior and inferior parathyroid glands were identified and preserved on their vascular pedicles.  The recurrent laryngeal nerve was identified and preserved along its course.  The ligament of Gwenlyn Found was released with the electrocautery and the gland was mobilized onto the anterior trachea. Isthmus was mobilized across the midline.  There was no pyramidal lobe present.  Dry pack was placed in the left neck.  Next, the right thyroid lobe was gently mobilized with blunt dissection.  Right thyroid lobe was normal in size with small nodularity.  Superior pole vessels were dissected out and divided between small and medium Ligaclips with the Harmonic scalpel.  Superior parathyroid was identified and preserved.  Inferior venous tributaries were divided between medium Ligaclips with the Harmonic  scalpel.  The right thyroid lobe was rolled anteriorly and the branches of the inferior thyroid artery divided between small Ligaclips.  The right recurrent laryngeal nerve was identified and preserved along its course.   The ligament of Gwenlyn Found was released with the electrocautery.  The right thyroid lobe was mobilized onto the anterior trachea and the remainder of the thyroid was dissected off the anterior trachea and the thyroid was completely excised.  A suture was used to mark the left lobe. The entire thyroid gland was submitted to pathology for review.  Central compartment lymph nodes were removed with the electrocautery and harmonic scalpel used to excise tissue from the right carotid across anterior to the trachea to the left carotid an inferiorly to the innominate.  Tissue was submitted to pathology for permanent review.  Good hemostasis was noted.  The neck was irrigated with warm saline.  Fibrillar was placed throughout the operative field.  Strap muscles were reapproximated in the midline with interrupted 3-0 Vicryl sutures.  Platysma was closed with interrupted 3-0 Vicryl sutures.  Skin was closed with a running 4-0 Monocryl subcuticular suture.  Wound was washed and dried and steri-strips were applied.  Dry gauze dressing was placed.  The patient was awakened from anesthesia and brought to the recovery room.  The patient tolerated the procedure well.   Earnstine Regal, MD, [facility] Office: [phone]

## 2017-08-27 NOTE — Interval H&P Note (Signed)
History and Physical Interval Note:  08/27/2017 9:22 AM  Timothy Conway  has presented today for surgery, with the diagnosis of papillary thyroid carcinoma  The various methods of treatment have been discussed with the patient and family. After consideration of risks, benefits and other options for treatment, the patient has consented to    Procedure(s): TOTAL THYROIDECTOMY WITH LIMITED LYMPH NODE DISSECTION (N/A) as a surgical intervention .    The patient's history has been reviewed, patient examined, no change in status, stable for surgery.  I have reviewed the patient's chart and labs.  Questions were answered to the patient's satisfaction.    Earnstine Regal, MD, [facility] Office: [place]

## 2017-08-27 NOTE — Anesthesia Procedure Notes (Signed)
Procedure Name: Intubation Date/Time: 08/27/2017 9:52 AM Performed by: Noralyn Pick D Pre-anesthesia Checklist: Patient identified, Emergency Drugs available, Suction available and Patient being monitored Patient Re-evaluated:Patient Re-evaluated prior to induction Oxygen Delivery Method: Circle system utilized Preoxygenation: Pre-oxygenation with 100% oxygen Induction Type: IV induction Ventilation: Mask ventilation without difficulty Laryngoscope Size: Miller and 2 Grade View: Grade II Tube type: Oral Tube size: 7.5 mm Number of attempts: 1 Airway Equipment and Method: Stylet Placement Confirmation: ETT inserted through vocal cords under direct vision,  positive ETCO2 and breath sounds checked- equal and bilateral Tube secured with: Tape Dental Injury: Teeth and Oropharynx as per pre-operative assessment

## 2017-08-27 NOTE — Transfer of Care (Signed)
Immediate Anesthesia Transfer of Care Note  Patient: Timothy Conway  Procedure(s) Performed: Procedure(s): TOTAL THYROIDECTOMY WITH LIMITED LYMPH NODE DISSECTION (N/A)  Patient Location: PACU  Anesthesia Type:General  Level of Consciousness: awake, alert  and oriented  Airway & Oxygen Therapy: Patient Spontanous Breathing and Patient connected to face mask oxygen  Post-op Assessment: Report given to RN and Post -op Vital signs reviewed and stable  Post vital signs: Reviewed and stable  Last Vitals:  Vitals:   08/27/17 0720 08/27/17 1153  BP: 124/73 (!) 160/85  Pulse: 66 70  Resp: 16 16  Temp: 36.9 C 36.9 C  SpO2: 97% 100%    Last Pain:  Vitals:   08/27/17 0720  TempSrc: Oral      Patients Stated Pain Goal: 5 (50/56/97 9480)  Complications: No apparent anesthesia complications

## 2017-08-28 DIAGNOSIS — F329 Major depressive disorder, single episode, unspecified: Secondary | ICD-10-CM | POA: Diagnosis not present

## 2017-08-28 DIAGNOSIS — J449 Chronic obstructive pulmonary disease, unspecified: Secondary | ICD-10-CM | POA: Diagnosis not present

## 2017-08-28 DIAGNOSIS — I10 Essential (primary) hypertension: Secondary | ICD-10-CM | POA: Diagnosis not present

## 2017-08-28 DIAGNOSIS — C73 Malignant neoplasm of thyroid gland: Secondary | ICD-10-CM | POA: Diagnosis not present

## 2017-08-28 DIAGNOSIS — E7800 Pure hypercholesterolemia, unspecified: Secondary | ICD-10-CM | POA: Diagnosis not present

## 2017-08-28 DIAGNOSIS — Z6831 Body mass index (BMI) 31.0-31.9, adult: Secondary | ICD-10-CM | POA: Diagnosis not present

## 2017-08-28 DIAGNOSIS — F419 Anxiety disorder, unspecified: Secondary | ICD-10-CM | POA: Diagnosis not present

## 2017-08-28 DIAGNOSIS — Z23 Encounter for immunization: Secondary | ICD-10-CM | POA: Diagnosis not present

## 2017-08-28 LAB — BASIC METABOLIC PANEL
Anion gap: 7 (ref 5–15)
BUN: 13 mg/dL (ref 6–20)
CALCIUM: 8.3 mg/dL — AB (ref 8.9–10.3)
CO2: 28 mmol/L (ref 22–32)
CREATININE: 1.01 mg/dL (ref 0.61–1.24)
Chloride: 103 mmol/L (ref 101–111)
GFR calc Af Amer: >60 mL/min (ref >60)
GLUCOSE: 121 mg/dL — AB (ref 65–99)
Potassium: 4.5 mmol/L (ref 3.5–5.1)
Sodium: 138 mmol/L (ref 135–145)

## 2017-08-28 NOTE — Discharge Summary (Signed)
Physician Discharge Summary  Patient ID: Timothy Conway MRN: [id number] DOB/AGE: 1956/06/15 61 y.o.  Admit date: 08/27/2017 Discharge date: 08/28/2017  Admission Diagnoses:  Thyroid cancer  Discharge Diagnoses:  Same path pending  Principal Problem:   Papillary thyroid carcinoma s/p total thyroidectomy & CLND 08/27/2017   Surgery:  Total thyroidectomy and central compartment lymph node dissection  Discharged Condition: improved  Hospital Course:   Had surgery on Friday.  Wound ok with steristrips in place.  Calcium 8.3.  Voice OK.  No numbness.  Ready for discharge.    Consults: none  Significant Diagnostic Studies: path pending    Discharge Exam: Blood pressure 122/72, pulse 70, temperature 97.8 F (36.6 C), temperature source Oral, resp. rate 18, height 6' (1.829 m), weight 105.7 kg (233 lb), SpO2 98 %. Incision dry and without significant swelling;  Voice slightly hoarse but strong  Disposition: Final discharge disposition not confirmed  Discharge Instructions    Diet - low sodium heart healthy    Complete by:  As directed    Discharge instructions    Complete by:  As directed    TUMs are calcium supplement;   Increase activity slowly    Complete by:  As directed      Allergies as of 08/28/2017   No Known Allergies     Medication List    TAKE these medications   ADVAIR DISKUS 250-50 MCG/DOSE Aepb Generic drug:  Fluticasone-Salmeterol Inhale 1 puff into the lungs 2 (two) times daily.   ALPRAZolam 1 MG tablet Commonly known as:  XANAX Take 1 mg by mouth 3 (three) times daily as needed for anxiety.   ARIPiprazole 5 MG tablet Commonly known as:  ABILIFY Take 5 mg by mouth at bedtime.   atenolol 100 MG tablet Commonly known as:  TENORMIN Take 100 mg by mouth 2 (two) times daily.   calcium carbonate 500 MG chewable tablet Commonly known as:  TUMS Chew 2 tablets (400 mg of elemental calcium total) by mouth 3 (three) times daily.   famotidine 20 MG  tablet Commonly known as:  PEPCID Take 20 mg by mouth 2 (two) times daily.   fluticasone 50 MCG/ACT nasal spray Commonly known as:  FLONASE Place 2 sprays into both nostrils daily as needed for allergies or rhinitis.   gemfibrozil 600 MG tablet Commonly known as:  LOPID Take 600 mg by mouth 2 (two) times daily before a meal.   HYDROcodone-acetaminophen 5-325 MG tablet Commonly known as:  NORCO/VICODIN Take 1-2 tablets by mouth every 4 (four) hours as needed for moderate pain.   multivitamin with minerals Tabs tablet Take 1 tablet by mouth daily.   SYNTHROID 100 MCG tablet Generic drug:  levothyroxine Take 1 tablet (100 mcg total) by mouth daily.   Tiotropium Bromide Monohydrate 2.5 MCG/ACT Aers Commonly known as:  SPIRIVA RESPIMAT Inhale 2 puffs into the lungs daily.   Vitamin D 2000 units tablet Take 2,000 Units by mouth daily.            Discharge Care Instructions        Start     Ordered   08/28/17 0000  Diet - low sodium heart healthy     08/28/17 0835   08/28/17 0000  Increase activity slowly     08/28/17 0835   08/28/17 0000  Discharge instructions    Comments:  TUMs are calcium supplement;   08/28/17 0835   08/27/17 0000  HYDROcodone-acetaminophen (NORCO/VICODIN) 5-325 MG tablet  Every 4 hours PRN  08/27/17 1201   08/27/17 0000  SYNTHROID 100 MCG tablet  Daily     08/27/17 1201   08/27/17 0000  calcium carbonate (TUMS) 500 MG chewable tablet  3 times daily     08/27/17 1201     Follow-up Information    Armandina Gemma, MD. Schedule an appointment as soon as possible for a visit in 3 week(s).   Specialty:  General Surgery Why:  For wound re-check Contact information: [address] [place] [address] [phone]           Signed: Pedro Earls 08/28/2017, 8:36 AM

## 2017-08-28 NOTE — Care Management Obs Status (Signed)
[place] NOTIFICATION   Patient Details  Name: Timothy Conway MRN: [id number] Date of Birth: 1956/02/07   Medicare Observation Status Notification Given:  Yes    Erenest Rasher, RN 08/28/2017, 12:09 PM

## 2017-08-28 NOTE — Discharge Instructions (Signed)
Thyroid Cancer The thyroid is a butterfly-shaped gland in the neck. The thyroid makes hormones that control functions like heart rate, blood pressure, temperature, and weight. Thyroid cancer is an abnormal growth of cells in the thyroid. These cells grow and multiply rapidly and do not die as normal cells do. There are four main types of thyroid cancer:  Papillary cancer. This is the least harmful type of thyroid cancer. It typically affects women of child-bearing age.  Follicular cancer. This type of cancer is the most likely to come back after treatment and spread to other parts of the body.  Medullary cancer. Medullary cancer can be passed down through families. A person who inherits the gene for this cancer is at high risk for developing the cancer.  Anaplastic cancer. This type of thyroid cancer spreads quickly to the windpipe (trachea) and causes breathing problems. This type of cancer is most common in people 56 years of age or older.  What are the causes? The cause of thyroid cancer is not known. What increases the risk? Risk factors for thyroid cancer include:  Radiation exposure or radiation treatments to your head and neck during infancy or childhood.  Having an enlarged thyroid.  Having a family history of thyroid disease or inheriting family conditions.  Being male.  Being Asian.  What are the signs or symptoms? Symptoms of thyroid cancer may include:  Larger-than-normal thyroid gland.  Lumps or swelling in your neck.  Hoarseness or a change in your voice.  A cough.  Coughing up blood.  Difficulty swallowing.  Shortness of breath.  How is this diagnosed? Your health care provider will examine your thyroid during a physical exam. He or she may order tests such as:  An imaging study using sound waves and a computer (ultrasound).  Blood tests.  A tissue sample test (biopsy).  Other tests may be ordered to see if your cancer has spread. These tests may  include CT, MRI, or PET scans. Your health care provider may see if you have any genetic changes that may put you at risk for other types of cancer. How is this treated? Most thyroid cancers are treated with a surgery to remove most or all of the thyroid gland (thyroidectomy). In some cases, treatment also involves removing the lymph nodes in the neck that are close to the thyroid. After surgery, you may have:  Thyroid hormone therapy. The therapy is done to: ? Replace the hormone in the body that is normally made by the thyroid. ? Suppress a thyroid-stimulating hormone (TSH) that activates the thyroid and makes any remaining cancer cells grow.  Radioactive iodine treatment. This treatment is often used to destroy any remaining thyroid tissue and cancerous tissue that could not be seen and was not removed during the thyroidectomy. This treatment may also be used to treat thyroid cancer that has spread to other parts of the body or has recurred.  External radiation. This is typically done to treat thyroid cancer that has spread to the bones.  Chemotherapy treatment. This is medicine that kills cancer cells and prevents them from growing.  Alcohol ablation. This kills cancer cells by injecting alcohol into the cells.  Biologic therapy or immunotherapy. This uses the bodys own immune system to fight cancer cells.  Follow these instructions at home:  Take medicines only as directed by your health care provider.  Keep all follow-up visits as directed by your health care provider. This is important. Contact a health care provider if:  You  feel nauseous or you vomit.  You have diarrhea.  You have a rash.  You have problems with urinating, such as: ? Having a burning sensation. ? Needing to urinate more often than usual. ? Pain or difficulty urinating. ? Having blood in your urine.  You have a new cough.  You have symptoms of too much thyroid hormone, such as: ? Nervousness or  anxiety. ? Unintentional weight loss. ? Sweating. ? Difficulty sleeping. ? Hair loss. ? Heart palpitations. ? Frequent bowel movements.  You have symptoms of too little thyroid hormone, such as: ? Fatigue. ? Puffiness in your face, hands, or feet. ? Unintentional weight gain. ? Feeling cold. ? Constipation.  You have a fever. Get help right away if:  You have chest pain.  You have shortness of breath.  You suddenly feel too weak or dizzy to stand or walk. This information is not intended to replace advice given to you by your health care provider. Make sure you discuss any questions you have with your health care provider. Document Released: 10/09/2004 Document Revised: 07/19/2016 Document Reviewed: 04/19/2014 Elsevier Interactive Patient Education  2017 [employer].

## 2017-08-28 NOTE — Care Management Note (Signed)
Case Management Note  Patient Details  Name: Timothy Conway MRN: [id number] Date of Birth: May 01, 1956  Subjective/Objective:      Thyroid caner, Total Thyroidectomy              Action/Plan: Discharge Planning: Spoke to pt at bedside. No NCM needs identified.   PCP Tresa Res MD  Expected Discharge Date:  08/28/17               Expected Discharge Plan:  Home/Self Care  In-House Referral:  NA  Discharge planning Services  CM Consult  Post Acute Care Choice:  NA Choice offered to:  NA  DME Arranged:  N/A DME Agency:  NA  HH Arranged:  NA HH Agency:  NA  Status of Service:  Completed, signed off  If discussed at [place] of Stay Meetings, dates discussed:    Additional Comments:  Erenest Rasher, RN 08/28/2017, 12:10 PM

## 2017-08-28 NOTE — Progress Notes (Signed)
Discharged from floor via w/c for transport home by car. Belongings & friend with pt. No changes in assessment. Carletta Feasel, [employer]

## 2017-08-30 ENCOUNTER — Encounter (HOSPITAL_COMMUNITY): Payer: Self-pay | Admitting: Surgery

## 2017-09-03 ENCOUNTER — Ambulatory Visit: Payer: Medicare HMO | Admitting: Emergency Medicine

## 2017-09-12 NOTE — Anesthesia Postprocedure Evaluation (Signed)
Anesthesia Post Note  Patient: Timothy Conway  Procedure(s) Performed: TOTAL THYROIDECTOMY WITH LIMITED LYMPH NODE DISSECTION (N/A Neck)     Patient location during evaluation: PACU Anesthesia Type: General Level of consciousness: awake and sedated Pain management: pain level controlled Vital Signs Assessment: post-procedure vital signs reviewed and stable Respiratory status: spontaneous breathing, nonlabored ventilation, respiratory function stable and patient connected to nasal cannula oxygen Cardiovascular status: blood pressure returned to baseline and stable Postop Assessment: no apparent nausea or vomiting Anesthetic complications: no    Last Vitals:  Vitals:   08/28/17 0558 08/28/17 0917  BP: 122/72 127/71  Pulse: 70 (!) 53  Resp: 18 16  Temp: 36.6 C 36.9 C  SpO2: 98% 100%    Last Pain:  Vitals:   08/28/17 0917  TempSrc: Oral  PainSc:                  [name],[name]

## 2017-09-13 DIAGNOSIS — C73 Malignant neoplasm of thyroid gland: Secondary | ICD-10-CM | POA: Diagnosis not present

## 2017-09-14 DIAGNOSIS — J019 Acute sinusitis, unspecified: Secondary | ICD-10-CM | POA: Diagnosis not present

## 2017-09-14 DIAGNOSIS — Z9889 Other specified postprocedural states: Secondary | ICD-10-CM | POA: Diagnosis not present

## 2017-09-14 DIAGNOSIS — R05 Cough: Secondary | ICD-10-CM | POA: Diagnosis not present

## 2017-09-14 DIAGNOSIS — C73 Malignant neoplasm of thyroid gland: Secondary | ICD-10-CM | POA: Diagnosis not present

## 2017-09-14 DIAGNOSIS — B9689 Other specified bacterial agents as the cause of diseases classified elsewhere: Secondary | ICD-10-CM | POA: Diagnosis not present

## 2017-09-16 ENCOUNTER — Ambulatory Visit: Payer: Medicare HMO | Admitting: Emergency Medicine

## 2017-09-23 ENCOUNTER — Encounter: Payer: Self-pay | Admitting: Emergency Medicine

## 2017-09-23 ENCOUNTER — Ambulatory Visit (INDEPENDENT_AMBULATORY_CARE_PROVIDER_SITE_OTHER): Payer: Medicare HMO | Admitting: Emergency Medicine

## 2017-09-23 VITALS — BP 114/78 | HR 63 | Ht 72.0 in | Wt 232.4 lb

## 2017-09-23 DIAGNOSIS — K639 Disease of intestine, unspecified: Secondary | ICD-10-CM | POA: Diagnosis not present

## 2017-09-23 DIAGNOSIS — C73 Malignant neoplasm of thyroid gland: Secondary | ICD-10-CM

## 2017-09-23 DIAGNOSIS — R911 Solitary pulmonary nodule: Secondary | ICD-10-CM | POA: Diagnosis not present

## 2017-09-23 MED ORDER — TIOTROPIUM BROMIDE MONOHYDRATE 2.5 MCG/ACT IN AERS: 2.0000 | INHALATION_SPRAY | Freq: Every day | RESPIRATORY_TRACT | 0 refills | Status: DC

## 2017-09-23 NOTE — Assessment & Plan Note (Signed)
Resected by Dr. Harlow Asa

## 2017-09-23 NOTE — Patient Instructions (Addendum)
We will repeat your CT scan of the chest in February 2019 We will find samples of Spiriva Respimat.  Try to restart 2 puffs once a day.  We will renew your prescription in January when your insurance allows. If you decide to use Dulera in the interim, use 2 puffs twice a day.  Remember to rinse and gargle after using Get your gastroenterology evaluation soon as planned Congratulations on your goal to stop smoking.  Work on this soon as we have discussed Follow with Dr Lamonte Sakai in February after your CT scan to review the results.

## 2017-09-23 NOTE — Assessment & Plan Note (Signed)
Noted on PET scan.  He is planning for gastroenterology evaluation, probably colonoscopy.  He needed to recover from his thyroid surgery first.  He may ultimately need surgical procedure for this as well.

## 2017-09-23 NOTE — Assessment & Plan Note (Signed)
Groundglass enlarging left upper lobe nodule that was not hypermetabolic on PET scan.  I suspect that this is a slow-growing adenocarcinoma.  We unfortunately also discovered a thyroid abnormality, a cecal abnormality.  We have decided to defer any definitive treatment or diagnostic testing for the left upper lobe nodule while the other issues are dealt with.  I will repeat a CT scan in February.  Depending on the results, depending on his functional capacity and ability to tolerate any intervention after the thyroid and colon issues are addressed we will decide whether to refer him to thoracic surgery versus pursue a biopsy, etc.

## 2017-09-23 NOTE — Progress Notes (Signed)
Subjective:    Patient ID: Timothy Conway, male    DOB: [birth year], 61 y.o.   MRN: [id number]  HPI 61 year old active smoker (40 pack years) with a history of hypertension, hyperlipidemia and presumed COPD. He is also followed by Dr. Trula Slade for an arch penetrating ulcer that has been noted on CT scan of the chest. Also on his serial CTs we've been following a slowly enlarging left upper lobe groundglass nodule. On his most recent CT scan from 06/28/17 that I have personally reviewed the groundglass nodule has increased in size to 0.9 x 0.6 cm, up from 0.5 cm. There is no solid component or semisolid component seen. He is referred for evaluation of the nodule. As mentioned he has been given a diagnosis of COPD, had pulmonary function testing 11/07/15 that I reviewed. This showed mixed obstruction and restriction with moderately severely reduced FEV1, grossly normal diffusion capacity.  He is on Advair 250/50, albuterol as needed.  ROV 09/23/17 --this is a follow-up visit for patient with a history of tobacco, COPD, and a lowly enlarging left upper lobe groundglass nodule concerning for possible adenocarcinoma.  PET scan was performed on 07/13/17.  This showed no significant hypermetabolism in the nodule but there was hypermetabolism of a 1.1 cm left thyroid nodule, 2.0 cm soft tissue density in the cecum. He has since had a thyroidectomy for papillary CA. He is planning for a colonoscopy to evaluate the cecal lesion by GI. His breathing is stable. He is planning to quit smoking soon. He has tried a sample Spiriva, stopped Advair when he ran out. He liked the Spiriva, but can't afford until January. He borrowed some Dulera, hasn't started it yet.                                                                               Review of Systems  Constitutional: Negative for fever and unexpected weight change.  HENT: Negative for congestion, dental problem, ear pain, nosebleeds, postnasal drip, rhinorrhea,  sinus pressure, sneezing, sore throat and trouble swallowing.   Eyes: Negative for redness and itching.  Respiratory: Positive for cough and shortness of breath. Negative for chest tightness and wheezing.   Cardiovascular: Negative for palpitations and leg swelling.  Gastrointestinal: Negative for nausea and vomiting.  Genitourinary: Negative for dysuria.  Musculoskeletal: Negative for joint swelling.  Skin: Negative for rash.  Neurological: Negative for headaches.  Hematological: Does not bruise/bleed easily.  Psychiatric/Behavioral: Negative for dysphoric mood. The patient is not nervous/anxious.    Past Medical History:  Diagnosis Date  . Anxiety   . COPD (chronic obstructive pulmonary disease) ([place])   . Depression   . Dysrhythmia    hx of PAC's  . Gout   . Hyperlipidemia   . Hypertension   . Thyroid cancer ([place])    2018     Family History  Problem Relation Age of Onset  . Adopted: Yes     Social History   Social History  . Marital status: Single    Spouse name: N/A  . Number of children: N/A  . Years of education: N/A   Occupational History  . Not on file.   Social History Main Topics  .  Smoking status: Current Every Day Smoker    Packs/day: 1.00    Years: 40.00    Types: Cigarettes  . Smokeless tobacco: Never Used     Comment: 1 1/2 per day  . Alcohol use 0.6 oz/week    1 Cans of beer per week     Comment: moderate  . Drug use: No  . Sexual activity: Not Currently   Other Topics Concern  . Not on file   Social History Narrative  . 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  No TB exposure or positive skin testing.   No Known Allergies   Outpatient Medications Prior to Visit  Medication Sig Dispense Refill  . ADVAIR DISKUS 250-50 MCG/DOSE AEPB Inhale 1 puff into the lungs 2 (two) times daily.     Marland Kitchen ALPRAZolam  (XANAX) 1 MG tablet Take 1 mg by mouth 3 (three) times daily as needed for anxiety.     . ARIPiprazole (ABILIFY) 5 MG tablet Take 5 mg by mouth at bedtime.     Marland Kitchen atenolol (TENORMIN) 100 MG tablet Take 100 mg by mouth 2 (two) times daily.    . calcium carbonate (TUMS) 500 MG chewable tablet Chew 2 tablets (400 mg of elemental calcium total) by mouth 3 (three) times daily. 90 tablet 1  . Cholecalciferol (VITAMIN D) 2000 units tablet Take 2,000 Units by mouth daily.    . famotidine (PEPCID) 20 MG tablet Take 20 mg by mouth 2 (two) times daily.    . fluticasone (FLONASE) 50 MCG/ACT nasal spray Place 2 sprays into both nostrils daily as needed for allergies or rhinitis.    Marland Kitchen gemfibrozil (LOPID) 600 MG tablet Take 600 mg by mouth 2 (two) times daily before a meal.    . HYDROcodone-acetaminophen (NORCO/VICODIN) 5-325 MG tablet Take 1-2 tablets by mouth every 4 (four) hours as needed for moderate pain. 20 tablet 0  . Multiple Vitamin (MULTIVITAMIN WITH MINERALS) TABS tablet Take 1 tablet by mouth daily.    Marland Kitchen SYNTHROID 100 MCG tablet Take 1 tablet (100 mcg total) by mouth daily. 30 tablet 3  . Tiotropium Bromide Monohydrate (SPIRIVA RESPIMAT) 2.5 MCG/ACT AERS Inhale 2 puffs into the lungs daily. 1 Inhaler 0   No facility-administered medications prior to visit.         Objective:   Physical Exam Vitals:   09/23/17 0944  BP: 114/78  Pulse: 63  SpO2: 98%  Weight: 232 lb 6.4 oz (105.4 kg)  Height: 6' (1.829 m)   Gen: Pleasant, well-nourished, in no distress  ENT: No lesions,  mouth clear,  oropharynx clear, no postnasal drip  Neck: No JVD, no TMG, no carotid bruits  Lungs: No use of accessory muscles, clear, soft wheeze on a forced expiration  Cardiovascular: RRR, heart sounds normal, no murmur or gallops, no peripheral edema  Musculoskeletal: No deformities, no cyanosis or clubbing  Neuro: alert, non focal  Skin: Warm, no lesions or rashes       Assessment & Plan:  Papillary  thyroid carcinoma s/p total thyroidectomy & CLND 08/27/2017 Resected by Dr. Harlow Asa  Cecal lesion Noted on PET scan.  He is planning for gastroenterology evaluation, probably colonoscopy.  He needed to recover from his thyroid surgery first.  He may ultimately need surgical procedure for this as well.  Pulmonary nodule, left Groundglass enlarging left upper lobe  nodule that was not hypermetabolic on PET scan.  I suspect that this is a slow-growing adenocarcinoma.  We unfortunately also discovered a thyroid abnormality, a cecal abnormality.  We have decided to defer any definitive treatment or diagnostic testing for the left upper lobe nodule while the other issues are dealt with.  I will repeat a CT scan in February.  Depending on the results, depending on his functional capacity and ability to tolerate any intervention after the thyroid and colon issues are addressed we will decide whether to refer him to thoracic surgery versus pursue a biopsy, etc.   Baltazar Apo, MD, PhD 09/23/2017, 10:30 AM [facility] [phone] or if no answer [phone]

## 2017-09-29 ENCOUNTER — Telehealth: Payer: Self-pay

## 2017-09-29 NOTE — Telephone Encounter (Signed)
Called patient and advised that we had a sooner appointment on Friday, patient was able to schedule that appointment. Had no other questions.

## 2017-10-01 ENCOUNTER — Encounter: Payer: Self-pay | Admitting: Internal Medicine

## 2017-10-01 ENCOUNTER — Ambulatory Visit (INDEPENDENT_AMBULATORY_CARE_PROVIDER_SITE_OTHER): Payer: Medicare HMO | Admitting: Internal Medicine

## 2017-10-01 VITALS — BP 146/84 | HR 58 | Temp 98.5°F | Ht 72.0 in | Wt 235.8 lb

## 2017-10-01 DIAGNOSIS — C73 Malignant neoplasm of thyroid gland: Secondary | ICD-10-CM

## 2017-10-01 DIAGNOSIS — E890 Postprocedural hypothyroidism: Secondary | ICD-10-CM | POA: Diagnosis not present

## 2017-10-01 HISTORY — DX: Postprocedural hypothyroidism: E89.0

## 2017-10-01 LAB — TSH: TSH: 3.67 u[IU]/mL (ref 0.35–4.50)

## 2017-10-01 LAB — T4, FREE: Free T4: 0.77 ng/dL (ref 0.60–1.60)

## 2017-10-01 MED ORDER — LEVOTHYROXINE SODIUM 112 MCG PO TABS: 112.0000 ug | ORAL_TABLET | Freq: Every day | ORAL | 1 refills | Status: DC

## 2017-10-01 NOTE — Patient Instructions (Signed)
Please continue Synthroid 100 mcg daily.  Take the thyroid hormone every day, with water, at least 30 minutes before breakfast, separated by at least 4 hours from: - acid reflux medications - calcium - iron - multivitamins  Please stop at the lab.  Look up [email or web address].  Please come back for a follow-up appointment in 3 months.

## 2017-10-01 NOTE — Progress Notes (Signed)
Patient ID: Timothy Conway, male   DOB: [birth year], 61 y.o.   MRN: [id number]    HPI  Timothy Conway is a 61 y.o.-year-old male, referred by Dr. Harlow Asa, for management of papillary thyroid cancer and postsurgical hypothyroidism.  Pt. has been found to have a hypermetabolic thyroid nodule on a PET scan (07/13/2017) obtained by Dr. Lamonte Sakai, his pulmonologist, to investigate a lung nodule.  A thyroid U/S (07/30/2017) showed a L lobe 1.3 x 1.0 x 1.2 cm nodule, solid, hypoechoic, lobulated/irregular, with peripheral calcifications. Bx of this nodule (07/30/2017): PTC  Total thyroidectomy (08/27/2017) by Dr. Harlow Asa: Diagnosis 1. Thyroid, thyroidectomy, total - PAPILLARY THYROID CARCINOMA, CLASSIC TYPE, SPANNING 1.4 CM. - NO EXTRATHYROIDAL EXTENSION. - RESECTION MARGINS ARE NEGATIVE. - SEE ONCOLOGY TABLE. 2. Lymph node, biopsy, central compartment - THREE OF THREE LYMPH NODES NEGATIVE FOR CARCINOMA (0/3). - ONE BENIGN PARATHYROID. Microscopic Comment 1. THYROID Specimen: Total thyroid with central compartment lymph nodes. Procedure (including lymph node sampling if applicable): Total thyroidectomy with central lymph node biopsy. Specimen Integrity (intact/fragmented): Intact. Tumor focality: Unifocal. Dominant tumor: Maximum tumor size (cm): 1.4 cm. Tumor laterality: Left. Histologic type (including subtype and/or unique features as applicable): Papillary thyroid carcinoma, classic type. Tumor capsule: Present. Extrathyroidal extension: Absent. Capsular invasion with degree of invasion if present: N/A. Margins: Negative. Lymphatic or vascular invasion: Not identified. Lymph nodes: # examined 3; # positive; 0 Extracapsular extension (if applicable): N/A. TNM code: pT1b, pN0 Non-neoplastic thyroid: Largely unremarkable. Comments: There tumor is largely fibrotic and calcified with only a small portion of viable tumor. Vicente Males MD Pathologist, Electronic Signature (Case signed  08/30/2017)  For her postsurgical hypothyroidism, he was started on Synthroid 100 mcg, taken: - fasting in am or at noon - with water - coffee + milk 1h later - separated by 1h from b'fast  - was on calcium 3x a day - stopped last week (was taking it separated it from Synthroid by >4h) - no iron - no PPIs, but on H2 blocker 2x a day - first dose 1.5h after LT4 - + multivitamins at night  No recent TFTs in the system. No results found for: TSH, FREET4   Pt describes: - + fatigue - + weight gain - 8 lbs since the Sx (not per our scale)  She has ? FH of thyroid disorders. ? FH of thyroid cancer. He was adopted. No h/o radiation tx to head or neck.  ROS: Constitutional: + weight gain + fatigue, no subjective hyperthermia/hypothermia, + poor sleep Eyes: no blurry vision, no xerophthalmia ENT: no sore throat, no nodules palpated in throat, no dysphagia/odynophagia, + hoarseness, + tinnitus Cardiovascular: no CP/SOB/palpitations/leg swelling Respiratory: + cough/SOB Gastrointestinal: no N/V/D/C Musculoskeletal: no muscle/joint aches Skin: no rashes Neurological: no tremors/numbness/tingling/dizziness Psychiatric: + both: depression/anxiety  Past Medical History:  Diagnosis Date  . Anxiety   . COPD (chronic obstructive pulmonary disease) ([place])   . Depression   . Dysrhythmia    hx of PAC's  . Gout   . Hyperlipidemia   . Hypertension   . Thyroid cancer ([place])    2018   Past Surgical History:  Procedure Laterality Date  . ABDOMINAL SURGERY     hemmoraging ulcer  . CATARACT EXTRACTION Right   . FRACTURE SURGERY     Left arm  . THYROIDECTOMY N/A 08/27/2017   Procedure: TOTAL THYROIDECTOMY WITH LIMITED LYMPH NODE DISSECTION;  Surgeon: Armandina Gemma, MD;  Location: [facility];  Service: General;  Laterality: N/A;   Social History  Social History  . Marital status: Single    Spouse name: N/A  . Number of children: 0   Occupational History  . n/a   Social History Main  Topics  . Smoking status: Current Every Day Smoker    Packs/day: 1.00    Years: 40.00    Types: Cigarettes  . Smokeless tobacco: Never Used     Comment: 1 1/2 per day  . Alcohol use 0.6 oz/week    3-4 Cans of beer per day  . Drug use: No   Current Outpatient Prescriptions on File Prior to Visit  Medication Sig Dispense Refill  . ALPRAZolam (XANAX) 1 MG tablet Take 1 mg by mouth 3 (three) times daily as needed for anxiety.     . ARIPiprazole (ABILIFY) 5 MG tablet Take 5 mg by mouth at bedtime.     Marland Kitchen atenolol (TENORMIN) 100 MG tablet Take 100 mg by mouth 2 (two) times daily.    . Cholecalciferol (VITAMIN D) 2000 units tablet Take 2,000 Units by mouth daily.    . famotidine (PEPCID) 20 MG tablet Take 20 mg by mouth 2 (two) times daily.    . fluticasone (FLONASE) 50 MCG/ACT nasal spray Place 2 sprays into both nostrils daily as needed for allergies or rhinitis.    Marland Kitchen gemfibrozil (LOPID) 600 MG tablet Take 600 mg by mouth 2 (two) times daily before a meal.    . Multiple Vitamin (MULTIVITAMIN WITH MINERALS) TABS tablet Take 1 tablet by mouth daily.    Marland Kitchen SYNTHROID 100 MCG tablet Take 1 tablet (100 mcg total) by mouth daily. 30 tablet 3  . Tiotropium Bromide Monohydrate (SPIRIVA RESPIMAT) 2.5 MCG/ACT AERS Inhale 2 puffs into the lungs daily. 2 Inhaler 0   No current facility-administered medications on file prior to visit.    No Known Allergies Family History  Problem Relation Age of Onset  . Adopted: Yes    PE: BP (!) 146/84 (BP Location: Right Arm, Patient Position: Sitting, Cuff Size: Normal)   Pulse (!) 58   Temp 98.5 F (36.9 C) (Oral)   Ht 6' (1.829 m)   Wt 235 lb 12.8 oz (107 kg)   SpO2 97%   BMI 31.98 kg/m  Wt Readings from Last 3 Encounters:  10/01/17 235 lb 12.8 oz (107 kg)  09/23/17 232 lb 6.4 oz (105.4 kg)  08/27/17 233 lb (105.7 kg)   Constitutional: overweight, in NAD Eyes: PERRLA, EOMI, no exophthalmos ENT: moist mucous membranes, thyroidectomy scal healing,  with mild swelling, but no erythema, dysesthesia. He applies cocoa butter. No cervical lymphadenopathy Cardiovascular: RRR, No MRG Respiratory: CTA B Gastrointestinal: abdomen soft, NT, ND, BS+ Musculoskeletal: no deformities, strength intact in all 4 Skin: moist, warm, no rashes Neurological: no tremor with outstretched hands, DTR normal in all 4  ASSESSMENT: 1. Thyroid cancer - see HPI  2. Postsurgical Hypothyroidism  PLAN:  1. Papillary Thyroid Cancer - I had a long discussion with the patient about his recent diagnosis of thyroid cancer. We reviewed together the pathology >> he is low risk as the tumor is classical PTC, not very large, encapsulated, w/o extrathyroidal extension, w/o lymph-vascular invasion and with negative LN Bx's.  - We discussed about the new (2015) ATA guidelines >> we can follow him w/o RAI. I explained  that the main role of RAI tx is to facilitate monitoring in the long run (by checking thyroglobulin as a tumor marker). However, we can follow this even w/o RAI tx and monitor trend. Even  if his cancer recurs in the future and we proceed with salvage tx, prognosis is not changed per more recent studies. - I plan to check his Tg + ATA at next visit and plan for a neck U/S in 1 year. - I reassured him that papillary thyroid cancer is a slow growing cancer with good prognosis; his life expectancy is unlikely to be reduced due to the cancer.   - patient agrees with the plan and appears relieved  2. Patient with h/o total thyroidectomy for cancer, now with iatrogenic hypothyroidism, on levothyroxine therapy. He appears euthyroid, but c/o weight gain and fatigue. I suspect his LT4 dose may be too low and will adjust this based on labs today. - We discussed about correct intake of levothyroxine, fasting, with water, separated by at least 30 minutes from breakfast, and separated by more than 4 hours from calcium, iron, multivitamins, acid reflux medications (PPIs). Advised  him to move the H2 blockers later and to always take LT4 in am. - he would like to start generic LT4 b/c cost >> will send this - will check thyroid tests today: TSH, free T4 - target TSH: LLN - If these are abnormal, he will need to return in 6-8 weeks for repeat labs - If these are normal, we will recheck them at next visit  Patient Instructions  Please continue Synthroid 100 mcg daily.  Take the thyroid hormone every day, with water, at least 30 minutes before breakfast, separated by at least 4 hours from: - acid reflux medications - calcium - iron - multivitamins  Please stop at the lab.  Look up [email or web address].  Please come back for a follow-up appointment in 3 months.  Needs new refill for generic LT4.   Office Visit on 10/01/2017  Component Date Value Ref Range Status  . TSH 10/01/2017 3.67  0.35 - 4.50 uIU/mL Final  . Free T4 10/01/2017 0.77  0.60 - 1.60 ng/dL Final   Comment: Specimens from patients who are undergoing biotin therapy and /or ingesting biotin supplements may contain high levels of biotin.  The higher biotin concentration in these specimens interferes with this Free T4 assay.  Specimens that contain high levels  of biotin may cause false high results for this Free T4 assay.  Please interpret results in light of the total clinical presentation of the patient.     TSH high normal >> will increase LT4 to 112 mcg daily. Will need to come back  for labs in 1.5 mo.  Philemon Kingdom, MD PhD [facility] Endocrinology

## 2017-10-04 ENCOUNTER — Telehealth: Payer: Self-pay | Admitting: *Deleted

## 2017-10-04 NOTE — Telephone Encounter (Signed)
It is not unusual for the sugars to increase with stress, but Let's have him back for a poc HbA1c.

## 2017-10-04 NOTE — Telephone Encounter (Signed)
Please advise. Thank you

## 2017-10-04 NOTE — Telephone Encounter (Signed)
Timothy Conway states he is not feeling well and states that his blood sugar was elevated during surgery .Timothy Conway is requesting Dr. Cruzita Lederer to order full labs on him outside of Thyroid labs to see what is going on. Timothy Conway states his blood sugar has never been elevated. Timothy Conway is requesting a call back  [phone]. Please advise. Thank you

## 2017-10-04 NOTE — Telephone Encounter (Signed)
Called patient and scheduled for a nurse A1C visit.

## 2017-10-06 ENCOUNTER — Ambulatory Visit (INDEPENDENT_AMBULATORY_CARE_PROVIDER_SITE_OTHER): Payer: Medicare HMO

## 2017-10-06 DIAGNOSIS — E890 Postprocedural hypothyroidism: Secondary | ICD-10-CM

## 2017-10-06 LAB — POCT GLYCOSYLATED HEMOGLOBIN (HGB A1C): HEMOGLOBIN A1C: 5.4

## 2017-10-08 NOTE — Addendum Note (Signed)
Addendum  created 10/08/17 0957 by Rica Koyanagi, MD   Sign clinical note

## 2017-10-08 NOTE — Anesthesia Postprocedure Evaluation (Signed)
Anesthesia Post Note  Patient: Timothy Conway  Procedure(s) Performed: TOTAL THYROIDECTOMY WITH LIMITED LYMPH NODE DISSECTION (N/A Neck)     Patient location during evaluation: PACU Anesthesia Type: General Level of consciousness: awake, sedated and patient cooperative Pain management: pain level controlled Vital Signs Assessment: post-procedure vital signs reviewed and stable Respiratory status: spontaneous breathing, nonlabored ventilation, respiratory function stable and patient connected to nasal cannula oxygen Cardiovascular status: blood pressure returned to baseline and stable Postop Assessment: no apparent nausea or vomiting Anesthetic complications: no    Last Vitals:  Vitals:   08/28/17 0558 08/28/17 0917  BP: 122/72 127/71  Pulse: 70 (!) 53  Resp: 18 16  Temp: 36.6 C 36.9 C  SpO2: 98% 100%    Last Pain:  Vitals:   08/28/17 0917  TempSrc: Oral  PainSc:                  Jama Krichbaum,[name]

## 2017-10-27 DIAGNOSIS — D120 Benign neoplasm of cecum: Secondary | ICD-10-CM | POA: Diagnosis not present

## 2017-10-27 DIAGNOSIS — K648 Other hemorrhoids: Secondary | ICD-10-CM | POA: Diagnosis not present

## 2017-10-27 DIAGNOSIS — Z1211 Encounter for screening for malignant neoplasm of colon: Secondary | ICD-10-CM | POA: Diagnosis not present

## 2017-10-27 DIAGNOSIS — D126 Benign neoplasm of colon, unspecified: Secondary | ICD-10-CM | POA: Diagnosis not present

## 2017-10-27 DIAGNOSIS — D123 Benign neoplasm of transverse colon: Secondary | ICD-10-CM | POA: Diagnosis not present

## 2017-11-05 DIAGNOSIS — E039 Hypothyroidism, unspecified: Secondary | ICD-10-CM | POA: Diagnosis not present

## 2017-11-05 DIAGNOSIS — I10 Essential (primary) hypertension: Secondary | ICD-10-CM | POA: Diagnosis not present

## 2017-11-05 DIAGNOSIS — J449 Chronic obstructive pulmonary disease, unspecified: Secondary | ICD-10-CM | POA: Diagnosis not present

## 2017-11-05 DIAGNOSIS — C73 Malignant neoplasm of thyroid gland: Secondary | ICD-10-CM | POA: Diagnosis not present

## 2017-11-11 ENCOUNTER — Other Ambulatory Visit: Payer: Self-pay | Admitting: Internal Medicine

## 2017-11-11 DIAGNOSIS — E890 Postprocedural hypothyroidism: Secondary | ICD-10-CM

## 2017-11-12 ENCOUNTER — Other Ambulatory Visit (INDEPENDENT_AMBULATORY_CARE_PROVIDER_SITE_OTHER): Payer: Medicare HMO

## 2017-11-12 ENCOUNTER — Ambulatory Visit: Payer: Medicare HMO | Admitting: Internal Medicine

## 2017-11-12 DIAGNOSIS — E890 Postprocedural hypothyroidism: Secondary | ICD-10-CM

## 2017-11-12 LAB — TSH: TSH: 2.01 u[IU]/mL (ref 0.35–4.50)

## 2017-11-12 LAB — T4, FREE: Free T4: 0.7 ng/dL (ref 0.60–1.60)

## 2017-12-21 ENCOUNTER — Telehealth: Payer: Self-pay | Admitting: Internal Medicine

## 2017-12-22 ENCOUNTER — Telehealth: Payer: Self-pay | Admitting: Emergency Medicine

## 2017-12-22 MED ORDER — TIOTROPIUM BROMIDE MONOHYDRATE 2.5 MCG/ACT IN AERS: 2.0000 | INHALATION_SPRAY | Freq: Every day | RESPIRATORY_TRACT | 3 refills | Status: DC

## 2017-12-22 NOTE — Telephone Encounter (Signed)
Pt request 90 day supply of Spiriva to be sent to [place].  Rx has been sent to preferred pharmacy. Nothing further is needed.

## 2017-12-27 ENCOUNTER — Other Ambulatory Visit: Payer: Self-pay | Admitting: Internal Medicine

## 2017-12-31 ENCOUNTER — Ambulatory Visit: Payer: Medicare HMO | Admitting: Internal Medicine

## 2017-12-31 NOTE — Progress Notes (Deleted)
Patient ID: Timothy Conway, male   DOB: Apr 09, 1956, 62 y.o.   MRN: [id number]    HPI  Timothy Conway is a 62 y.o.-year-old male, initially referred by Dr. Harlow Asa, returning for follow-up for of papillary thyroid cancer and postsurgical hypothyroidism.  Last visit 3 months ago.  Reviewed PTC history: Pt. has been found to have a hypermetabolic thyroid nodule on a PET scan (07/13/2017) obtained by Dr. Lamonte Sakai, his pulmonologist, to investigate a lung nodule. A thyroid U/S (07/30/2017) showed a L lobe 1.3 x 1.0 x 1.2 cm nodule, solid, hypoechoic, lobulated/irregular, with peripheral calcifications. Bx of this nodule (07/30/2017): PTC Total thyroidectomy (08/27/2017) by Dr. Harlow Asa: Diagnosis 1. Thyroid, thyroidectomy, total - PAPILLARY THYROID CARCINOMA, CLASSIC TYPE, SPANNING 1.4 CM. - NO EXTRATHYROIDAL EXTENSION. - RESECTION MARGINS ARE NEGATIVE. - SEE ONCOLOGY TABLE. 2. Lymph node, biopsy, central compartment - THREE OF THREE LYMPH NODES NEGATIVE FOR CARCINOMA (0/3). - ONE BENIGN PARATHYROID. Microscopic Comment Specimen: Total thyroid with central compartment lymph nodes. Procedure (including lymph node sampling if applicable): Total thyroidectomy with central lymph node biopsy. Specimen Integrity (intact/fragmented): Intact. Tumor focality: Unifocal. Dominant tumor: Maximum tumor size (cm): 1.4 cm. Tumor laterality: Left. Histologic type (including subtype and/or unique features as applicable): Papillary thyroid carcinoma, classic type. Tumor capsule: Present. Extrathyroidal extension: Absent. Capsular invasion with degree of invasion if present: N/A. Margins: Negative. Lymphatic or vascular invasion: Not identified. Lymph nodes: # examined 3; # positive; 0 Extracapsular extension (if applicable): N/A. TNM code: pT1b, pN0 Non-neoplastic thyroid: Largely unremarkable. Comments: There tumor is largely fibrotic and calcified with only a small portion of viable tumor. Vicente Males MD Pathologist, Electronic Signature (Case signed 08/30/2017)  Postsurgical hypothyroidism:  Pt is on levothyroxine 112 mcg daily (dose increased 10/01/2017), taken: - in am (some doses were around noontime at last visit, now only takes it in a.m.) - fasting - Coffee + milk 1 hour later - at least 1 hour from b'fast - no Ca, Fe, PPIs - + H2 blocker twice a day (first dose was moved at last visit to >4 hours after LT4) - + MVI at night - not on Biotin  Most recent TFTs normal: Lab Results  Component Value Date   TSH 2.01 11/12/2017   TSH 3.67 10/01/2017   FREET4 0.70 11/12/2017   FREET4 0.77 10/01/2017    Patient denies: - Weight gain - Fatigue - Cold intolerance - Constipation - Hair loss  Pt denies: - feeling nodules in neck - hoarseness - dysphagia - choking - SOB with lying down  He has ? FH of thyroid disorders or thyroid cancer as he was adopted. No h/o radiation tx to head or neck. No seaweed or kelp. No recent contrast studies. No herbal supplements. No Biotin use. No recent steroids use.   ROS: Constitutional: no weight gain/no weight loss, no fatigue, no subjective hyperthermia, no subjective hypothermia Eyes: no blurry vision, no xerophthalmia ENT: no sore throat, + see HPI Cardiovascular: no CP/no SOB/no palpitations/no leg swelling Respiratory: no cough/no SOB/no wheezing Gastrointestinal: no N/no V/no D/no C/no acid reflux Musculoskeletal: no muscle aches/no joint aches Skin: no rashes, no hair loss Neurological: no tremors/no numbness/no tingling/no dizziness  I reviewed pt's medications, allergies, PMH, social hx, family hx, and changes were documented in the history of present illness. Otherwise, unchanged from my initial visit note.  Past Medical History:  Diagnosis Date  . Anxiety   . COPD (chronic obstructive pulmonary disease) ([place])   . Depression   . Dysrhythmia  hx of PAC's  . Gout   . Hyperlipidemia   . Hypertension   .  Thyroid cancer ([place])    2018   Past Surgical History:  Procedure Laterality Date  . ABDOMINAL SURGERY     hemmoraging ulcer  . CATARACT EXTRACTION Right   . FRACTURE SURGERY     Left arm  . THYROIDECTOMY N/A 08/27/2017   Procedure: TOTAL THYROIDECTOMY WITH LIMITED LYMPH NODE DISSECTION;  Surgeon: Armandina Gemma, MD;  Location: [facility];  Service: General;  Laterality: N/A;   Social History   Social History  . Marital status: Single    Spouse name: N/A  . Number of children: 0   Occupational History  . n/a   Social History Main Topics  . Smoking status: Current Every Day Smoker    Packs/day: 1.00    Years: 40.00    Types: Cigarettes  . Smokeless tobacco: Never Used     Comment: 1 1/2 per day  . Alcohol use 0.6 oz/week    3-4 Cans of beer per day  . Drug use: No   Current Outpatient Medications on File Prior to Visit  Medication Sig Dispense Refill  . ALPRAZolam (XANAX) 1 MG tablet Take 1 mg by mouth 3 (three) times daily as needed for anxiety.     . ARIPiprazole (ABILIFY) 5 MG tablet Take 5 mg by mouth at bedtime.     Marland Kitchen atenolol (TENORMIN) 100 MG tablet Take 100 mg by mouth 2 (two) times daily.    . Cholecalciferol (VITAMIN D) 2000 units tablet Take 2,000 Units by mouth daily.    . famotidine (PEPCID) 20 MG tablet Take 20 mg by mouth 2 (two) times daily.    . fluticasone (FLONASE) 50 MCG/ACT nasal spray Place 2 sprays into both nostrils daily as needed for allergies or rhinitis.    Marland Kitchen gemfibrozil (LOPID) 600 MG tablet Take 600 mg by mouth 2 (two) times daily before a meal.    . levothyroxine (SYNTHROID, LEVOTHROID) 112 MCG tablet TAKE 1 TABLET(112 MCG) BY MOUTH DAILY 45 tablet 0  . Multiple Vitamin (MULTIVITAMIN WITH MINERALS) TABS tablet Take 1 tablet by mouth daily.    . Tiotropium Bromide Monohydrate (SPIRIVA RESPIMAT) 2.5 MCG/ACT AERS Inhale 2 puffs into the lungs daily. 3 Inhaler 3   No current facility-administered medications on file prior to visit.    No Known  Allergies Family History  Adopted: Yes    PE: There were no vitals taken for this visit. Wt Readings from Last 3 Encounters:  10/01/17 235 lb 12.8 oz (107 kg)  09/23/17 232 lb 6.4 oz (105.4 kg)  08/27/17 233 lb (105.7 kg)   Constitutional: overweight, in NAD Eyes: PERRLA, EOMI, no exophthalmos ENT: moist mucous membranes, no neck masses, thyroidectomy scar healing, no dysesthesia, no cervical lymphadenopathy Cardiovascular: RRR, No MRG Respiratory: CTA B Gastrointestinal: abdomen soft, NT, ND, BS+ Musculoskeletal: no deformities, strength intact in all 4 Skin: moist, warm, no rashes Neurological: no tremor with outstretched hands, DTR normal in all 4  ASSESSMENT: 1. Thyroid cancer - see HPI  2. Postsurgical Hypothyroidism  PLAN:  1. Papillary Thyroid Cancer - At last visit, we reviewed together the pathology and I explained that his cancer was low risk as it was classical PTC, not very large, encapsulated, without extrathyroidal extension, without lymphovascular invasion and with negative lymph node biopsies.  In this case, we decided to follow him without radioactive iodine treatment.  I explained that the main role of RAI treatment is  to facilitate monitoring in the long run (by checking thyroglobulin as a tumor marker).  However, we can follow this even without RAI treatment and monitor trend.  Even if his cancer recurs in the future, and will proceed with salvage therapy, prognosis is not changed but the most recent studies.  I again reassured him that papillary thyroid cancer is a slow-growing cancer with good prognosis, and this will unlikely affect his life expectancy and quality of life. - At this visit, we will check a thyroglobulin and ATA  - we will plan to check a thyroid ultrasound in 1 year from his surgery  2. Patient with history of total thyroidectomy for thyroid cancer, now with iatrogenic hypothyroidism, on generic levothyroxine therapy.  At last visit, he was  complaining of weight gain and fatigue.  We adjusted his levothyroxine dose from 100 to 112 mcg daily.  I also advised him to move the H2 blockers later in the day and to always take the levothyroxine in a.m. - latest thyroid labs reviewed with pt >> normal 10/2017 - he continues on LT4 112 mcg daily - pt feels good on this dose. - we discussed about taking the thyroid hormone every day, with water, >30 minutes before breakfast, separated by >4 hours from acid reflux medications, calcium, iron, multivitamins. Pt. is now taking it correctly. - will check thyroid tests today: TSH and fT4 - If labs are abnormal, he will need to return for repeat TFTs in 1.5 months  Patient Instructions  Please continue Synthroid 112 mcg daily.  Take the thyroid hormone every day, with water, at least 30 minutes before breakfast, separated by at least 4 hours from: - acid reflux medications - calcium - iron - multivitamins  Please stop at the lab.  Please come back for a follow-up appointment in 8 months.  Philemon Kingdom, MD PhD [facility]

## 2018-01-04 ENCOUNTER — Ambulatory Visit (INDEPENDENT_AMBULATORY_CARE_PROVIDER_SITE_OTHER)
Admission: RE | Admit: 2018-01-04 | Discharge: 2018-01-04 | Disposition: A | Discharge status: Home / Self Care | Payer: Medicare HMO | Source: Ambulatory Visit | Attending: Emergency Medicine | Admitting: Emergency Medicine

## 2018-01-04 DIAGNOSIS — R911 Solitary pulmonary nodule: Secondary | ICD-10-CM | POA: Diagnosis not present

## 2018-01-04 IMAGING — CT CT CHEST SUPER D W/O CM
2 of 4 series · 15 of 36 positions shown, 18 images · non-contrast
Comparison: Chest CT [DATE] and PET-CT [DATE]

CLINICAL DATA: Super D chest for preoperative planning.

EXAM:
CT CHEST WITHOUT CONTRAST
TECHNIQUE: Multidetector CT imaging of the chest was performed using thin slice
collimation for electromagnetic bronchoscopy planning purposes,
without intravenous contrast.

[Series 4: thins · axial · 0.87mm/px · z∈[-369,-38]mm · 12 of 464 slices shown, 15 images]
[im 25/464  mediastinal]
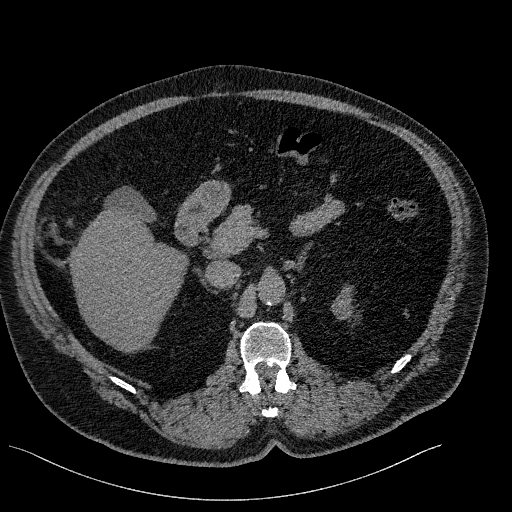
[im 25/464  lung]
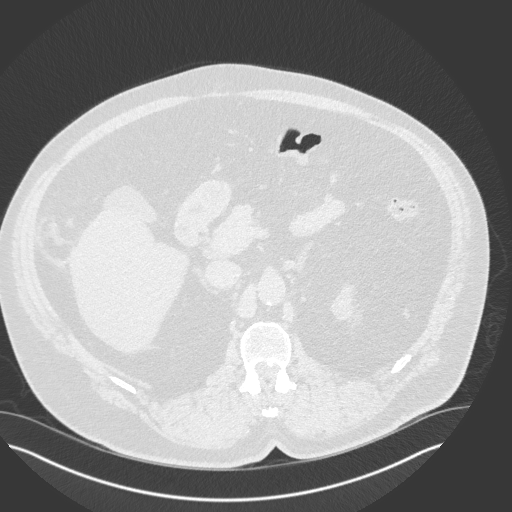
[im 74/464  lung]
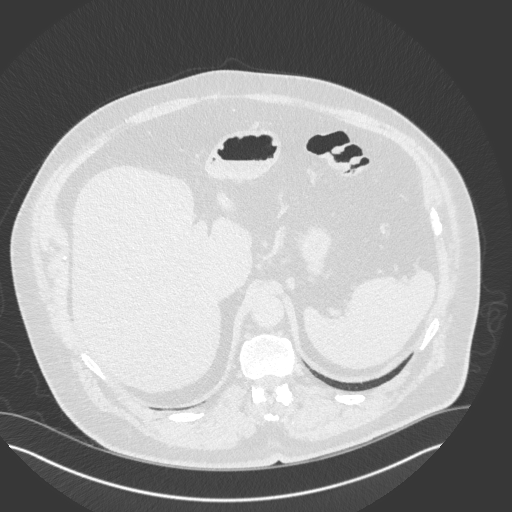
[im 98/464  lung]
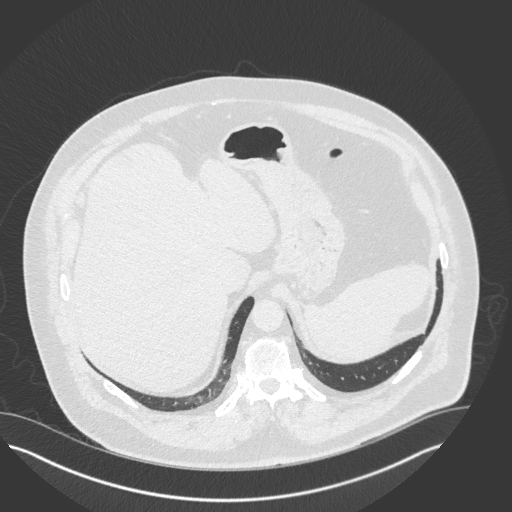
[im 147/464  lung]
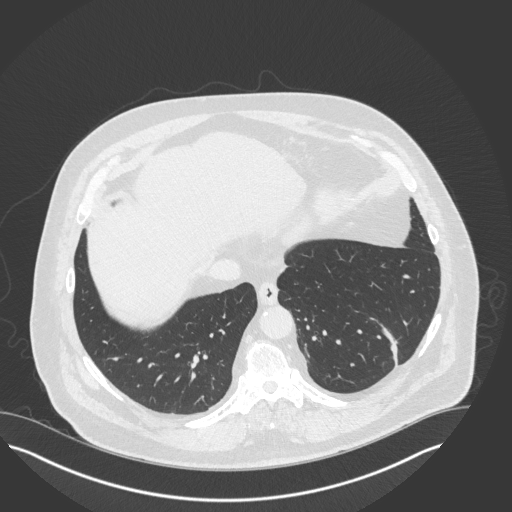
[im 171/464  mediastinal]
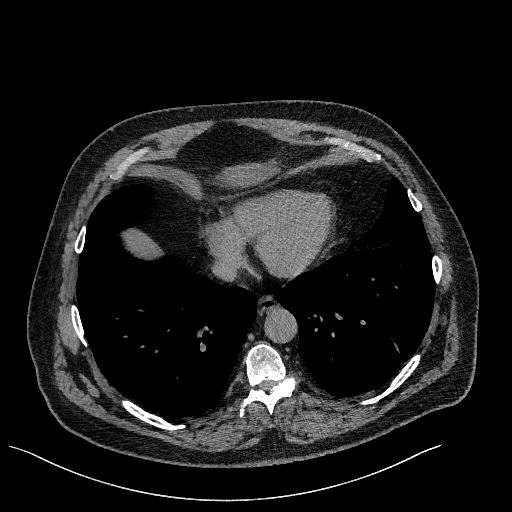
[im 171/464  lung]
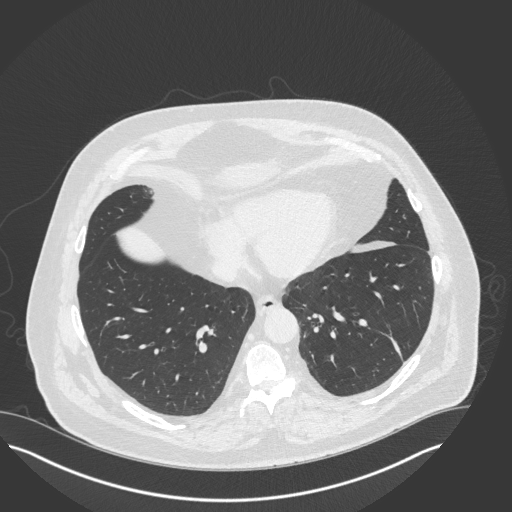
[im 220/464  lung]
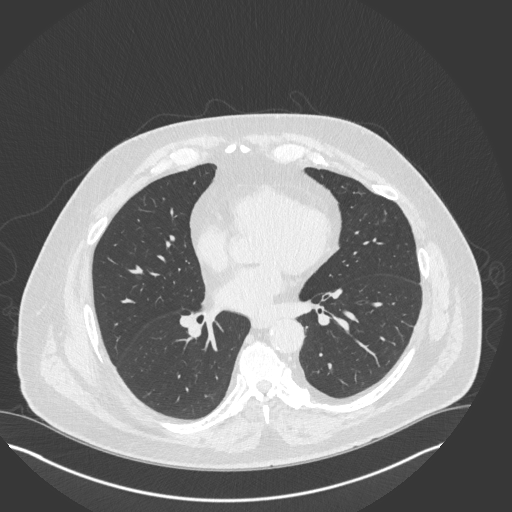
[im 244/464  lung]
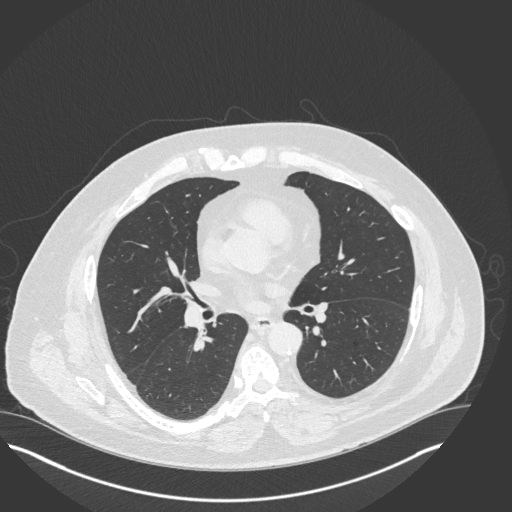
[im 293/464  lung]
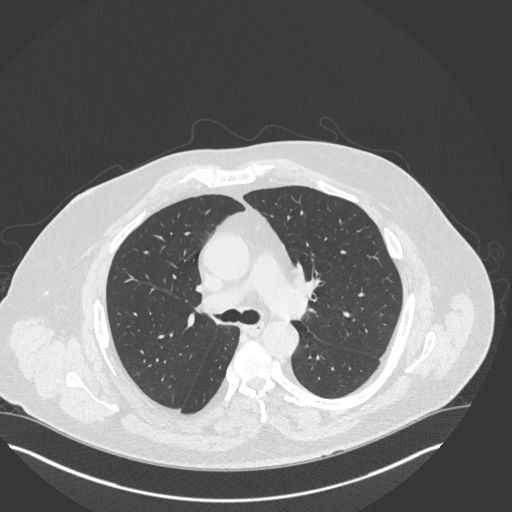
[im 317/464  mediastinal]
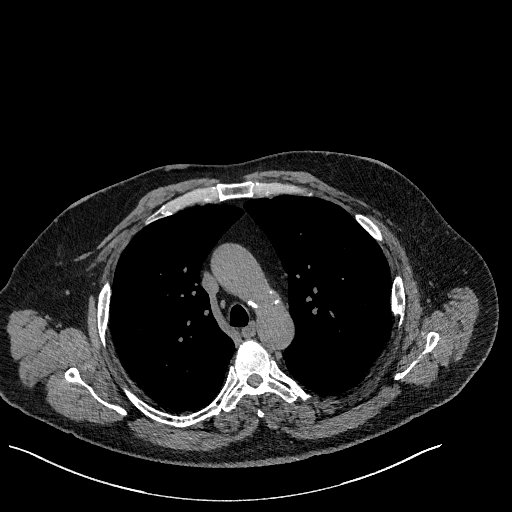
[im 317/464  lung]
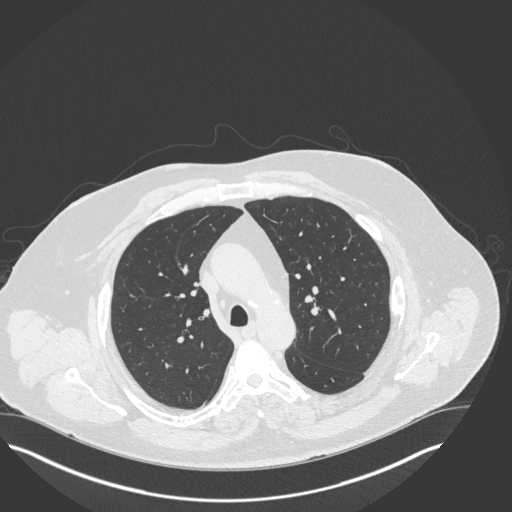
[im 366/464  lung]
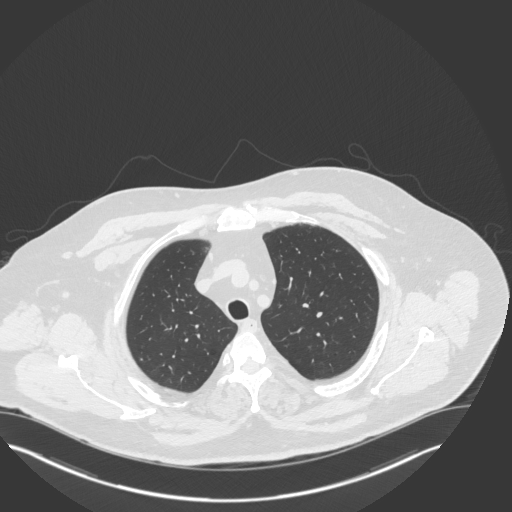
[im 390/464  lung]
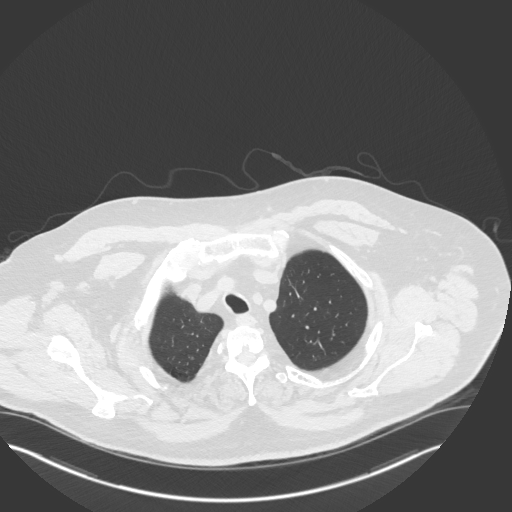
[im 439/464  lung]
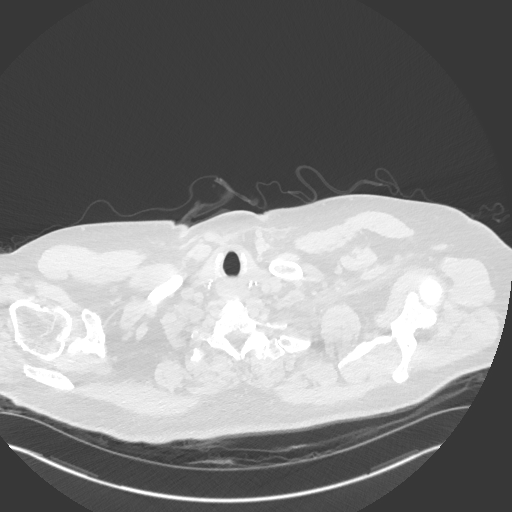

[Series 5: coronal · coronal · 0.72mm/px · 3 of 103 slices shown]
[im 21/103  lung]
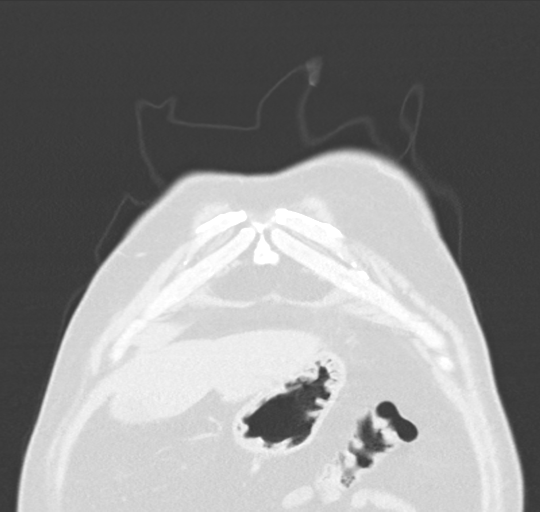
[im 41/103  lung]
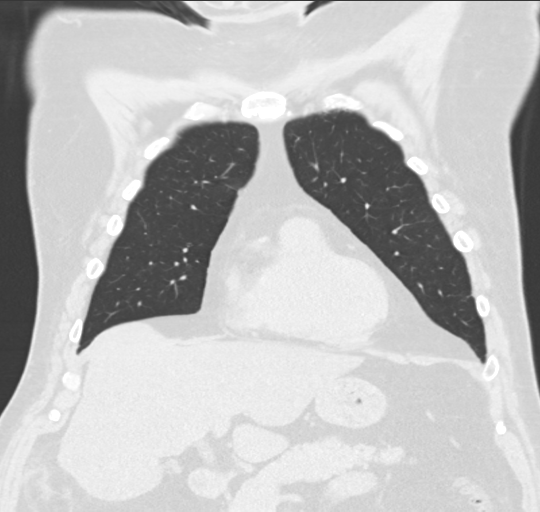
[im 62/103  lung]
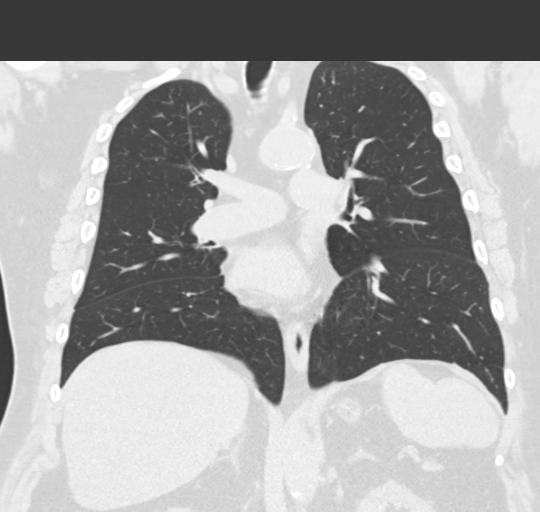

[15 of 36 positions shown; findings below may reference images not displayed]

FINDINGS: Cardiovascular: The heart is normal in size. No pericardial
effusion. Stable mild tortuosity and scattered atherosclerotic
calcifications involving the thoracic aorta. Small ductus
diverticulum noted. No obvious coronary artery calcifications.

Mediastinum/Nodes: Mediastinum no enlarged mediastinal or hilar
lymph nodes. Small scattered lymph nodes are stable. The esophagus
is grossly normal.

Lungs/Pleura: 9.5 mm ground-glass nodule in the left upper lobe. No
new pulmonary lesions or acute pulmonary findings. Mild
emphysematous changes. No pleural effusion. Mild basilar scarring
changes and atelectasis.

Upper Abdomen: Stable hepatic cysts. No worrisome hepatic lesions.
No adrenal gland lesions. There is a contracting area of omental
disease along the inferior anterior margin of the liver. This is
most likely a focus of prior omental infarction or epiploic
appendagitis. Interval decrease and surrounding inflammation.

Musculoskeletal: No significant findings.
IMPRESSION: 1. 9.5 mm ground-glass nodule in the left upper lobe.
2. No other worrisome lung lesions.
3. No mediastinal or hilar mass or adenopathy.
4. Stable hepatic cysts.
5. Contracting area of probable prior omental infarct or epiploic
appendagitis.

Aortic Atherosclerosis ([V6]-[V6]) and Emphysema ([V6]-[V6]).

## 2018-01-13 DIAGNOSIS — H2513 Age-related nuclear cataract, bilateral: Secondary | ICD-10-CM | POA: Diagnosis not present

## 2018-01-24 ENCOUNTER — Ambulatory Visit: Payer: Medicare HMO | Admitting: Emergency Medicine

## 2018-01-25 ENCOUNTER — Other Ambulatory Visit: Payer: Self-pay | Admitting: Internal Medicine

## 2018-02-01 ENCOUNTER — Other Ambulatory Visit: Payer: Self-pay | Admitting: Internal Medicine

## 2018-02-04 ENCOUNTER — Ambulatory Visit: Payer: Medicare HMO | Admitting: Emergency Medicine

## 2018-02-04 ENCOUNTER — Encounter: Payer: Self-pay | Admitting: Emergency Medicine

## 2018-02-04 ENCOUNTER — Telehealth: Payer: Self-pay | Admitting: Internal Medicine

## 2018-02-04 DIAGNOSIS — J449 Chronic obstructive pulmonary disease, unspecified: Secondary | ICD-10-CM | POA: Diagnosis not present

## 2018-02-04 DIAGNOSIS — R911 Solitary pulmonary nodule: Secondary | ICD-10-CM | POA: Diagnosis not present

## 2018-02-04 NOTE — Patient Instructions (Addendum)
You need to work hard on decreasing your cigarettes. Our ultimate goal needs to be to stop altogether.  Your left lung nodule has enlarged slightly. It is behaving like a slow-growing lung cancer.  We discussed the options of bronchoscopy and also primary lung surgery as next steps.  As we consider this I believe will be beneficial for you to talk to thoracic surgery to about the risks, benefits of a possible operation.  Once she had that conversation and we will decide whether to pursue bronchoscopy, pursue surgery or to follow your CT longer for interval change. Follow with Dr Lamonte Sakai in 1 month

## 2018-02-04 NOTE — Progress Notes (Signed)
Subjective:    Patient ID: Timothy Conway, male    DOB: Apr 01, 1956, 62 y.o.   MRN: [id number]  HPI 62 year old active smoker (40 pack years) with a history of hypertension, hyperlipidemia and presumed COPD. He is also followed by Dr. Trula Slade for an arch penetrating ulcer that has been noted on CT scan of the chest. Also on his serial CTs we've been following a slowly enlarging left upper lobe groundglass nodule. On his most recent CT scan from 06/28/17 that I have personally reviewed the groundglass nodule has increased in size to 0.9 x 0.6 cm, up from 0.5 cm. There is no solid component or semisolid component seen. He is referred for evaluation of the nodule. As mentioned he has been given a diagnosis of COPD, had pulmonary function testing 11/07/15 that I reviewed. This showed mixed obstruction and restriction with moderately severely reduced FEV1, grossly normal diffusion capacity.  He is on Advair 250/50, albuterol as needed.  ROV 09/23/17 --this is a follow-up visit for patient with a history of tobacco, COPD, and a lowly enlarging left upper lobe groundglass nodule concerning for possible adenocarcinoma.  PET scan was performed on 07/13/17.  This showed no significant hypermetabolism in the nodule but there was hypermetabolism of a 1.1 cm left thyroid nodule, 2.0 cm soft tissue density in the cecum. He has since had a thyroidectomy for papillary CA. He is planning for a colonoscopy to evaluate the cecal lesion by GI. His breathing is stable. He is planning to quit smoking soon. He has tried a sample Spiriva, stopped Advair when he ran out. He liked the Spiriva, but can't afford until January. He borrowed some Dulera, hasn't started it yet.            ROV 02/04/18 --this is a follow-up visit for 62 year old patient with COPD, history of tobacco, enlarging groundglass left upper lobe nodule.  Was PET negative 07/13/17.  As above he has been treated for papillary thyroid cancer.  He underwent CSY and  had some pre-cancerous lesions removed, will be on surveillance. We have been managing his COPD with Spiriva Respimat.  He underwent a repeat CT scan of the chest on 01/04/18 that I have reviewed.  The left upper lobe groundglass nodule is still present, it is approximately 9.5 mm in largest dimension, slightly enlarged compared with July 2018. He is still smoking a pack a day.                                                                        Review of Systems  Constitutional: Negative for fever and unexpected weight change.  HENT: Negative for congestion, dental problem, ear pain, nosebleeds, postnasal drip, rhinorrhea, sinus pressure, sneezing, sore throat and trouble swallowing.   Eyes: Negative for redness and itching.  Respiratory: Positive for cough and shortness of breath. Negative for chest tightness and wheezing.   Cardiovascular: Negative for palpitations and leg swelling.  Gastrointestinal: Negative for nausea and vomiting.  Genitourinary: Negative for dysuria.  Musculoskeletal: Negative for joint swelling.  Skin: Negative for rash.  Neurological: Negative for headaches.  Hematological: Does not bruise/bleed easily.  Psychiatric/Behavioral: Negative for dysphoric mood. The patient is not nervous/anxious.    Past Medical History:  Diagnosis  Date  . Anxiety   . COPD (chronic obstructive pulmonary disease) ([place])   . Depression   . Dysrhythmia    hx of PAC's  . Gout   . Hyperlipidemia   . Hypertension   . Thyroid cancer ([place])    2018     Family History  Adopted: Yes     Social History   Socioeconomic History  . Marital status: Single    Spouse name: Not on file  . Number of children: Not on file  . Years of education: Not on file  . Highest education level: Not on file  Social Needs  . Financial resource strain: Not on file  . Food insecurity - worry: Not on file  . Food insecurity - inability: Not on file  . Transportation needs - medical: Not on file  .  Transportation needs - non-medical: Not on file  Occupational History  . Not on file  Tobacco Use  . Smoking status: Current Every Day Smoker    Packs/day: 1.00    Years: 40.00    Pack years: 40.00    Types: Cigarettes  . Smokeless tobacco: Never Used  Substance and Sexual Activity  . Alcohol use: Yes    Alcohol/week: 0.6 oz    Types: 1 Cans of beer per week    Comment: moderate  . Drug use: No  . Sexual activity: Not Currently  Other Topics Concern  . Not on file  Social History Narrative  . 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  No TB exposure or positive skin testing.   No Known Allergies   Outpatient Medications Prior to Visit  Medication Sig Dispense Refill  . ALPRAZolam (XANAX) 1 MG tablet Take 1 mg by mouth 3 (three) times daily as needed for anxiety.     . ARIPiprazole (ABILIFY) 5 MG tablet Take 5 mg by mouth at bedtime.     Marland Kitchen atenolol (TENORMIN) 100 MG tablet Take 100 mg by mouth 2 (two) times daily.    . Cholecalciferol (VITAMIN D) 2000 units tablet Take 2,000 Units by mouth daily.    . famotidine (PEPCID) 20 MG tablet Take 20 mg by mouth 2 (two) times daily.    . fluticasone (FLONASE) 50 MCG/ACT nasal spray Place 2 sprays into both nostrils daily as needed for allergies or rhinitis.    Marland Kitchen gemfibrozil (LOPID) 600 MG tablet Take 600 mg by mouth 2 (two) times daily before a meal.    . levothyroxine (SYNTHROID, LEVOTHROID) 112 MCG tablet TAKE 1 TABLET(112 MCG) BY MOUTH DAILY 45 tablet 0  . Multiple Vitamin (MULTIVITAMIN WITH MINERALS) TABS tablet Take 1 tablet by mouth daily.    . Tiotropium Bromide Monohydrate (SPIRIVA RESPIMAT) 2.5 MCG/ACT AERS Inhale 2 puffs into the lungs daily. 3 Inhaler 3   No facility-administered medications prior to visit.         Objective:   Physical Exam Vitals:   02/04/18 0937  BP: (!) 148/80  Pulse:  76  SpO2: 96%  Weight: 260 lb (117.9 kg)  Height: 6\' 1"  (1.854 m)   Gen: Pleasant, well-nourished, in no distress  ENT: No lesions,  mouth clear,  oropharynx clear, no postnasal drip  Neck: No JVD, no TMG, no carotid bruits  Lungs: No use of accessory muscles, clear, soft wheeze on a forced expiration  Cardiovascular: RRR, heart sounds  normal, no murmur or gallops, no peripheral edema  Musculoskeletal: No deformities, no cyanosis or clubbing  Neuro: alert, non focal  Skin: Warm, no lesions or rashes   30 minutes spent discussing options, counseling regarding left upper lobe nodule and options for therapy     Assessment & Plan:  Pulmonary nodule, left You need to work hard on decreasing your cigarettes. Our ultimate goal needs to be to stop altogether.  Your left lung nodule has enlarged slightly. It is behaving like a slow-growing lung cancer.  We discussed the options of bronchoscopy and also primary lung surgery as next steps.  As we consider this I believe will be beneficial for you to talk to thoracic surgery to about the risks, benefits of a possible operation.  Once she had that conversation and we will decide whether to pursue bronchoscopy, pursue surgery or to follow your CT longer for interval change. Follow with Dr Lamonte Sakai in 1 month  COPD (chronic obstructive pulmonary disease) ([place]) Continue Spiriva Respimat   Baltazar Apo, MD, PhD 02/04/2018, 10:24 AM [facility] [phone] or if no answer [phone]

## 2018-02-04 NOTE — Assessment & Plan Note (Signed)
Continue Spiriva Respimat

## 2018-02-04 NOTE — Assessment & Plan Note (Signed)
You need to work hard on decreasing your cigarettes. Our ultimate goal needs to be to stop altogether.  Your left lung nodule has enlarged slightly. It is behaving like a slow-growing lung cancer.  We discussed the options of bronchoscopy and also primary lung surgery as next steps.  As we consider this I believe will be beneficial for you to talk to thoracic surgery to about the risks, benefits of a possible operation.  Once she had that conversation and we will decide whether to pursue bronchoscopy, pursue surgery or to follow your CT longer for interval change. Follow with Dr Lamonte Sakai in 1 month

## 2018-02-04 NOTE — Telephone Encounter (Signed)
Pt had thyroid removed in September and since then he has gained 30 lbs should he increase this thyroid med

## 2018-02-07 ENCOUNTER — Telehealth: Payer: Self-pay | Admitting: Emergency Medicine

## 2018-02-07 DIAGNOSIS — R911 Solitary pulmonary nodule: Secondary | ICD-10-CM

## 2018-02-07 NOTE — Telephone Encounter (Addendum)
Patient needs an OV follow-up w/ Dr. Cruzita Lederer.

## 2018-02-07 NOTE — Telephone Encounter (Signed)
Please refer him to see [facility] Dr Roxan Hockey to discuss primary resection of his pulmonary nodule.

## 2018-02-07 NOTE — Telephone Encounter (Signed)
Sorry that was supposed to be RB

## 2018-02-07 NOTE — Telephone Encounter (Signed)
Called and spoke with patient, he states that when he was last seen by RB he suggested that the patient have surgery. Patient stated that he wanted to take a month to think on it and he has now decided that he wants to have the surgery. He is requesting that he has it done before his next appointment with RB in April. He is asking that we set this up.  RB please advise on this, thanks.

## 2018-02-07 NOTE — Telephone Encounter (Signed)
He has an appointment in 1 month, but for now, let's have him back for labs first: can you please order a TSH and a free T4? We will adjust the dose of his LT4 as needed. Please make sure he is taking it: every day, with water, at least 30 minutes before breakfast, separated by at least 4 hours from: - acid reflux medications - calcium - iron - multivitamins

## 2018-02-07 NOTE — Telephone Encounter (Signed)
Referral placed. Pt called X 3 but there was busy signal. Will try again later.

## 2018-02-08 ENCOUNTER — Telehealth: Payer: Self-pay

## 2018-02-08 DIAGNOSIS — E890 Postprocedural hypothyroidism: Secondary | ICD-10-CM

## 2018-02-08 NOTE — Telephone Encounter (Signed)
Spoke to patient. Gave instructions. Ordered labs and scheduled lab appt. Patient confirmed he is taking his LT4 med as directed.

## 2018-02-08 NOTE — Telephone Encounter (Signed)
Called pt no answer.  LMTCB

## 2018-02-08 NOTE — Telephone Encounter (Signed)
Patient called back. Please advise. CB [phone] Kris Mouton, RMA

## 2018-02-09 NOTE — Telephone Encounter (Signed)
Spoke with pt. He is aware that this referral has been taken care of. Nothing further was needed.

## 2018-02-09 NOTE — Telephone Encounter (Signed)
Noted on another telephone message taken. Thanks

## 2018-02-11 ENCOUNTER — Other Ambulatory Visit (INDEPENDENT_AMBULATORY_CARE_PROVIDER_SITE_OTHER): Payer: Medicare HMO

## 2018-02-11 DIAGNOSIS — E890 Postprocedural hypothyroidism: Secondary | ICD-10-CM | POA: Diagnosis not present

## 2018-02-11 LAB — T4, FREE: FREE T4: 0.62 ng/dL (ref 0.60–1.60)

## 2018-02-11 LAB — TSH: TSH: 12.75 u[IU]/mL — AB (ref 0.35–4.50)

## 2018-02-14 ENCOUNTER — Telehealth: Payer: Self-pay | Admitting: Internal Medicine

## 2018-02-14 DIAGNOSIS — E890 Postprocedural hypothyroidism: Secondary | ICD-10-CM

## 2018-02-14 NOTE — Telephone Encounter (Signed)
Patient is taking his medication exactly as prescribed. Please help. His thyroid level is rock bottom. Please call him at ph# [phone]

## 2018-02-15 ENCOUNTER — Telehealth: Payer: Self-pay | Admitting: Internal Medicine

## 2018-02-15 NOTE — Telephone Encounter (Signed)
I wrote him a message on 03/15 and he did not respond:  Viewed by Leander Rams on 02/14/2018 12:42 PM  Written by Philemon Kingdom, MD on 02/11/2018 5:41 PM  Dear Timothy Conway,  The thyroid tests are worse >> did you miss any thyroid medication doses?  Or did you change anything in how you are taking the medication?  Philemon Kingdom MD   He did not change how he takes his medication or if he did not miss doses, we can go ahead and increase his levothyroxine dose to 125 mcg daily.  Can you please take off the previous dose from his list and put this in? He needs labs in 1.5 mo: TSH and free t4

## 2018-02-15 NOTE — Telephone Encounter (Signed)
Called pt. No answer °

## 2018-02-15 NOTE — Telephone Encounter (Signed)
Patient stated he is needing some help with  His medication.  He states that he had some lab work done that the results were not good and that he is having a difficult time.   Please advise   levothyroxine (SYNTHROID, LEVOTHROID) 112 MCG tablet

## 2018-02-16 ENCOUNTER — Telehealth: Payer: Self-pay | Admitting: Internal Medicine

## 2018-02-16 MED ORDER — LEVOTHYROXINE SODIUM 125 MCG PO TABS: 125.0000 ug | ORAL_TABLET | Freq: Every day | ORAL | 3 refills | Status: DC

## 2018-02-16 NOTE — Telephone Encounter (Signed)
Called and LVM with lab results and requested a call back to further discuss and let us know if he has been taking his thyroid medication properly

## 2018-02-16 NOTE — Addendum Note (Signed)
Addended by: Leia Alf on: 02/16/2018 03:43 PM   Modules accepted: Orders

## 2018-02-16 NOTE — Telephone Encounter (Signed)
Please advise 

## 2018-02-16 NOTE — Telephone Encounter (Signed)
Spoke to patient. Gave med instructions. Pt verbalized understanding. Sent Rx. Ordered labs. Pt will schedule 1.5 month lab appt when he comes in for OV on 03/09/18.

## 2018-02-16 NOTE — Telephone Encounter (Signed)
I already sent a message to [place] about this.

## 2018-02-16 NOTE — Telephone Encounter (Signed)
Patient states he is still waiting on a phone call from our office about what he should do about his thyroid.   Please advise

## 2018-02-23 ENCOUNTER — Encounter: Payer: Medicare HMO | Admitting: Thoracic Surgery (Cardiothoracic Vascular Surgery)

## 2018-02-28 ENCOUNTER — Encounter: Payer: Self-pay | Admitting: Thoracic Surgery (Cardiothoracic Vascular Surgery)

## 2018-02-28 ENCOUNTER — Institutional Professional Consult (permissible substitution): Payer: Medicare HMO | Admitting: Thoracic Surgery (Cardiothoracic Vascular Surgery)

## 2018-02-28 VITALS — BP 144/78 | HR 62 | Resp 20 | Ht 73.0 in | Wt 260.0 lb

## 2018-02-28 DIAGNOSIS — R911 Solitary pulmonary nodule: Secondary | ICD-10-CM

## 2018-02-28 NOTE — Progress Notes (Signed)
PCP is Lujean Amel, MD Referring Provider is Collene Gobble, MD  Chief Complaint  Patient presents with  . Lung Lesion    Surgical eval, Chest CT -super D 01/04/2018    HPI: [name] for consultation regarding a groundglass opacity in the left upper lobe.  [name] is a 62 year old man with a past medical history significant for papillary thyroid cancer status post thyroidectomy, tobacco abuse (60 pack years), COPD, hypertension, hyperlipidemia, penetrating ulcer of the aortic arch, depression, disabling anxiety, and PACs.  He continues to smoke about a pack a day.  He is not sure exactly why he had his first CT scan back in 2016.  He was found to have a small penetrating ulcer of the aortic arch and also a 5 mm left upper lobe nodule.  Dr. Trula Slade follows him for the treating ulcer.  He was seen in consultation by Dr. Lamonte Sakai.  He has had continued follow-up since then.  The nodule has slowly increased in size.  He had a PET/CT back in August 2018.  The left upper lobe nodule was not hypermetabolic.  The scan did show a hypermetabolic thyroid nodule that turned out to be a papillary carcinoma.  It also showed some abnormality in the area of the cecum.  He underwent a thyroidectomy for the papillary cancer.  He says he had a colonoscopy in [facility] for the cecal problem.  He is not sure what exactly they found that he did not require surgery.  He recently saw Dr. Lamonte Sakai with a follow-up CT.  It showed an increase in size of the groundglass opacity.  There were no other suspicious findings.  He tends to smoke about 1 pack a day.  He has gained over 30 pounds since he had his thyroidectomy 6 months ago.  He feels tired and run down.  He has a lack of energy.  He is not having any chest pain, pressure, or tightness.  He does have wheezing and a productive cough.  He denies any headaches or visual changes.  Zubrod Score: At the time of surgery this patient's most appropriate activity  status/level should be described as: []     0    Normal activity, no symptoms [x]     1    Restricted in physical strenuous activity but ambulatory, able to do out light work []     2    Ambulatory and capable of self care, unable to do work activities, up and about >50 % of waking hours                              []     3    Only limited self care, in bed greater than 50% of waking hours []     4    Completely disabled, no self care, confined to bed or chair []     5    Moribund  Past Medical History:  Diagnosis Date  . Anxiety   . COPD (chronic obstructive pulmonary disease) ([place])   . Depression   . Dysrhythmia    hx of PAC's  . Gout   . Hyperlipidemia   . Hypertension   . Thyroid cancer ([place])    2018    Past Surgical History:  Procedure Laterality Date  . ABDOMINAL SURGERY     hemmoraging ulcer  . CATARACT EXTRACTION Right   . FRACTURE SURGERY     Left arm  . THYROIDECTOMY  N/A 08/27/2017   Procedure: TOTAL THYROIDECTOMY WITH LIMITED LYMPH NODE DISSECTION;  Surgeon: Armandina Gemma, MD;  Location: [facility];  Service: General;  Laterality: N/A;    Family History  Adopted: Yes    Social History Social History   Tobacco Use  . Smoking status: Current Every Day Smoker    Packs/day: 1.00    Years: 40.00    Pack years: 40.00    Types: Cigarettes  . Smokeless tobacco: Never Used  Substance Use Topics  . Alcohol use: Yes    Alcohol/week: 0.6 oz    Types: 1 Cans of beer per week    Comment: moderate  . Drug use: No    Current Outpatient Medications  Medication Sig Dispense Refill  . ALPRAZolam (XANAX) 1 MG tablet Take 1 mg by mouth 3 (three) times daily as needed for anxiety.     . ARIPiprazole (ABILIFY) 5 MG tablet Take 5 mg by mouth at bedtime.     Marland Kitchen atenolol (TENORMIN) 100 MG tablet Take 100 mg by mouth 2 (two) times daily.    . Cholecalciferol (VITAMIN D) 2000 units tablet Take 2,000 Units by mouth daily.    . famotidine (PEPCID) 20 MG tablet Take 20 mg by mouth 2  (two) times daily.    . fluticasone (FLONASE) 50 MCG/ACT nasal spray Place 2 sprays into both nostrils daily as needed for allergies or rhinitis.    Marland Kitchen gemfibrozil (LOPID) 600 MG tablet Take 600 mg by mouth 2 (two) times daily before a meal.    . levothyroxine (SYNTHROID, LEVOTHROID) 125 MCG tablet Take 1 tablet (125 mcg total) by mouth daily. 90 tablet 3  . Multiple Vitamin (MULTIVITAMIN WITH MINERALS) TABS tablet Take 1 tablet by mouth daily.    . Tiotropium Bromide Monohydrate (SPIRIVA RESPIMAT) 2.5 MCG/ACT AERS Inhale 2 puffs into the lungs daily. 3 Inhaler 3   No current facility-administered medications for this visit.     No Known Allergies  Review of Systems  Constitutional: Positive for activity change, fatigue and unexpected weight change (Has gained 30 pounds in 6 months since his thyroid surgery). Negative for appetite change.  HENT: Positive for dental problem and hearing loss.   Respiratory: Positive for cough, shortness of breath and wheezing.   Cardiovascular: Negative for chest pain and leg swelling.  Gastrointestinal: Positive for diarrhea.  Endocrine:       Hypothyroid  Musculoskeletal: Positive for arthralgias and joint swelling.  Hematological: Negative for adenopathy. Does not bruise/bleed easily.  Psychiatric/Behavioral: Positive for dysphoric mood. The patient is nervous/anxious.   All other systems reviewed and are negative.   BP (!) 144/78   Pulse 62   Resp 20   Ht 6\' 1"  (1.854 m)   Wt 260 lb (117.9 kg)   SpO2 97% Comment: RA  BMI 34.30 kg/m  Physical Exam  Constitutional: No distress.  Obese  HENT:  Head: Normocephalic and atraumatic.  Mouth/Throat: No oropharyngeal exudate.  Eyes: Conjunctivae and EOM are normal. No scleral icterus.  Neck: Neck supple. No thyromegaly present.  Well-healed scar  Cardiovascular: Normal rate, regular rhythm and normal heart sounds. Exam reveals no gallop and no friction rub.  No murmur heard. Pulmonary/Chest:  Effort normal and breath sounds normal. No respiratory distress. He has no wheezes. He has no rales.  Abdominal: Soft. He exhibits no distension. There is no tenderness.  Musculoskeletal: He exhibits no edema.  Lymphadenopathy:    He has no cervical adenopathy.  Neurological: He is alert. No cranial  nerve deficit. He exhibits normal muscle tone.  Skin: Rash (Central facial rash) noted. He is not diaphoretic.  Psychiatric:  Flat affect  Vitals reviewed.  Diagnostic Tests: CT CHEST WITHOUT CONTRAST  TECHNIQUE: Multidetector CT imaging of the chest was performed using thin slice collimation for electromagnetic bronchoscopy planning purposes, without intravenous contrast.  COMPARISON:  Chest CT 06/28/2017 and PET-CT 07/13/2017  FINDINGS: Cardiovascular: The heart is normal in size. No pericardial effusion. Stable mild tortuosity and scattered atherosclerotic calcifications involving the thoracic aorta. Small ductus diverticulum noted. No obvious coronary artery calcifications.  Mediastinum/Nodes: Mediastinum no enlarged mediastinal or hilar lymph nodes. Small scattered lymph nodes are stable. The esophagus is grossly normal.  Lungs/Pleura: 9.5 mm ground-glass nodule in the left upper lobe. No new pulmonary lesions or acute pulmonary findings. Mild emphysematous changes. No pleural effusion. Mild basilar scarring changes and atelectasis.  Upper Abdomen: Stable hepatic cysts. No worrisome hepatic lesions. No adrenal gland lesions. There is a contracting area of omental disease along the inferior anterior margin of the liver. This is most likely a focus of prior omental infarction or epiploic appendagitis. Interval decrease and surrounding inflammation.  Musculoskeletal: No significant findings.  IMPRESSION: 1. 9.5 mm ground-glass nodule in the left upper lobe. 2. No other worrisome lung lesions. 3. No mediastinal or hilar mass or adenopathy. 4. Stable hepatic  cysts. 5. Contracting area of probable prior omental infarct or epiploic appendagitis.  Aortic Atherosclerosis (ICD10-I70.0) and Emphysema (ICD10-J43.9).   Electronically Signed   By: Marijo Sanes M.D.   On: 01/04/2018 14:40 NUCLEAR MEDICINE PET SKULL BASE TO THIGH  TECHNIQUE: 11.5 mCi F-18 FDG was injected intravenously. Full-ring PET imaging was performed from the skull base to thigh after the radiotracer. CT data was obtained and used for attenuation correction and anatomic localization.  FASTING BLOOD GLUCOSE:  Value: 91 mg/dl  COMPARISON:  06/28/2017  FINDINGS: NECK  A rim calcified 1.1 cm nodule laterally in the left thyroid lobe on image 46/4 has maximum standard uptake value of 11.2. No hypermetabolic adenopathy in the neck observed.  CHEST  Indistinctly marginated 9 by 6 mm left upper lobe ground-glass density nodule, maximum SUV in this area approximately 0.7. No hypermetabolic adenopathy in the chest. Some low-grade physiologic activity in the distal half of the esophagus. Scarring or atelectasis in the left lower lobe.  Atherosclerotic calcification of the aortic arch and descending thoracic aorta.  A paraesophageal 0.6 cm lymph node on image 97/4 has maximum SUV of 3.2, probably benign.  ABDOMEN/PELVIS  No abnormal hypermetabolic activity within the liver, pancreas, adrenal glands, or spleen.  2.0 cm soft tissue density structure in the cecum on image 156/4 associated with some local potential hypermetabolic activity with maximum SUV up to 15.4. This is not exactly correlate in location but the bowel may have shifted.  Just below the right hepatic lobe edge, there is abnormal 6.4 by 4.1 cm region of inflammatory stranding along omental adipose tissue with associated hypermetabolic activity and maximum SUV of 2 9.2. This was not observed on 03/20/2016 and I favor an omental infarct or inflammatory focus over some sort of  liposarcoma or malignancy given the morphology and appearance.  Accentuated activity in the sigmoid colon is probably physiologic.  1.1 cm hypodense lesion posteriorly in the right hepatic lobe on image 120/4 without associated abnormal metabolic activity.  Aortoiliac atherosclerotic vascular disease.  SKELETON  No focal hypermetabolic activity to suggest skeletal metastasis. Deformity of the right iliac crest. Old bilateral rib fractures, some on  the right with healing response.  IMPRESSION: 1. The 9 by 6 mm left upper lobe ground-glass density nodule does not have accentuated hypermetabolic activity. Please note that many low-grade malignancies with this morphology are not hypermetabolic on PET-CT, and given the clear increase in size of this process since 11/27/2015, low-grade malignancy is a distinct possibility. 2. Hypermetabolic activity associated with stranding around a lobulation of omental adipose tissue just below the right hepatic lobe margin. This probably represents inflammatory response to an omental infarct. It was not present on prior abdomen CT of 03/20/2016 and hence seems unlikely to be liposarcoma. Correlate with any tenderness or discomfort in this vicinity. 3. Rim calcified 1.1 cm left thyroid lobe nodule is hypermetabolic. About 40% of such hypermetabolic thyroid nodules represent malignancy. Thyroid ultrasound with low threshold for fine-needle aspiration recommended. 4. 2.0 cm soft tissue density in the cecum may correspond to a focus of hypermetabolic activity on the PET data with SUV up to 15.4. He cecal polyp is a distinct possibility. Consider colonoscopy. 5.  Aortic Atherosclerosis (ICD10-I70.0).   Electronically Signed   By: Van Clines M.D.   On: 07/13/2017 14:13 I personally reviewed the CT and PET/CT and concur with the findings noted above  Impression: [name] a 62 year old gentleman with a history of tobacco abuse who  has a slowly enlarging left upper lobe groundglass opacity.  This was not hypermetabolic by PET CT back in August, but that is not surprising for this type of lesion.  Differential diagnosis includes infection and inflammation, but by far the most likely diagnosis is a low-grade adenocarcinoma with lepidic spread.  I discussed potential treatment options for the nodule including surgical resection and stereotactic radiation.  We discussed the advantages and disadvantages of each of those approaches.  We discussed how the decision between surgery and radiation effects however go about making the diagnosis.  Given the significant likelihood of a false negative biopsy, if he wants to proceed with surgery I would go straight to surgery rather than do a bronchoscopic biopsy.  If he wished to consider radiation we could do a navigational bronchoscopy. He is more interested in pursuing surgery.  He had questions regarding the potential surgery.  I informed him of the general nature of the procedure.  In his case because the nodule is relatively deep in the lung and groundglass, it would be very difficult to feel intraoperatively.  Therefore I would plan to do a navigational bronchoscopy for fiducial placement and marking of the tumor prior to proceeding with left VATS and likely a lingular sparing left upper lobectomy.  It is possible he might be able to do this with a wedge resection but may be hard to get margins without doing a segmental resection.  I described the need for general anesthesia, the incisions to be used, the use of drains to postoperatively, the expected hospital stay, and the overall recovery.  I informed him of the indications, risks, benefits, and alternatives.  He understands the risks include, but not limited to death, MI, DVT, PE, bleeding, possible need for transfusion, infection, prolonged air leak, cardiac arrhythmias, as well as possibility of other unforeseeable complications.  Tobacco  abuse-currently smoking 1 pack of cigarettes daily.  Maximum was 2 packs/day. Overall has a 60-pack-year history.  Cecal abnormality on PET/CT-he says that he saw a gastroenterologist in [facility] and that has been evaluated.  Papillary thyroid cancer-status post thyroidectomy.  Hypothyroidism postoperative-most recent TSH was 12.  He feels very tired and  run down and has been gaining weight.  He says his been taking his Synthroid every day.  He has a follow-up appointment with Dr. Cruzita Lederer in 2 weeks.  He does not want to undergo surgery until he feels better.  He would like to wait in 9 weeks before coming back to see me to further discuss surgery  Plan: Return in 9 weeks to further discuss surgery.  As it will of been 3 months we will have to repeat his CT scan at that time.  Melrose Nakayama, MD [facility] [phone]

## 2018-03-01 ENCOUNTER — Ambulatory Visit: Payer: Medicare HMO | Admitting: Internal Medicine

## 2018-03-03 ENCOUNTER — Other Ambulatory Visit: Payer: Self-pay | Admitting: *Deleted

## 2018-03-03 DIAGNOSIS — R911 Solitary pulmonary nodule: Secondary | ICD-10-CM

## 2018-03-07 ENCOUNTER — Ambulatory Visit: Payer: Medicare HMO | Admitting: Emergency Medicine

## 2018-03-09 ENCOUNTER — Ambulatory Visit: Payer: Medicare HMO | Admitting: Internal Medicine

## 2018-03-09 ENCOUNTER — Encounter: Payer: Self-pay | Admitting: Internal Medicine

## 2018-03-09 VITALS — BP 128/84 | HR 62 | Ht 73.0 in | Wt 261.8 lb

## 2018-03-09 DIAGNOSIS — E890 Postprocedural hypothyroidism: Secondary | ICD-10-CM | POA: Diagnosis not present

## 2018-03-09 DIAGNOSIS — C73 Malignant neoplasm of thyroid gland: Secondary | ICD-10-CM | POA: Diagnosis not present

## 2018-03-09 NOTE — Progress Notes (Signed)
Patient ID: Timothy Conway, male   DOB: 08-24-1956, 62 y.o.   MRN: [id number]    HPI  Timothy Conway is a 62 y.o.-year-old male, initially referred by Dr. Harlow Asa, returning for follow-up for papillary thyroid cancer and postsurgical hypothyroidism.  Last visit 5 months ago.  After 2 weeks ago, he felt very poorly, with extreme fatigue and also 30 pound weight gain.  He called and we had him come back for labs.  His TSH was quite high, after which we increase his levothyroxine dose.  He started to feel better afterwards.  Reviewed his cancer history: Pt. has been found to have a hypermetabolic thyroid nodule on a PET scan (07/13/2017) obtained by Dr. Lamonte Sakai, his pulmonologist, to investigate a lung nodule.  A thyroid U/S (07/30/2017) showed a L lobe 1.3 x 1.0 x 1.2 cm nodule, solid, hypoechoic, lobulated/irregular, with peripheral calcifications. Bx of this nodule (07/30/2017): PTC  Total thyroidectomy (08/27/2017) by Dr. Harlow Asa: Diagnosis 1. Thyroid, thyroidectomy, total - PAPILLARY THYROID CARCINOMA, CLASSIC TYPE, SPANNING 1.4 CM. - NO EXTRATHYROIDAL EXTENSION. - RESECTION MARGINS ARE NEGATIVE. - SEE ONCOLOGY TABLE. 2. Lymph node, biopsy, central compartment - THREE OF THREE LYMPH NODES NEGATIVE FOR CARCINOMA (0/3). - ONE BENIGN PARATHYROID. Microscopic Comment 1. THYROID Specimen: Total thyroid with central compartment lymph nodes. Procedure (including lymph node sampling if applicable): Total thyroidectomy with central lymph node biopsy. Specimen Integrity (intact/fragmented): Intact. Tumor focality: Unifocal. Dominant tumor: Maximum tumor size (cm): 1.4 cm. Tumor laterality: Left. Histologic type (including subtype and/or unique features as applicable): Papillary thyroid carcinoma, classic type. Tumor capsule: Present. Extrathyroidal extension: Absent. Capsular invasion with degree of invasion if present: N/A. Margins: Negative. Lymphatic or vascular invasion: Not  identified. Lymph nodes: # examined 3; # positive; 0 Extracapsular extension (if applicable): N/A. TNM code: pT1b, pN0 Non-neoplastic thyroid: Largely unremarkable. Comments: There tumor is largely fibrotic and calcified with only a small portion of viable tumor. Vicente Males MD Pathologist, Electronic Signature (Case signed 08/30/2017)  Postsurgical hypothyroidism  Pt is on Synthroid 125 Mcg daily (increased 01/2018), taken: - at 4-5:30 am - fasting - at least 2-3 h from coffee + milk, b'fast - no Ca, Fe, PPIs - + MVI at night - + Zantac 2x a day >> moved later in the day - not on Biotin  Latest TSH was elevated: Lab Results  Component Value Date   TSH 12.75 (H) 02/11/2018   TSH 2.01 11/12/2017   TSH 3.67 10/01/2017   FREET4 0.62 02/11/2018   FREET4 0.70 11/12/2017   FREET4 0.77 10/01/2017    Pt denies: - feeling nodules in neck - hoarseness - dysphagia - choking - SOB with lying down  Unclear if he has family history of thyroid disease or thyroid cancer as he was adopted. No history of radiation therapy to head or neck  ROS: Constitutional:+ weight gain/no weight loss, + fatigue, no subjective hyperthermia, no subjective hypothermia Eyes: no blurry vision, no xerophthalmia ENT: no sore throat,+ see HPI Cardiovascular: no CP/no SOB/no palpitations/no leg swelling Respiratory: no cough/no SOB/no wheezing Gastrointestinal: no N/no V/no D/no C/no acid reflux Musculoskeletal: no muscle aches/no joint aches Skin: no rashes, no hair loss Neurological: no tremors/no numbness/no tingling/no dizziness  I reviewed pt's medications, allergies, PMH, social hx, family hx, and changes were documented in the history of present illness. Otherwise, unchanged from my initial visit note.  Past Medical History:  Diagnosis Date  . Anxiety   . COPD (chronic obstructive pulmonary disease) ([place])   .  Depression   . Dysrhythmia    hx of PAC's  . Gout   . Hyperlipidemia   .  Hypertension   . Thyroid cancer ([place])    2018   Past Surgical History:  Procedure Laterality Date  . ABDOMINAL SURGERY     hemmoraging ulcer  . CATARACT EXTRACTION Right   . FRACTURE SURGERY     Left arm  . THYROIDECTOMY N/A 08/27/2017   Procedure: TOTAL THYROIDECTOMY WITH LIMITED LYMPH NODE DISSECTION;  Surgeon: Armandina Gemma, MD;  Location: [facility];  Service: General;  Laterality: N/A;   Social History   Social History  . Marital status: Single    Spouse name: N/A  . Number of children: 0   Occupational History  . n/a   Social History Main Topics  . Smoking status: Current Every Day Smoker    Packs/day: 1.00    Years: 40.00    Types: Cigarettes  . Smokeless tobacco: Never Used     Comment: 1 1/2 per day  . Alcohol use 0.6 oz/week    3-4 Cans of beer per day  . Drug use: No   Current Outpatient Medications on File Prior to Visit  Medication Sig Dispense Refill  . ALPRAZolam (XANAX) 1 MG tablet Take 1 mg by mouth 3 (three) times daily as needed for anxiety.     . ARIPiprazole (ABILIFY) 5 MG tablet Take 5 mg by mouth at bedtime.     Marland Kitchen atenolol (TENORMIN) 100 MG tablet Take 100 mg by mouth 2 (two) times daily.    . Cholecalciferol (VITAMIN D) 2000 units tablet Take 2,000 Units by mouth daily.    . famotidine (PEPCID) 20 MG tablet Take 20 mg by mouth 2 (two) times daily.    . fluticasone (FLONASE) 50 MCG/ACT nasal spray Place 2 sprays into both nostrils daily as needed for allergies or rhinitis.    Marland Kitchen gemfibrozil (LOPID) 600 MG tablet Take 600 mg by mouth 2 (two) times daily before a meal.    . levothyroxine (SYNTHROID, LEVOTHROID) 125 MCG tablet Take 1 tablet (125 mcg total) by mouth daily. 90 tablet 3  . Multiple Vitamin (MULTIVITAMIN WITH MINERALS) TABS tablet Take 1 tablet by mouth daily.    . Tiotropium Bromide Monohydrate (SPIRIVA RESPIMAT) 2.5 MCG/ACT AERS Inhale 2 puffs into the lungs daily. 3 Inhaler 3   No current facility-administered medications on file prior to  visit.    No Known Allergies Family History  Adopted: Yes    PE: BP 128/84   Pulse 62   Ht 6' 1"  (1.854 m)   Wt 261 lb 12.8 oz (118.8 kg)   SpO2 95%   BMI 34.54 kg/m  Wt Readings from Last 3 Encounters:  03/09/18 261 lb 12.8 oz (118.8 kg)  02/28/18 260 lb (117.9 kg)  02/04/18 260 lb (117.9 kg)   Constitutional: overweight, in NAD Eyes: PERRLA, EOMI, no exophthalmos ENT: moist mucous membranes, no neck masses palpated, thyroidectomy scar healed, no cervical lymphadenopathy Cardiovascular: RRR, No MRG Respiratory: CTA B Gastrointestinal: abdomen soft, NT, ND, BS+ Musculoskeletal: no deformities, strength intact in all 4 Skin: moist, warm, no rashes Neurological: no tremor with outstretched hands, DTR normal in all 4  ASSESSMENT: 1. Thyroid cancer - see HPI  2. Postsurgical Hypothyroidism  PLAN:  1. Papillary Thyroid Cancer - Patient with history of classic type of papillary thyroid cancer, not very large, encapsulated, without extrathyroidal extension, without lymphovascular invasion and with negative lymph node biopsies.  Therefore, his cancer is  considered low risk.  At last visit, we discussed about the 2015 ATA guidelines for this type of tumors, which recommend follow-up without radioactive iodine treatment.  We decided to follow him with thyroglobulin as a tumor marker, and also with neck ultrasounds.  The studies show that even if we end up using RAI treatment as salvage therapy in the future, his prognosis is not changed. He appeared relieved that he did not need RAI treatment. - We will check thyroglobulin at next lab draw in 2-3 weeks and will check a neck ultrasound a year after his surgery - I will see him back in 6 months.  2. Patient with h/o total thyroidectomy for cancer, now with iatrogenic hypothyroidism, on levothyroxine therapy. He appears euthyroid, but c/o weight gain and fatigue -however, these are improving. I suspect his LT4 dose may be too low and  will adjust this based on upcoming labs - latest thyroid labs reviewed with pt >> TSH high ~3 weeks ago: Lab Results  Component Value Date   TSH 12.75 (H) 02/11/2018  - he continues on LT4 125 mcg daily (increased after last TSH) - pt feels better on this dose. - we discussed about taking the thyroid hormone every day, with water, >30 minutes before breakfast, separated by >4 hours from acid reflux medications, calcium, iron, multivitamins. Pt. is taking it correctly. - will check thyroid tests in 2-3 more weeks: TSH and fT4 - If labs are abnormal, he will need to return for repeat TFTs in 1.5 months  Patient Instructions  Please continue Synthroid 125 mcg daily  Take the thyroid hormone every day, with water, at least 30 minutes before breakfast, separated by at least 4 hours from: - acid reflux medications - calcium - iron - multivitamins  Please come back for labs in 2-3 more weeks.  Please come back for a follow-up appointment in 6 months.  Component     Latest Ref Rng & Units 03/23/2018  Thyroglobulin     ng/mL 0.1 (L)  Comment        TSH     0.35 - 4.50 uIU/mL 0.52  T4,Free(Direct)     0.60 - 1.60 ng/dL 0.87  Thyroglobulin Ab     < or = 1 IU/mL <1  Labs are excellent.  Philemon Kingdom, MD PhD [facility] Endocrinology

## 2018-03-09 NOTE — Patient Instructions (Signed)
Please continue Synthroid 125 mcg daily  Take the thyroid hormone every day, with water, at least 30 minutes before breakfast, separated by at least 4 hours from: - acid reflux medications - calcium - iron - multivitamins  Please come back for labs in 2-3 more weeks.  Please come back for a follow-up appointment in 6 months.

## 2018-03-23 ENCOUNTER — Other Ambulatory Visit: Payer: Medicare HMO

## 2018-03-23 ENCOUNTER — Other Ambulatory Visit (INDEPENDENT_AMBULATORY_CARE_PROVIDER_SITE_OTHER): Payer: Medicare HMO

## 2018-03-23 DIAGNOSIS — E890 Postprocedural hypothyroidism: Secondary | ICD-10-CM

## 2018-03-23 DIAGNOSIS — C73 Malignant neoplasm of thyroid gland: Secondary | ICD-10-CM | POA: Diagnosis not present

## 2018-03-23 LAB — TSH: TSH: 0.52 u[IU]/mL (ref 0.35–4.50)

## 2018-03-23 LAB — T4, FREE: Free T4: 0.87 ng/dL (ref 0.60–1.60)

## 2018-03-24 LAB — THYROGLOBULIN LEVEL: THYROGLOBULIN: 0.1 ng/mL — AB

## 2018-03-24 LAB — THYROGLOBULIN ANTIBODY: Thyroglobulin Ab: <1 IU/mL (ref <=1)

## 2018-04-06 DIAGNOSIS — F419 Anxiety disorder, unspecified: Secondary | ICD-10-CM | POA: Diagnosis not present

## 2018-04-06 DIAGNOSIS — J449 Chronic obstructive pulmonary disease, unspecified: Secondary | ICD-10-CM | POA: Diagnosis not present

## 2018-04-06 DIAGNOSIS — Z79899 Other long term (current) drug therapy: Secondary | ICD-10-CM | POA: Diagnosis not present

## 2018-04-06 DIAGNOSIS — Z0001 Encounter for general adult medical examination with abnormal findings: Secondary | ICD-10-CM | POA: Diagnosis not present

## 2018-04-06 DIAGNOSIS — F17210 Nicotine dependence, cigarettes, uncomplicated: Secondary | ICD-10-CM | POA: Diagnosis not present

## 2018-04-06 DIAGNOSIS — I719 Aortic aneurysm of unspecified site, without rupture: Secondary | ICD-10-CM | POA: Diagnosis not present

## 2018-04-06 DIAGNOSIS — R911 Solitary pulmonary nodule: Secondary | ICD-10-CM | POA: Diagnosis not present

## 2018-04-06 DIAGNOSIS — E559 Vitamin D deficiency, unspecified: Secondary | ICD-10-CM | POA: Diagnosis not present

## 2018-04-06 DIAGNOSIS — Z125 Encounter for screening for malignant neoplasm of prostate: Secondary | ICD-10-CM | POA: Diagnosis not present

## 2018-04-06 DIAGNOSIS — F321 Major depressive disorder, single episode, moderate: Secondary | ICD-10-CM | POA: Diagnosis not present

## 2018-04-06 DIAGNOSIS — E7800 Pure hypercholesterolemia, unspecified: Secondary | ICD-10-CM | POA: Diagnosis not present

## 2018-04-18 ENCOUNTER — Other Ambulatory Visit: Payer: Self-pay | Admitting: *Deleted

## 2018-04-18 MED ORDER — LEVOTHYROXINE SODIUM 125 MCG PO TABS: 125.0000 ug | ORAL_TABLET | Freq: Every day | ORAL | 3 refills | Status: DC

## 2018-05-10 ENCOUNTER — Other Ambulatory Visit: Payer: Self-pay

## 2018-05-10 ENCOUNTER — Ambulatory Visit
Admission: RE | Admit: 2018-05-10 | Discharge: 2018-05-10 | Disposition: A | Discharge status: Home / Self Care | Payer: Medicare HMO | Source: Ambulatory Visit | Attending: Thoracic Surgery (Cardiothoracic Vascular Surgery) | Admitting: Thoracic Surgery (Cardiothoracic Vascular Surgery)

## 2018-05-10 ENCOUNTER — Encounter: Payer: Self-pay | Admitting: Thoracic Surgery (Cardiothoracic Vascular Surgery)

## 2018-05-10 ENCOUNTER — Other Ambulatory Visit: Payer: Self-pay | Admitting: *Deleted

## 2018-05-10 ENCOUNTER — Ambulatory Visit: Payer: Medicare HMO | Admitting: Thoracic Surgery (Cardiothoracic Vascular Surgery)

## 2018-05-10 VITALS — BP 124/70 | HR 62 | Resp 16 | Ht 73.0 in | Wt 260.8 lb

## 2018-05-10 DIAGNOSIS — D381 Neoplasm of uncertain behavior of trachea, bronchus and lung: Secondary | ICD-10-CM

## 2018-05-10 DIAGNOSIS — R911 Solitary pulmonary nodule: Secondary | ICD-10-CM | POA: Diagnosis not present

## 2018-05-10 IMAGING — CT CT CHEST W/O CM
2 of 4 series · 14 of 36 positions shown, 17 images · non-contrast
Comparison: [DATE] PET-CT.  [DATE] chest CT.

CLINICAL DATA: Follow-up left upper lobe pulmonary nodule. History
of papillary thyroid cancer.

EXAM:
CT CHEST WITHOUT CONTRAST
TECHNIQUE: Multidetector CT imaging of the chest was performed following the
standard protocol without IV contrast.

[Series 2: chest 2.00 br40 s3 ax · axial · 0.73mm/px · z∈[+1494,+1780]mm · 11 of 169 slices shown, 14 images]
[im 13/169  mediastinal]
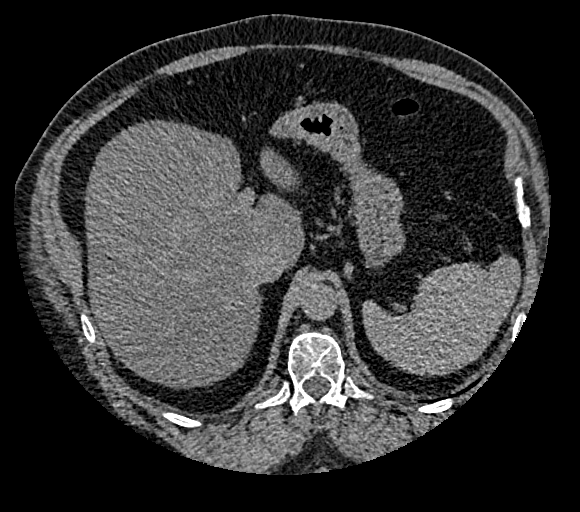
[im 13/169  lung]
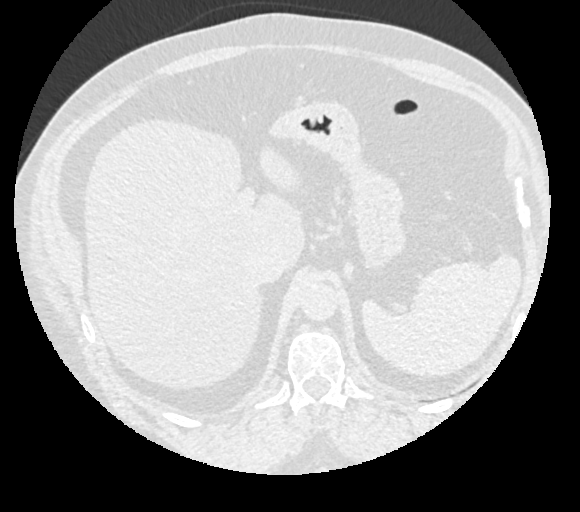
[im 26/169  lung]
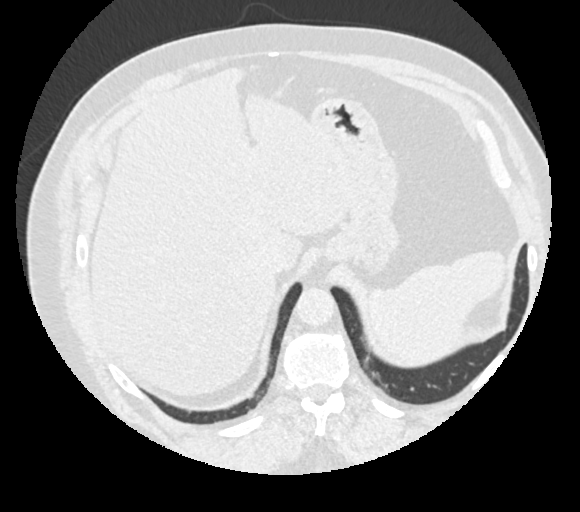
[im 39/169  lung]
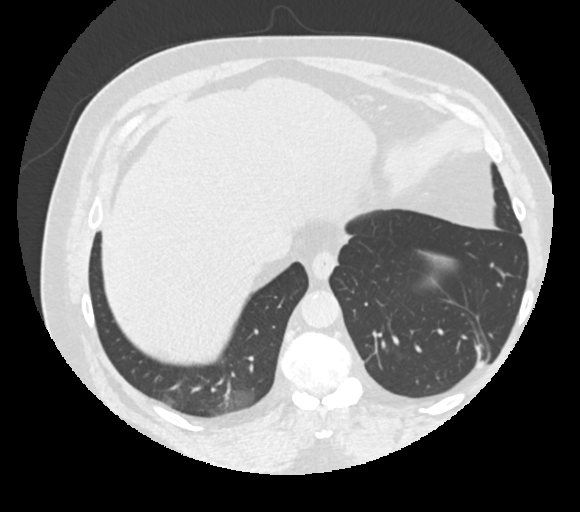
[im 52/169  lung]
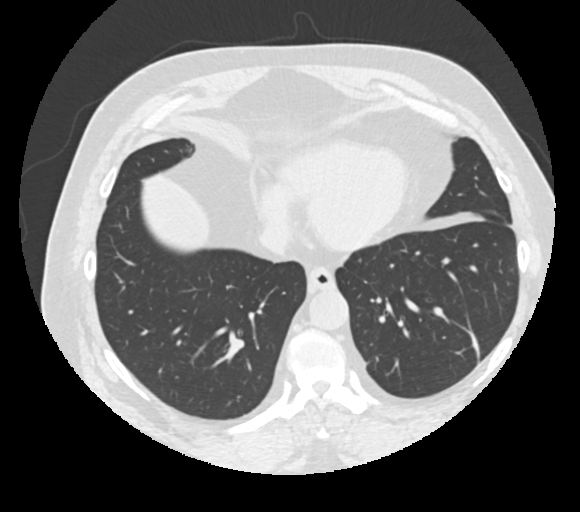
[im 65/169  mediastinal]
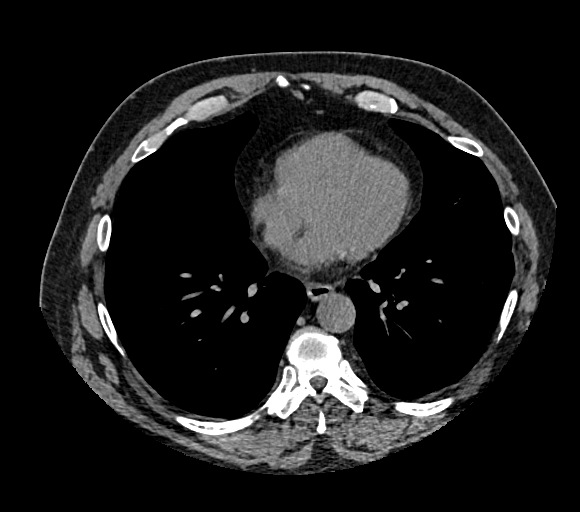
[im 65/169  lung]
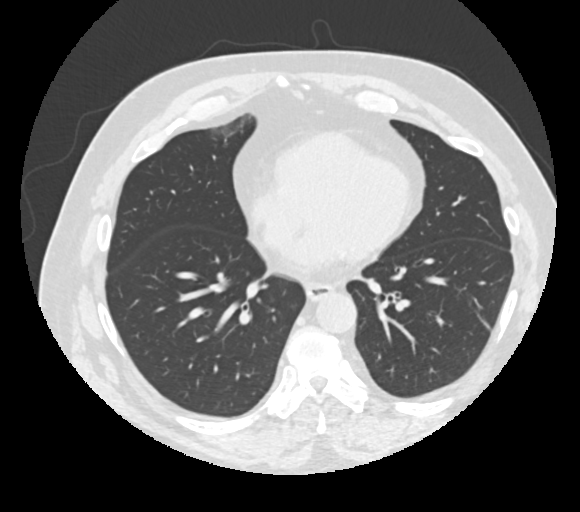
[im 91/169  lung]
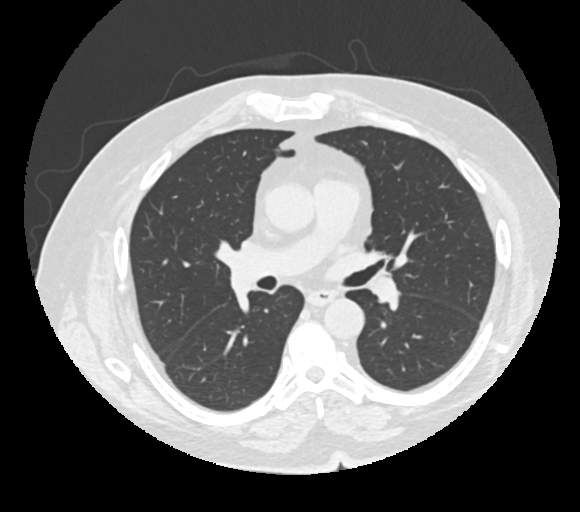
[im 104/169  lung]
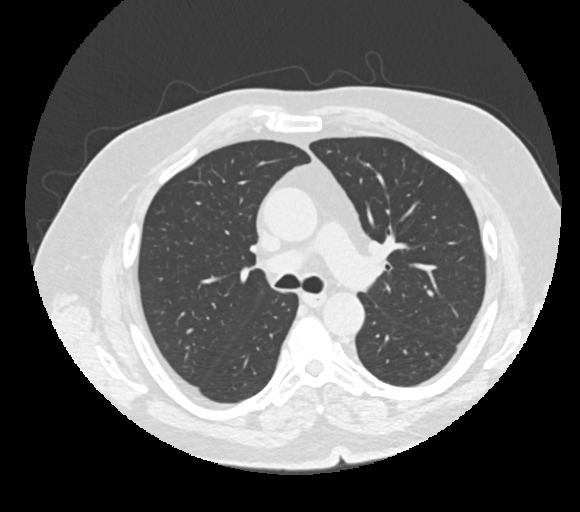
[im 117/169  lung]
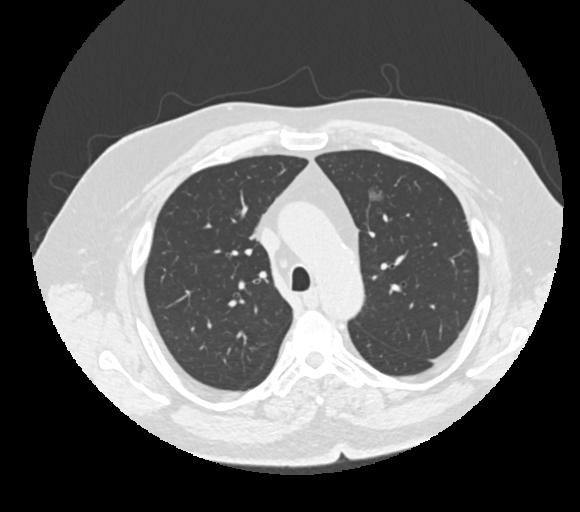
[im 130/169  mediastinal]
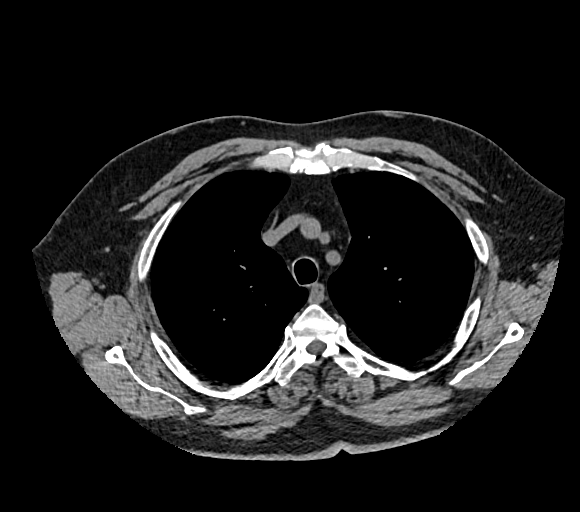
[im 130/169  lung]
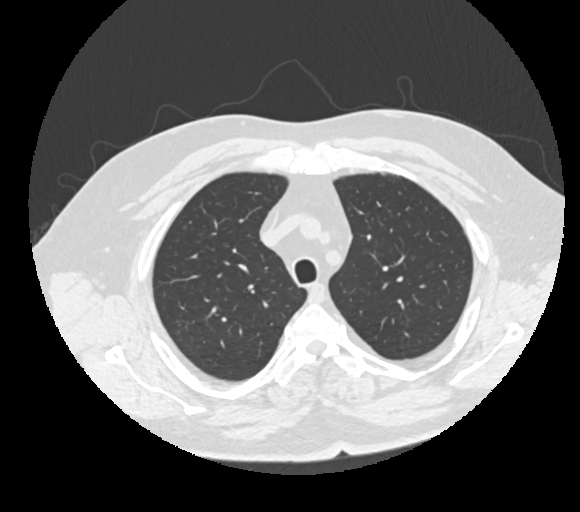
[im 143/169  lung]
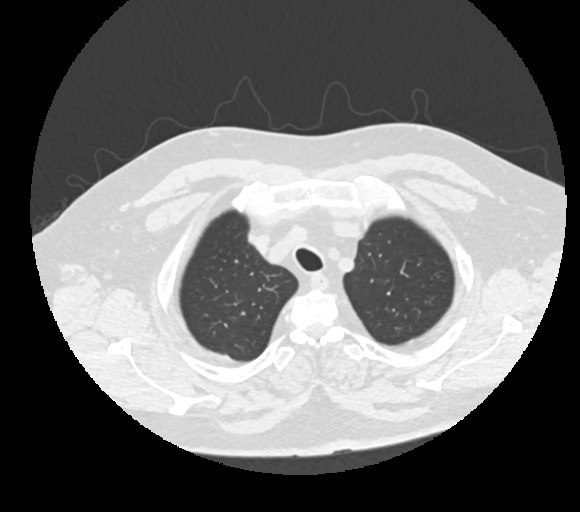
[im 156/169  lung]
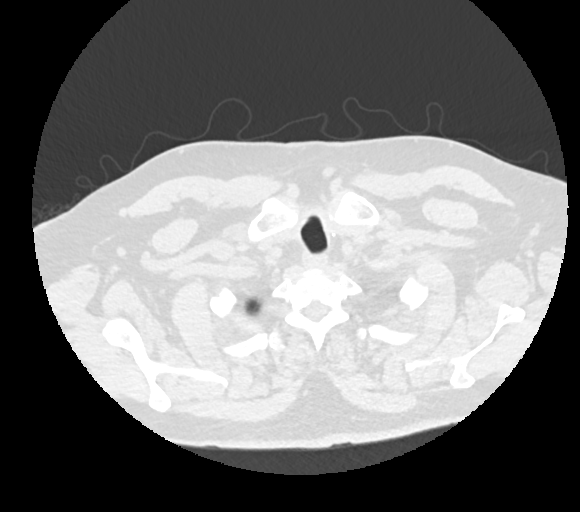

[Series 4: chest 2.00 br40 s3 cor · coronal · 0.66mm/px · 3 of 174 slices shown]
[im 35/174  lung]
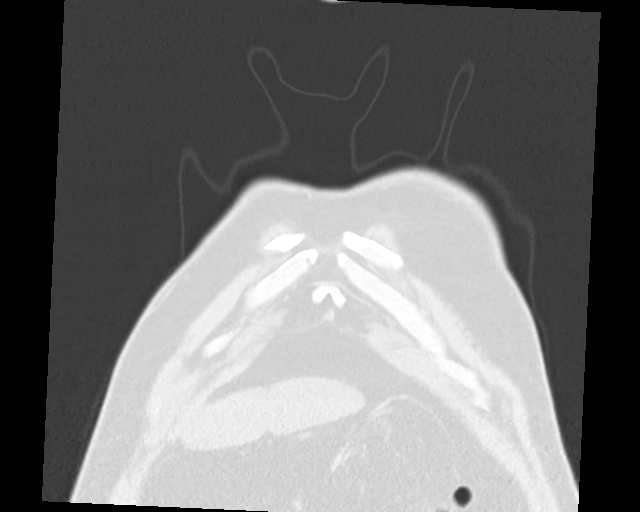
[im 70/174  lung]
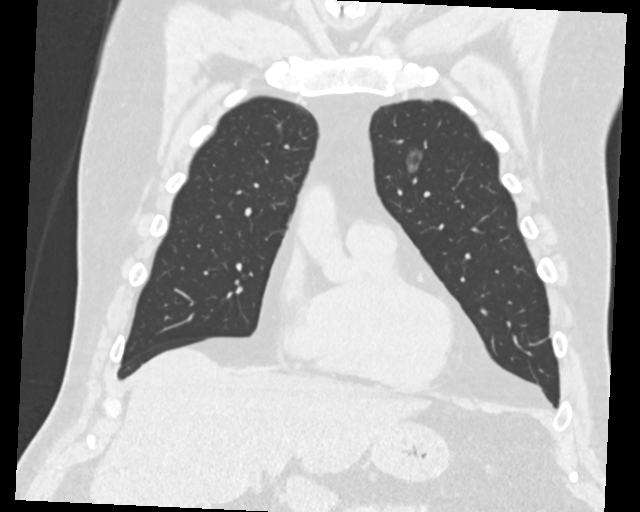
[im 104/174  lung]
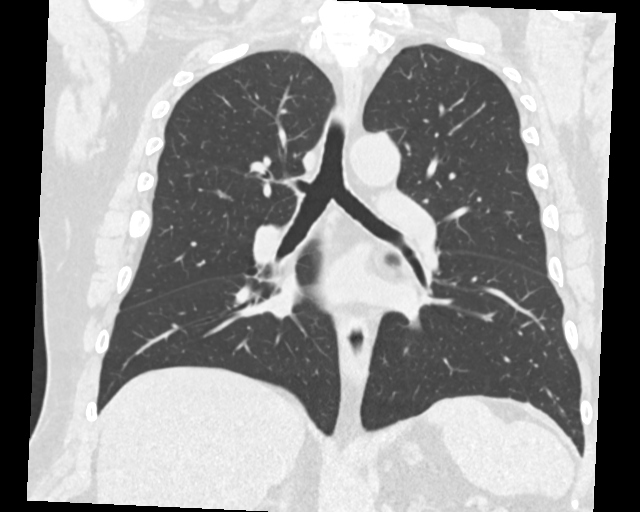

[14 of 36 positions shown; findings below may reference images not displayed]

FINDINGS: Cardiovascular: Normal heart size. No significant pericardial
effusion/thickening. Atherosclerotic nonaneurysmal thoracic aorta.
Normal caliber pulmonary arteries.

Mediastinum/Nodes: Total thyroidectomy. Unremarkable esophagus. No
pathologically enlarged axillary, mediastinal or hilar lymph nodes,
noting limited sensitivity for the detection of hilar adenopathy on
this noncontrast study.

Lungs/Pleura: No pneumothorax. No pleural effusion. Left upper lobe
1.3 x 1.0 cm ground-glass pulmonary nodule (series 8/image 54),
previously 1.3 x 1.0 cm on [DATE] chest CT using similar
measurement technique, stable. No acute consolidative airspace
disease, lung masses or new significant pulmonary nodules. Stable
mildly thickened parenchymal band in the peripheral left lower lobe,
compatible with postinfectious/postinflammatory scarring.

Upper abdomen: Diffuse hepatic steatosis.

Musculoskeletal: No aggressive appearing focal osseous lesions.
Healed deformity in the lateral right sixth rib. Mild thoracic
spondylosis.
IMPRESSION: 1. Continued stability of left upper lobe 1.3 cm ground-glass
pulmonary nodule. Follow-up chest CT is recommended every 2 years
until 5 years of stability has been established. This recommendation
follows the consensus statement: Guidelines for Management of
Incidental Pulmonary Nodules Detected on CT Images:From the
[HOSPITAL] [5W]; published online before print
(10.1148/radiol.[PHONE_NUMBER]).
2. Diffuse hepatic steatosis.

Aortic Atherosclerosis ([5W]-[5W]).

## 2018-05-10 NOTE — Progress Notes (Signed)
[address]       [place] [address]             [phone]     HPI: [name] returns to discuss management of his left upper lobe nodule.  [name] is a 62 year old man with a history of papillary thyroid cancer status post thyroidectomy, 60-pack-year history of tobacco abuse, COPD, hypertension, hyperlipidemia, penetrating ulcer of the aortic arch, anxiety, and depression.  He was found to have a left upper lobe lung nodule and a small penetrating ulcer of the aortic arch on the CT scan back in 2016.  He said serial CTs since then.  A PET/CT in August 2018 showed no activity in the left upper lobe nodule.  There was a hypermetabolic thyroid nodule that turned out to be a papillary carcinoma.  There also was an abnormality in the cecum which turned out to be polyps.   On his CT in February the left upper lobe groundglass opacity was noted to have increased in size.  I saw him in consultation in April.  We discussed various methods of biopsy and treatment of the nodule including bronchoscopic biopsy, CT-guided biopsy, wedge resection or segmentectomy and surgery versus radiation.  He was feeling poorly and was severely hypothyroid at that time.  He asked to wait until now to further discuss the matter.  He now returns with a 63-month interval CT scan.  In the interim he is continued to smoke about a pack a day.  He says he is been worried to death about what to do with this nodule.  He is "terrified" of having surgery.    Past Medical History:  Diagnosis Date  . Anxiety   . COPD (chronic obstructive pulmonary disease) ([place])   . Depression   . Dysrhythmia    hx of PAC's  . Gout   . Hyperlipidemia   . Hypertension   . Thyroid cancer ([place])    2018  \ Current Outpatient Medications  Medication Sig Dispense Refill  . ALPRAZolam (XANAX) 1 MG tablet Take 1 mg by mouth 3 (three) times daily as needed for anxiety.     . ARIPiprazole (ABILIFY) 5 MG tablet Take 5 mg  by mouth at bedtime.     Marland Kitchen atenolol (TENORMIN) 100 MG tablet Take 100 mg by mouth 2 (two) times daily.    . Cholecalciferol (VITAMIN D) 2000 units tablet Take 2,000 Units by mouth daily.    . FENOFIBRATE PO Take by mouth.    . fluticasone (FLONASE) 50 MCG/ACT nasal spray Place 2 sprays into both nostrils daily as needed for allergies or rhinitis.    Marland Kitchen levothyroxine (SYNTHROID, LEVOTHROID) 125 MCG tablet Take 1 tablet (125 mcg total) by mouth daily. 90 tablet 3  . Multiple Vitamin (MULTIVITAMIN WITH MINERALS) TABS tablet Take 1 tablet by mouth daily.    . Rosuvastatin Calcium (CRESTOR PO) Take by mouth.    . Tiotropium Bromide Monohydrate (SPIRIVA RESPIMAT) 2.5 MCG/ACT AERS Inhale 2 puffs into the lungs daily. 3 Inhaler 3   No current facility-administered medications for this visit.     Physical Exam BP 124/70 (BP Location: Right Arm, Patient Position: Sitting, Cuff Size: Large)   Pulse 62   Resp 16   Ht 6\' 1"  (1.854 m)   Wt 260 lb 12.8 oz (118.3 kg)   SpO2 96% Comment: ON RA  BMI 34.41 kg/m  Anxious 62 year old man in no acute distress Alert and oriented x3  with no focal deficits Lungs clear with equal breath sounds bilaterally Cardiac regular rate and rhythm normal S1-S2 Abdomen soft nontender No clubbing cyanosis or edema  Diagnostic Tests: CT CHEST WITHOUT CONTRAST  TECHNIQUE: Multidetector CT imaging of the chest was performed following the standard protocol without IV contrast.  COMPARISON:  07/13/2017 PET-CT.  01/04/2018 chest CT.  FINDINGS: Cardiovascular: Normal heart size. No significant pericardial effusion/thickening. Atherosclerotic nonaneurysmal thoracic aorta. Normal caliber pulmonary arteries.  Mediastinum/Nodes: Total thyroidectomy. Unremarkable esophagus. No pathologically enlarged axillary, mediastinal or hilar lymph nodes, noting limited sensitivity for the detection of hilar adenopathy on this noncontrast study.  Lungs/Pleura: No pneumothorax.  No pleural effusion. Left upper lobe 1.3 x 1.0 cm ground-glass pulmonary nodule (series 8/image 54), previously 1.3 x 1.0 cm on 01/04/2018 chest CT using similar measurement technique, stable. No acute consolidative airspace disease, lung masses or new significant pulmonary nodules. Stable mildly thickened parenchymal band in the peripheral left lower lobe, compatible with postinfectious/postinflammatory scarring.  Upper abdomen: Diffuse hepatic steatosis.  Musculoskeletal: No aggressive appearing focal osseous lesions. Healed deformity in the lateral right sixth rib. Mild thoracic spondylosis.  IMPRESSION: 1. Continued stability of left upper lobe 1.3 cm ground-glass pulmonary nodule. Follow-up chest CT is recommended every 2 years until 5 years of stability has been established. This recommendation follows the consensus statement: Guidelines for Management of Incidental Pulmonary Nodules Detected on CT Images:From the Fleischner Society 2017; published online before print (10.1148/radiol.2542706237). 2. Diffuse hepatic steatosis.  Aortic Atherosclerosis (ICD10-I70.0).   Electronically Signed   By: Ilona Sorrel M.D.   On: 05/10/2018 12:32 I personally reviewed the CT images and concur with the findings but not necessarily the recommendations noted above.  Impression: [name] is a 62 year old gentleman with history of tobacco abuse who has a left upper lobe groundglass opacity this been present since 2016.  This nodule has been very slowly increasing in size but has not developed any solid component to date.  The nodule had no hypermetabolic activity on a PET/CT in August 2018.  A CT in February showed some interval growth.  It is hard to see much growth from scan to scan but going back to 2017 is been a definite increase in the size of the nodule.  Given that Dr. Lamonte Sakai referred him to me.  I discussed the options of biopsy and stereotactic radiation versus surgical  resection.  He was unsure as to how to proceed.  He felt poorly because of his hypothyroidism and asked to come back at this time.  Since it was going to be 4 months I recommended a repeat CT.  The nodule is unchanged from the scan in February.  I emphasized to him that that was not a surprise given the very slow growth of the nodule over time.  I once again discussed our options.  Radiographic follow-up remains an option, but I do not recommend that based on the fact that the nodule has grown over time already.  A second option would be navigational bronchoscopy followed by stereotactic radiation.  Third option would be surgery for wedge resection or segmentectomy depending on intraoperative findings.  I do think the nodule be very difficult to localize minimally invasively.  I think navigational bronchoscopy for fiducial placement prior to surgery would be potentially very helpful in identifying the nodule in the operating room.  Therefore with either of these approaches the next step would be navigational bronchoscopy and fiducial placement.  I discussed the proposed procedure with him.  He  understands this will be done in the operating room under general anesthesia.  We will plan to do it as an outpatient.  There are no incisions involved.  He can essentially resume full activities the following day.  I discussed the indications, risks, benefits, and alternatives.  He understands the risk include those associated with general anesthesia which can include death, stroke, MI, blood clots, as well as other unpredictable complications.  The primary complication with navigational bronchoscopy would be a nondiagnostic study and he understands there is a significant chance of that.  There also is a small chance of bleeding or pneumothorax.  He is agreeable to proceed with navigational bronchoscopy but wants to wait till after his birthday in July.  We have tenably scheduled him for Monday, July 8  Melrose Nakayama, MD [facility] [phone]

## 2018-05-16 ENCOUNTER — Telehealth: Payer: Self-pay

## 2018-05-16 DIAGNOSIS — R0602 Shortness of breath: Secondary | ICD-10-CM | POA: Diagnosis not present

## 2018-05-16 DIAGNOSIS — F329 Major depressive disorder, single episode, unspecified: Secondary | ICD-10-CM | POA: Diagnosis not present

## 2018-05-16 DIAGNOSIS — J449 Chronic obstructive pulmonary disease, unspecified: Secondary | ICD-10-CM | POA: Diagnosis not present

## 2018-05-16 DIAGNOSIS — Z87891 Personal history of nicotine dependence: Secondary | ICD-10-CM | POA: Diagnosis not present

## 2018-05-16 DIAGNOSIS — F17200 Nicotine dependence, unspecified, uncomplicated: Secondary | ICD-10-CM | POA: Diagnosis not present

## 2018-05-16 NOTE — Telephone Encounter (Signed)
Timothy Conway called concerned that he had the wrong CT scan results.  After reviewing the CT results and notes from his previous appointment with Dr. Roxan Hockey on 05/10/2018, it was apparent that they were correct.  He seemed unaware of the most recent results of the increase in size of his known pulmonary nodule despite coming to the office to speak with Dr. Roxan Hockey about them. All of his questions were answered and no other questions or concerns.  He is aware of his upcoming appointments and thanked me for the prompt call back.

## 2018-05-18 ENCOUNTER — Telehealth: Payer: Self-pay

## 2018-05-18 NOTE — Telephone Encounter (Signed)
[name] has had numerous conversations about his CT scan results.  He feels that the most recent scan read on 05/10/18 is wrong and wants it re-read.  He stated that he has cancelled his scheduled surgery appointment.  An appointment was set up for him to come to the office to speak with Dr. Roxan Hockey about the results, to which he stated that he would not come until the CT scan was re-read.  Awaiting further instruction from management/ physician.

## 2018-05-20 ENCOUNTER — Other Ambulatory Visit: Payer: Self-pay | Admitting: *Deleted

## 2018-05-24 ENCOUNTER — Ambulatory Visit: Payer: Medicare HMO | Admitting: Thoracic Surgery (Cardiothoracic Vascular Surgery)

## 2018-05-30 ENCOUNTER — Other Ambulatory Visit (HOSPITAL_COMMUNITY): Payer: Medicare HMO

## 2018-05-30 DIAGNOSIS — F329 Major depressive disorder, single episode, unspecified: Secondary | ICD-10-CM | POA: Diagnosis not present

## 2018-05-30 DIAGNOSIS — R918 Other nonspecific abnormal finding of lung field: Secondary | ICD-10-CM | POA: Diagnosis not present

## 2018-05-30 DIAGNOSIS — J449 Chronic obstructive pulmonary disease, unspecified: Secondary | ICD-10-CM | POA: Diagnosis not present

## 2018-05-30 DIAGNOSIS — R0602 Shortness of breath: Secondary | ICD-10-CM | POA: Diagnosis not present

## 2018-06-06 ENCOUNTER — Ambulatory Visit (HOSPITAL_COMMUNITY)
Admission: RE | Admit: 2018-06-06 | Payer: Medicare HMO | Source: Ambulatory Visit | Admitting: Thoracic Surgery (Cardiothoracic Vascular Surgery)

## 2018-06-06 ENCOUNTER — Encounter (HOSPITAL_COMMUNITY): Admission: RE | Payer: Self-pay | Source: Ambulatory Visit

## 2018-06-06 SURGERY — VIDEO BRONCHOSCOPY WITH ENDOBRONCHIAL NAVIGATION
Anesthesia: General

## 2018-06-10 DIAGNOSIS — E7800 Pure hypercholesterolemia, unspecified: Secondary | ICD-10-CM | POA: Diagnosis not present

## 2018-06-20 DIAGNOSIS — R918 Other nonspecific abnormal finding of lung field: Secondary | ICD-10-CM | POA: Diagnosis not present

## 2018-06-27 DIAGNOSIS — J449 Chronic obstructive pulmonary disease, unspecified: Secondary | ICD-10-CM | POA: Diagnosis not present

## 2018-06-27 DIAGNOSIS — F17200 Nicotine dependence, unspecified, uncomplicated: Secondary | ICD-10-CM | POA: Diagnosis not present

## 2018-06-27 DIAGNOSIS — R918 Other nonspecific abnormal finding of lung field: Secondary | ICD-10-CM | POA: Diagnosis not present

## 2018-06-27 DIAGNOSIS — Z87891 Personal history of nicotine dependence: Secondary | ICD-10-CM | POA: Diagnosis not present

## 2018-06-27 DIAGNOSIS — Z6835 Body mass index (BMI) 35.0-35.9, adult: Secondary | ICD-10-CM | POA: Diagnosis not present

## 2018-07-12 DIAGNOSIS — R7302 Impaired glucose tolerance (oral): Secondary | ICD-10-CM | POA: Diagnosis not present

## 2018-07-12 DIAGNOSIS — R062 Wheezing: Secondary | ICD-10-CM | POA: Diagnosis not present

## 2018-07-12 DIAGNOSIS — H109 Unspecified conjunctivitis: Secondary | ICD-10-CM | POA: Diagnosis not present

## 2018-07-12 DIAGNOSIS — B9689 Other specified bacterial agents as the cause of diseases classified elsewhere: Secondary | ICD-10-CM | POA: Diagnosis not present

## 2018-07-12 DIAGNOSIS — J329 Chronic sinusitis, unspecified: Secondary | ICD-10-CM | POA: Diagnosis not present

## 2018-08-03 DIAGNOSIS — H1033 Unspecified acute conjunctivitis, bilateral: Secondary | ICD-10-CM | POA: Diagnosis not present

## 2018-08-17 DIAGNOSIS — H10023 Other mucopurulent conjunctivitis, bilateral: Secondary | ICD-10-CM | POA: Diagnosis not present

## 2018-09-01 ENCOUNTER — Encounter: Payer: Self-pay | Admitting: Internal Medicine

## 2018-09-08 ENCOUNTER — Ambulatory Visit: Payer: Medicare HMO | Admitting: Internal Medicine

## 2018-09-08 ENCOUNTER — Encounter: Payer: Self-pay | Admitting: Internal Medicine

## 2018-09-08 VITALS — BP 130/80 | HR 72 | Ht 73.0 in | Wt 274.0 lb

## 2018-09-08 DIAGNOSIS — Z23 Encounter for immunization: Secondary | ICD-10-CM

## 2018-09-08 DIAGNOSIS — C73 Malignant neoplasm of thyroid gland: Secondary | ICD-10-CM

## 2018-09-08 DIAGNOSIS — E890 Postprocedural hypothyroidism: Secondary | ICD-10-CM | POA: Diagnosis not present

## 2018-09-08 LAB — TSH: TSH: 4.66 u[IU]/mL — ABNORMAL HIGH (ref 0.35–4.50)

## 2018-09-08 LAB — T4, FREE: Free T4: 0.73 ng/dL (ref 0.60–1.60)

## 2018-09-08 NOTE — Patient Instructions (Signed)
Please continue Synthroid 125 mcg daily  Take the thyroid hormone every day, with water, at least 30 minutes before breakfast, separated by at least 4 hours from: - acid reflux medications - calcium - iron - multivitamins  We will call you to schedule the neck U/S.  Please come back for a follow-up appointment in 6 months.

## 2018-09-08 NOTE — Progress Notes (Signed)
Patient ID: Timothy Conway, male   DOB: [birth year], 62 y.o.   MRN: [id number]    HPI  Timothy Conway is a 62 y.o.-year-old male, initially referred by Dr. Harlow Asa, returning for follow-up for papillary thyroid cancer and postsurgical hypothyroidism.  Last visit 6 months ago.  At this visit, he describes extreme fatigue and having to stay in bed most of the day.  He also gained 14 pounds since last visit.  Reviewed cancer history: Pt. has been found to have a hypermetabolic thyroid nodule on a PET scan (07/13/2017) obtained by Dr. Lamonte Sakai, his pulmonologist, to investigate a lung nodule.  A thyroid U/S (07/30/2017) showed a L lobe 1.3 x 1.0 x 1.2 cm nodule, solid, hypoechoic, lobulated/irregular, with peripheral calcifications. Bx of this nodule (07/30/2017): PTC  Total thyroidectomy (08/27/2017) by Dr. Harlow Asa: Diagnosis 1. Thyroid, thyroidectomy, total - PAPILLARY THYROID CARCINOMA, CLASSIC TYPE, SPANNING 1.4 CM. - NO EXTRATHYROIDAL EXTENSION. - RESECTION MARGINS ARE NEGATIVE. - SEE ONCOLOGY TABLE. 2. Lymph node, biopsy, central compartment - THREE OF THREE LYMPH NODES NEGATIVE FOR CARCINOMA (0/3). - ONE BENIGN PARATHYROID. Microscopic Comment 1. THYROID Specimen: Total thyroid with central compartment lymph nodes. Procedure (including lymph node sampling if applicable): Total thyroidectomy with central lymph node biopsy. Specimen Integrity (intact/fragmented): Intact. Tumor focality: Unifocal. Dominant tumor: Maximum tumor size (cm): 1.4 cm. Tumor laterality: Left. Histologic type (including subtype and/or unique features as applicable): Papillary thyroid carcinoma, classic type. Tumor capsule: Present. Extrathyroidal extension: Absent. Capsular invasion with degree of invasion if present: N/A. Margins: Negative. Lymphatic or vascular invasion: Not identified. Lymph nodes: # examined 3; # positive; 0 Extracapsular extension (if applicable): N/A. TNM code: pT1b,  pN0 Non-neoplastic thyroid: Largely unremarkable. Comments: There tumor is largely fibrotic and calcified with only a small portion of viable tumor. Vicente Males MD Pathologist, Electronic Signature (Case signed 08/30/2017)  Postsurgical hypothyroidism  Pt is on Synthroid d.a.w. 125 Mcg daily, taken: - 5-8 a.m. - fasting - 30 min from Atenolol - 30 min from coffee with milk  - skips breakfast - + Multivitamins at night - + Prevacid at night - no Ca, Fe - not on Biotin  Latest TSH was excellent: Lab Results  Component Value Date   TSH 0.52 03/23/2018   TSH 12.75 (H) 02/11/2018   TSH 2.01 11/12/2017   TSH 3.67 10/01/2017   FREET4 0.87 03/23/2018   FREET4 0.62 02/11/2018   FREET4 0.70 11/12/2017   FREET4 0.77 10/01/2017    Pt denies: - feeling nodules in neck - hoarseness - dysphagia - choking - SOB with lying down  It is unclear if he had family history of thyroid disease or thyroid cancer as he was adopted.  No history of radiation therapy to head or neck.  He continues to smoke.  ROS: Constitutional: + Weight gain/no weight loss, + fatigue, no subjective hyperthermia, no subjective hypothermia Eyes: no blurry vision, no xerophthalmia ENT: no sore throat, + see HPI Cardiovascular: no CP/no SOB/no palpitations/no leg swelling Respiratory: no cough/no SOB/no wheezing Gastrointestinal: no N/no V/no D/no C/no acid reflux Musculoskeletal: no muscle aches/no joint aches Skin: no rashes, no hair loss Neurological: no tremors/no numbness/no tingling/no dizziness  I reviewed pt's medications, allergies, PMH, social hx, family hx, and changes were documented in the history of present illness. Otherwise, unchanged from my initial visit note.  Past Medical History:  Diagnosis Date  . Anxiety   . COPD (chronic obstructive pulmonary disease) ([place])   . Depression   . Dysrhythmia  hx of PAC's  . Gout   . Hyperlipidemia   . Hypertension   . Thyroid cancer ([place])     2018   Past Surgical History:  Procedure Laterality Date  . ABDOMINAL SURGERY     hemmoraging ulcer  . CATARACT EXTRACTION Right   . FRACTURE SURGERY     Left arm  . THYROIDECTOMY N/A 08/27/2017   Procedure: TOTAL THYROIDECTOMY WITH LIMITED LYMPH NODE DISSECTION;  Surgeon: Armandina Gemma, MD;  Location: [facility];  Service: General;  Laterality: N/A;   Social History   Social History  . Marital status: Single    Spouse name: N/A  . Number of children: 0   Occupational History  . n/a   Social History Main Topics  . Smoking status: Current Every Day Smoker    Packs/day: 1.00    Years: 40.00    Types: Cigarettes  . Smokeless tobacco: Never Used     Comment: 1 1/2 per day  . Alcohol use 0.6 oz/week    3-4 Cans of beer per day  . Drug use: No   Current Outpatient Medications on File Prior to Visit  Medication Sig Dispense Refill  . ALPRAZolam (XANAX) 1 MG tablet Take 1 mg by mouth 3 (three) times daily as needed for anxiety.     . ARIPiprazole (ABILIFY) 5 MG tablet Take 5 mg by mouth at bedtime.     Marland Kitchen atenolol (TENORMIN) 100 MG tablet Take 100 mg by mouth 2 (two) times daily.    . Cholecalciferol (VITAMIN D) 2000 units tablet Take 2,000 Units by mouth daily.    . FENOFIBRATE PO Take by mouth.    . fluticasone (FLONASE) 50 MCG/ACT nasal spray Place 2 sprays into both nostrils daily as needed for allergies or rhinitis.    Marland Kitchen levothyroxine (SYNTHROID, LEVOTHROID) 125 MCG tablet Take 1 tablet (125 mcg total) by mouth daily. 90 tablet 3  . Multiple Vitamin (MULTIVITAMIN WITH MINERALS) TABS tablet Take 1 tablet by mouth daily.    . Rosuvastatin Calcium (CRESTOR PO) Take by mouth.    . Tiotropium Bromide Monohydrate (SPIRIVA RESPIMAT) 2.5 MCG/ACT AERS Inhale 2 puffs into the lungs daily. 3 Inhaler 3   No current facility-administered medications on file prior to visit.    No Known Allergies Family History  Adopted: Yes    PE: BP 130/80   Pulse 72   Ht 6' 1"  (1.854 m)   Wt 274 lb  (124.3 kg)   SpO2 95%   BMI 36.15 kg/m  Wt Readings from Last 3 Encounters:  09/08/18 274 lb (124.3 kg)  05/10/18 260 lb 12.8 oz (118.3 kg)  03/09/18 261 lb 12.8 oz (118.8 kg)   Constitutional: overweight, in NAD Eyes: PERRLA, EOMI, no exophthalmos ENT: moist mucous membranes, thyroidectomy scar healed, no neck masses palpated, no cervical lymphadenopathy Cardiovascular: RRR, No MRG Respiratory: CTA B Gastrointestinal: abdomen soft, NT, ND, BS+ Musculoskeletal: no deformities, strength intact in all 4 Skin: moist, warm, no rashes Neurological: no tremor with outstretched hands, DTR normal in all 4  ASSESSMENT: 1. Thyroid cancer - see HPI  2. Postsurgical Hypothyroidism  PLAN:  1. Papillary Thyroid Cancer -Patient with history of classic type of papillary thyroid cancer, not very large, encapsulated, without extrathyroidal extension, without lymphovascular invasion and with negative lymph node biopsies.  Therefore, his cancer is considered low risk.  We decided to follow him without RAI treatment per the 2015 ATA thyroid cancer guidelines.  We are following him with thyroglobulin as a  tumor marker and also with neck ultrasounds.  We discussed at last visits that even if he ends up needing RAI treatment as salvage therapy in the future, his prognosis will not be changed. -We reviewed his latest thyroglobulin level from 6 months ago, which was undetectable, as were his antithyroglobulin antibodies.  We will repeat these today -Since he is a year after his surgery, we will check a neck ultrasound, also -I will see him back in 6 months  2. Patient with history of total thyroidectomy for thyroid cancer, now with iatrogenic hypothyroidism, on levothyroxine therapy.  At this visit, he complains of severe fatigue and sleeping most of the day. - latest thyroid labs reviewed with pt >> normal at last visit in 02/2018 - he continues on LT4 d.a.w. 125 mcg daily - we discussed about taking the  thyroid hormone every day, with water, >30 minutes before breakfast, separated by >4 hours from acid reflux medications, calcium, iron, multivitamins. Pt. is taking it correctly. - will check thyroid tests today: TSH and fT4 - If labs are abnormal, he will need to return for repeat TFTs in 1.5 months  Patient Instructions  Please continue Synthroid 125 mcg daily  Take the thyroid hormone every day, with water, at least 30 minutes before breakfast, separated by at least 4 hours from: - acid reflux medications - calcium - iron - multivitamins  Please come back for a follow-up appointment in 6 months.  Orders Placed This Encounter  Procedures  . US Soft Tissue Head/Neck  . Flu Vaccine QUAD 36+ mos IM  . TSH  . T4, free  . Thyroglobulin Level  . Thyroglobulin antibody   Office Visit on 09/08/2018  Component Date Value Ref Range Status  . TSH 09/08/2018 4.66* 0.35 - 4.50 uIU/mL Final  . Free T4 09/08/2018 0.73  0.60 - 1.60 ng/dL Final   Comment: Specimens from patients who are undergoing biotin therapy and /or ingesting biotin supplements may contain high levels of biotin.  The higher biotin concentration in these specimens interferes with this Free T4 assay.  Specimens that contain high levels  of biotin may cause false high results for this Free T4 assay.  Please interpret results in light of the total clinical presentation of the patient.    . Thyroglobulin 09/08/2018 0.2* ng/mL Final   Comment:       Reference Range:       Intact Thyroid   2.8-40.9       Athyrotic        <0.1 .       Note: Abnormal flagging is based       on the reference interval for        patients with intact thyroid. . . This test was performed using the Beck[employer] Coulter  chemiluminescent method. Values obtained from different assay methods cannot be used interchangeably. Thyroglobulin levels, regardless of value, should not be interpreted as absolute evidence of the presence or absence of  disease. .   . Comment 09/08/2018    Final   Comment: . Thyroglobulin antibodies (TGAB) interfere with thyroglobulin (TG) assays; therefore, TGAB assay should always be performed in conjunction with a TG assay. .   . Thyroglobulin Ab 09/08/2018 <1  < or = 1 IU/mL Final   TSH slightly high so we increase his levothyroxine dose to 137 mcg daily. Thyroglobulin detectable, slightly higher than before.  We will recheck at next visit for trend.  Philemon Kingdom, MD PhD [facility]

## 2018-09-09 ENCOUNTER — Encounter: Payer: Self-pay | Admitting: Internal Medicine

## 2018-09-09 ENCOUNTER — Other Ambulatory Visit: Payer: Self-pay | Admitting: Internal Medicine

## 2018-09-09 DIAGNOSIS — E890 Postprocedural hypothyroidism: Secondary | ICD-10-CM

## 2018-09-09 DIAGNOSIS — C73 Malignant neoplasm of thyroid gland: Secondary | ICD-10-CM

## 2018-09-09 LAB — THYROGLOBULIN LEVEL: Thyroglobulin: 0.2 ng/mL — ABNORMAL LOW

## 2018-09-09 LAB — THYROGLOBULIN ANTIBODY: Thyroglobulin Ab: <1 IU/mL (ref <=1)

## 2018-09-09 MED ORDER — LEVOTHYROXINE SODIUM 137 MCG PO TABS: 137.0000 ug | ORAL_TABLET | Freq: Every day | ORAL | 3 refills | Status: DC

## 2018-09-21 DIAGNOSIS — H10022 Other mucopurulent conjunctivitis, left eye: Secondary | ICD-10-CM | POA: Diagnosis not present

## 2018-10-05 DIAGNOSIS — H10022 Other mucopurulent conjunctivitis, left eye: Secondary | ICD-10-CM | POA: Diagnosis not present

## 2018-10-05 DIAGNOSIS — H02051 Trichiasis without entropian right upper eyelid: Secondary | ICD-10-CM | POA: Diagnosis not present

## 2018-10-05 DIAGNOSIS — H02054 Trichiasis without entropian left upper eyelid: Secondary | ICD-10-CM | POA: Diagnosis not present

## 2018-10-12 ENCOUNTER — Other Ambulatory Visit: Payer: Self-pay

## 2018-10-12 MED ORDER — LEVOTHYROXINE SODIUM 137 MCG PO TABS: 137.0000 ug | ORAL_TABLET | Freq: Every day | ORAL | 3 refills | Status: DC

## 2019-03-13 ENCOUNTER — Encounter: Payer: Self-pay | Admitting: Internal Medicine

## 2019-03-13 ENCOUNTER — Ambulatory Visit (INDEPENDENT_AMBULATORY_CARE_PROVIDER_SITE_OTHER): Payer: Medicare HMO | Admitting: Internal Medicine

## 2019-03-13 ENCOUNTER — Other Ambulatory Visit: Payer: Self-pay

## 2019-03-13 DIAGNOSIS — C73 Malignant neoplasm of thyroid gland: Secondary | ICD-10-CM

## 2019-03-13 DIAGNOSIS — E890 Postprocedural hypothyroidism: Secondary | ICD-10-CM | POA: Diagnosis not present

## 2019-03-13 NOTE — Progress Notes (Signed)
Patient ID: Timothy Conway, male   DOB: 1956-03-03, 63 y.o.   MRN: [id number]   Patient location: Home My location: Office  Referring Provider: Dr. Harlow Asa  I connected with the patient on 03/13/19 at 9:50 AM EDT by telephone and verified that I am speaking with the correct person.   I discussed the limitations of evaluation and management by telephone and the availability of in person appointments. The patient expressed understanding and agreed to proceed.   Details of the encounter are shown below.  HPI  Timothy Conway is a 63 y.o.-year-old male, initially referred by Dr. Harlow Asa, presenting for follow-up for papillary thyroid cancer and postsurgical hypothyroidism.  Last visit 6 months ago.  At last visit, he was describing extreme fatigue and having to stay in bed most of the day.  He also gained 14 pounds before last visit. He is feeling better now.  Reviewed his cancer history: Pt. has been found to have a hypermetabolic thyroid nodule on a PET scan (07/13/2017) obtained by Dr. Lamonte Sakai, his pulmonologist, to investigate a lung nodule.  A thyroid U/S (07/30/2017) showed a L lobe 1.3 x 1.0 x 1.2 cm nodule, solid, hypoechoic, lobulated/irregular, with peripheral calcifications. Bx of this nodule (07/30/2017): PTC  Total thyroidectomy (08/27/2017) by Dr. Harlow Asa: Diagnosis 1. Thyroid, thyroidectomy, total - PAPILLARY THYROID CARCINOMA, CLASSIC TYPE, SPANNING 1.4 CM. - NO EXTRATHYROIDAL EXTENSION. - RESECTION MARGINS ARE NEGATIVE. - SEE ONCOLOGY TABLE. 2. Lymph node, biopsy, central compartment - THREE OF THREE LYMPH NODES NEGATIVE FOR CARCINOMA (0/3). - ONE BENIGN PARATHYROID. Microscopic Comment 1. THYROID Specimen: Total thyroid with central compartment lymph nodes. Procedure (including lymph node sampling if applicable): Total thyroidectomy with central lymph node biopsy. Specimen Integrity (intact/fragmented): Intact. Tumor focality: Unifocal. Dominant tumor: Maximum  tumor size (cm): 1.4 cm. Tumor laterality: Left. Histologic type (including subtype and/or unique features as applicable): Papillary thyroid carcinoma, classic type. Tumor capsule: Present. Extrathyroidal extension: Absent. Capsular invasion with degree of invasion if present: N/A. Margins: Negative. Lymphatic or vascular invasion: Not identified. Lymph nodes: # examined 3; # positive; 0 Extracapsular extension (if applicable): N/A. TNM code: pT1b, pN0 Non-neoplastic thyroid: Largely unremarkable. Comments: There tumor is largely fibrotic and calcified with only a small portion of viable tumor. Vicente Males MD Pathologist, Electronic Signature (Case signed 08/30/2017)  Postsurgical hypothyroidism  Pt is on LT4 (generic) 137 Mcg daily (increased from 125 at last visit), taken: - in am (4-7 am) - fasting - at least 30 min from coffee + milk, skips b'fast - no Ca, Fe - + MVI, PPIs at night - not on Biotin  Review latest TFTs: Lab Results  Component Value Date   TSH 4.66 (H) 09/08/2018   TSH 0.52 03/23/2018   TSH 12.75 (H) 02/11/2018   TSH 2.01 11/12/2017   TSH 3.67 10/01/2017   FREET4 0.73 09/08/2018   FREET4 0.87 03/23/2018   FREET4 0.62 02/11/2018   FREET4 0.70 11/12/2017   FREET4 0.77 10/01/2017    Tg was detectable: Lab Results  Component Value Date   THYROGLB 0.2 (L) 09/08/2018   THYROGLB 0.1 (L) 03/23/2018   Lab Results  Component Value Date   THGAB <1 09/08/2018   THGAB <1 03/23/2018   Pt denies: - feeling nodules in neck - hoarseness - dysphagia - choking - SOB with lying down  It is unclear if he had family history of thyroid disease or thyroid cancer as he was adopted. No h/o radiation tx to head or neck. No herbal supplements.  No Biotin use. No recent steroids use.   He continues to smoke.  ROS: Constitutional: no weight gain/no weight loss, + fatigue, no subjective hyperthermia, no subjective hypothermia Eyes: no blurry vision, no  xerophthalmia ENT: no sore throat, + see HPI Cardiovascular: no CP/no SOB/no palpitations/no leg swelling Respiratory: no cough/no SOB/no wheezing Gastrointestinal: no N/no V/+ D/no C/no acid reflux Musculoskeletal: no muscle aches/no joint aches Skin: no rashes, no hair loss Neurological: no tremors/no numbness/no tingling/no dizziness  I reviewed pt's medications, allergies, PMH, social hx, family hx, and changes were documented in the history of present illness. Otherwise, unchanged from my initial visit note.  Past Medical History:  Diagnosis Date  . Anxiety   . COPD (chronic obstructive pulmonary disease) ([place])   . Depression   . Dysrhythmia    hx of PAC's  . Gout   . Hyperlipidemia   . Hypertension   . Thyroid cancer ([place])    2018   Past Surgical History:  Procedure Laterality Date  . ABDOMINAL SURGERY     hemmoraging ulcer  . CATARACT EXTRACTION Right   . FRACTURE SURGERY     Left arm  . THYROIDECTOMY N/A 08/27/2017   Procedure: TOTAL THYROIDECTOMY WITH LIMITED LYMPH NODE DISSECTION;  Surgeon: Armandina Gemma, MD;  Location: [facility];  Service: General;  Laterality: N/A;   Social History   Social History  . Marital status: Single    Spouse name: N/A  . Number of children: 0   Occupational History  . n/a   Social History Main Topics  . Smoking status: Current Every Day Smoker    Packs/day: 1.00    Years: 40.00    Types: Cigarettes  . Smokeless tobacco: Never Used     Comment: 1 1/2 per day  . Alcohol use 0.6 oz/week    3-4 Cans of beer per day  . Drug use: No   Current Outpatient Medications on File Prior to Visit  Medication Sig Dispense Refill  . ALPRAZolam (XANAX) 1 MG tablet Take 1 mg by mouth 3 (three) times daily as needed for anxiety.     . ARIPiprazole (ABILIFY) 5 MG tablet Take 5 mg by mouth at bedtime.     Marland Kitchen atenolol (TENORMIN) 100 MG tablet Take 100 mg by mouth 2 (two) times daily.    . Cholecalciferol (VITAMIN D) 2000 units tablet Take 2,000  Units by mouth daily.    . FENOFIBRATE PO Take by mouth.    . fluticasone (FLONASE) 50 MCG/ACT nasal spray Place 2 sprays into both nostrils daily as needed for allergies or rhinitis.    Marland Kitchen levothyroxine (SYNTHROID, LEVOTHROID) 137 MCG tablet Take 1 tablet (137 mcg total) by mouth daily. 90 tablet 3  . Multiple Vitamin (MULTIVITAMIN WITH MINERALS) TABS tablet Take 1 tablet by mouth daily.    . Rosuvastatin Calcium (CRESTOR PO) Take by mouth.    . Tiotropium Bromide Monohydrate (SPIRIVA RESPIMAT) 2.5 MCG/ACT AERS Inhale 2 puffs into the lungs daily. 3 Inhaler 3   No current facility-administered medications on file prior to visit.    No Known Allergies Family History  Adopted: Yes    PE: There were no vitals taken for this visit. Wt Readings from Last 3 Encounters:  09/08/18 274 lb (124.3 kg)  05/10/18 260 lb 12.8 oz (118.3 kg)  03/09/18 261 lb 12.8 oz (118.8 kg)   Constitutional:  in NAD  The physical exam was not performed (phone visit).  ASSESSMENT: 1. Thyroid cancer - see HPI  2. Postsurgical Hypothyroidism  PLAN:  1. Papillary Thyroid Cancer -Patient with history of classic type of papillary thyroid cancer, not very large, encapsulated, without extrathyroidal extension, without lymphovascular invasion and with negative lymph node biopsies.  Therefore, his cancer was considered low risk and we decided to continue without RAI treatment per the 2015 ATA thyroid cancer guidelines.  We are following him with thyroglobulin as a tumor marker and also with neck ultrasounds.  We discussed in the past that even if he ends up needing RAI treatment as salvage therapy in the future, his prognosis will not be changed. -At last visit, we discussed about performing a thyroid ultrasound but he did not have this yet as he was not called to schedule this.  Due to the coronavirus pandemic, we will hold off performing one for now, but we will check one at next visit. -We reviewed his latest  thyroglobulin level from last visit and this was 0.2, detectable, which is not unusual in the absence of RAI treatment.  We again discussed that the trend rather than the absolute value is most important when looking at the thyroglobulin. -We will check this again at next lab draw -I will see him back in 6-8 months  2. Patient history of total thyroidectomy for thyroid cancer, now with iatrogenic hypothyroidism, on levothyroxine therapy.  At last visit, he complained of severe fatigue and sleeping most of the day.  His TSH was only slightly elevated and we increased his levothyroxine dose at that time. - Unfortunately, he did not return for repeat set of TFTs after the change in dose - he continues on LT4 137 mcg daily - pt feels much better on this dose, with improved fatigue.  In fact, he has no complaints at this visit - we discussed about taking the thyroid hormone every day, with water, >30 minutes before breakfast, separated by >4 hours from acid reflux medications, calcium, iron, multivitamins. Pt. is taking it correctly. - will check thyroid tests when safe to return to the clinic: TSH and fT4 - If labs are abnormal, he will need to return for repeat TFTs in 1.5 months  Patient Instructions  Please continue Synthroid 137 mcg daily  Take the thyroid hormone every day, with water, at least 30 minutes before breakfast, separated by at least 4 hours from: - acid reflux medications - calcium - iron - multivitamins  Please come back for a follow-up appointment in 6 months.  Orders Placed This Encounter  Procedures  . TSH  . T4, free  . Thyroglobulin Level  . Thyroglobulin antibody   - time spent with the patient: 11 min, of which >50% was spent in obtaining information about his symptoms, reviewing his previous labs, evaluations, and treatments, counseling him about his conditions (please see the discussed topics above), and developing a plan to further investigate and treat  them.  Philemon Kingdom, MD PhD [facility] Endocrinology

## 2019-03-13 NOTE — Patient Instructions (Signed)
Please continue Synthroid 137 mcg daily  Take the thyroid hormone every day, with water, at least 30 minutes before breakfast, separated by at least 4 hours from: - acid reflux medications - calcium - iron - multivitamins  Please come back for a follow-up appointment in 6 months.

## 2019-04-25 DIAGNOSIS — Z79899 Other long term (current) drug therapy: Secondary | ICD-10-CM | POA: Diagnosis not present

## 2019-04-25 DIAGNOSIS — E559 Vitamin D deficiency, unspecified: Secondary | ICD-10-CM | POA: Diagnosis not present

## 2019-04-25 DIAGNOSIS — Z0001 Encounter for general adult medical examination with abnormal findings: Secondary | ICD-10-CM | POA: Diagnosis not present

## 2019-04-25 DIAGNOSIS — E781 Pure hyperglyceridemia: Secondary | ICD-10-CM | POA: Diagnosis not present

## 2019-04-25 DIAGNOSIS — Z125 Encounter for screening for malignant neoplasm of prostate: Secondary | ICD-10-CM | POA: Diagnosis not present

## 2019-04-25 DIAGNOSIS — E7800 Pure hypercholesterolemia, unspecified: Secondary | ICD-10-CM | POA: Diagnosis not present

## 2019-04-26 DIAGNOSIS — J449 Chronic obstructive pulmonary disease, unspecified: Secondary | ICD-10-CM | POA: Diagnosis not present

## 2019-04-26 DIAGNOSIS — F321 Major depressive disorder, single episode, moderate: Secondary | ICD-10-CM | POA: Diagnosis not present

## 2019-04-26 DIAGNOSIS — E7800 Pure hypercholesterolemia, unspecified: Secondary | ICD-10-CM | POA: Diagnosis not present

## 2019-04-26 DIAGNOSIS — I10 Essential (primary) hypertension: Secondary | ICD-10-CM | POA: Diagnosis not present

## 2019-04-26 DIAGNOSIS — F419 Anxiety disorder, unspecified: Secondary | ICD-10-CM | POA: Diagnosis not present

## 2019-04-26 DIAGNOSIS — Z0001 Encounter for general adult medical examination with abnormal findings: Secondary | ICD-10-CM | POA: Diagnosis not present

## 2019-04-26 DIAGNOSIS — R911 Solitary pulmonary nodule: Secondary | ICD-10-CM | POA: Diagnosis not present

## 2019-04-26 DIAGNOSIS — M62830 Muscle spasm of back: Secondary | ICD-10-CM | POA: Diagnosis not present

## 2019-04-26 DIAGNOSIS — I719 Aortic aneurysm of unspecified site, without rupture: Secondary | ICD-10-CM | POA: Diagnosis not present

## 2019-05-30 DIAGNOSIS — H2513 Age-related nuclear cataract, bilateral: Secondary | ICD-10-CM | POA: Diagnosis not present

## 2019-09-08 ENCOUNTER — Other Ambulatory Visit: Payer: Self-pay

## 2019-09-12 ENCOUNTER — Ambulatory Visit: Payer: Medicare HMO | Admitting: Internal Medicine

## 2019-09-14 ENCOUNTER — Other Ambulatory Visit: Payer: Self-pay

## 2019-09-14 ENCOUNTER — Ambulatory Visit (INDEPENDENT_AMBULATORY_CARE_PROVIDER_SITE_OTHER): Payer: Medicare HMO | Admitting: Internal Medicine

## 2019-09-14 ENCOUNTER — Encounter: Payer: Self-pay | Admitting: Internal Medicine

## 2019-09-14 VITALS — BP 122/78 | HR 62 | Temp 97.2°F | Ht 73.5 in | Wt 275.0 lb

## 2019-09-14 DIAGNOSIS — E890 Postprocedural hypothyroidism: Secondary | ICD-10-CM

## 2019-09-14 DIAGNOSIS — C73 Malignant neoplasm of thyroid gland: Secondary | ICD-10-CM | POA: Diagnosis not present

## 2019-09-14 DIAGNOSIS — R5382 Chronic fatigue, unspecified: Secondary | ICD-10-CM | POA: Diagnosis not present

## 2019-09-14 LAB — TSH: TSH: 0.64 u[IU]/mL (ref 0.35–4.50)

## 2019-09-14 LAB — T4, FREE: Free T4: 1.04 ng/dL (ref 0.60–1.60)

## 2019-09-14 NOTE — Progress Notes (Addendum)
Patient ID: Timothy Conway, male   DOB: [birth year], 63 y.o.   MRN: [id number]   HPI  Timothy Conway is a 63 y.o.-year-old male, initially referred by Dr. Harlow Asa, presenting for follow-up for papillary thyroid cancer and postsurgical hypothyroidism.  Last visit 6 months ago (virtual).  Reviewed his cancer history: Pt. has been found to have a hypermetabolic thyroid nodule on a PET scan (07/13/2017) obtained by Dr. Lamonte Sakai, his pulmonologist, to investigate a lung nodule.  A thyroid U/S (07/30/2017) showed a L lobe 1.3 x 1.0 x 1.2 cm nodule, solid, hypoechoic, lobulated/irregular, with peripheral calcifications. Bx of this nodule (07/30/2017): PTC  Total thyroidectomy (08/27/2017) by Dr. Harlow Asa: Diagnosis 1. Thyroid, thyroidectomy, total - PAPILLARY THYROID CARCINOMA, CLASSIC TYPE, SPANNING 1.4 CM. - NO EXTRATHYROIDAL EXTENSION. - RESECTION MARGINS ARE NEGATIVE. - SEE ONCOLOGY TABLE. 2. Lymph node, biopsy, central compartment - THREE OF THREE LYMPH NODES NEGATIVE FOR CARCINOMA (0/3). - ONE BENIGN PARATHYROID. Microscopic Comment 1. THYROID Specimen: Total thyroid with central compartment lymph nodes. Procedure (including lymph node sampling if applicable): Total thyroidectomy with central lymph node biopsy. Specimen Integrity (intact/fragmented): Intact. Tumor focality: Unifocal. Dominant tumor: Maximum tumor size (cm): 1.4 cm. Tumor laterality: Left. Histologic type (including subtype and/or unique features as applicable): Papillary thyroid carcinoma, classic type. Tumor capsule: Present. Extrathyroidal extension: Absent. Capsular invasion with degree of invasion if present: N/A. Margins: Negative. Lymphatic or vascular invasion: Not identified. Lymph nodes: # examined 3; # positive; 0 Extracapsular extension (if applicable): N/A. TNM code: pT1b, pN0 Non-neoplastic thyroid: Largely unremarkable. Comments: There tumor is largely fibrotic and calcified with only a small portion  of viable tumor. Vicente Males MD Pathologist, Electronic Signature (Case signed 08/30/2017)  Postsurgical hypothyroidism  Patient is on Synthroid 137 mcg daily-increased from 125 mcg daily in 08/2018. He takes this: - in am - fasting - at least 30 min from coffee and milk, skips breakfast - no Ca, Fe - + MVI, PPIs at night - not on Biotin  Reviewed his TFTs: Lab Results  Component Value Date   TSH 4.66 (H) 09/08/2018   TSH 0.52 03/23/2018   TSH 12.75 (H) 02/11/2018   TSH 2.01 11/12/2017   TSH 3.67 10/01/2017   FREET4 0.73 09/08/2018   FREET4 0.87 03/23/2018   FREET4 0.62 02/11/2018   FREET4 0.70 11/12/2017   FREET4 0.77 10/01/2017    Tg was detectable: Lab Results  Component Value Date   THYROGLB 0.2 (L) 09/08/2018   THYROGLB 0.1 (L) 03/23/2018   Lab Results  Component Value Date   THGAB <1 09/08/2018   THGAB <1 03/23/2018   Pt denies: - feeling nodules in neck - hoarseness - dysphagia - choking - SOB with lying down  Unknown family history of thyroid disease or thyroid cancer since he was adopted.  No h/o radiation tx to head or neck.  No herbal supplements. No Biotin use. No recent steroids use.   ROS: Constitutional: + weight gain (but not based on our scale)/no weight loss, + fatigue, no subjective hyperthermia, no subjective hypothermia Eyes: no blurry vision, no xerophthalmia ENT: no sore throat, + see HPI Cardiovascular: no CP/no SOB/no palpitations/no leg swelling Respiratory: no cough/no SOB/no wheezing Gastrointestinal: no N/no V/no D/no C/no acid reflux Musculoskeletal: no muscle aches/no joint aches Skin: no rashes, no hair loss Neurological: no tremors/no numbness/no tingling/no dizziness  I reviewed pt's medications, allergies, PMH, social hx, family hx, and changes were documented in the history of present illness. Otherwise, unchanged from my initial visit  note.  Past Medical History:  Diagnosis Date  . Anxiety   . COPD (chronic  obstructive pulmonary disease) ([place])   . Depression   . Dysrhythmia    hx of PAC's  . Gout   . Hyperlipidemia   . Hypertension   . Thyroid cancer ([place])    2018   Past Surgical History:  Procedure Laterality Date  . ABDOMINAL SURGERY     hemmoraging ulcer  . CATARACT EXTRACTION Right   . FRACTURE SURGERY     Left arm  . THYROIDECTOMY N/A 08/27/2017   Procedure: TOTAL THYROIDECTOMY WITH LIMITED LYMPH NODE DISSECTION;  Surgeon: Armandina Gemma, MD;  Location: [facility];  Service: General;  Laterality: N/A;   Social History   Social History  . Marital status: Single    Spouse name: N/A  . Number of children: 0   Occupational History  . n/a   Social History Main Topics  . Smoking status: Current Every Day Smoker    Packs/day: 1.00    Years: 40.00    Types: Cigarettes  . Smokeless tobacco: Never Used     Comment: 1 1/2 per day  . Alcohol use 0.6 oz/week    3-4 Cans of beer per day  . Drug use: No   Current Outpatient Medications on File Prior to Visit  Medication Sig Dispense Refill  . ALPRAZolam (XANAX) 1 MG tablet Take 1 mg by mouth 3 (three) times daily as needed for anxiety.     . ARIPiprazole (ABILIFY) 5 MG tablet Take 5 mg by mouth at bedtime.     Marland Kitchen atenolol (TENORMIN) 100 MG tablet Take 100 mg by mouth 2 (two) times daily.    . Cholecalciferol (VITAMIN D) 2000 units tablet Take 2,000 Units by mouth daily.    . fluticasone (FLONASE) 50 MCG/ACT nasal spray Place 2 sprays into both nostrils daily as needed for allergies or rhinitis.    Marland Kitchen levothyroxine (SYNTHROID, LEVOTHROID) 137 MCG tablet Take 1 tablet (137 mcg total) by mouth daily. 90 tablet 3  . Multiple Vitamin (MULTIVITAMIN WITH MINERALS) TABS tablet Take 1 tablet by mouth daily.    Marland Kitchen omeprazole (PRILOSEC) 20 MG capsule Take 20 mg by mouth daily.    . Rosuvastatin Calcium (CRESTOR PO) Take 10 mg by mouth daily.    . Tiotropium Bromide Monohydrate (SPIRIVA RESPIMAT) 2.5 MCG/ACT AERS Inhale 2 puffs into the lungs daily.  3 Inhaler 3   No current facility-administered medications on file prior to visit.    No Known Allergies Family History  Adopted: Yes    PE: BP 122/78   Pulse 62   Temp (!) 97.2 F (36.2 C) (Temporal)   Ht 6' 1.5" (1.867 m)   Wt 275 lb (124.7 kg)   SpO2 96%   BMI 35.79 kg/m  Wt Readings from Last 3 Encounters:  09/14/19 275 lb (124.7 kg)  09/08/18 274 lb (124.3 kg)  05/10/18 260 lb 12.8 oz (118.3 kg)   Constitutional: overweight, in NAD Eyes: PERRLA, EOMI, no exophthalmos ENT: moist mucous membranes, no neck masses palpated, thyroidectomy scar healed, no cervical lymphadenopathy Cardiovascular: RRR, No MRG Respiratory: CTA B Gastrointestinal: abdomen soft, NT, ND, BS+ Musculoskeletal: no deformities, strength intact in all 4 Skin: moist, warm, no rashes Neurological: no tremor with outstretched hands, DTR normal in all 4   ASSESSMENT: 1. Thyroid cancer - see HPI  2. Postsurgical Hypothyroidism  3. Fatigue  PLAN:  1. Papillary Thyroid Cancer -Patient with history of classic type of papillary  thyroid cancer, not very large, encapsulated, without extrathyroidal extension, without lymphovascular invasion with negative lymph node biopsies.  Therefore, his cancer was considered low risk and we decided to continue without RAI treatment for the 2015 ATA thyroid cancer guidelines.  We are following him with thyroglobulin as a tumor marker and also with neck ultrasounds. We discussed in the past that even if he ends up needing RAI treatment as salvage therapy in the future, his prognosis will not be changed. -We will perform a neck ultrasound now.  We could not do this earlier in the year due to the coronavirus pandemic. -We reviewed his latest thyroglobulin level from the end of last year, and this was 0.2, undetectable, which is not unusual in the absence of RAI treatment.  However, we discussed that we need to follow the trends rather than the absolute values. -We will check  these again  -I will see him back in 1 year  2. Patient with history of total thyroidectomy for thyroid cancer, now with iatrogenic hypothyroidism, on Synthroid.  He had fatigue in the past, which improved with increasing his dose of Synthroid. - latest thyroid labs reviewed with pt >> TSH was slightly increased in 08/2018, after which we increased the Synthroid.  However, our last visit was virtual, so we could not check labs then to see if the dose was adequate. - he continues on Synthroid d.a.w. 137 mcg daily - pt feels good on this dose. - we discussed about taking the thyroid hormone every day, with water, >30 minutes before breakfast, separated by >4 hours from acid reflux medications, calcium, iron, multivitamins. Pt. is taking it correctly. - will check thyroid tests today: TSH and fT4 - If labs are abnormal, he will need to return for repeat TFTs in 1.5 months  3.  Fatigue -He still describes fatigue and this visit, despite decreasing the Synthroid dose at last visit. -We discussed that if the tests are normal today, his fatigue may be related to other factors, for example sleep apnea.  Upon questioning, he is waking up gasping for air in the middle of the night.  We discussed about talking with his PCP to see if he can be referred for a sleep study.  He agrees to do so.  Patient Instructions  Please continue Synthroid 137 mcg daily  Take the thyroid hormone every day, with water, at least 30 minutes before breakfast, separated by at least 4 hours from: - acid reflux medications - calcium - iron - multivitamins  Please come back for a follow-up appointment in 1 year.  Orders Placed This Encounter  Procedures  . US THYROID  . TSH  . T4, free  . Thyroglobulin antibody  . Thyroglobulin Level   Component     Latest Ref Rng & Units 09/14/2019  Thyroglobulin     ng/mL 0.1 (L)  Comment        TSH     0.35 - 4.50 uIU/mL 0.64  T4,Free(Direct)     0.60 - 1.60 ng/dL 1.04   Thyroglobulin Ab     < or = 1 IU/mL <1   Tg still detectable, but slightly lower.  TFTs at goal. Neck U/S pending. I will addend the results when they become available.  US THYROID CLINICAL DATA:  63 year old male with a history of papillary thyroid cancer status post total thyroidectomy and radioactive iodine ablation.  EXAM: THYROID ULTRASOUND  TECHNIQUE: Ultrasound examination of the thyroid gland and adjacent soft tissues was performed.  COMPARISON:  Prior thyroid ultrasound 07/30/2017  FINDINGS: The thyroid gland is surgically absent. Evaluation of the left and right resection bed demonstrates no evidence of residual thyroid tissue or nodularity. No suspicious lymphadenopathy is identified.  IMPRESSION: Surgical changes of prior total thyroidectomy without evidence of residual or recurrent thyroid tissue, nodularity or lymphadenopathy.  Electronically Signed   By: Jacqulynn Cadet M.D.   On: 09/25/2019 13:05   Philemon Kingdom, MD PhD [facility] Endocrinology

## 2019-09-14 NOTE — Patient Instructions (Addendum)
Please continue Synthroid 137 mcg daily  Take the thyroid hormone every day, with water, at least 30 minutes before breakfast, separated by at least 4 hours from: - acid reflux medications - calcium - iron - multivitamins  Please let me know if you are not called for scheduling the neck U/S within 1 week.  Please discuss with PCP about a referral to a sleep study.  Please come back for a follow-up appointment in 1 year.

## 2019-09-15 LAB — THYROGLOBULIN LEVEL: Thyroglobulin: 0.1 ng/mL — ABNORMAL LOW

## 2019-09-15 LAB — THYROGLOBULIN ANTIBODY: Thyroglobulin Ab: <1 IU/mL (ref <=1)

## 2019-09-25 ENCOUNTER — Ambulatory Visit
Admission: RE | Admit: 2019-09-25 | Discharge: 2019-09-25 | Disposition: A | Discharge status: Home / Self Care | Payer: Medicare HMO | Source: Ambulatory Visit | Attending: Internal Medicine | Admitting: Internal Medicine

## 2019-09-25 DIAGNOSIS — Z85850 Personal history of malignant neoplasm of thyroid: Secondary | ICD-10-CM | POA: Diagnosis not present

## 2019-09-25 IMAGING — US US THYROID
1 series · 14 of 18 positions shown · non-contrast
Comparison: Prior thyroid ultrasound [DATE]

CLINICAL DATA: 63-year-old male with a history of papillary thyroid
cancer status post total thyroidectomy and radioactive iodine
ablation.

EXAM:
THYROID ULTRASOUND
TECHNIQUE: Ultrasound examination of the thyroid gland and adjacent soft
tissues was performed.

[Series 1: us thyroid · 0.09mm/px · 14 of 18 slices shown]
[im 1/18]
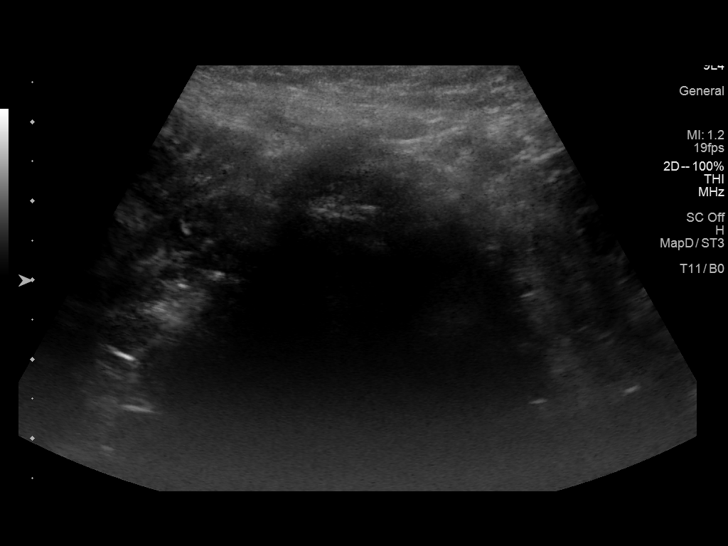
[im 2/18]
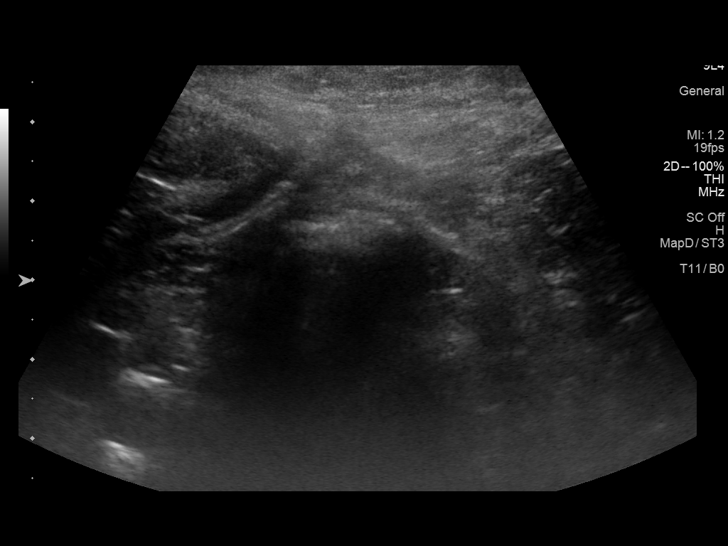
[im 4/18]
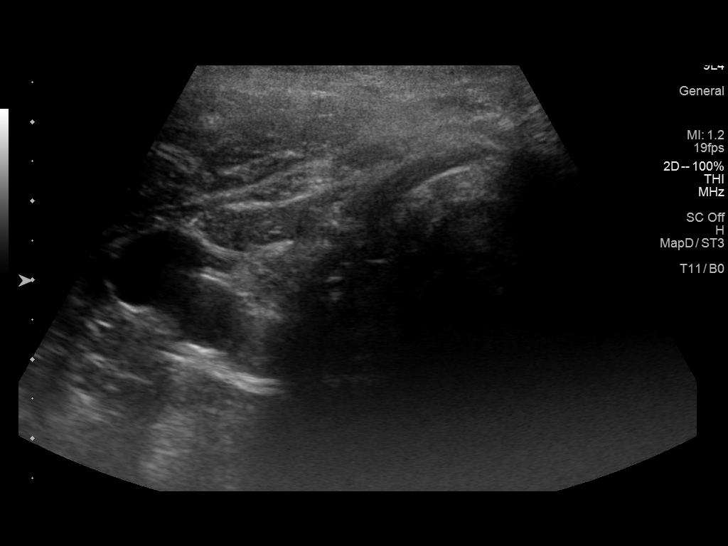
[im 5/18]
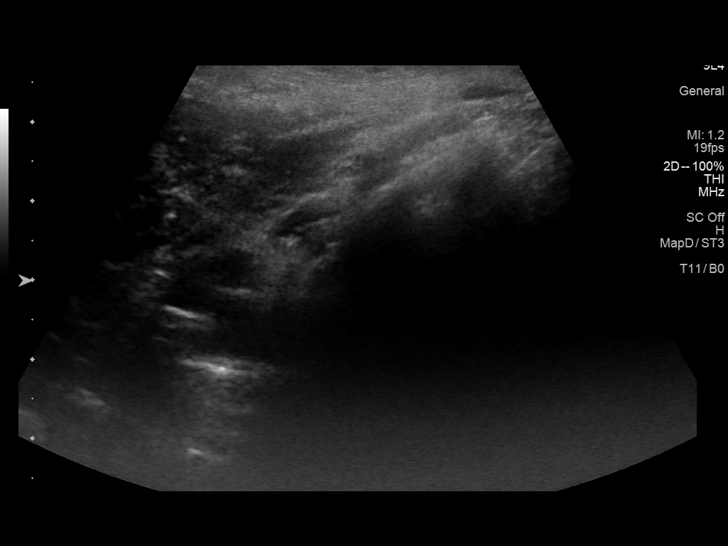
[im 6/18]
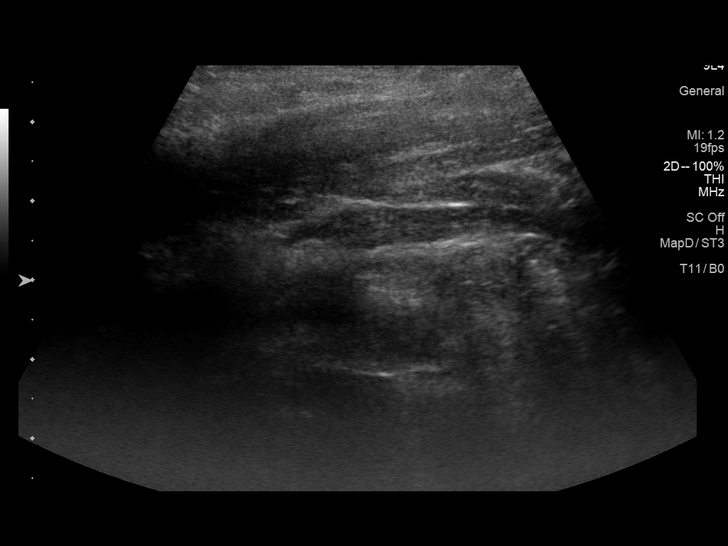
[im 8/18]
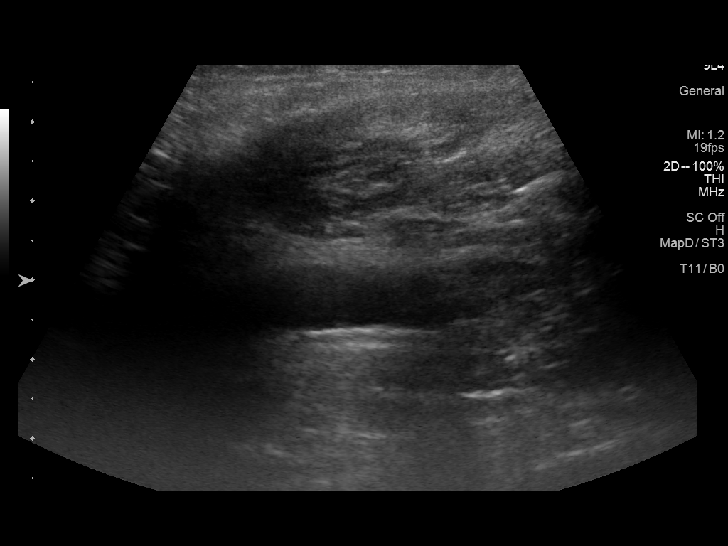
[im 9/18]
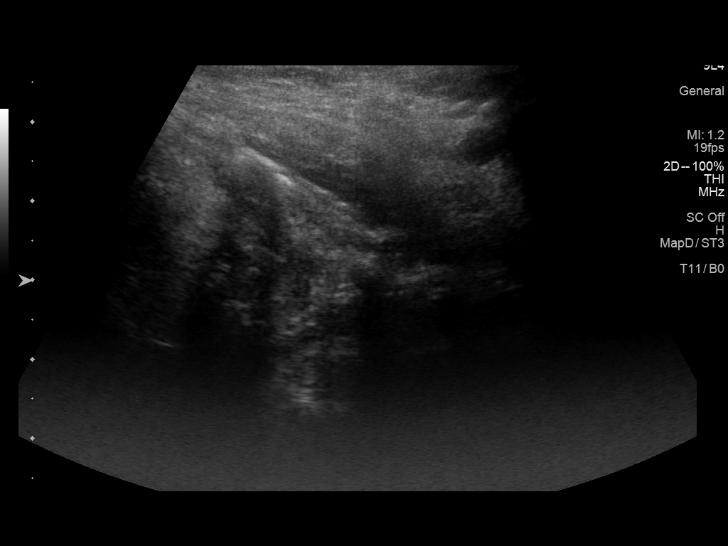
[im 10/18]
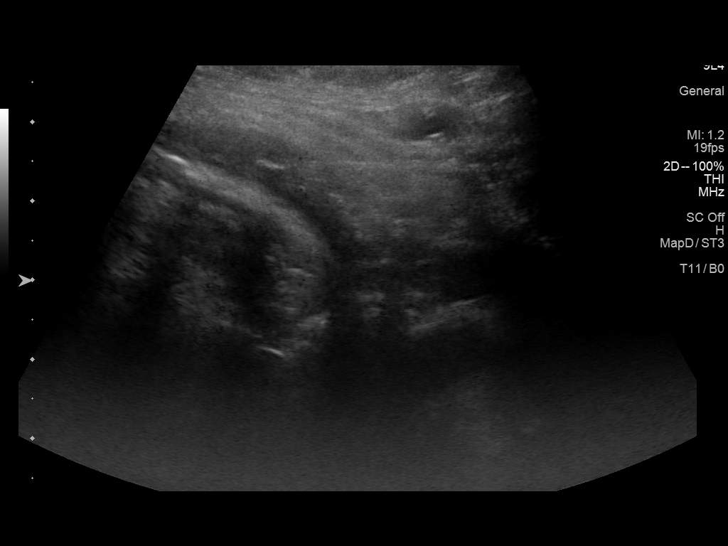
[im 11/18]
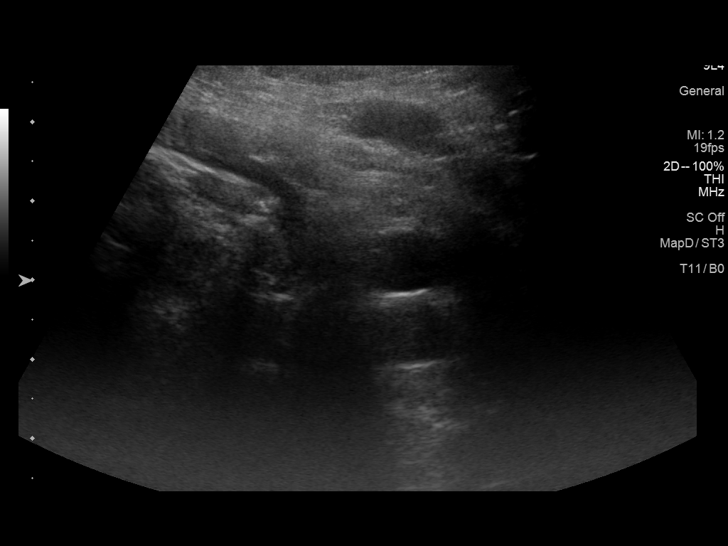
[im 13/18]
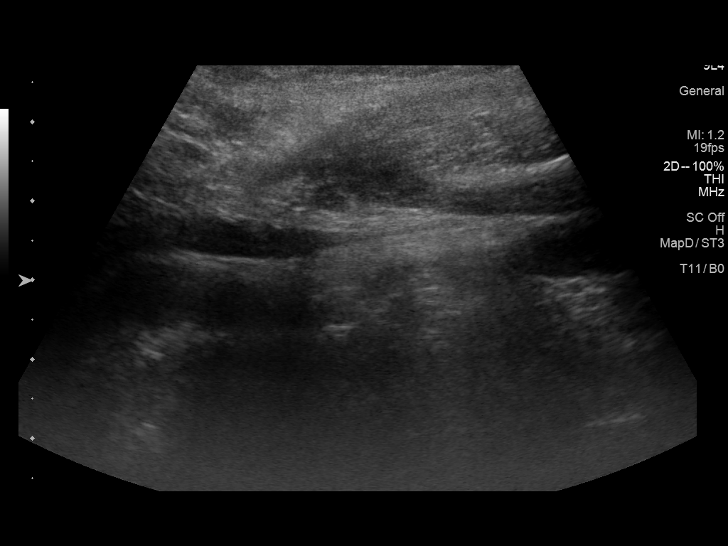
[im 14/18]
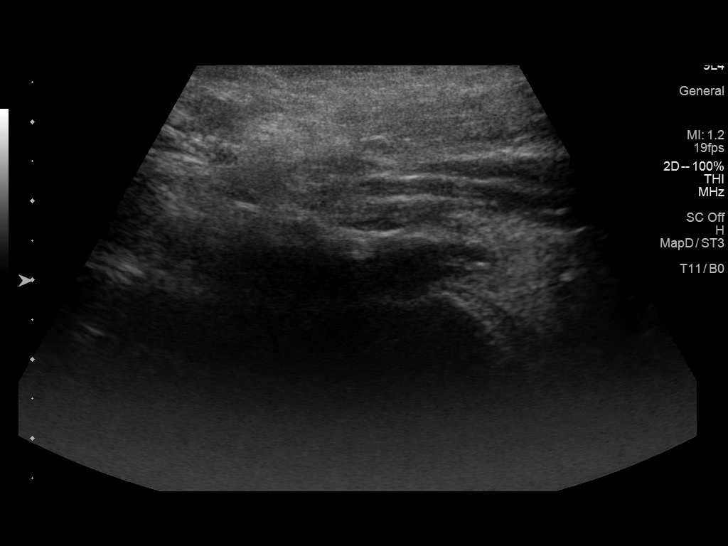
[im 15/18]
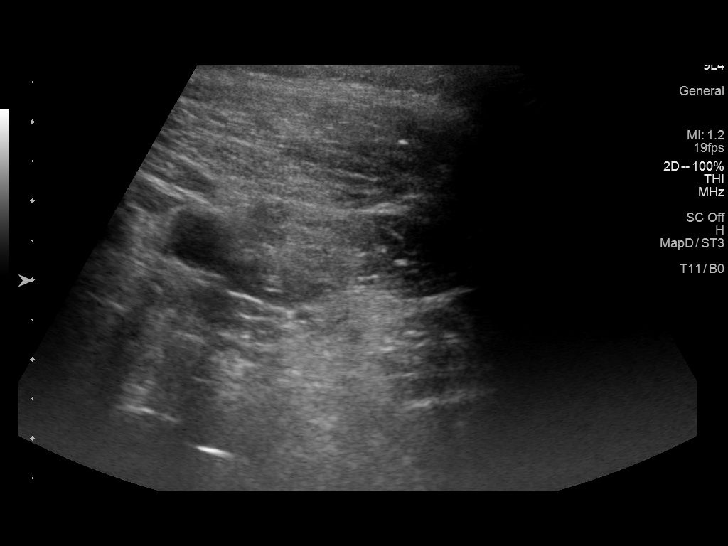
[im 17/18]
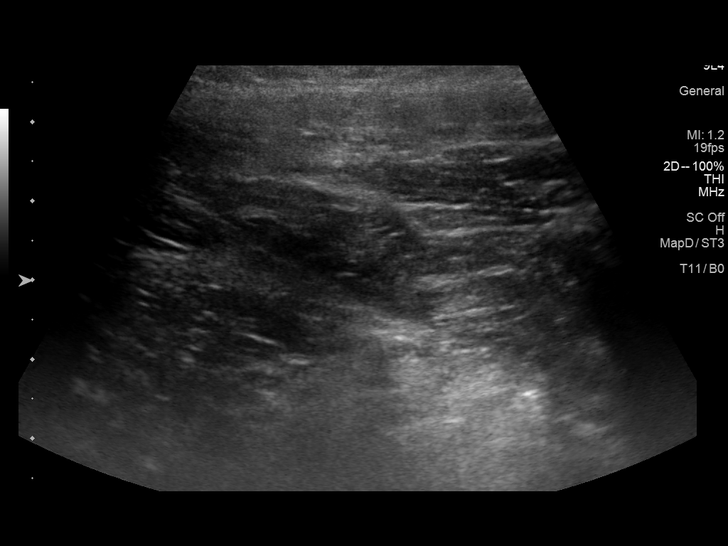
[im 18/18]
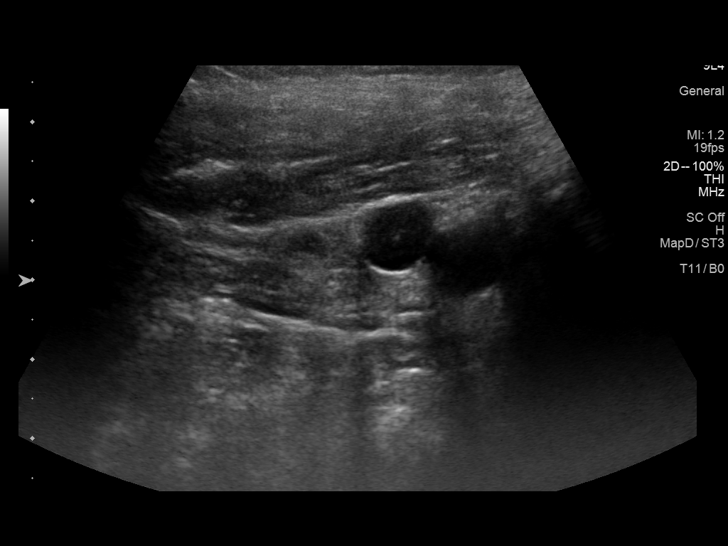

[14 of 18 positions shown; findings below may reference images not displayed]

FINDINGS: The thyroid gland is surgically absent. Evaluation of the left and
right resection bed demonstrates no evidence of residual thyroid
tissue or nodularity. No suspicious lymphadenopathy is identified.
IMPRESSION: Surgical changes of prior total thyroidectomy without evidence of
residual or recurrent thyroid tissue, nodularity or lymphadenopathy.

## 2019-10-11 ENCOUNTER — Encounter: Payer: Self-pay | Admitting: Internal Medicine

## 2019-10-20 DIAGNOSIS — H10023 Other mucopurulent conjunctivitis, bilateral: Secondary | ICD-10-CM | POA: Diagnosis not present

## 2020-01-12 DIAGNOSIS — H1033 Unspecified acute conjunctivitis, bilateral: Secondary | ICD-10-CM | POA: Diagnosis not present

## 2020-01-24 DIAGNOSIS — H10022 Other mucopurulent conjunctivitis, left eye: Secondary | ICD-10-CM | POA: Diagnosis not present

## 2020-02-07 DIAGNOSIS — I10 Essential (primary) hypertension: Secondary | ICD-10-CM | POA: Diagnosis not present

## 2020-02-07 DIAGNOSIS — R911 Solitary pulmonary nodule: Secondary | ICD-10-CM | POA: Diagnosis not present

## 2020-02-07 DIAGNOSIS — J449 Chronic obstructive pulmonary disease, unspecified: Secondary | ICD-10-CM | POA: Diagnosis not present

## 2020-02-07 DIAGNOSIS — F321 Major depressive disorder, single episode, moderate: Secondary | ICD-10-CM | POA: Diagnosis not present

## 2020-02-29 DIAGNOSIS — H2701 Aphakia, right eye: Secondary | ICD-10-CM | POA: Diagnosis not present

## 2020-02-29 DIAGNOSIS — H2512 Age-related nuclear cataract, left eye: Secondary | ICD-10-CM | POA: Diagnosis not present

## 2020-02-29 DIAGNOSIS — H10503 Unspecified blepharoconjunctivitis, bilateral: Secondary | ICD-10-CM | POA: Diagnosis not present

## 2020-02-29 DIAGNOSIS — H04123 Dry eye syndrome of bilateral lacrimal glands: Secondary | ICD-10-CM | POA: Diagnosis not present

## 2020-02-29 DIAGNOSIS — H02051 Trichiasis without entropian right upper eyelid: Secondary | ICD-10-CM | POA: Diagnosis not present

## 2020-03-21 DIAGNOSIS — H04123 Dry eye syndrome of bilateral lacrimal glands: Secondary | ICD-10-CM | POA: Diagnosis not present

## 2020-03-21 DIAGNOSIS — H2512 Age-related nuclear cataract, left eye: Secondary | ICD-10-CM | POA: Diagnosis not present

## 2020-03-21 DIAGNOSIS — H10503 Unspecified blepharoconjunctivitis, bilateral: Secondary | ICD-10-CM | POA: Diagnosis not present

## 2020-03-21 DIAGNOSIS — H2701 Aphakia, right eye: Secondary | ICD-10-CM | POA: Diagnosis not present

## 2020-04-17 DIAGNOSIS — Z0001 Encounter for general adult medical examination with abnormal findings: Secondary | ICD-10-CM | POA: Diagnosis not present

## 2020-04-17 DIAGNOSIS — E7800 Pure hypercholesterolemia, unspecified: Secondary | ICD-10-CM | POA: Diagnosis not present

## 2020-04-17 DIAGNOSIS — R911 Solitary pulmonary nodule: Secondary | ICD-10-CM | POA: Diagnosis not present

## 2020-04-17 DIAGNOSIS — F321 Major depressive disorder, single episode, moderate: Secondary | ICD-10-CM | POA: Diagnosis not present

## 2020-04-17 DIAGNOSIS — I719 Aortic aneurysm of unspecified site, without rupture: Secondary | ICD-10-CM | POA: Diagnosis not present

## 2020-04-17 DIAGNOSIS — L219 Seborrheic dermatitis, unspecified: Secondary | ICD-10-CM | POA: Diagnosis not present

## 2020-04-17 DIAGNOSIS — Z79899 Other long term (current) drug therapy: Secondary | ICD-10-CM | POA: Diagnosis not present

## 2020-04-17 DIAGNOSIS — Z125 Encounter for screening for malignant neoplasm of prostate: Secondary | ICD-10-CM | POA: Diagnosis not present

## 2020-04-17 DIAGNOSIS — G4733 Obstructive sleep apnea (adult) (pediatric): Secondary | ICD-10-CM | POA: Diagnosis not present

## 2020-04-17 DIAGNOSIS — E039 Hypothyroidism, unspecified: Secondary | ICD-10-CM | POA: Diagnosis not present

## 2020-04-17 DIAGNOSIS — I10 Essential (primary) hypertension: Secondary | ICD-10-CM | POA: Diagnosis not present

## 2020-04-17 DIAGNOSIS — J449 Chronic obstructive pulmonary disease, unspecified: Secondary | ICD-10-CM | POA: Diagnosis not present

## 2020-05-08 ENCOUNTER — Ambulatory Visit: Payer: Medicare HMO | Admitting: Emergency Medicine

## 2020-05-08 ENCOUNTER — Other Ambulatory Visit: Payer: Self-pay

## 2020-05-08 ENCOUNTER — Encounter: Payer: Self-pay | Admitting: Emergency Medicine

## 2020-05-08 VITALS — BP 120/62 | HR 67 | Temp 98.6°F | Ht 72.5 in | Wt 281.0 lb

## 2020-05-08 DIAGNOSIS — I779 Disorder of arteries and arterioles, unspecified: Secondary | ICD-10-CM | POA: Diagnosis not present

## 2020-05-08 DIAGNOSIS — J449 Chronic obstructive pulmonary disease, unspecified: Secondary | ICD-10-CM | POA: Diagnosis not present

## 2020-05-08 DIAGNOSIS — R911 Solitary pulmonary nodule: Secondary | ICD-10-CM

## 2020-05-08 MED ORDER — ALBUTEROL SULFATE HFA 108 (90 BASE) MCG/ACT IN AERS: 2.0000 | INHALATION_SPRAY | Freq: Four times a day (QID) | RESPIRATORY_TRACT | 3 refills | Status: DC | PRN

## 2020-05-08 NOTE — Progress Notes (Signed)
Subjective:    Patient ID: Timothy Conway, male    DOB: [birth year], 64 y.o.   MRN: [id number]  HPI  ROV 05/08/20 --[name] is a 64 year old smoker with COPD and history of pulmonary nodular disease.  He is also had a thyroidectomy for papillary thyroid cancer, some precancerous lesions taken on prior colonoscopy.  I last saw him in March 2019.  He had a CT scan of his chest May 10, 2018 that showed an overall stable 1.3 cm groundglass left upper lobe nodule.  He returns today to reestablish care and follow-up that lesion.  It measures 0.9 x 0.6 cm on CT chest 05/2017. I sent him to [facility] for possible VATS resection in 2019, but pt was scared to get this done. He was going to possibly get ENB with Dr Roxan Hockey, but this was canceled as well. He has been on Spiriva.  Currently he is on willexa for a study - plans to go back to Spiriva when this runs out. Needs an albuterol refill. He quit smoking!!  MDMM:  Reviewed CT;s from 06/28/2017, 01/04/2018, 05/10/2018 Reviewed office notes from Dr. Roxan Hockey 05/10/2018, Dr. Trula Slade 2018                                                                       Review of Systems  Constitutional: Negative for fever and unexpected weight change.  HENT: Negative for congestion, dental problem, ear pain, nosebleeds, postnasal drip, rhinorrhea, sinus pressure, sneezing, sore throat and trouble swallowing.   Eyes: Negative for redness and itching.  Respiratory: Positive for cough and shortness of breath. Negative for chest tightness and wheezing.   Cardiovascular: Negative for palpitations and leg swelling.  Gastrointestinal: Negative for nausea and vomiting.  Genitourinary: Negative for dysuria.  Musculoskeletal: Negative for joint swelling.  Skin: Negative for rash.  Neurological: Negative for headaches.  Hematological: Does not bruise/bleed easily.  Psychiatric/Behavioral: Negative for dysphoric mood. The patient is not nervous/anxious.    Past Medical  History:  Diagnosis Date  . Anxiety   . COPD (chronic obstructive pulmonary disease) ([place])   . Depression   . Dysrhythmia    hx of PAC's  . Gout   . Hyperlipidemia   . Hypertension   . Thyroid cancer ([place])    2018     Family History  Adopted: Yes     Social History   Socioeconomic History  . Marital status: Single    Spouse name: Not on file  . Number of children: Not on file  . Years of education: Not on file  . Highest education level: Not on file  Occupational History  . Not on file  Tobacco Use  . Smoking status: Former Smoker    Packs/day: 1.00    Years: 40.00    Pack years: 40.00    Types: Cigarettes    Quit date: 05/2019    Years since quitting: 1.0  . Smokeless tobacco: Never Used  Substance and Sexual Activity  . Alcohol use: Yes    Alcohol/week: 1.0 standard drinks    Types: 1 Cans of beer per week    Comment: moderate  . Drug use: No  . Sexual activity: Not Currently  Other Topics Concern  . Not on  file  Social History Narrative  . Not on file   Social Determinants of Health   Financial Resource Strain:   . Difficulty of Paying Living Expenses:   Food Insecurity:   . Worried About Charity fundraiser in the Last Year:   . Arboriculturist in the Last Year:   Transportation Needs:   . Film/video editor (Medical):   Marland Kitchen Lack of Transportation (Non-Medical):   Physical Activity:   . Days of Exercise per Week:   . Minutes of Exercise per Session:   Stress:   . Feeling of Stress :   Social Connections:   . Frequency of Communication with Friends and Family:   . Frequency of Social Gatherings with Friends and Family:   . Attends Religious Services:   . Active Member of Clubs or Organizations:   . Attends Archivist Meetings:   Marland Kitchen Marital Status:   Intimate Partner Violence:   . Fear of Current or Ex-Partner:   . Emotionally Abused:   Marland Kitchen Physically Abused:   . 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  No TB exposure or positive skin testing.   No Known Allergies   Outpatient Medications Prior to Visit  Medication Sig Dispense Refill  . ALPRAZolam (XANAX) 1 MG tablet Take 1 mg by mouth 3 (three) times daily as needed for anxiety.     . ARIPiprazole (ABILIFY) 5 MG tablet Take 5 mg by mouth at bedtime.     Marland Kitchen atenolol (TENORMIN) 100 MG tablet Take 100 mg by mouth 2 (two) times daily.    . Cholecalciferol (VITAMIN D) 2000 units tablet Take 2,000 Units by mouth daily.    . fluticasone (FLONASE) 50 MCG/ACT nasal spray Place 2 sprays into both nostrils daily as needed for allergies or rhinitis.    Marland Kitchen levothyroxine (SYNTHROID, LEVOTHROID) 137 MCG tablet Take 1 tablet (137 mcg total) by mouth daily. 90 tablet 3  . Multiple Vitamin (MULTIVITAMIN WITH MINERALS) TABS tablet Take 1 tablet by mouth daily.    Marland Kitchen omeprazole (PRILOSEC) 20 MG capsule Take 20 mg by mouth daily.    . Rosuvastatin Calcium (CRESTOR PO) Take 10 mg by mouth daily.    . Tiotropium Bromide Monohydrate (SPIRIVA RESPIMAT) 2.5 MCG/ACT AERS Inhale 2 puffs into the lungs daily. 3 Inhaler 3   No facility-administered medications prior to visit.        Objective:   Physical Exam Vitals:   05/08/20 1125  BP: 120/62  Pulse: 67  Temp: 98.6 F (37 C)  TempSrc: Oral  SpO2: 95%  Weight: 281 lb (127.5 kg)  Height: 6' 0.5" (1.842 m)   Gen: Pleasant, well-nourished, in no distress  ENT: No lesions,  mouth clear,  oropharynx clear, no postnasal drip  Neck: No JVD, no TMG, no carotid bruits  Lungs: No use of accessory muscles, clear, soft wheeze on a forced expiration  Cardiovascular: RRR, heart sounds normal, no murmur or gallops, no peripheral edema  Musculoskeletal: No deformities, no cyanosis or clubbing  Neuro: alert, non focal  Skin: Warm, no lesions or rashes       Assessment & Plan:  Pulmonary nodule,  left Needs follow up for interval change - given GG nature will follow for 5 years of stability  We will repeat your CT scan of the chest to follow both  your left upper lobe pulmonary nodule and also your history of an aortic ulcer. Follow with Dr. Lamonte Sakai next available after the CT scan is done so that we can review as well.  COPD (chronic obstructive pulmonary disease) ([place]) Continue Wixela until it runs out, then we will go back to your Spiriva once daily We will refill your albuterol to use 2 puffs up to every 4 hours if needed shortness of breath Congratulations on stopping smoking   Baltazar Apo, MD, PhD 05/08/2020, 5:40 PM [facility] [phone] or if no answer [phone]

## 2020-05-08 NOTE — Patient Instructions (Signed)
We will repeat your CT scan of the chest to follow both your left upper lobe pulmonary nodule and also your history of an aortic ulcer. Continue Wixela until it runs out, then we will go back to your Spiriva once daily We will refill your albuterol to use 2 puffs up to every 4 hours if needed shortness of breath Congratulations on stopping smoking We will arrange for you to follow-up with Dr. Trula Slade to review your CT chest Follow with Dr. Lamonte Sakai next available after the CT scan is done so that we can review as well.

## 2020-05-08 NOTE — Assessment & Plan Note (Signed)
Needs follow up for interval change - given GG nature will follow for 5 years of stability  We will repeat your CT scan of the chest to follow both your left upper lobe pulmonary nodule and also your history of an aortic ulcer. Follow with Dr. Lamonte Sakai next available after the CT scan is done so that we can review as well.

## 2020-05-08 NOTE — Assessment & Plan Note (Signed)
Continue Wixela until it runs out, then we will go back to your Spiriva once daily We will refill your albuterol to use 2 puffs up to every 4 hours if needed shortness of breath Congratulations on stopping smoking

## 2020-05-13 ENCOUNTER — Other Ambulatory Visit: Payer: Self-pay

## 2020-05-13 MED ORDER — LEVOTHYROXINE SODIUM 137 MCG PO TABS: 137.0000 ug | ORAL_TABLET | Freq: Every day | ORAL | 3 refills | Status: DC

## 2020-05-23 ENCOUNTER — Ambulatory Visit
Admission: RE | Admit: 2020-05-23 | Discharge: 2020-05-23 | Disposition: A | Discharge status: Home / Self Care | Payer: Medicare HMO | Source: Ambulatory Visit | Attending: Emergency Medicine | Admitting: Emergency Medicine

## 2020-05-23 DIAGNOSIS — I779 Disorder of arteries and arterioles, unspecified: Secondary | ICD-10-CM

## 2020-05-23 DIAGNOSIS — R911 Solitary pulmonary nodule: Secondary | ICD-10-CM | POA: Diagnosis not present

## 2020-05-23 DIAGNOSIS — I712 Thoracic aortic aneurysm, without rupture: Secondary | ICD-10-CM | POA: Diagnosis not present

## 2020-05-23 IMAGING — CT CT ANGIO CHEST
2 of 6 series · 13 of 36 positions shown · IV contrast (iopamidol)
Comparison: [DATE]; [DATE]; [DATE]; [DATE];
[DATE]; [DATE]

PET-CT-[DATE]

CLINICAL DATA: Nontraumatic aortic disease. Pulmonary nodule.
History of thyroid cancer. Former smoker.

EXAM:
CT ANGIOGRAPHY CHEST WITH CONTRAST
TECHNIQUE: Multidetector CT imaging of the chest was performed using the
standard protocol during bolus administration of intravenous
contrast. Multiplanar CT image reconstructions and MIPs were
obtained to evaluate the vascular anatomy.
CONTRAST:  75mL [7X] IOPAMIDOL ([7X]) INJECTION 76%

[Series 4: cta thorax 2.00 bv36 s3 axial arterial · axial · arterial · 0.73mm/px · z∈[+1509,+1788]mm · 12 of 166 slices shown]
[im 13/166  lung]
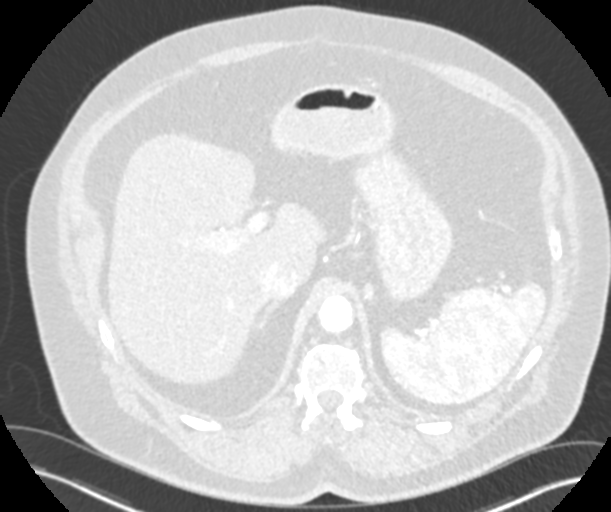
[im 26/166  mediastinal]
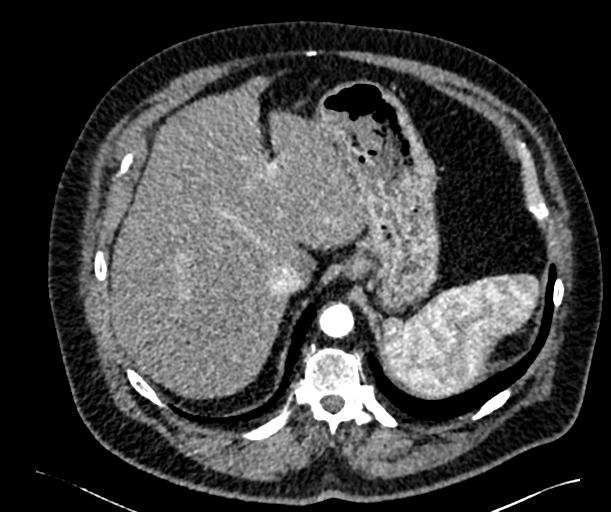
[im 39/166  lung]
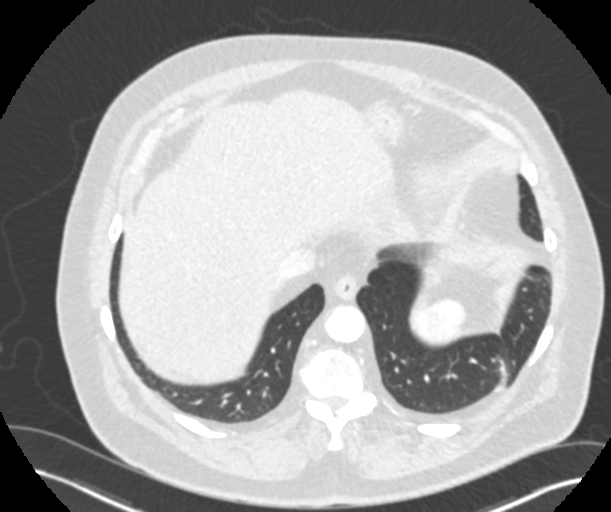
[im 51/166  mediastinal]
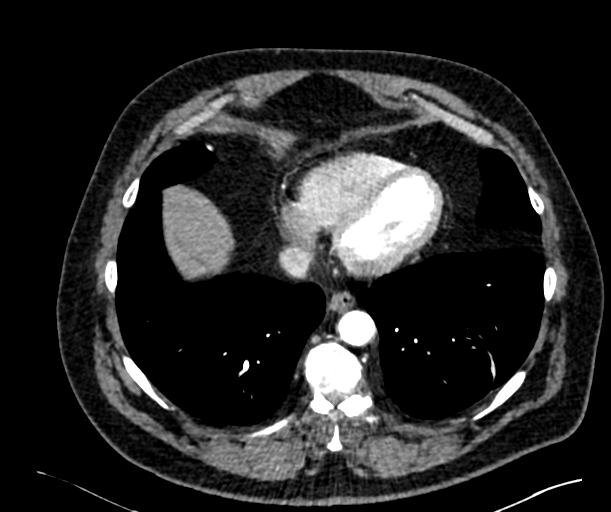
[im 64/166  lung]
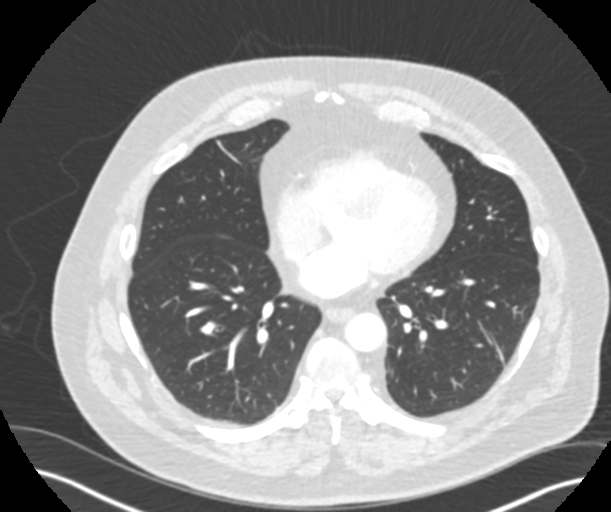
[im 77/166  mediastinal]
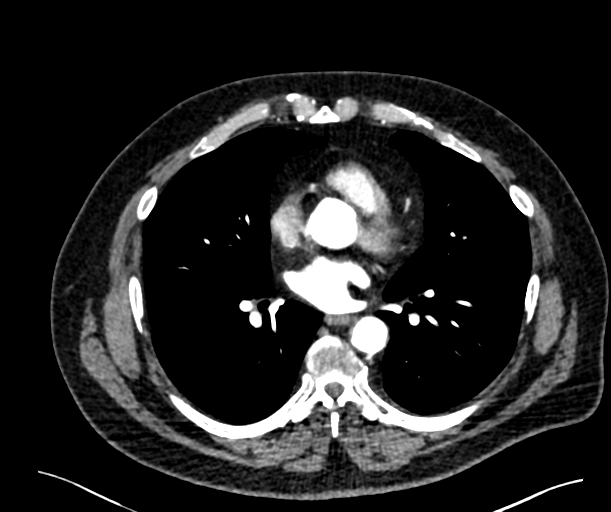
[im 89/166  lung]
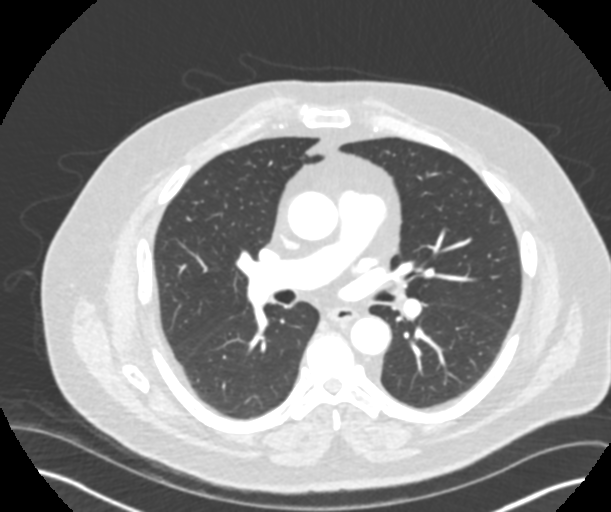
[im 102/166  mediastinal]
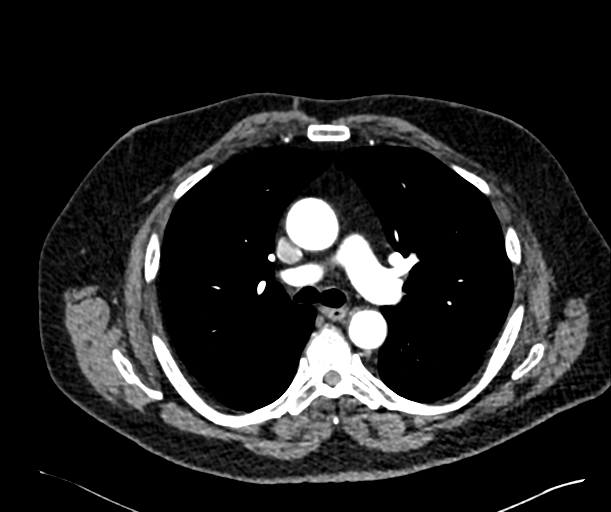
[im 115/166  lung]
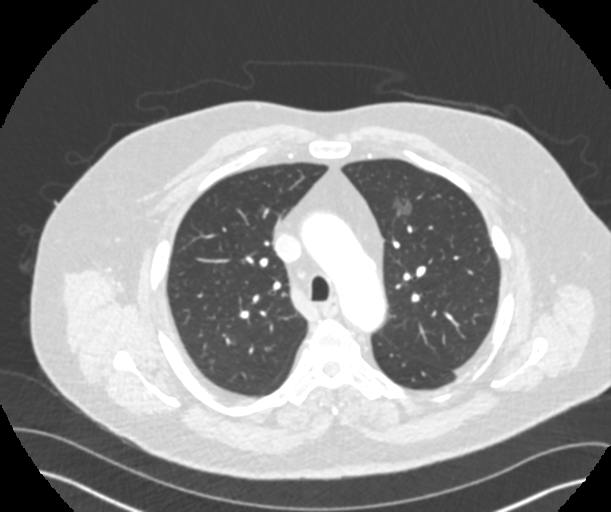
[im 127/166  mediastinal]
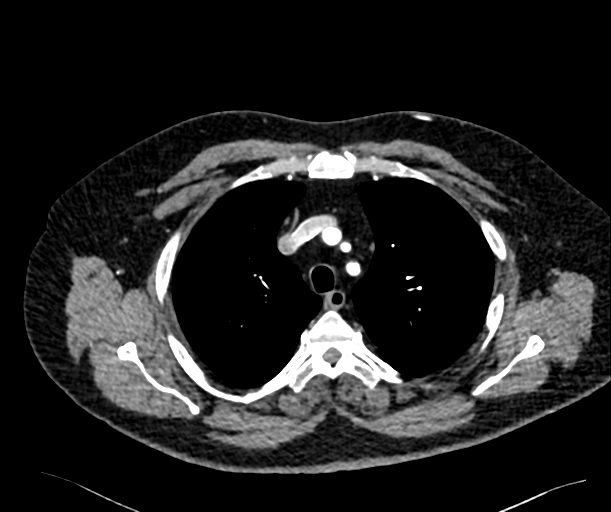
[im 140/166  lung]
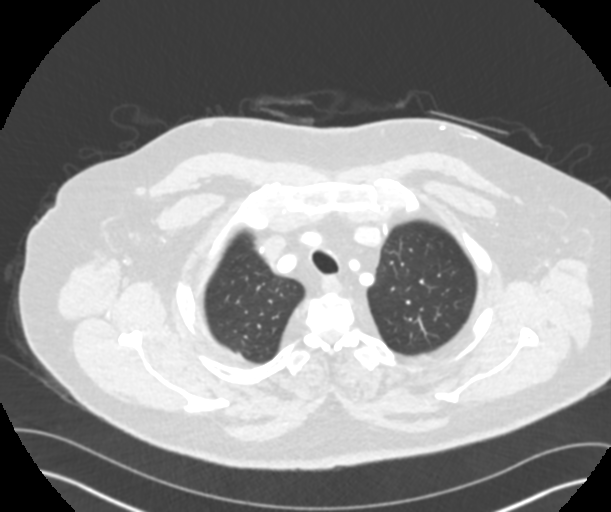
[im 153/166  mediastinal]
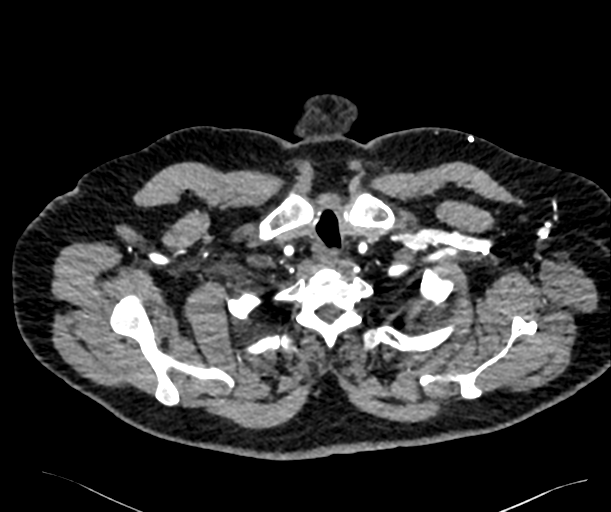

[Series 9: cta thorax 2.00 bv36 s3 cor st · coronal · 0.65mm/px · 1 of 185 slices shown]
[im 93/185  mediastinal]
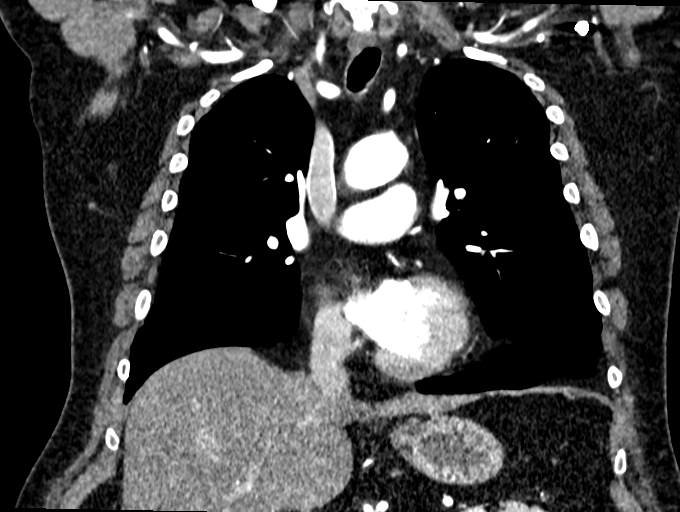

[13 of 36 positions shown; findings below may reference images not displayed]

FINDINGS: Vascular Findings:

Unchanged mild fusiform ectasia of the descending thoracic aorta
with measurements as follows. Minimal amount of mixed calcified and
noncalcified atherosclerotic plaque involving the aortic arch and
descending thoracic aorta, not resulting in a hemodynamically
significant stenosis.

Redemonstrated tiny (approximately 1.1 x 0.4 cm) penetrating
atherosclerotic ulcer involving the anterior aspect of the aortic
arch (image 53, series 4), without associated perivascular
stranding, unchanged compared to the [DATE] examination. No
evidence of thoracic aortic dissection or periaortic stranding this
nongated examination.

Conventional configuration of the aortic arch. The branch vessels of
the aortic arch appear patent throughout their imaged courses.

Normal heart size.  No pericardial effusion.

Although this examination was not tailored for the evaluation the
pulmonary arteries, there are no discrete filling defects within the
central pulmonary arterial tree to suggest central pulmonary
embolism. Normal caliber of the main pulmonary artery.

-------------------------------------------------------------

Thoracic aortic measurements:

Sinotubular junction

20 mm as measured in greatest oblique short axis coronal dimension.

Proximal ascending aorta

37 mm as measured in greatest oblique short axis axial dimension at
the level of the main pulmonary artery, unchanged compared to the
[7X] examination.

Aortic arch aorta

30 mm as measured in greatest oblique short axis sagittal dimension.

Proximal descending thoracic aorta

27 mm as measured in greatest oblique short axis axial dimension at
the level of the main pulmonary artery.

Distal descending thoracic aorta

24 mm as measured in greatest oblique short axis axial dimension at
the level of the diaphragmatic hiatus.

Review of the MIP images confirms the above findings.

-------------------------------------------------------------

Non-Vascular Findings:

Mediastinum/Lymph Nodes: No bulky mediastinal, hilar or axillary
lymphadenopathy.

Lungs/Pleura: Minimal dependent subpleural ground-glass atelectasis.
Minimal subsegmental atelectasis involving the left lower lobe and
caudal aspect of the right middle lobe. No discrete focal airspace
opacities. No pleural effusion or pneumothorax. The central
pulmonary airways appear widely patent.

Ill-defined ground-glass nodular opacity involving the left upper
lobe has increased in size measuring approximately 1.4 x 1.1 x
cm (image 52, series 6; coronal image 75, series 9), previously,
x 1.1 x 1.4 cm when compared to the [DATE] examination previously,
0.7 cm when compared to the [DATE] examination.

Upper abdomen: Limited early arterial phase evaluation of the upper
abdomen demonstrates diffuse decreased attenuation hepatic
parenchyma suggestive of hepatic steatosis. Mild diffuse thickening
of the left adrenal gland without discrete nodule.

Musculoskeletal: No acute or aggressive osseous abnormalities. Mild
(approximately 25%) compression deformity involving the superior
endplate of the T12 vertebral body is unchanged compared to the [7X]
examination. Bulky anteriorly directed bridging osteophytes are
noted about the T6 and T7 vertebral bodies (sagittal image 119,
series 11), incompletely imaged, though similar to the [7X]
examination.

Regional soft tissues appear normal.  Post total thyroidectomy.
IMPRESSION: 1. Stable uncomplicated fusiform ectasia of the ascending thoracic
aorta measuring 37 mm in diameter, unchanged compared to the [7X]
examination.
2. Unchanged tiny (approximately 1.1 cm) contained penetrating
atherosclerotic ulcer involving the anterior aspect of the aortic
arch, stable compared to the [7X] examination.
3.  Aortic Atherosclerosis ([7X]-[7X]).
4. Suspected hepatic steatosis.  Correlation with LFTs is advised.
5. Continued increase in size of ill-defined ground-glass nodule
within the left upper lobe has increased in size in the interval,
currently measuring 2 cm in diameter, previously, 1.4 cm (when
compared to the [7X] examination) and 0.7 cm (when compared to the
[7X] examination), not found to be hypermetabolic on PET-CT
performed [DATE], though again remains worrisome for slowly growing
bronchogenic adenocarcinoma. If not previously performed, referral
to thoracic surgery is advised.

## 2020-05-23 MED ORDER — IOPAMIDOL (ISOVUE-370) INJECTION 76%
75.0000 mL | Freq: Once | INTRAVENOUS | Status: AC | PRN
  Administered 2020-05-23: 75 mL via INTRAVENOUS

## 2020-06-04 DIAGNOSIS — R911 Solitary pulmonary nodule: Secondary | ICD-10-CM

## 2020-06-04 NOTE — Telephone Encounter (Signed)
Dr. Lamonte Sakai, please see pt's mychart message and advise on it. Thanks!

## 2020-06-05 DIAGNOSIS — G478 Other sleep disorders: Secondary | ICD-10-CM | POA: Diagnosis not present

## 2020-06-05 DIAGNOSIS — G4719 Other hypersomnia: Secondary | ICD-10-CM | POA: Diagnosis not present

## 2020-06-05 DIAGNOSIS — G4739 Other sleep apnea: Secondary | ICD-10-CM | POA: Diagnosis not present

## 2020-06-05 DIAGNOSIS — R0689 Other abnormalities of breathing: Secondary | ICD-10-CM | POA: Diagnosis not present

## 2020-06-05 DIAGNOSIS — J449 Chronic obstructive pulmonary disease, unspecified: Secondary | ICD-10-CM | POA: Diagnosis not present

## 2020-06-05 NOTE — Telephone Encounter (Signed)
I have reviewed his CT chest. Please let him know that I agree that bronchoscopy is next step in the evaluation of his slowly changing pulmonary nodule. If he agrees, then we should work on trying to set up in the near future.

## 2020-06-05 NOTE — Telephone Encounter (Signed)
Pt would like to proceed with the bronch. What day would work for your schedule?

## 2020-06-10 ENCOUNTER — Telehealth: Payer: Self-pay | Admitting: Emergency Medicine

## 2020-06-10 NOTE — Telephone Encounter (Signed)
I just got this scheduled for 07/20 Dr Lamonte Sakai will need to get [place] a new date that will work for the patient

## 2020-06-10 NOTE — Telephone Encounter (Signed)
Thank you :)

## 2020-06-10 NOTE — Telephone Encounter (Signed)
I have called and resc appt to 06/25/2020 @ 9:30am arrive 7:30am patients Covid test is scheduled for 06/22/2020 I have called [facility] to get disc waiting on a call back [facility] is unable to convert there disc

## 2020-06-10 NOTE — Telephone Encounter (Signed)
Spoke with patient, he states that 7/27 works better for him, he has to get a ride.  Thank you.

## 2020-06-10 NOTE — Telephone Encounter (Signed)
I have sent an order to [facility] to get ENB scheduled. Please let him know. Thanks.

## 2020-06-11 ENCOUNTER — Telehealth: Payer: Self-pay | Admitting: Emergency Medicine

## 2020-06-11 DIAGNOSIS — R911 Solitary pulmonary nodule: Secondary | ICD-10-CM

## 2020-06-11 NOTE — Telephone Encounter (Signed)
This is per Dr Lamonte Sakai request when he ordered a ENB CTA for 05/23/2020 was done at [facility] and they cant convert the disk to a Super D so if the doctor would like one we will need a order placed per whatever kind he wants.

## 2020-06-11 NOTE — Telephone Encounter (Signed)
Can triage get a little more information? What was ordered? What was converted? What type of order do we need and from which doctor? Thank you.

## 2020-06-11 NOTE — Telephone Encounter (Signed)
Order placed for super d ct. Routing back to [place].

## 2020-06-12 NOTE — Telephone Encounter (Signed)
Patient scheduled for  07/19 pt aware

## 2020-06-17 ENCOUNTER — Other Ambulatory Visit (HOSPITAL_BASED_OUTPATIENT_CLINIC_OR_DEPARTMENT_OTHER): Payer: Self-pay | Admitting: Emergency Medicine

## 2020-06-17 ENCOUNTER — Ambulatory Visit (HOSPITAL_BASED_OUTPATIENT_CLINIC_OR_DEPARTMENT_OTHER)
Admission: RE | Admit: 2020-06-17 | Discharge: 2020-06-17 | Disposition: A | Discharge status: Home / Self Care | Payer: Medicare HMO | Source: Ambulatory Visit | Attending: Emergency Medicine | Admitting: Emergency Medicine

## 2020-06-17 ENCOUNTER — Other Ambulatory Visit: Payer: Self-pay

## 2020-06-17 DIAGNOSIS — I700 Atherosclerosis of aorta: Secondary | ICD-10-CM | POA: Diagnosis not present

## 2020-06-17 DIAGNOSIS — J984 Other disorders of lung: Secondary | ICD-10-CM | POA: Diagnosis not present

## 2020-06-17 DIAGNOSIS — R911 Solitary pulmonary nodule: Secondary | ICD-10-CM | POA: Insufficient documentation

## 2020-06-17 IMAGING — CT CT CHEST SUPER D W/O CM
2 of 5 series · 15 of 36 positions shown, 18 images · non-contrast
Comparison: [DATE]

CLINICAL DATA: Follow-up left lung nodule.  Former smoker.

EXAM:
CT CHEST WITHOUT CONTRAST
TECHNIQUE: Multidetector CT imaging of the chest was performed using thin slice
collimation for electromagnetic bronchoscopy planning purposes,
without intravenous contrast.

[Series 5: super d · axial · 0.79mm/px · z∈[-194,+110]mm · 12 of 421 slices shown, 15 images]
[im 21/421  mediastinal]
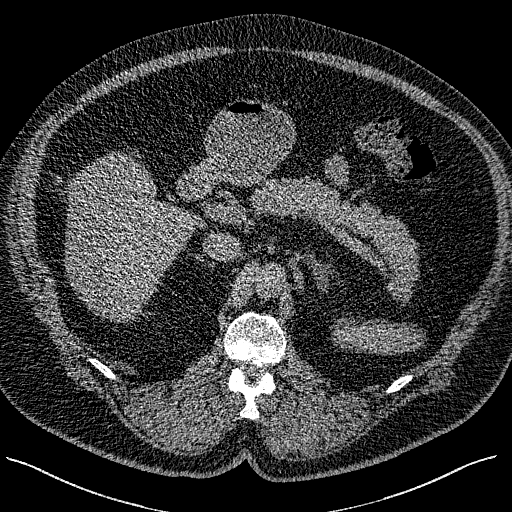
[im 21/421  lung]
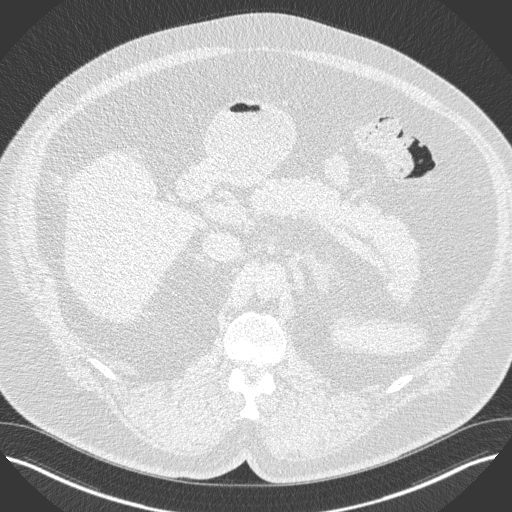
[im 61/421  lung]
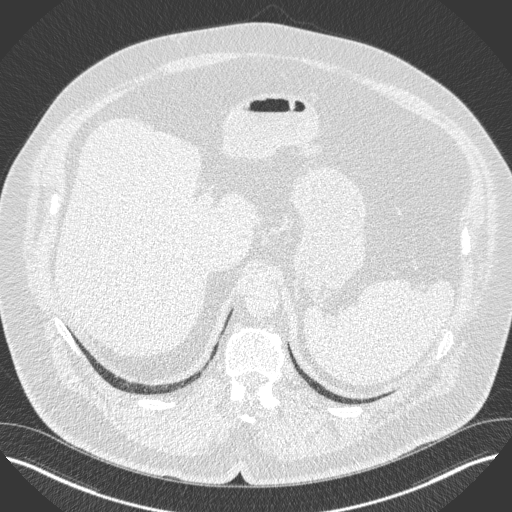
[im 101/421  lung]
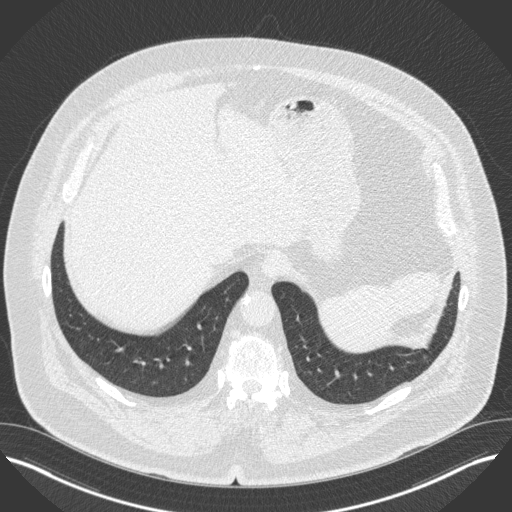
[im 121/421  lung]
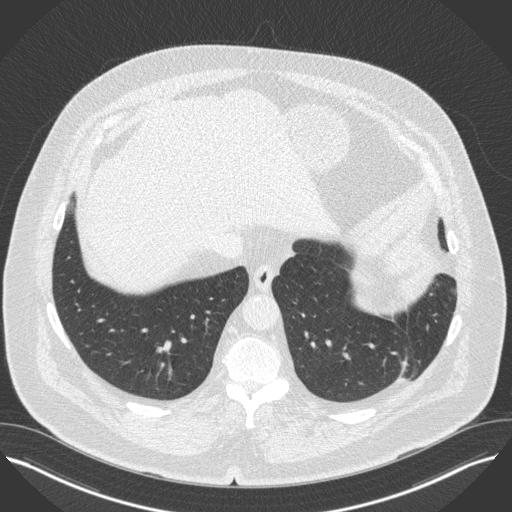
[im 161/421  mediastinal]
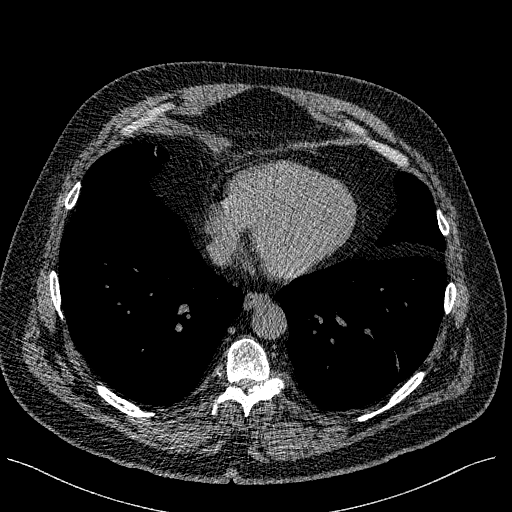
[im 161/421  lung]
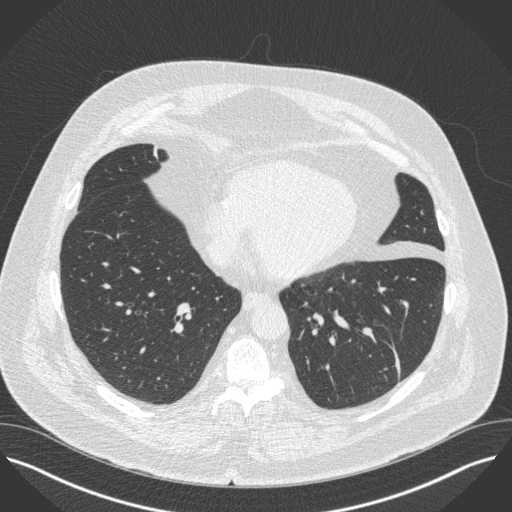
[im 201/421  lung]
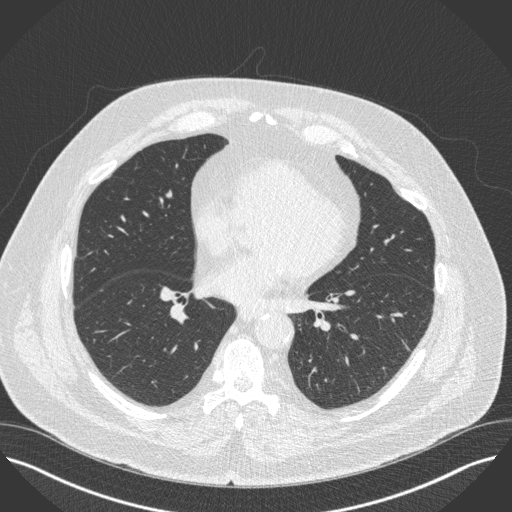
[im 221/421  lung]
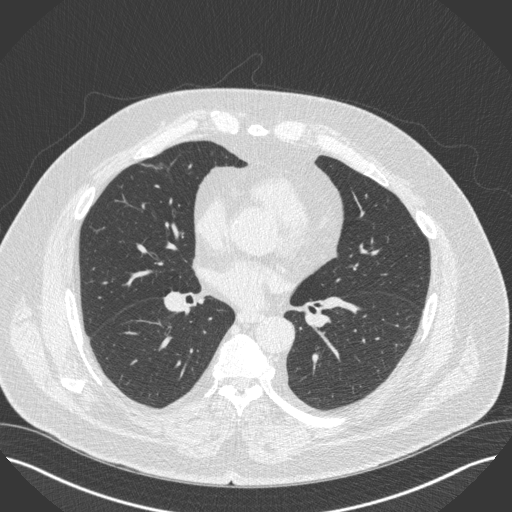
[im 261/421  lung]
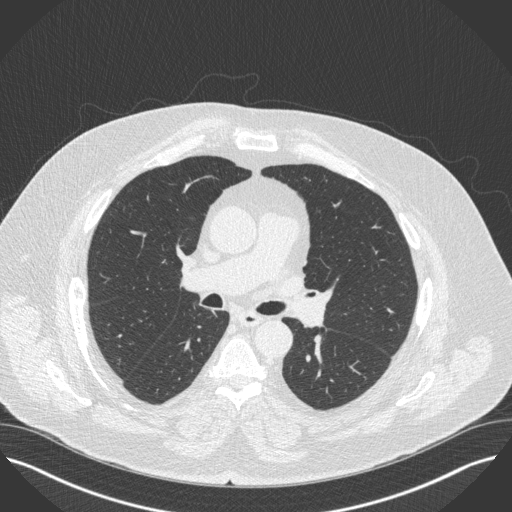
[im 301/421  mediastinal]
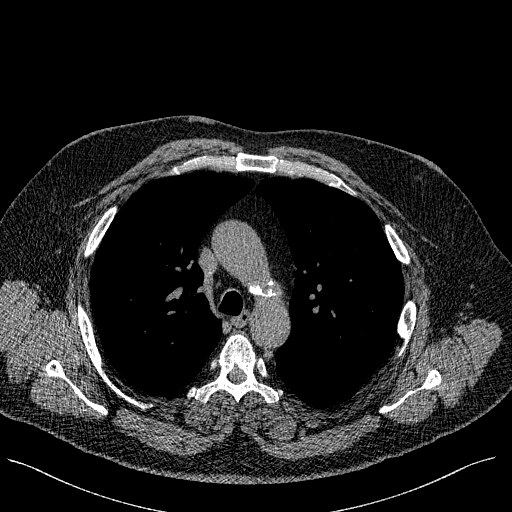
[im 301/421  lung]
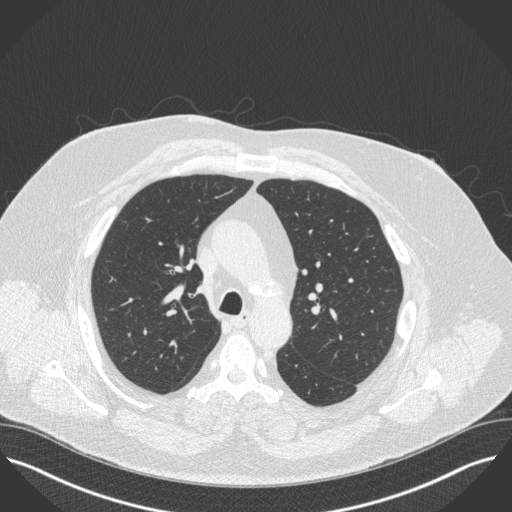
[im 321/421  lung]
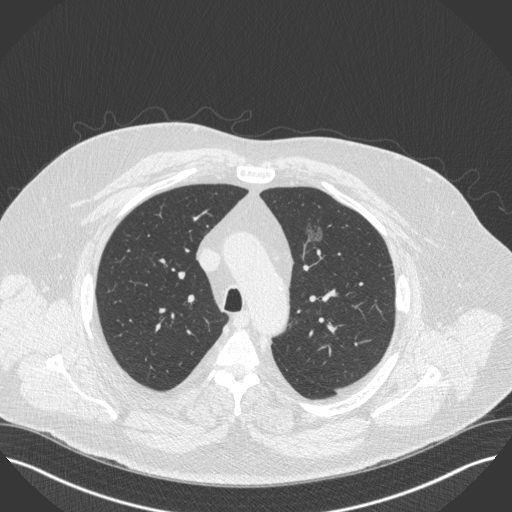
[im 361/421  lung]
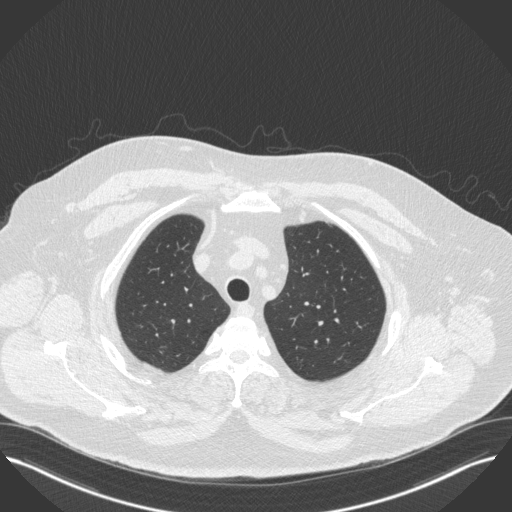
[im 401/421  lung]
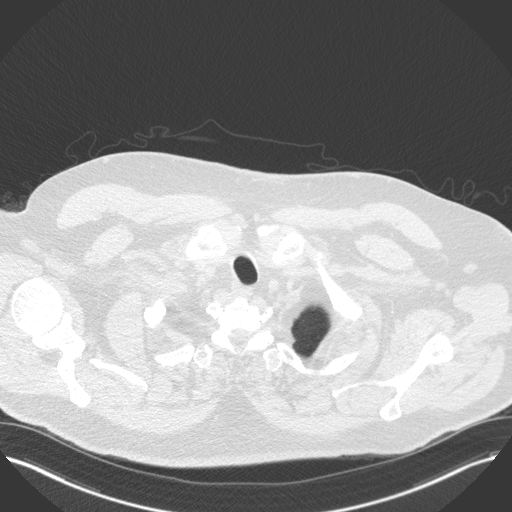

[Series 6: coronal · coronal · 0.66mm/px · 3 of 183 slices shown]
[im 37/183  lung]
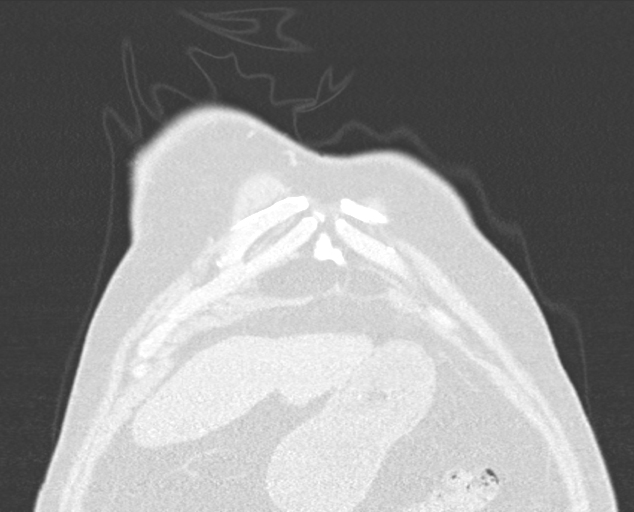
[im 73/183  lung]
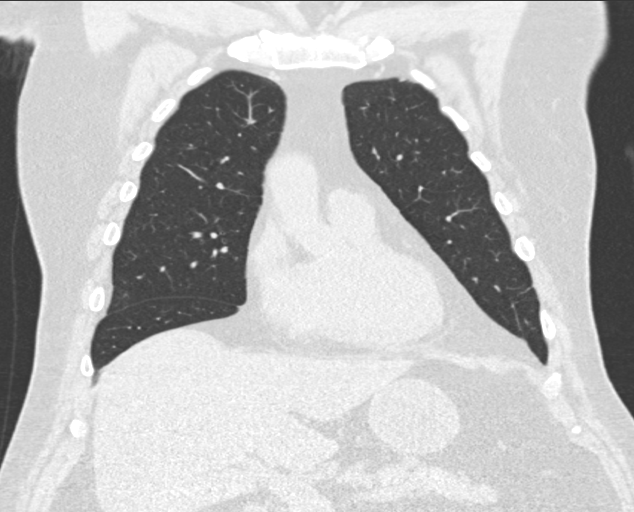
[im 110/183  lung]
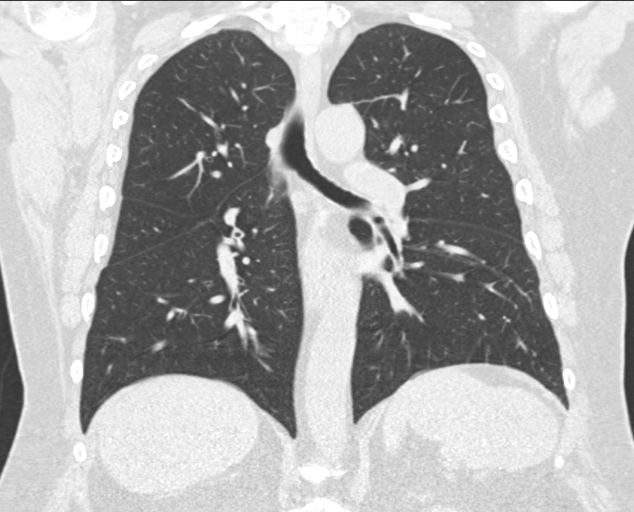

[15 of 36 positions shown; findings below may reference images not displayed]

FINDINGS: Cardiovascular: The heart size appears normal. There is no
pericardial effusion identified. Aortic atherosclerosis.

Mediastinum/Nodes: No enlarged mediastinal, hilar, or axillary lymph
nodes. Thyroid gland, trachea, and esophagus demonstrate no
significant findings.

Lungs/Pleura: Ground-glass attenuating nodule within the left upper
lobe is again seen. This measures 1.4 x 1.1 cm, image [DATE].
Unchanged in size from previous exam. No central solid component is
identified within this nodule. Scar is noted within the posterior
left lower lobe and right middle lobe.

Upper Abdomen: No acute abnormality.

Musculoskeletal: No chest wall mass or suspicious bone lesions
identified.
IMPRESSION: 1. Stable appearance of ground-glass attenuating nodule within the
left upper lobe compared with [DATE]. No new findings.
2. Aortic atherosclerosis.

Aortic Atherosclerosis ([4S]-[4S]).

## 2020-06-20 ENCOUNTER — Telehealth: Payer: Self-pay | Admitting: Emergency Medicine

## 2020-06-20 NOTE — Telephone Encounter (Signed)
Called and spoke with patient. Let him know that his Bronch is 7/27 at 9:15 and that he needs to be there an hour and a half before that so for him to get there around 7:30. Patient said he knew where to go and informed him that procedure would take about an hour. Patient expressed understanding. Nothing further needed at this time

## 2020-06-21 ENCOUNTER — Encounter (HOSPITAL_COMMUNITY): Payer: Self-pay | Admitting: Emergency Medicine

## 2020-06-21 NOTE — Progress Notes (Signed)
PCP - Dr Lujean Amel Internal Med - Dr Philemon Kingdom Cardiologist - n/a  Chest x-ray - n/a, CT Chest 06/17/20 EKG - DOS 06/25/20 Stress Test - n/a ECHO - n/a Cardiac Cath - n/a  Aspirin Instructions: Follow your surgeon's instructions on when to stop aspirin prior to surgery,  If no instructions were given by your surgeon then you will need to call the office for those instructions.  STOP now taking any Aspirin (unless otherwise instructed by your surgeon), Aleve, Naproxen, Ibuprofen, Motrin, Advil, Goody's, BC's, all herbal medications, fish oil, and all vitamins.   Coronavirus Screening Covid test scheduled on 06/22/20@10 :30 am. Do you have any of the following symptoms:  Cough yes/no: No Fever (>100.27F)  yes/no: No Runny nose yes/no: No Sore throat yes/no: No Difficulty breathing/shortness of breath  yes/no: No  Have you traveled in the last 14 days and where? yes/no: No  Patient verbalized understanding of instructions that were given via phone.

## 2020-06-22 ENCOUNTER — Other Ambulatory Visit (HOSPITAL_COMMUNITY)
Admission: RE | Admit: 2020-06-22 | Discharge: 2020-06-22 | Disposition: A | Discharge status: Home / Self Care | Payer: Medicare HMO | Source: Ambulatory Visit | Attending: Emergency Medicine | Admitting: Emergency Medicine

## 2020-06-22 DIAGNOSIS — Z01812 Encounter for preprocedural laboratory examination: Secondary | ICD-10-CM | POA: Insufficient documentation

## 2020-06-22 DIAGNOSIS — Z20822 Contact with and (suspected) exposure to covid-19: Secondary | ICD-10-CM | POA: Diagnosis not present

## 2020-06-22 LAB — SARS CORONAVIRUS 2 (TAT 6-24 HRS): SARS Coronavirus 2: NEGATIVE

## 2020-06-24 NOTE — Anesthesia Preprocedure Evaluation (Addendum)
Anesthesia Evaluation  Patient identified by MRN, date of birth, ID band Patient awake    Reviewed: Allergy & Precautions, NPO status , Patient's Chart, lab work & pertinent test results  Airway Mallampati: IV  TM Distance: >3 FB Neck ROM: Full  Mouth opening: Limited Mouth Opening Comment: Airway note 2018 Ventilation: Mask ventilation without difficulty Laryngoscope Size: Miller and 2 Grade View: Grade II Tube type: Oral Tube size: 7.5 mm Number of attempts: 1 Dental no notable dental hx. (+) Edentulous Upper, Edentulous Lower, Dental Advisory Given   Pulmonary neg pulmonary ROS, COPD, Current Smoker and Patient abstained from smoking., former smoker,    Pulmonary exam normal breath sounds clear to auscultation       Cardiovascular hypertension, Pt. on medications and Pt. on home beta blockers negative cardio ROS Normal cardiovascular exam+ dysrhythmias  Rhythm:Regular Rate:Normal     Neuro/Psych PSYCHIATRIC DISORDERS Anxiety Depression negative neurological ROS  negative psych ROS   GI/Hepatic negative GI ROS, Neg liver ROS, GERD  ,  Endo/Other  negative endocrine ROSHypothyroidism Morbid obesity  Renal/GU negative Renal ROS  negative genitourinary   Musculoskeletal negative musculoskeletal ROS (+)   Abdominal   Peds negative pediatric ROS (+)  Hematology negative hematology ROS (+)   Anesthesia Other Findings   Reproductive/Obstetrics negative OB ROS                           Anesthesia Physical  Anesthesia Plan  ASA: III  Anesthesia Plan: General   Post-op Pain Management:    Induction: Intravenous  PONV Risk Score and Plan: 2 and Treatment may vary due to age or medical condition  Airway Management Planned: Oral ETT and LMA  Additional Equipment:   Intra-op Plan:   Post-operative Plan: Extubation in OR  Informed Consent: I have reviewed the patients History and  Physical, chart, labs and discussed the procedure including the risks, benefits and alternatives for the proposed anesthesia with the patient or authorized representative who has indicated his/her understanding and acceptance.     Dental advisory given  Plan Discussed with: CRNA and Anesthesiologist  Anesthesia Plan Comments: (  )       Anesthesia Quick Evaluation

## 2020-06-25 ENCOUNTER — Ambulatory Visit (HOSPITAL_COMMUNITY)
Admission: RE | Admit: 2020-06-25 | Discharge: 2020-06-25 | Disposition: A | Discharge status: Home / Self Care | Payer: Medicare HMO | Attending: Emergency Medicine | Admitting: Emergency Medicine

## 2020-06-25 ENCOUNTER — Ambulatory Visit (HOSPITAL_COMMUNITY): Payer: Medicare HMO

## 2020-06-25 ENCOUNTER — Ambulatory Visit (HOSPITAL_COMMUNITY): Payer: Medicare HMO | Admitting: Anesthesiology

## 2020-06-25 ENCOUNTER — Other Ambulatory Visit: Payer: Self-pay

## 2020-06-25 ENCOUNTER — Encounter (HOSPITAL_COMMUNITY): Payer: Self-pay | Admitting: Emergency Medicine

## 2020-06-25 ENCOUNTER — Encounter (HOSPITAL_COMMUNITY)
Admission: RE | Disposition: A | Discharge status: Home / Self Care | Payer: Self-pay | Source: Home / Self Care | Attending: Emergency Medicine

## 2020-06-25 DIAGNOSIS — F419 Anxiety disorder, unspecified: Secondary | ICD-10-CM | POA: Diagnosis not present

## 2020-06-25 DIAGNOSIS — Z79899 Other long term (current) drug therapy: Secondary | ICD-10-CM | POA: Insufficient documentation

## 2020-06-25 DIAGNOSIS — Z85850 Personal history of malignant neoplasm of thyroid: Secondary | ICD-10-CM | POA: Insufficient documentation

## 2020-06-25 DIAGNOSIS — E890 Postprocedural hypothyroidism: Secondary | ICD-10-CM | POA: Diagnosis not present

## 2020-06-25 DIAGNOSIS — Z7982 Long term (current) use of aspirin: Secondary | ICD-10-CM | POA: Insufficient documentation

## 2020-06-25 DIAGNOSIS — Z87891 Personal history of nicotine dependence: Secondary | ICD-10-CM | POA: Diagnosis not present

## 2020-06-25 DIAGNOSIS — C3492 Malignant neoplasm of unspecified part of left bronchus or lung: Secondary | ICD-10-CM | POA: Diagnosis present

## 2020-06-25 DIAGNOSIS — J449 Chronic obstructive pulmonary disease, unspecified: Secondary | ICD-10-CM | POA: Insufficient documentation

## 2020-06-25 DIAGNOSIS — Z6836 Body mass index (BMI) 36.0-36.9, adult: Secondary | ICD-10-CM | POA: Diagnosis not present

## 2020-06-25 DIAGNOSIS — Z79890 Hormone replacement therapy (postmenopausal): Secondary | ICD-10-CM | POA: Insufficient documentation

## 2020-06-25 DIAGNOSIS — E785 Hyperlipidemia, unspecified: Secondary | ICD-10-CM | POA: Diagnosis not present

## 2020-06-25 DIAGNOSIS — K219 Gastro-esophageal reflux disease without esophagitis: Secondary | ICD-10-CM | POA: Insufficient documentation

## 2020-06-25 DIAGNOSIS — F329 Major depressive disorder, single episode, unspecified: Secondary | ICD-10-CM | POA: Insufficient documentation

## 2020-06-25 DIAGNOSIS — R911 Solitary pulmonary nodule: Secondary | ICD-10-CM | POA: Diagnosis not present

## 2020-06-25 DIAGNOSIS — I10 Essential (primary) hypertension: Secondary | ICD-10-CM | POA: Insufficient documentation

## 2020-06-25 DIAGNOSIS — Z9889 Other specified postprocedural states: Secondary | ICD-10-CM

## 2020-06-25 DIAGNOSIS — J9811 Atelectasis: Secondary | ICD-10-CM | POA: Diagnosis not present

## 2020-06-25 HISTORY — DX: Hypothyroidism, unspecified: E03.9

## 2020-06-25 HISTORY — PX: BRONCHIAL NEEDLE ASPIRATION BIOPSY: SHX5106

## 2020-06-25 HISTORY — DX: Dry eye syndrome of unspecified lacrimal gland: H04.129

## 2020-06-25 HISTORY — DX: Gastro-esophageal reflux disease without esophagitis: K21.9

## 2020-06-25 HISTORY — PX: BRONCHIAL BIOPSY: SHX5109

## 2020-06-25 HISTORY — PX: BRONCHIAL WASHINGS: SHX5105

## 2020-06-25 HISTORY — PX: BRONCHIAL BRUSHINGS: SHX5108

## 2020-06-25 HISTORY — PX: FIDUCIAL MARKER PLACEMENT: SHX6858

## 2020-06-25 HISTORY — PX: VIDEO BRONCHOSCOPY WITH ENDOBRONCHIAL NAVIGATION: SHX6175

## 2020-06-25 LAB — COMPREHENSIVE METABOLIC PANEL
ALT: 15 U/L (ref 0–44)
AST: 16 U/L (ref 15–41)
Albumin: 3.7 g/dL (ref 3.5–5.0)
Alkaline Phosphatase: 28 U/L — ABNORMAL LOW (ref 38–126)
Anion gap: 8 (ref 5–15)
BUN: 14 mg/dL (ref 8–23)
CO2: 26 mmol/L (ref 22–32)
Calcium: 8.4 mg/dL — ABNORMAL LOW (ref 8.9–10.3)
Chloride: 106 mmol/L (ref 98–111)
Creatinine, Ser: 1.16 mg/dL (ref 0.61–1.24)
GFR calc Af Amer: >60 mL/min (ref >60)
GFR calc non Af Amer: >60 mL/min (ref >60)
Glucose, Bld: 95 mg/dL (ref 70–99)
Potassium: 4 mmol/L (ref 3.5–5.1)
Sodium: 140 mmol/L (ref 135–145)
Total Bilirubin: 0.8 mg/dL (ref 0.3–1.2)
Total Protein: 6.6 g/dL (ref 6.5–8.1)

## 2020-06-25 LAB — CBC
HCT: 47.6 % (ref 39.0–52.0)
Hemoglobin: 14.6 g/dL (ref 13.0–17.0)
MCH: 29.5 pg (ref 26.0–34.0)
MCHC: 30.7 g/dL (ref 30.0–36.0)
MCV: 96.2 fL (ref 80.0–100.0)
Platelets: 215 10*3/uL (ref 150–400)
RBC: 4.95 MIL/uL (ref 4.22–5.81)
RDW: 12.4 % (ref 11.5–15.5)
WBC: 7.4 10*3/uL (ref 4.0–10.5)
nRBC: 0 % (ref 0.0–0.2)

## 2020-06-25 LAB — PROTIME-INR
INR: 1.2 (ref 0.8–1.2)
Prothrombin Time: 14.6 seconds (ref 11.4–15.2)

## 2020-06-25 LAB — APTT: aPTT: 33 seconds (ref 24–36)

## 2020-06-25 IMAGING — DX DG CHEST 1V PORT
1 series · 1 of 1 positions shown · non-contrast
Comparison: CT [DATE].  Chest x-ray [DATE].

CLINICAL DATA: Status post bronchoscopy biopsy.

EXAM:
PORTABLE CHEST 1 VIEW

[chest ap]
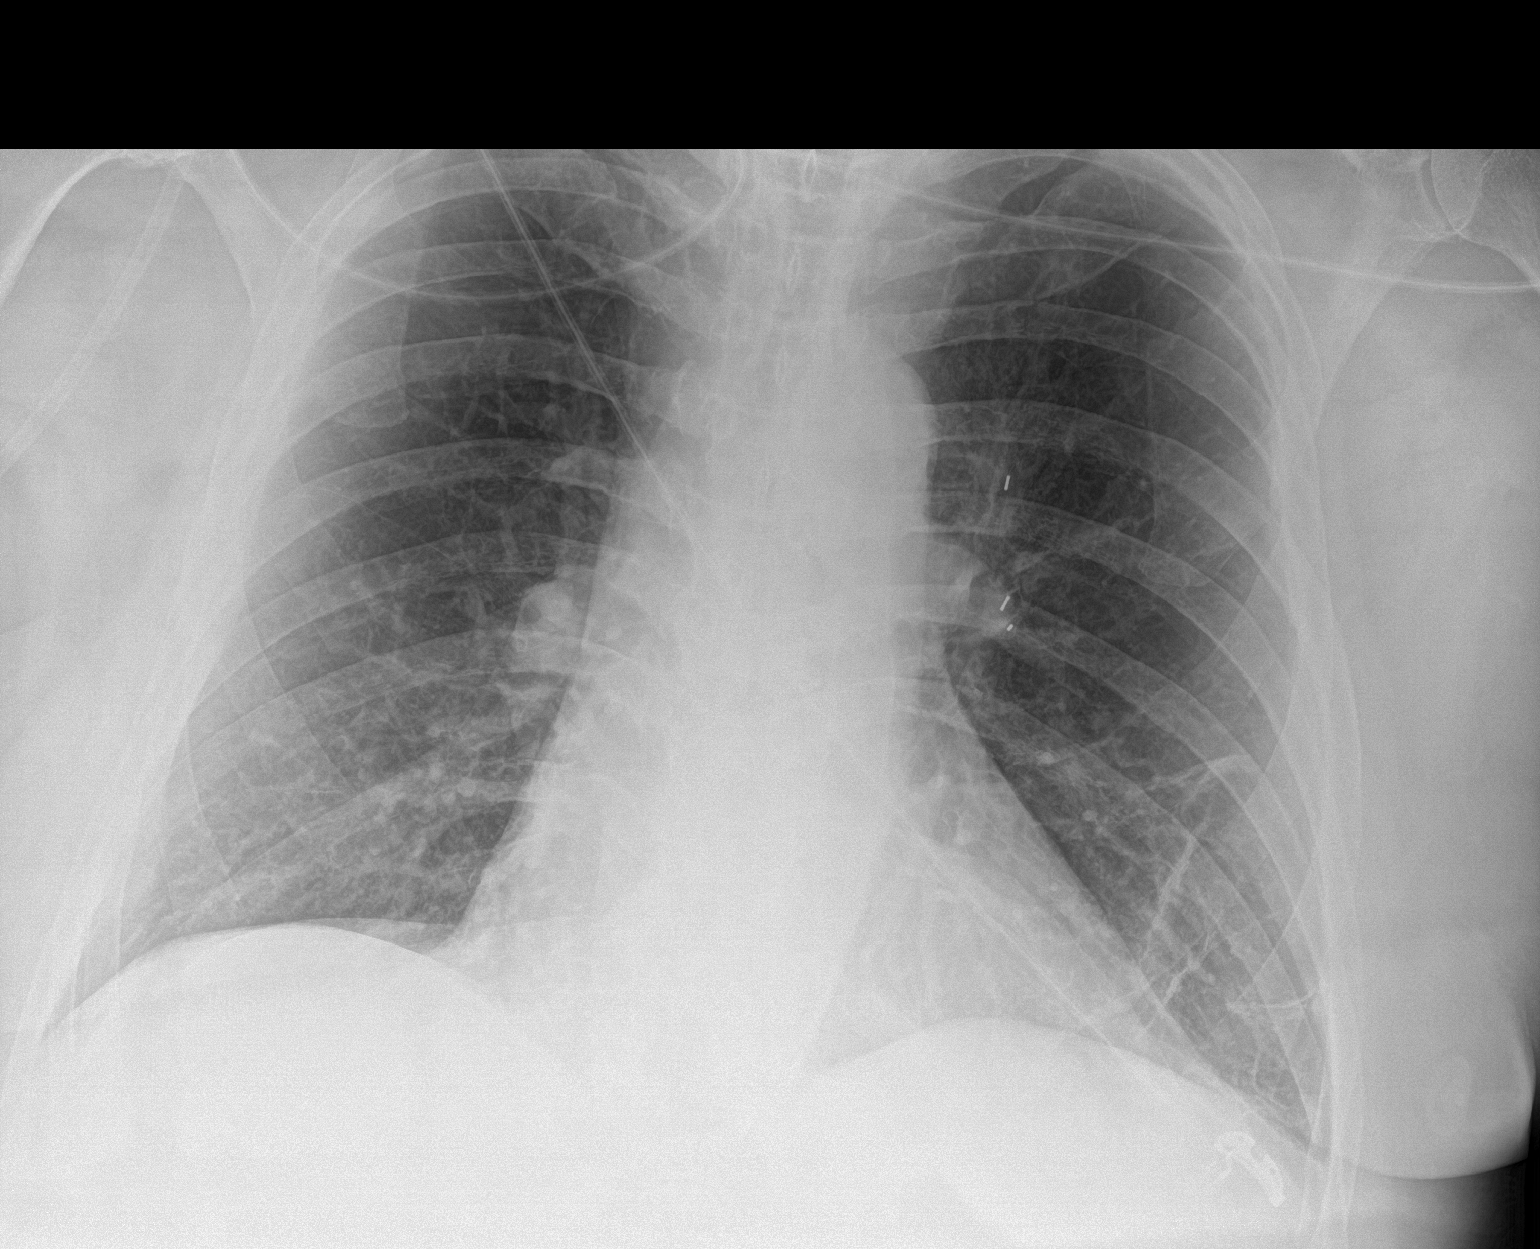

[1 of 1 positions shown; findings below may reference images not displayed]

FINDINGS: Mediastinum and hilar structures are normal. Heart size normal.
Surgical clips left hilar region. Left base atelectasis and or
scarring noted. No pleural effusion or pneumothorax.
IMPRESSION: Left base subsegmental atelectasis and or scarring. No acute
abnormality identified.

## 2020-06-25 SURGERY — VIDEO BRONCHOSCOPY WITH ENDOBRONCHIAL NAVIGATION
Anesthesia: General

## 2020-06-25 MED ORDER — PROPOFOL 10 MG/ML IV BOLUS
INTRAVENOUS | Status: DC | PRN
  Administered 2020-06-25: 150 mg via INTRAVENOUS

## 2020-06-25 MED ORDER — FENTANYL CITRATE (PF) 100 MCG/2ML IJ SOLN
INTRAMUSCULAR | Status: DC | PRN
  Administered 2020-06-25 (×2): 50 ug via INTRAVENOUS

## 2020-06-25 MED ORDER — MIDAZOLAM HCL 5 MG/5ML IJ SOLN
INTRAMUSCULAR | Status: DC | PRN
  Administered 2020-06-25: 1 mg via INTRAVENOUS

## 2020-06-25 MED ORDER — CHLORHEXIDINE GLUCONATE 0.12 % MT SOLN
15.0000 mL | Freq: Once | OROMUCOSAL | Status: AC
  Administered 2020-06-25: 15 mL via OROMUCOSAL
  Filled 2020-06-25 (×2): qty 15

## 2020-06-25 MED ORDER — ATROPINE SULFATE 0.4 MG/ML IV SOSY
PREFILLED_SYRINGE | INTRAVENOUS | Status: DC | PRN
Start: 2020-06-25 — End: 2020-06-25
  Administered 2020-06-25: .2 mg via INTRAVENOUS

## 2020-06-25 MED ORDER — EPHEDRINE SULFATE-NACL 50-0.9 MG/10ML-% IV SOSY
PREFILLED_SYRINGE | INTRAVENOUS | Status: DC | PRN
  Administered 2020-06-25: 5 mg via INTRAVENOUS

## 2020-06-25 MED ORDER — LACTATED RINGERS IV SOLN: INTRAVENOUS | Status: DC

## 2020-06-25 MED ORDER — PHENYLEPHRINE HCL-NACL 10-0.9 MG/250ML-% IV SOLN
INTRAVENOUS | Status: DC | PRN
  Administered 2020-06-25: 60 ug/min via INTRAVENOUS

## 2020-06-25 MED ORDER — GLYCOPYRROLATE PF 0.2 MG/ML IJ SOSY
PREFILLED_SYRINGE | INTRAMUSCULAR | Status: DC | PRN
Start: 2020-06-25 — End: 2020-06-25
  Administered 2020-06-25 (×2): .1 mg via INTRAVENOUS

## 2020-06-25 MED ORDER — LACTATED RINGERS IV SOLN: INTRAVENOUS | Status: DC | PRN

## 2020-06-25 MED ORDER — SUGAMMADEX SODIUM 200 MG/2ML IV SOLN
INTRAVENOUS | Status: DC | PRN
  Administered 2020-06-25: 200 mg via INTRAVENOUS

## 2020-06-25 MED ORDER — LIDOCAINE 2% (20 MG/ML) 5 ML SYRINGE
INTRAMUSCULAR | Status: DC | PRN
  Administered 2020-06-25: 60 mg via INTRAVENOUS

## 2020-06-25 MED ORDER — ONDANSETRON HCL 4 MG/2ML IJ SOLN
INTRAMUSCULAR | Status: DC | PRN
  Administered 2020-06-25: 4 mg via INTRAVENOUS

## 2020-06-25 MED ORDER — ROCURONIUM BROMIDE 10 MG/ML (PF) SYRINGE
PREFILLED_SYRINGE | INTRAVENOUS | Status: DC | PRN
  Administered 2020-06-25: 60 mg via INTRAVENOUS

## 2020-06-25 MED ORDER — DEXAMETHASONE SODIUM PHOSPHATE 10 MG/ML IJ SOLN
INTRAMUSCULAR | Status: DC | PRN
  Administered 2020-06-25: 10 mg via INTRAVENOUS

## 2020-06-25 SURGICAL SUPPLY — 1 items: fiducial marker ×3 IMPLANT

## 2020-06-25 NOTE — Anesthesia Procedure Notes (Signed)
Procedure Name: Intubation Date/Time: 06/25/2020 8:42 AM Performed by: Orlie Dakin, CRNA Pre-anesthesia Checklist: Patient identified, Emergency Drugs available, Suction available and Patient being monitored Patient Re-evaluated:Patient Re-evaluated prior to induction Oxygen Delivery Method: Circle system utilized Preoxygenation: Pre-oxygenation with 100% oxygen Induction Type: IV induction Ventilation: Mask ventilation without difficulty and Oral airway inserted - appropriate to patient size Laryngoscope Size: Sabra Heck and 3 Grade View: Grade II Tube type: Oral Tube size: 9.0 mm Number of attempts: 1 Airway Equipment and Method: Stylet Placement Confirmation: ETT inserted through vocal cords under direct vision,  positive ETCO2 and breath sounds checked- equal and bilateral Secured at: 23 cm Tube secured with: Tape Dental Injury: Teeth and Oropharynx as per pre-operative assessment

## 2020-06-25 NOTE — Discharge Instructions (Signed)
Flexible Bronchoscopy, Care After This sheet gives you information about how to care for yourself after your test. Your doctor may also give you more specific instructions. If you have problems or questions, contact your doctor. Follow these instructions at home: Eating and drinking  Do not eat or drink anything (not even water) for 2 hours after your test, or until your numbing medicine (local anesthetic) wears off.  When your numbness is gone and your cough and gag reflexes have come back, you may: ? Eat only soft foods. ? Slowly drink liquids.  The day after the test, go back to your normal diet. Driving  Do not drive for 24 hours if you were given a medicine to help you relax (sedative).  Do not drive or use heavy machinery while taking prescription pain medicine. General instructions   Take over-the-counter and prescription medicines only as told by your doctor.  Return to your normal activities as told. Ask what activities are safe for you.  Do not use any products that have nicotine or tobacco in them. This includes cigarettes and e-cigarettes. If you need help quitting, ask your doctor.  Keep all follow-up visits as told by your doctor. This is important. It is very important if you had a tissue sample (biopsy) taken. Get help right away if:  You have shortness of breath that gets worse.  You get light-headed.  You feel like you are going to pass out (faint).  You have chest pain.  You cough up: ? More than a little blood. ? More blood than before. Summary  Do not eat or drink anything (not even water) for 2 hours after your test, or until your numbing medicine wears off.  Do not use cigarettes. Do not use e-cigarettes.  Get help right away if you have chest pain.  Please call our office for any questions or concerns.  [phone].  This information is not intended to replace advice given to you by your health care provider. Make sure you discuss any  questions you have with your health care provider. Document Revised: 10/29/2017 Document Reviewed: 12/04/2016 Elsevier Patient Education  2020 [employer].

## 2020-06-25 NOTE — Transfer of Care (Signed)
Immediate Anesthesia Transfer of Care Note  Patient: Timothy Conway  Procedure(s) Performed: VIDEO BRONCHOSCOPY WITH ENDOBRONCHIAL NAVIGATION (N/A ) BRONCHIAL BRUSHINGS BRONCHIAL NEEDLE ASPIRATION BIOPSIES BRONCHIAL BIOPSIES BRONCHIAL WASHINGS FIDUCIAL MARKER PLACEMENT  Patient Location: PACU  Anesthesia Type:General  Level of Consciousness: awake and patient cooperative  Airway & Oxygen Therapy: Patient Spontanous Breathing and Patient connected to face mask oxygen  Post-op Assessment: Report given to RN and Post -op Vital signs reviewed and stable  Post vital signs: Reviewed and stable  Last Vitals:  Vitals Value Taken Time  BP 139/98 06/25/20 0955  Temp    Pulse 70 06/25/20 0957  Resp 16 06/25/20 0957  SpO2 100 % 06/25/20 0957  Vitals shown include unvalidated device data.  Last Pain:  Vitals:   06/25/20 0721  PainSc: 0-No pain         Complications: No complications documented.

## 2020-06-25 NOTE — Op Note (Signed)
Video Bronchoscopy with Electromagnetic Navigation Procedure Note  Date of Operation: 06/25/2020  Pre-op Diagnosis: Left upper lobe pulmonary nodule  Post-op Diagnosis: Same  Surgeon: Baltazar Apo  Assistants: None  Anesthesia: General endotracheal anesthesia  Operation: Flexible video fiberoptic bronchoscopy with electromagnetic navigation and biopsies.  Estimated Blood Loss: Minimal  Complications: None apparent  Indications and History: Timothy Conway is a 64 y.o. male with history of tobacco use.  He has a slowly enlarging left upper lobe groundglass pulmonary nodule that has been followed on serial CT scans of the chest.  Recommendation was made to achieve a tissue diagnosis via navigational bronchoscopy with biopsies.  The risks, benefits, complications, treatment options and expected outcomes were discussed with the patient.  The possibilities of pneumothorax, pneumonia, reaction to medication, pulmonary aspiration, perforation of a viscus, bleeding, failure to diagnose a condition and creating a complication requiring transfusion or operation were discussed with the patient who freely signed the consent.    Description of Procedure: The patient was seen in the Preoperative Area, was examined and was deemed appropriate to proceed.  The patient was taken to [facility] endoscopy room 2, identified as Timothy Conway and the procedure verified as Flexible Video Fiberoptic Bronchoscopy.  A Time Out was held and the above information confirmed.   Prior to the date of the procedure a high-resolution CT scan of the chest was performed. Utilizing [place] a virtual tracheobronchial tree was generated to allow the creation of distinct navigation pathways to the patient's parenchymal abnormalities. After being taken to the operating room general anesthesia was initiated and the patient  was orally intubated. The video fiberoptic bronchoscope was introduced via the  endotracheal tube and a general inspection was performed which showed normal airways throughout, no endobronchial lesions or abnormal secretions seen.  Endobronchial brushings were performed in the right mainstem bronchus to facilitate Percept[employer] genetic testing. The extendable working channel and locator guide were introduced into the bronchoscope. The distinct navigation pathways prepared prior to this procedure were then utilized to navigate to within 0.5 cm of patient's lesion identified on CT scan. The extendable working channel was secured into place and the locator guide was withdrawn. Under fluoroscopic guidance transbronchial needle brushings, transbronchial Wang needle biopsies, and transbronchial forceps biopsies were performed to be sent for cytology and pathology. A bronchioalveolar lavage was performed in the left upper lobe and sent for cytology.  Three fiducial markers were placed under fluoroscopic guidance triangulating the pulmonary nodule to facilitate radiation therapy should this become indicated going forward.  At the end of the procedure a general airway inspection was performed and there was no evidence of active bleeding. The bronchoscope was removed.  The patient tolerated the procedure well. There was no significant blood loss and there were no obvious complications. A post-procedural chest x-ray is pending.  Samples: 1. Transbronchial needle brushings from left upper lobe nodule 2. Transbronchial Wang needle biopsies from left upper lobe nodule 3. Transbronchial forceps biopsies from left upper lobe nodule 4. Bronchoalveolar lavage from left upper lobe  Plans:  The patient will be discharged from the PACU to home when recovered from anesthesia and after chest x-ray is reviewed. We will review the cytology, pathology and microbiology results with the patient when they become available. Outpatient followup will be with Dr. Lamonte Sakai.    Baltazar Apo, MD, PhD 06/25/2020, 9:40  AM [facility] [phone] or if no answer [phone]

## 2020-06-25 NOTE — H&P (Signed)
Timothy Conway is an 64 y.o. male.   Chief Complaint: Left upper lobe groundglass pulmonary nodule HPI: 64 year old smoker with COPD, history of thyroidectomy for papillary thyroid cancer and pulmonary nodular disease. We have been following a small but slowly enlarging left upper lobe groundglass pulmonary nodule. We had discussed possible surgical resection but he wanted to defer. He presents now for navigational bronchoscopy to achieve tissue diagnosis. Denies any new problems.  Past Medical History:  Diagnosis Date  . Anxiety   . COPD (chronic obstructive pulmonary disease) ([place])   . Depression   . Dry eye syndrome   . Dysrhythmia    hx of PAC's  . GERD (gastroesophageal reflux disease)   . Gout   . Hyperlipidemia   . Hypertension   . Hypothyroidism   . Thyroid cancer ([place])    2018    Past Surgical History:  Procedure Laterality Date  . ABDOMINAL SURGERY     hemmoraging ulcer  . CATARACT EXTRACTION Right   . COLONOSCOPY    . EYE SURGERY Right    x 4  between ages [age]  . THYROIDECTOMY N/A 08/27/2017   Procedure: TOTAL THYROIDECTOMY WITH LIMITED LYMPH NODE DISSECTION;  Surgeon: Armandina Gemma, MD;  Location: [facility] ORS;  Service: General;  Laterality: N/A;  . UPPER GI ENDOSCOPY      Family History  Adopted: Yes   Social History:  reports that he quit smoking about 13 months ago. His smoking use included cigarettes. He has a 40.00 pack-year smoking history. He has never used smokeless tobacco. He reports previous alcohol use. He reports previous drug use. Drug: Marijuana.  Allergies: No Known Allergies  Medications Prior to Admission  Medication Sig Dispense Refill  . albuterol (VENTOLIN HFA) 108 (90 Base) MCG/ACT inhaler Inhale 2 puffs into the lungs every 6 (six) hours as needed for wheezing or shortness of breath. 18 g 3  . alprazolam (XANAX) 2 MG tablet Take 2 mg by mouth 3 (three) times daily as needed (anxiety.).     Marland Kitchen ARIPiprazole (ABILIFY) 20 MG tablet Take 20 mg  by mouth at bedtime.     Marland Kitchen aspirin EC 81 MG tablet Take 81 mg by mouth daily. Swallow whole.    Marland Kitchen atenolol (TENORMIN) 100 MG tablet Take 100 mg by mouth 2 (two) times daily.    Marland Kitchen CALCIUM PO Take 1 tablet by mouth daily.    . Cholecalciferol (VITAMIN D) 2000 units tablet Take 2,000 Units by mouth daily.    . fenofibrate 160 MG tablet Take 160 mg by mouth daily with supper.     . fluticasone (FLONASE) 50 MCG/ACT nasal spray Place 2 sprays into both nostrils daily as needed for allergies or rhinitis.    . hydrocortisone 2.5 % cream Apply 1 application topically 2 (two) times daily as needed.    Marland Kitchen levothyroxine (SYNTHROID) 137 MCG tablet Take 1 tablet (137 mcg total) by mouth daily. 90 tablet 3  . Multiple Vitamin (MULTIVITAMIN WITH MINERALS) TABS tablet Take 1 tablet by mouth daily.    Marland Kitchen omeprazole (PRILOSEC) 20 MG capsule Take 20 mg by mouth daily before breakfast.     . rosuvastatin (CRESTOR) 10 MG tablet Take 10 mg by mouth daily.     Grant Ruts INHUB 250-50 MCG/DOSE AEPB Inhale 1 puff into the lungs daily.       No results found for this or any previous visit (from the past 48 hour(s)). No results found.  Review of Systems  Blood pressure Marland Kitchen)  135/66, pulse 56, temperature 97.7 F (36.5 C), resp. rate 20, height 6' (1.829 m), weight (!) 122.5 kg, SpO2 100 %. Physical Exam  Gen: Pleasant, well-nourished, in no distress,  normal affect  ENT: No lesions,  mouth clear,  oropharynx clear, no postnasal drip  Neck: No JVD, no stridor  Lungs: No use of accessory muscles, no crackles or wheezing on normal respiration, very soft wheeze on forced expiration  Cardiovascular: RRR, heart sounds normal, no murmur or gallops, no peripheral edema  Abdomen: soft and NT, no HSM,  BS normal  Musculoskeletal: No deformities, no cyanosis or clubbing  Neuro: alert, awake, non focal  Skin: Warm, no lesions or rash  Assessment/Plan Left upper lobe groundglass pulmonary nodule, slowly enlarging on  serial imaging, 0.4 x 1.1 cm on CT chest 06/17/2020. Patient presents today for navigational bronchoscopy, biopsies. Procedure discussed in full including risks and benefits. Patient understands. All questions answered. He agrees to proceed. No barriers identified.  Collene Gobble, MD 06/25/2020, 8:13 AM

## 2020-06-25 NOTE — Anesthesia Postprocedure Evaluation (Signed)
Anesthesia Post Note  Patient: Timothy Conway  Procedure(s) Performed: VIDEO BRONCHOSCOPY WITH ENDOBRONCHIAL NAVIGATION (N/A ) BRONCHIAL BRUSHINGS BRONCHIAL NEEDLE ASPIRATION BIOPSIES BRONCHIAL BIOPSIES BRONCHIAL WASHINGS FIDUCIAL MARKER PLACEMENT     Patient location during evaluation: PACU Anesthesia Type: General Level of consciousness: awake and alert Pain management: pain level controlled Vital Signs Assessment: post-procedure vital signs reviewed and stable Respiratory status: spontaneous breathing, nonlabored ventilation, respiratory function stable and patient connected to nasal cannula oxygen Cardiovascular status: blood pressure returned to baseline and stable Postop Assessment: no apparent nausea or vomiting Anesthetic complications: no   No complications documented.  Last Vitals:  Vitals:   06/25/20 1015 06/25/20 1022  BP:  (!) 143/76  Pulse: 69 67  Resp: 14 18  Temp:  36.6 C  SpO2: 98% 92%    Last Pain:  Vitals:   06/25/20 1022  PainSc: 0-No pain                 Kianna Billet

## 2020-06-26 ENCOUNTER — Encounter (HOSPITAL_COMMUNITY): Payer: Self-pay | Admitting: Emergency Medicine

## 2020-06-26 LAB — CYTOLOGY - NON PAP

## 2020-06-26 LAB — SURGICAL PATHOLOGY

## 2020-06-28 ENCOUNTER — Encounter (HOSPITAL_COMMUNITY): Payer: Self-pay | Admitting: Emergency Medicine

## 2020-06-28 ENCOUNTER — Telehealth: Payer: Self-pay | Admitting: Emergency Medicine

## 2020-06-28 DIAGNOSIS — C3492 Malignant neoplasm of unspecified part of left bronchus or lung: Secondary | ICD-10-CM

## 2020-06-28 NOTE — Telephone Encounter (Signed)
Reviewed the pathology with him.  Cytology and biopsies consistent with adenocarcinoma of the lung.  He wants to get his care in [facility] because driving to [place] would be difficult.  I will make a referral for him to be seen there to initiate treatment.

## 2020-07-02 ENCOUNTER — Telehealth: Payer: Self-pay | Admitting: Hematology & Oncology

## 2020-07-02 NOTE — Telephone Encounter (Signed)
lmom to inform of new patient appointment 8/9at 1030 am

## 2020-07-03 ENCOUNTER — Telehealth: Payer: Self-pay | Admitting: Hematology & Oncology

## 2020-07-03 NOTE — Telephone Encounter (Signed)
Returned call to patient advising him of appointment date/time/location.

## 2020-07-08 ENCOUNTER — Other Ambulatory Visit: Payer: Self-pay

## 2020-07-08 ENCOUNTER — Inpatient Hospital Stay: Payer: Medicare HMO | Attending: Hematology & Oncology | Admitting: Hematology & Oncology

## 2020-07-08 ENCOUNTER — Inpatient Hospital Stay: Payer: Medicare HMO

## 2020-07-08 ENCOUNTER — Encounter: Payer: Self-pay | Admitting: Hematology & Oncology

## 2020-07-08 ENCOUNTER — Encounter: Payer: Self-pay | Admitting: *Deleted

## 2020-07-08 VITALS — BP 129/76 | HR 69 | Temp 99.2°F | Resp 20 | Wt 271.0 lb

## 2020-07-08 DIAGNOSIS — R911 Solitary pulmonary nodule: Secondary | ICD-10-CM

## 2020-07-08 DIAGNOSIS — K219 Gastro-esophageal reflux disease without esophagitis: Secondary | ICD-10-CM | POA: Insufficient documentation

## 2020-07-08 DIAGNOSIS — E785 Hyperlipidemia, unspecified: Secondary | ICD-10-CM | POA: Insufficient documentation

## 2020-07-08 DIAGNOSIS — F419 Anxiety disorder, unspecified: Secondary | ICD-10-CM | POA: Insufficient documentation

## 2020-07-08 DIAGNOSIS — C3412 Malignant neoplasm of upper lobe, left bronchus or lung: Secondary | ICD-10-CM | POA: Diagnosis not present

## 2020-07-08 DIAGNOSIS — J449 Chronic obstructive pulmonary disease, unspecified: Secondary | ICD-10-CM | POA: Insufficient documentation

## 2020-07-08 DIAGNOSIS — E039 Hypothyroidism, unspecified: Secondary | ICD-10-CM | POA: Insufficient documentation

## 2020-07-08 DIAGNOSIS — C3490 Malignant neoplasm of unspecified part of unspecified bronchus or lung: Secondary | ICD-10-CM

## 2020-07-08 DIAGNOSIS — Z79899 Other long term (current) drug therapy: Secondary | ICD-10-CM | POA: Insufficient documentation

## 2020-07-08 DIAGNOSIS — I10 Essential (primary) hypertension: Secondary | ICD-10-CM | POA: Insufficient documentation

## 2020-07-08 DIAGNOSIS — Z87891 Personal history of nicotine dependence: Secondary | ICD-10-CM | POA: Diagnosis not present

## 2020-07-08 DIAGNOSIS — Z85850 Personal history of malignant neoplasm of thyroid: Secondary | ICD-10-CM | POA: Diagnosis not present

## 2020-07-08 DIAGNOSIS — Z7982 Long term (current) use of aspirin: Secondary | ICD-10-CM | POA: Insufficient documentation

## 2020-07-08 DIAGNOSIS — F329 Major depressive disorder, single episode, unspecified: Secondary | ICD-10-CM | POA: Insufficient documentation

## 2020-07-08 LAB — CBC WITH DIFFERENTIAL (CANCER CENTER ONLY)
Abs Immature Granulocytes: 0.05 10*3/uL (ref 0.00–0.07)
Basophils Absolute: 0.1 10*3/uL (ref 0.0–0.1)
Basophils Relative: 1 %
Eosinophils Absolute: 0.1 10*3/uL (ref 0.0–0.5)
Eosinophils Relative: 1 %
HCT: 49.3 % (ref 39.0–52.0)
Hemoglobin: 15.8 g/dL (ref 13.0–17.0)
Immature Granulocytes: 1 %
Lymphocytes Relative: 21 %
Lymphs Abs: 1.9 10*3/uL (ref 0.7–4.0)
MCH: 30.6 pg (ref 26.0–34.0)
MCHC: 32 g/dL (ref 30.0–36.0)
MCV: 95.4 fL (ref 80.0–100.0)
Monocytes Absolute: 0.6 10*3/uL (ref 0.1–1.0)
Monocytes Relative: 7 %
Neutro Abs: 6.2 10*3/uL (ref 1.7–7.7)
Neutrophils Relative %: 69 %
Platelet Count: 245 10*3/uL (ref 150–400)
RBC: 5.17 MIL/uL (ref 4.22–5.81)
RDW: 12.4 % (ref 11.5–15.5)
WBC Count: 8.8 10*3/uL (ref 4.0–10.5)
nRBC: 0 % (ref 0.0–0.2)

## 2020-07-08 LAB — CMP (CANCER CENTER ONLY)
ALT: 12 U/L (ref 0–44)
AST: 17 U/L (ref 15–41)
Albumin: 4.4 g/dL (ref 3.5–5.0)
Alkaline Phosphatase: 30 U/L — ABNORMAL LOW (ref 38–126)
Anion gap: 7 (ref 5–15)
BUN: 22 mg/dL (ref 8–23)
CO2: 31 mmol/L (ref 22–32)
Calcium: 9.2 mg/dL (ref 8.9–10.3)
Chloride: 105 mmol/L (ref 98–111)
Creatinine: 1.24 mg/dL (ref 0.61–1.24)
GFR, Est AFR Am: >60 mL/min (ref >60)
GFR, Estimated: >60 mL/min (ref >60)
Glucose, Bld: 113 mg/dL — ABNORMAL HIGH (ref 70–99)
Potassium: 4.7 mmol/L (ref 3.5–5.1)
Sodium: 143 mmol/L (ref 135–145)
Total Bilirubin: 0.5 mg/dL (ref 0.3–1.2)
Total Protein: 7 g/dL (ref 6.5–8.1)

## 2020-07-08 LAB — LACTATE DEHYDROGENASE: LDH: 205 U/L — ABNORMAL HIGH (ref 98–192)

## 2020-07-08 NOTE — Progress Notes (Signed)
Referral MD  Reason for Referral: Stage 1A2 (T1bN0M0) Adenocarcinoma of the LEFT upper lobe  Chief Complaint  Patient presents with  . New Patient (Initial Visit)  : I was told I have lung cancer.  HPI: Mr. Altschuler is a very nice 64 year old white male.  He is originally from [place].  He has been down in [place] for about 30 years.  He used to work in Theatre manager for [employer].  He retired I think in 2010.  He used to smoke.  He probably has about a 40 pack history of tobacco use.  He still smokes on occasion.  He is adopted.  As such there is no family history that he knows of.  He has been followed for this nodule in the left upper lobe.  He has had this probably for 3 years.  He actually had a CT scan done back in September 2017.  This showed an 11 mm left upper lobe groundglass opacity.  In August 2018, he had a PET scan done.  This showed a 9 x 6 mm left upper lobe groundglass appearance.  This had a very low SUV.  There is no metabolic adenopathy.  There was a 6 mm paraesophageal lymph node.  He has been followed for this.  Has been seen by pulmonary medicine.  He had his last CT scan done on 06/17/2020.  This showed growth in this left upper lobe nodule up to 1.4 x 1.1 cm.  He then underwent navigational bronchoscopy.  This was done on 06/25/2020.  The pathology report ([id number]) showed cells that were suspicious for malignancy.  A biopsy that was done had the pathology that showed a focal atypical proliferation suspicious for adenocarcinoma ([id number])  At this point, he was, referred to the [facility].  He has seen thoracic surgery.  He really does not want surgery for this.  Again he has had no cough or shortness of breath.  He does not have underlying COPD.  There is no bleeding.  He has had no headache.  He has had no dysphagia or odynophagia.  His last colonoscopy was about 3 years ago.  He has had no leg swelling.  He  has had his thyroid removed.  He is on Synthroid.  Overall, his performance status is ECOG 1.    Past Medical History:  Diagnosis Date  . Anxiety   . COPD (chronic obstructive pulmonary disease) ([place])   . Depression   . Dry eye syndrome   . Dysrhythmia    hx of PAC's  . GERD (gastroesophageal reflux disease)   . Gout   . Hyperlipidemia   . Hypertension   . Hypothyroidism   . Thyroid cancer ([place])    2018  :  Past Surgical History:  Procedure Laterality Date  . ABDOMINAL SURGERY     hemmoraging ulcer  . BRONCHIAL BIOPSY  06/25/2020   Procedure: BRONCHIAL BIOPSIES;  Surgeon: Collene Gobble, MD;  Location: [facility] ENDOSCOPY;  Service: Pulmonary;;  . BRONCHIAL BRUSHINGS  06/25/2020   Procedure: BRONCHIAL BRUSHINGS;  Surgeon: Collene Gobble, MD;  Location: [facility] ENDOSCOPY;  Service: Pulmonary;;  . BRONCHIAL NEEDLE ASPIRATION BIOPSY  06/25/2020   Procedure: BRONCHIAL NEEDLE ASPIRATION BIOPSIES;  Surgeon: Collene Gobble, MD;  Location: [facility] ENDOSCOPY;  Service: Pulmonary;;  . BRONCHIAL WASHINGS  06/25/2020   Procedure: BRONCHIAL WASHINGS;  Surgeon: Collene Gobble, MD;  Location: [facility] ENDOSCOPY;  Service: Pulmonary;;  . CATARACT EXTRACTION Right   .  COLONOSCOPY    . EYE SURGERY Right    x 4  between ages 48-13  . FIDUCIAL MARKER PLACEMENT  06/25/2020   Procedure: FIDUCIAL MARKER PLACEMENT;  Surgeon: Collene Gobble, MD;  Location: [facility] ENDOSCOPY;  Service: Pulmonary;;  . THYROIDECTOMY N/A 08/27/2017   Procedure: TOTAL THYROIDECTOMY WITH LIMITED LYMPH NODE DISSECTION;  Surgeon: Armandina Gemma, MD;  Location: [facility] ORS;  Service: General;  Laterality: N/A;  . UPPER GI ENDOSCOPY    . VIDEO BRONCHOSCOPY WITH ENDOBRONCHIAL NAVIGATION N/A 06/25/2020   Procedure: VIDEO BRONCHOSCOPY WITH ENDOBRONCHIAL NAVIGATION;  Surgeon: Collene Gobble, MD;  Location: [facility] ENDOSCOPY;  Service: Pulmonary;  Laterality: N/A;  :   Current Outpatient Medications:  .  albuterol (VENTOLIN HFA) 108 (90 Base) MCG/ACT inhaler,  Inhale 2 puffs into the lungs every 6 (six) hours as needed for wheezing or shortness of breath., Disp: 18 g, Rfl: 3 .  alprazolam (XANAX) 2 MG tablet, Take 2 mg by mouth 3 (three) times daily as needed (anxiety.). , Disp: , Rfl:  .  ARIPiprazole (ABILIFY) 20 MG tablet, Take 20 mg by mouth at bedtime. , Disp: , Rfl:  .  aspirin EC 81 MG tablet, Take 81 mg by mouth daily. Swallow whole., Disp: , Rfl:  .  atenolol (TENORMIN) 100 MG tablet, Take 100 mg by mouth 2 (two) times daily., Disp: , Rfl:  .  CALCIUM PO, Take 1 tablet by mouth daily., Disp: , Rfl:  .  Cholecalciferol (VITAMIN D) 2000 units tablet, Take 2,000 Units by mouth daily., Disp: , Rfl:  .  fenofibrate 160 MG tablet, Take 160 mg by mouth daily with supper. , Disp: , Rfl:  .  fluticasone (FLONASE) 50 MCG/ACT nasal spray, Place 2 sprays into both nostrils daily as needed for allergies or rhinitis., Disp: , Rfl:  .  hydrocortisone 2.5 % cream, Apply 1 application topically 2 (two) times daily as needed., Disp: , Rfl:  .  levothyroxine (SYNTHROID) 137 MCG tablet, Take 1 tablet (137 mcg total) by mouth daily., Disp: 90 tablet, Rfl: 3 .  Multiple Vitamin (MULTIVITAMIN WITH MINERALS) TABS tablet, Take 1 tablet by mouth daily., Disp: , Rfl:  .  omeprazole (PRILOSEC) 20 MG capsule, Take 20 mg by mouth daily before breakfast. , Disp: , Rfl:  .  rosuvastatin (CRESTOR) 10 MG tablet, Take 10 mg by mouth daily. , Disp: , Rfl:  .  [place] INHUB 250-50 MCG/DOSE AEPB, Inhale 1 puff into the lungs daily. , Disp: , Rfl: :  :  No Known Allergies:  Family History  Adopted: Yes  :  Social History   Socioeconomic History  . Marital status: Single    Spouse name: Not on file  . Number of children: Not on file  . Years of education: Not on file  . Highest education level: Not on file  Occupational History  . Not on file  Tobacco Use  . Smoking status: Former Smoker    Packs/day: 1.00    Years: 40.00    Pack years: 40.00    Types: Cigarettes     Quit date: 05/2019    Years since quitting: 1.1  . Smokeless tobacco: Never Used  Vaping Use  . Vaping Use: Never used  Substance and Sexual Activity  . Alcohol use: Not Currently    Alcohol/week: 0.0 standard drinks    Comment: Used to drink a lot -moderate - quit 2020  . Drug use: Not Currently    Types: Marijuana  Comment: last use years ago  . Sexual activity: Not Currently  Other Topics Concern  . Not on file  Social History Narrative  . Not on file   Social Determinants of Health   Financial Resource Strain:   . Difficulty of Paying Living Expenses:   Food Insecurity:   . Worried About Charity fundraiser in the Last Year:   . Arboriculturist in the Last Year:   Transportation Needs:   . Film/video editor (Medical):   Marland Kitchen Lack of Transportation (Non-Medical):   Physical Activity:   . Days of Exercise per Week:   . Minutes of Exercise per Session:   Stress:   . Feeling of Stress :   Social Connections:   . Frequency of Communication with Friends and Family:   . Frequency of Social Gatherings with Friends and Family:   . Attends Religious Services:   . Active Member of Clubs or Organizations:   . Attends Archivist Meetings:   Marland Kitchen Marital Status:   Intimate Partner Violence:   . Fear of Current or Ex-Partner:   . Emotionally Abused:   Marland Kitchen Physically Abused:   . Sexually Abused:   :  Review of Systems  Constitutional: Negative.   HENT: Negative.   Eyes: Negative.   Respiratory: Negative.   Cardiovascular: Negative.   Gastrointestinal: Negative.   Genitourinary: Negative.   Musculoskeletal: Negative.   Skin: Negative.   Neurological: Negative.   Endo/Heme/Allergies: Negative.   Psychiatric/Behavioral: Negative.      Exam:  This is a well-developed and well-nourished white male in no obvious distress.  Vital signs show a temperature of 99.2.  Pulse 69.  Blood pressure 129/76.  Weight is 271 pounds.  Head and neck exam shows no scleral  icterus.  There is no conjunctival inflammation.  He has no adenopathy in the neck.  Lungs are clear bilaterally.  Cardiac exam regular rate and rhythm with no murmurs, rubs or bruits.  Abdomen is soft.  He has good bowel sounds.  There is no fluid wave.  There is no palpable liver or spleen tip.  Back exam shows no tenderness over the spine, ribs or hips.  Extremities shows no clubbing, cyanosis or edema.  He actually may have some chronic mild pitting edema in his lower legs.  He has some osteoarthritic changes in his joints.  Skin exam shows no rashes, ecchymoses or petechia.  Neurological exam shows no focal neurological deficits.   @IPVITALS @   Recent Labs    07/08/20 1104  WBC 8.8  HGB 15.8  HCT 49.3  PLT 245   Recent Labs    07/08/20 1104  NA 143  K 4.7  CL 105  CO2 31  GLUCOSE 113*  BUN 22  CREATININE 1.24  CALCIUM 9.2    Blood smear review: None  Pathology: See above    Assessment and Plan: Mr. Camarena is a very nice 64 year old white male.  I would have to think that this is going to be a very early stage low-grade adenocarcinoma of the left upper lung.  He has had this for 3-4 years.  It has grown slowly.  Pathology does show a malignancy that appears to be an adenocarcinoma.  I think that it would be very reasonable to consider stereotactic radiosurgery for this lesion.  It is not that big.  Is an area that should be very sensible for radiosurgery.  I think another PET scan would not be a bad  idea.  We can make sure that there is nothing else that we are seeing.  I would be shocked if there was.  Mr. Baillie comes in with a friend.  His friend is very very nice.  His friend was very diligent and took quite a few notes.  We have I spent about an hour with both of them.  I went over my recommendations.  I reviewed x-rays.  He is in agreement with trying radiosurgery.  He wants to try to avoid invasive surgery.  We will speak with radiation oncology regarding  this.  I will plan to get him back to see [place] probably in about 2 months or so.  Depending on when he has the radiosurgery, this will dictate when her next set of scans are done.

## 2020-07-09 ENCOUNTER — Encounter: Payer: Self-pay | Admitting: *Deleted

## 2020-07-09 ENCOUNTER — Telehealth: Payer: Self-pay | Admitting: Hematology & Oncology

## 2020-07-09 NOTE — Progress Notes (Signed)
Initial RN Navigator Patient Visit  Name: Timothy Conway Date of Referral : 06/28/20 Diagnosis: Adenocarcinoma of the LEFT upper lobe  Met with patient prior to their visit with MD. [name] patient "Your Patient Navigator" handout which explains my role, areas in which I am able to help, and all the contact information for myself and the office. Also gave patient MD and Navigator business card. Reviewed with patient the general overview of expected course after initial diagnosis and time frame for all steps to be completed.  New patient packet given to patient which includes: orientation to office and staff; campus directory; education on My Chart and Advance Directives; and patient centered education on lung cancer  Patient completed visit with Dr. Marin Olp  PET Scan - will schedule when PA obtained Referral to RadOnc for SBRT, referral order placed  Patient understands all follow up procedures and expectations. They have my number to reach out for any further clarification or additional needs.

## 2020-07-09 NOTE — Telephone Encounter (Signed)
Called LMVM and mailed calendar per 8/9 los

## 2020-07-09 NOTE — Progress Notes (Signed)
Scheduled PET scan for 07/16/20  Called and reviewed PET Scan appointment with patient. Time, date and location given. Also educated patient to PET scan prep including NPO after midnight. Nothing to drink except water. Low carb meal the night before. Mailed patient PET scan instruction form that also included review of appointment details.  Informed patient that radiation oncology would reach out to scheduled consultation, but that it would be scheduled for after PET scan. He understood.  Oncology Nurse Navigator Documentation  Oncology Nurse Navigator Flowsheets 07/09/2020  Abnormal Finding Date -  Confirmed Diagnosis Date -  Diagnosis Status -  Navigator Follow Up Date: 07/16/2020  Navigator Follow Up Reason: Radiology  Navigator Location [facility]  Referral Date to RadOnc/MedOnc -  Navigator Encounter Type Appt/Treatment Plan Review;Telephone  Telephone Outgoing Call;Appt Confirmation/Clarification  Patient Visit Type MedOnc  Treatment Phase Pre-Tx/Tx Discussion  Barriers/Navigation Needs Coordination of Care;Education;Language/Communication  Education Other  Interventions Coordination of Care;Education;Psycho-Social Support  Acuity Level 3-Moderate Needs (3-4 Barriers Identified)  Coordination of Care Appts;Radiology  Education Method Verbal;Written  Support Groups/Services Friends and Family  Time Spent with Patient 30

## 2020-07-16 ENCOUNTER — Other Ambulatory Visit: Payer: Self-pay

## 2020-07-16 ENCOUNTER — Encounter (HOSPITAL_COMMUNITY)
Admission: RE | Admit: 2020-07-16 | Discharge: 2020-07-16 | Disposition: A | Discharge status: Home / Self Care | Payer: Medicare HMO | Source: Ambulatory Visit | Attending: Hematology & Oncology | Admitting: Hematology & Oncology

## 2020-07-16 DIAGNOSIS — E890 Postprocedural hypothyroidism: Secondary | ICD-10-CM | POA: Diagnosis not present

## 2020-07-16 DIAGNOSIS — C3412 Malignant neoplasm of upper lobe, left bronchus or lung: Secondary | ICD-10-CM | POA: Diagnosis not present

## 2020-07-16 DIAGNOSIS — Z85850 Personal history of malignant neoplasm of thyroid: Secondary | ICD-10-CM | POA: Insufficient documentation

## 2020-07-16 DIAGNOSIS — I700 Atherosclerosis of aorta: Secondary | ICD-10-CM | POA: Diagnosis not present

## 2020-07-16 DIAGNOSIS — J0100 Acute maxillary sinusitis, unspecified: Secondary | ICD-10-CM | POA: Diagnosis not present

## 2020-07-16 DIAGNOSIS — C3490 Malignant neoplasm of unspecified part of unspecified bronchus or lung: Secondary | ICD-10-CM | POA: Insufficient documentation

## 2020-07-16 LAB — GLUCOSE, CAPILLARY: Glucose-Capillary: 117 mg/dL — ABNORMAL HIGH (ref 70–99)

## 2020-07-16 IMAGING — PT NM PET TUM IMG INITIAL (PI) SKULL BASE T - THIGH
7 of 8 series · 21 of 25 positions shown · non-contrast
Comparison: [DATE] and CT of the chest of [DATE]

CLINICAL DATA: Subsequent treatment strategy for non-small cell
lung cancer.

EXAM:
NUCLEAR MEDICINE PET SKULL BASE TO THIGH
TECHNIQUE: 13.5 mCi F-18 FDG was injected intravenously. Full-ring PET imaging
was performed from the skull base to thigh after the radiotracer. CT
data was obtained and used for attenuation correction and anatomic
localization.
Fasting blood glucose: 117 mg/dl

[Series 3: pet sk_thigh ac · axial · 5.0mm · 4.07mm/px · z∈[-1560,-560]mm · 4 of 251 slices shown]
[im 1/251]
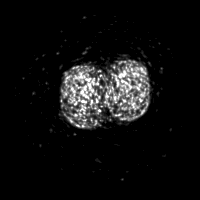
[im 63/251]
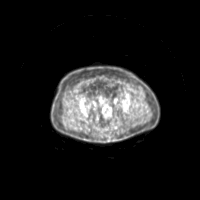
[im 126/251]
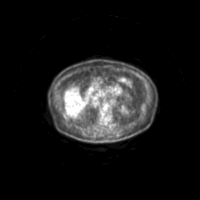
[im 251/251]
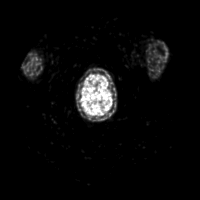

[Series 4: ct sk_thigh 5.0 b31f · axial · 5.0mm · 0.98mm/px · z∈[-1560,-812]mm · 4 of 251 slices shown]
[im 1/251]
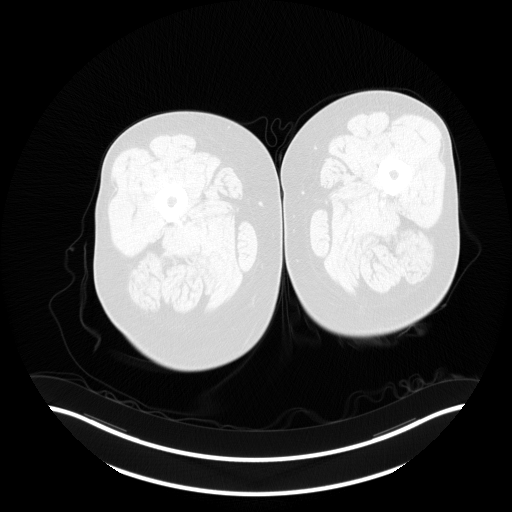
[im 63/251]
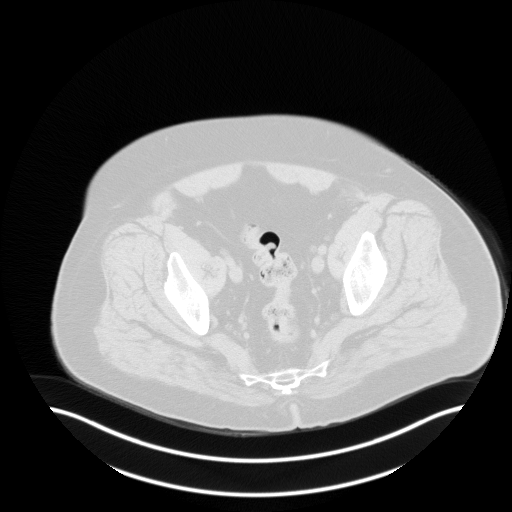
[im 126/251]
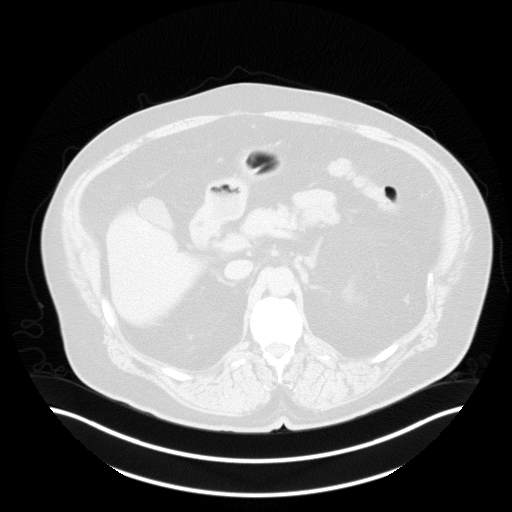
[im 188/251]
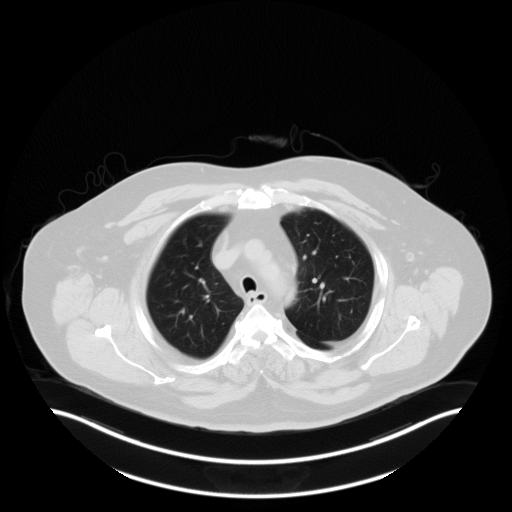

[Series 5: pet sk_thigh nac · axial · 5.0mm · 4.07mm/px · z∈[-1560,-560]mm · 5 of 251 slices shown]
[im 1/251]
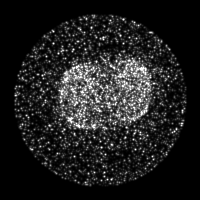
[im 63/251]
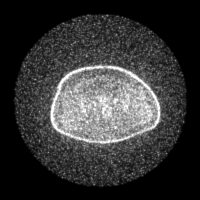
[im 126/251]
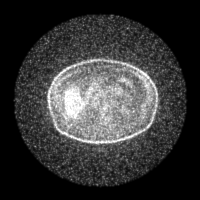
[im 188/251]
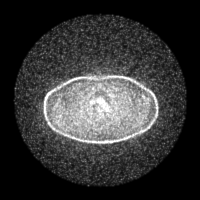
[im 251/251]
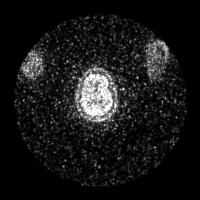

[Series 603: range-ac ct sk_thigh 5.0 hd_fov-tra-<alpha range> · 5 of 245 slices shown]
[im 1/245]
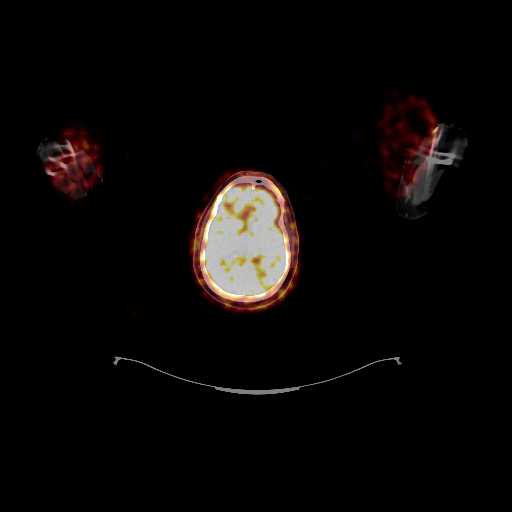
[im 62/245]
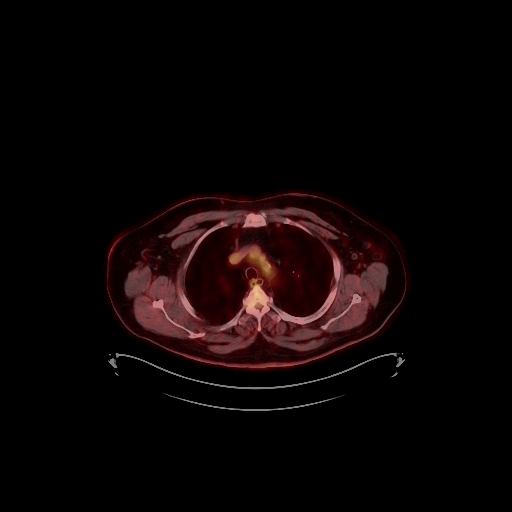
[im 123/245]
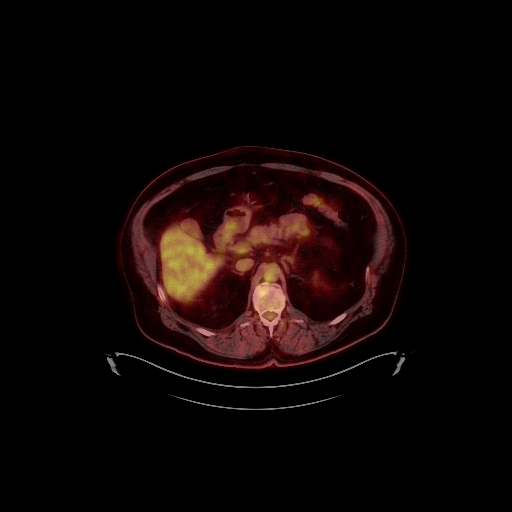
[im 184/245]
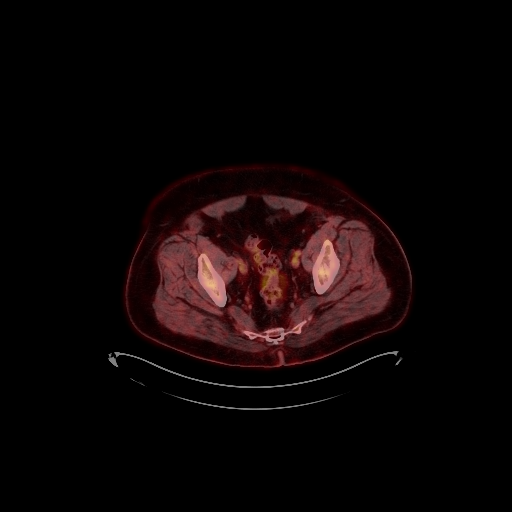
[im 245/245]
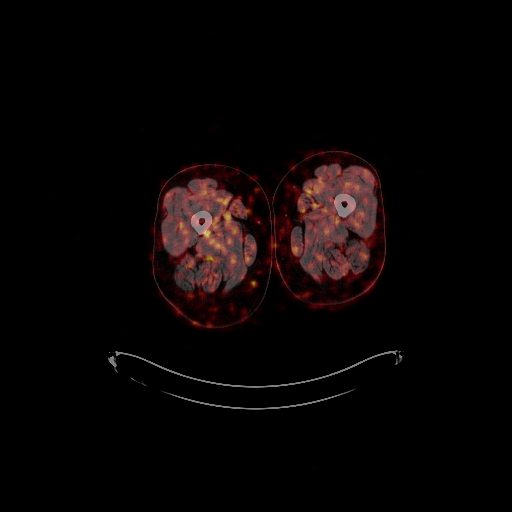

[Series 604: range-ac ct sk_thigh 5.0 hd_fov-cor-<alpha range> · 1 of 116 slices shown]
[im 116/116]
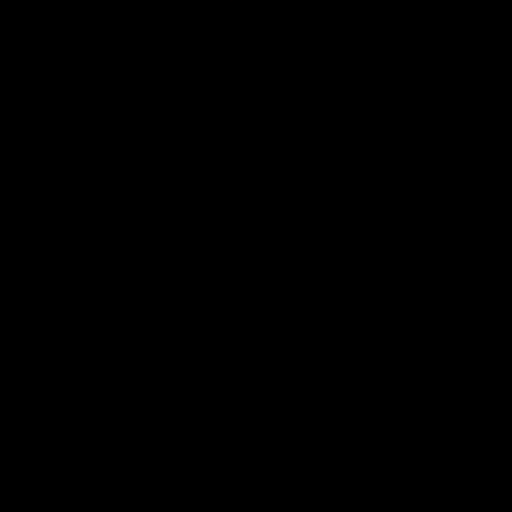

[Series 605: mip range 5 · coronal · 2.07mm/px · 1 of 32 slices shown]
[im 1/32]
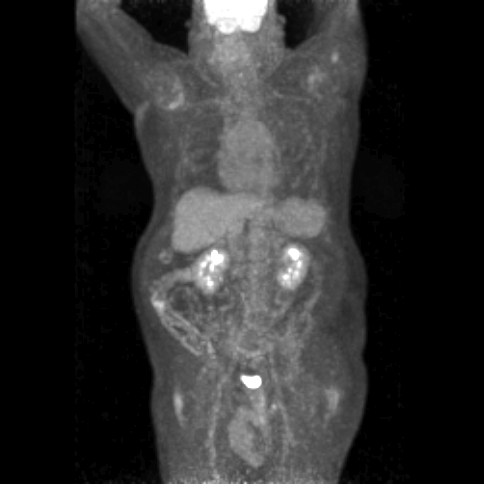

[Series 8080: results mm oncology reading · 1.0mm · 0.50mm/px · 1 of 4 slices shown]
[im 1/4]
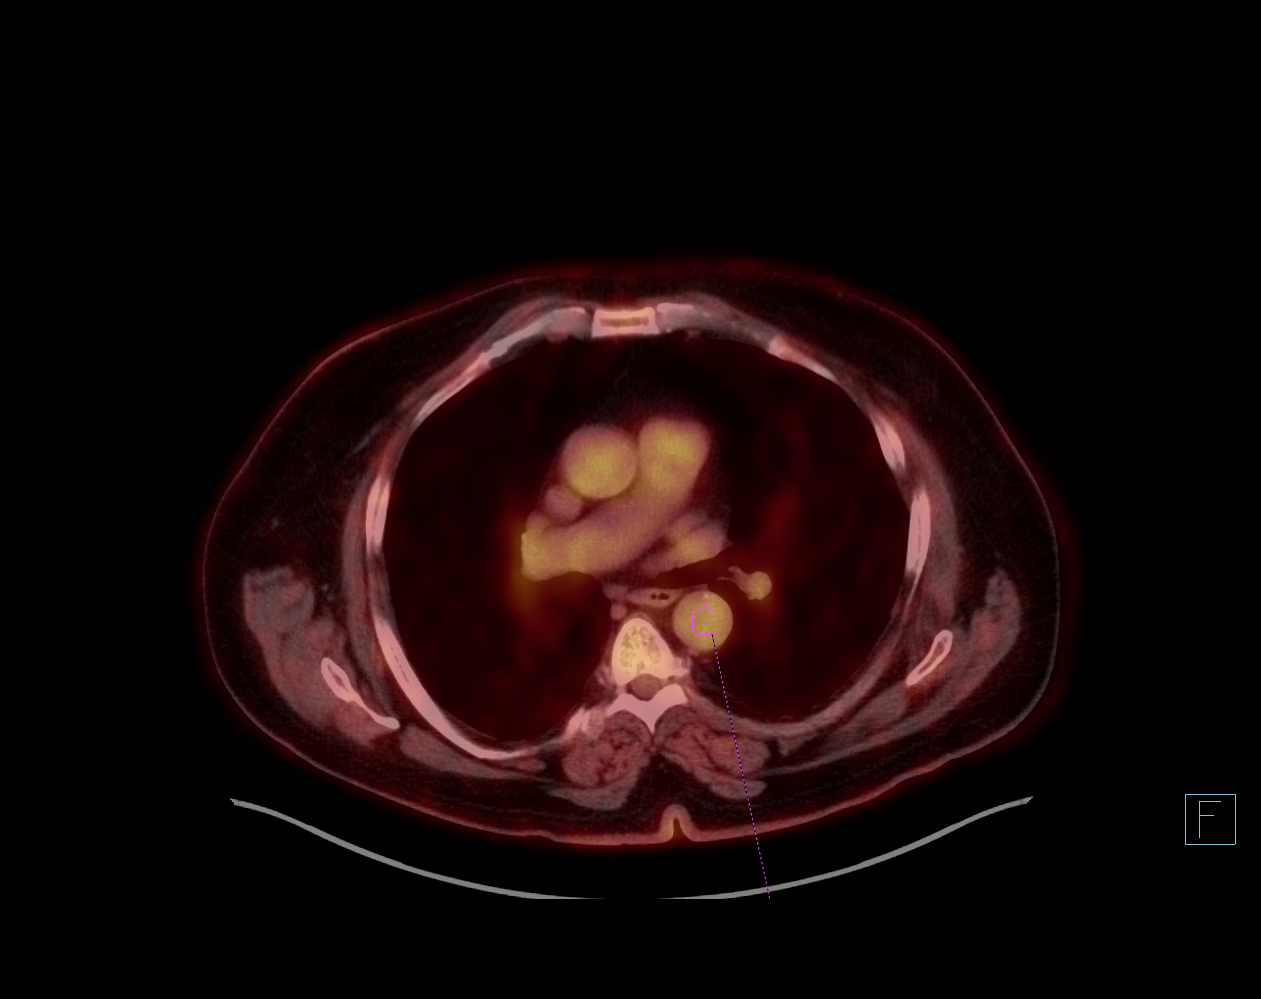

[21 of 25 positions shown; findings below may reference images not displayed]

FINDINGS: Mediastinal blood pool activity: SUV max

Liver activity: SUV max NA

NECK: Mild asymmetric hypermetabolic changes at the RIGHT base of
tongue and soft palate (SUVmax = 4.8)

Prevertebral asymmetric increased metabolic activity without
visible, corresponding lesion and without clear signs of related
linear muscular activity to explain this finding (image 26, series
4) (SUVmax = 4.3 no adenopathy in the neck.)

Incidental CT findings: Post thyroidectomy. Mild RIGHT maxillary
sinus disease.

CHEST: Area of concern in the LEFT upper lobe with radiopaque marker
in place with subsequent streak artifact, potentially accentuating
the adjacent nodule and increase in ground-glass potentially
postprocedural in the area of concern. The discrete nodule seen on
the prior study is not as well visualized. There may be some
associated volume loss in the area measures approximately 1.7 x
cm on today's exam which is larger but given additional findings
this is of uncertain significance. (SUVmax = 2.1) no hypermetabolic
lymph nodes in the chest.

Incidental CT findings: Basilar scarring in the chest particularly
on the LEFT. Airways are patent.

ABDOMEN/PELVIS: No abnormal hypermetabolic activity within the
liver, pancreas, adrenal glands, or spleen. No hypermetabolic lymph
nodes in the abdomen or pelvis. Adrenal glands are normal.

Incidental CT findings: Liver and gallbladder are unremarkable.

Mildly lobulated spleen.  Pancreatic contour is normal.

No hydronephrosis or nephrolithiasis. No acute gastrointestinal
process. The appendix is normal. Colonic diverticulosis.
Atherosclerotic changes of the abdominal aorta extending in the
iliac vessels.

SKELETON: No focal hypermetabolic activity to suggest skeletal
metastasis.

Incidental CT findings: Marked cervical spine degenerative changes
with large anterior osteophytes.
IMPRESSION: 1. Asymmetric activity in the oropharynx at base of tongue and in
the prevertebral musculature anterior to the upper cervical spine
without visible lesion and in the setting of misregistration
artifact, may be physiologic and or artifactual. However, given the
patient's history of thyroid cancer CT of the neck may be helpful
for further assessment.
2. Mildly increased metabolic activity but less than mediastinal
blood pool activity within the area of concern in the LEFT upper
lobe. Ground-glass changes are increased in terms of size but this
may relate to recent procedural changes and placement of fiducial
markers. No mediastinal adenopathy or signs of distant metastatic
disease.

## 2020-07-16 MED ORDER — FLUDEOXYGLUCOSE F - 18 (FDG) INJECTION
13.5000 | Freq: Once | INTRAVENOUS | Status: AC | PRN
  Administered 2020-07-16: 13.5 via INTRAVENOUS

## 2020-07-17 ENCOUNTER — Other Ambulatory Visit: Payer: Self-pay | Admitting: Family

## 2020-07-17 ENCOUNTER — Encounter: Payer: Self-pay | Admitting: *Deleted

## 2020-07-17 DIAGNOSIS — C3490 Malignant neoplasm of unspecified part of unspecified bronchus or lung: Secondary | ICD-10-CM

## 2020-07-17 NOTE — Progress Notes (Signed)
Reviewed results of PET scan with Dr Marin Olp. Order will be placed for CT neck per the recommendation. Message sent to insurance specialist to obtain PA.  Called patient to notify him of results and share the recommendation made by the radiologist. Explained that we had to wait for PA, but that once obtained I will call to schedule the CT for him. He was appreciative of the information and has an understanding of the timeline.   Will follow for PA and scheduling.  Oncology Nurse Navigator Documentation  Oncology Nurse Navigator Flowsheets 07/17/2020  Abnormal Finding Date -  Confirmed Diagnosis Date -  Diagnosis Status -  Navigator Follow Up Date: -  Navigator Follow Up Reason: -  Navigator Location [facility]  Referral Date to RadOnc/MedOnc -  Navigator Encounter Type Telephone;Diagnostic Results  Telephone Diagnostic Results;Outgoing Call  Patient Visit Type MedOnc  Treatment Phase Pre-Tx/Tx Discussion  Barriers/Navigation Needs Coordination of Care;Education;Language/Communication  Education Other  Interventions Education;Psycho-Social Support  Acuity Level 3-Moderate Needs (3-4 Barriers Identified)  Referrals -  Coordination of Care -  Education Method Verbal  Support Groups/Services Friends and Family  Time Spent with Patient 30

## 2020-07-17 NOTE — Progress Notes (Signed)
Oncology Nurse Navigator Documentation  Oncology Nurse Navigator Flowsheets 07/17/2020  Abnormal Finding Date -  Confirmed Diagnosis Date -  Diagnosis Status -  Navigator Follow Up Date: 07/24/2020  Navigator Follow Up Reason: Radiology  Navigator Location [facility]  Referral Date to RadOnc/MedOnc -  Navigator Encounter Type Appt/Treatment Plan Review;Other:  Telephone -  Patient Visit Type -  Treatment Phase -  Barriers/Navigation Needs -  Education -  Interventions -  Acuity -  Referrals -  Coordination of Care -  Education Method -  Support Groups/Services -  Time Spent with Patient 15

## 2020-07-24 ENCOUNTER — Ambulatory Visit (HOSPITAL_BASED_OUTPATIENT_CLINIC_OR_DEPARTMENT_OTHER)
Admission: RE | Admit: 2020-07-24 | Discharge: 2020-07-24 | Disposition: A | Discharge status: Home / Self Care | Payer: Medicare HMO | Source: Ambulatory Visit | Attending: Family | Admitting: Family

## 2020-07-24 ENCOUNTER — Other Ambulatory Visit: Payer: Self-pay

## 2020-07-24 ENCOUNTER — Encounter (HOSPITAL_BASED_OUTPATIENT_CLINIC_OR_DEPARTMENT_OTHER): Payer: Self-pay

## 2020-07-24 DIAGNOSIS — C760 Malignant neoplasm of head, face and neck: Secondary | ICD-10-CM | POA: Diagnosis not present

## 2020-07-24 DIAGNOSIS — C3490 Malignant neoplasm of unspecified part of unspecified bronchus or lung: Secondary | ICD-10-CM | POA: Insufficient documentation

## 2020-07-24 DIAGNOSIS — I6522 Occlusion and stenosis of left carotid artery: Secondary | ICD-10-CM | POA: Diagnosis not present

## 2020-07-24 DIAGNOSIS — E890 Postprocedural hypothyroidism: Secondary | ICD-10-CM | POA: Diagnosis not present

## 2020-07-24 DIAGNOSIS — C051 Malignant neoplasm of soft palate: Secondary | ICD-10-CM | POA: Diagnosis not present

## 2020-07-24 IMAGING — CT CT NECK W/ CM
3 of 4 series · 13 of 35 positions shown, 16 images · IV contrast (Omnipaque)
Comparison: PET CT [DATE]

CLINICAL DATA: Head and neck cancer. Malignant neoplasm soft
palate. History thyroid cancer [D3]

EXAM:
CT NECK WITH CONTRAST
TECHNIQUE: Multidetector CT imaging of the neck was performed using the
standard protocol following the bolus administration of intravenous
contrast.
CONTRAST:  75mL OMNIPAQUE IOHEXOL 300 MG/ML  SOLN

[Series 3: axial neck · axial · 0.58mm/px · z∈[-274,-82]mm · 5 of 146 slices shown, 7 images]
[im 25/146  soft-tissue]
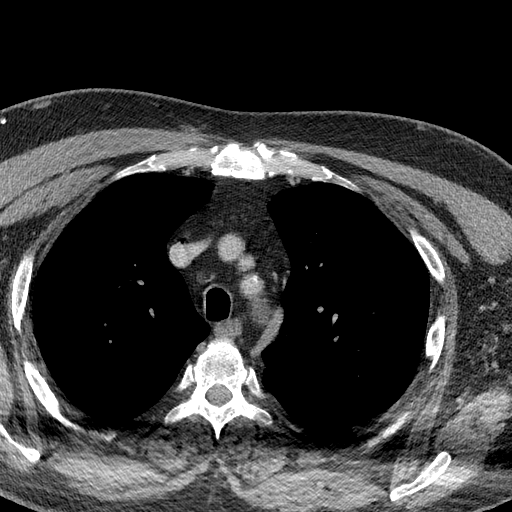
[im 25/146  bone]
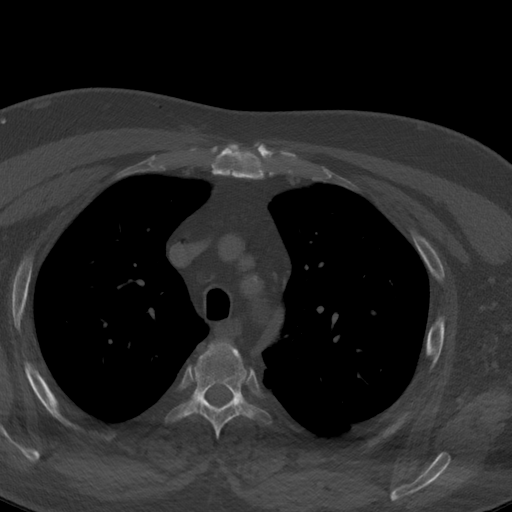
[im 49/146  bone]
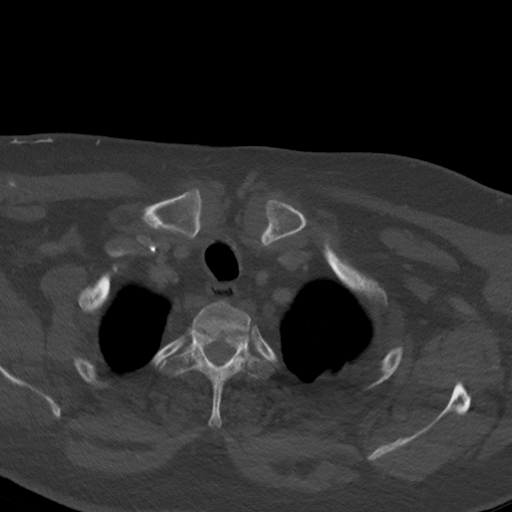
[im 73/146  bone]
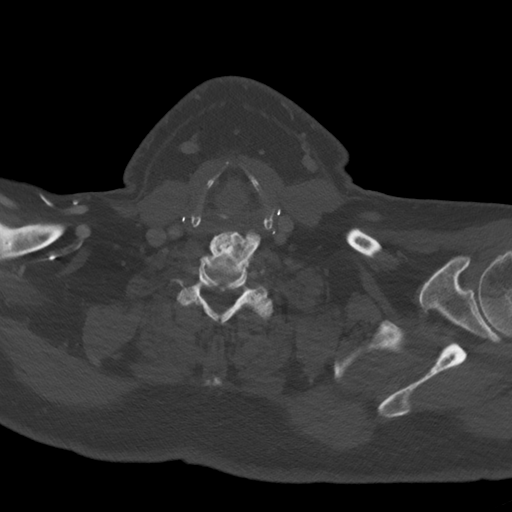
[im 97/146  bone]
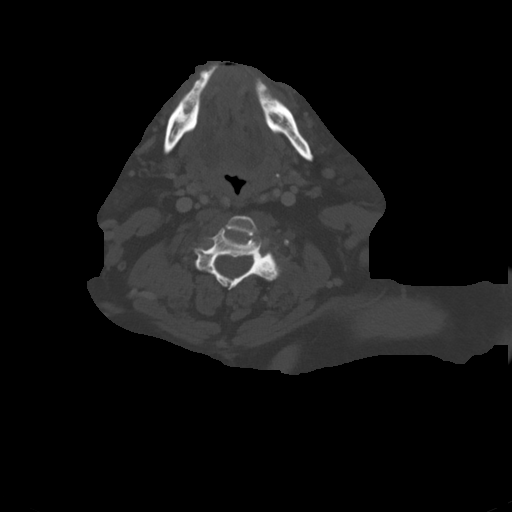
[im 121/146  soft-tissue]
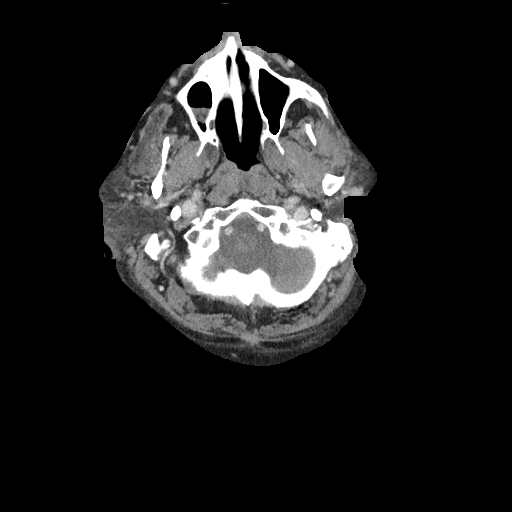
[im 121/146  bone]
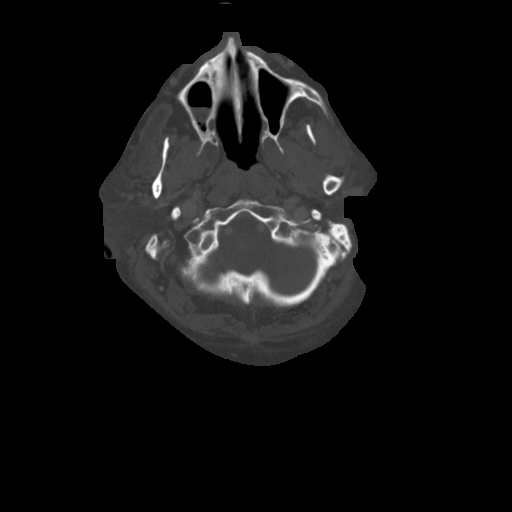

[Series 4: sag neck · sagittal · 0.61mm/px · 5 of 113 slices shown, 6 images]
[im 38/113  bone]
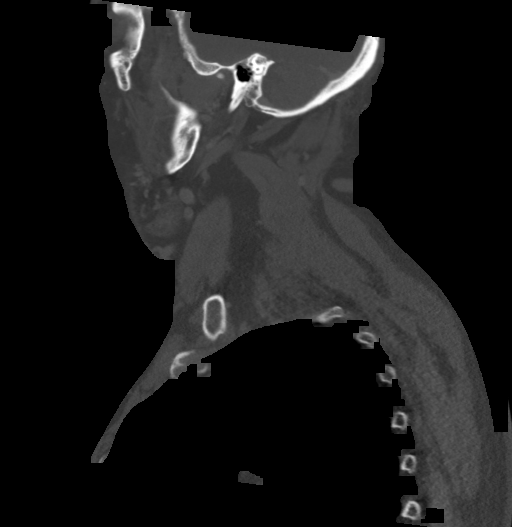
[im 47/113  bone]
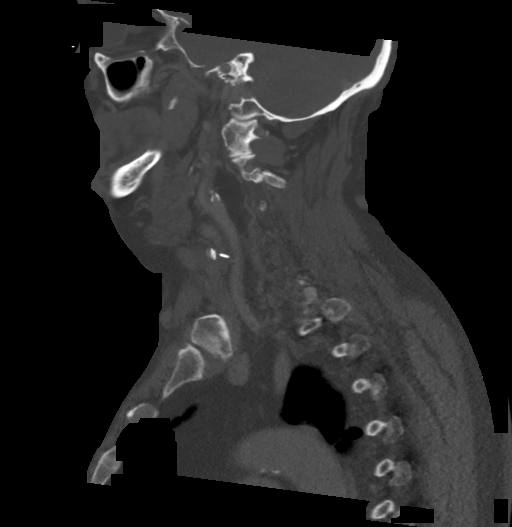
[im 57/113  soft-tissue]
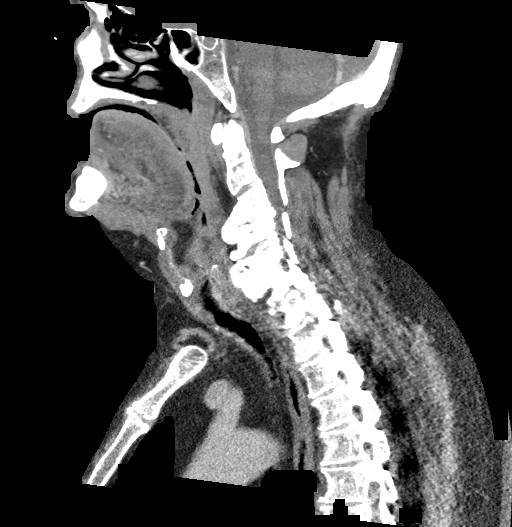
[im 57/113  bone]
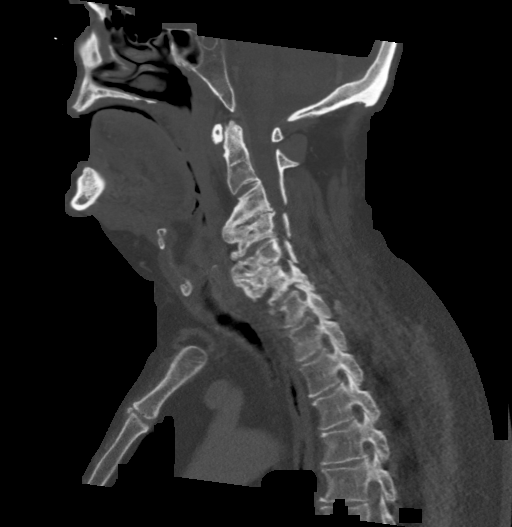
[im 66/113  bone]
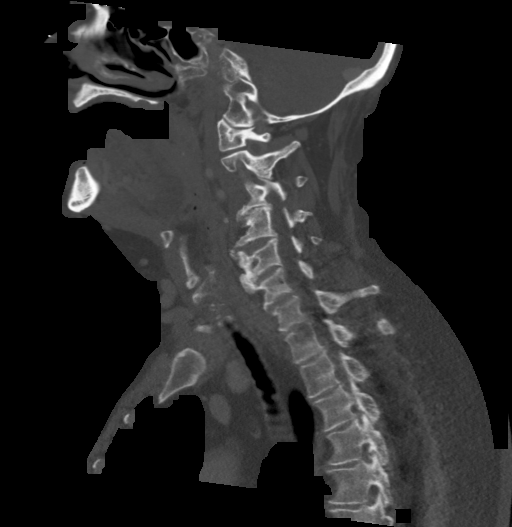
[im 75/113  bone]
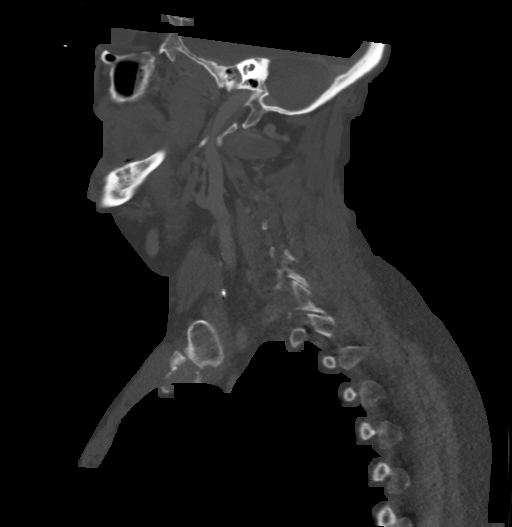

[Series 5: cor neck · coronal · 0.47mm/px · 3 of 147 slices shown]
[im 30/147  bone]
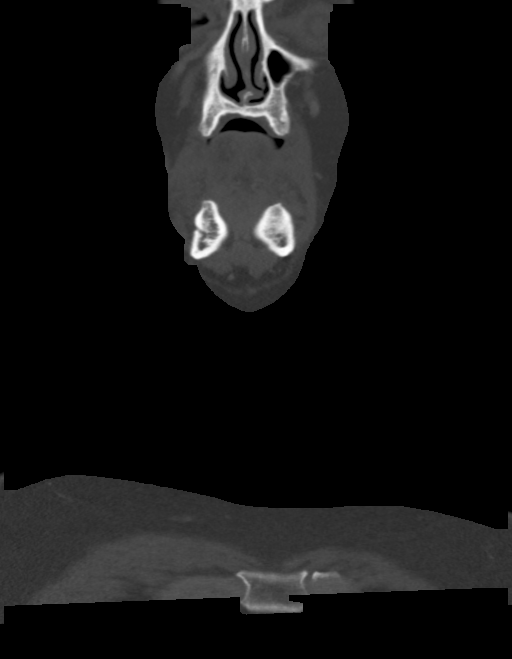
[im 59/147  bone]
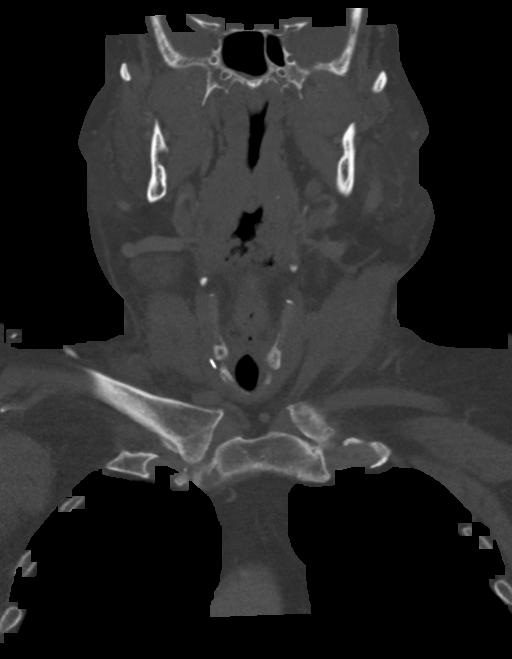
[im 88/147  bone]
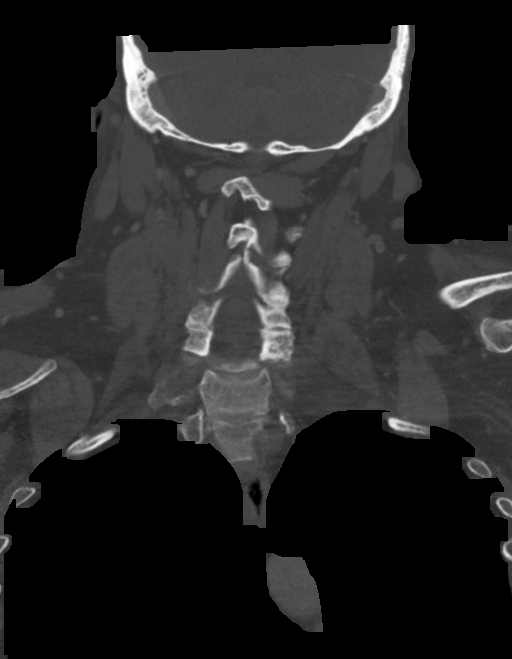

[13 of 35 positions shown; findings below may reference images not displayed]

FINDINGS: Pharynx and larynx: Fullness of the soft palate on the right.
Correlate with direct visualization. Mild uptake at the base of
tongue on the right on PET is noted without mass lesion in this
area. Recommend mucosal evaluation. Bilateral tonsilliths.

Salivary glands: No inflammation, mass, or stone.

Thyroid: Bilateral thyroidectomy. No residual thyroid tissue or
mass.

Lymph nodes: No enlarged lymph nodes in the neck.

Vascular: Atherosclerotic disease at the carotid bifurcation
bilaterally. Moderate to severe stenosis proximal left internal
carotid artery. Right carotid patent. Normal venous enhancement

Limited intracranial: Negative

Visualized orbits: Right cataract extraction.  No orbital lesion.

Mastoids and visualized paranasal sinuses: Mucosal edema paranasal
sinuses without air-fluid level. Bony thickening of the maxillary
sinus bilaterally. Bilateral mastoid effusion.

Skeleton: Cervical spondylosis. Prominent anterior osteophytes C3
through C7 with anterior displacement of the proximal esophagus.

Upper chest: 17 mm sub solid density left upper lobe slightly
increased in size from recent CT of [DATE]. Adjacent metal
foreign body is present in the left upper lobe anterior to the sub
solid density related to recent bronchoscopy and biopsy.

Other: None
IMPRESSION: 1. There is fullness of the tonsil bilaterally without mass lesion.
Bilateral tonsilliths suggesting chronic inflammation. No adenopathy
in neck
2. Fullness of the soft palate on the right. Correlate with direct
visualization.
3. Postop thyroidectomy for cancer.  No recurrent mass.
4. 17 mm sub solid nodule left upper lobe. This appears slightly
larger which may be due to recent bronchoscopy and biopsy. Surgical
clip adjacent to the nodule.
5. Carotid artery atherosclerotic disease with moderate to severe
stenosis proximal left internal carotid artery.
6. Prominent anterior osteophytes C3 through C7 with mass-effect on
the proximal esophagus.

## 2020-07-24 MED ORDER — IOHEXOL 300 MG/ML  SOLN
100.0000 mL | Freq: Once | INTRAMUSCULAR | Status: AC | PRN
  Administered 2020-07-24: 75 mL via INTRAVENOUS

## 2020-07-25 ENCOUNTER — Encounter: Payer: Self-pay | Admitting: *Deleted

## 2020-07-25 NOTE — Progress Notes (Signed)
Oncology Nurse Navigator Documentation  Oncology Nurse Navigator Flowsheets 07/25/2020  Abnormal Finding Date -  Confirmed Diagnosis Date -  Diagnosis Status -  Navigator Follow Up Date: 09/06/2020  Navigator Follow Up Reason: Follow-up Appointment  Navigator Location [facility]  Referral Date to RadOnc/MedOnc -  Navigator Encounter Type Scan Review  Telephone -  Patient Visit Type MedOnc  Treatment Phase Pre-Tx/Tx Discussion  Barriers/Navigation Needs Coordination of Care;Education;Language/Communication  Education -  Interventions None Required  Acuity Level 3-Moderate Needs (3-4 Barriers Identified)  Referrals -  Coordination of Care -  Education Method -  Support Groups/Services Friends and Family  Time Spent with Patient 15

## 2020-07-30 NOTE — Progress Notes (Signed)
Patient here for a consult with Dr. Sondra Come.  Reason for Referral: Stage 1A2 (T1bN0M0) Adenocarcinoma of the LEFT upper lobe  Chief Complaint  Patient presents with  . New Patient (Initial Visit)  : I was told I have lung cancer.  HPI: Timothy Conway is a very nice 64 year old white male.  He is originally from [place].  He has been down in [place] for about 30 years.  He used to work in Theatre manager for [employer].  He retired I think in 2010.  He used to smoke.  He probably has about a 40 pack history of tobacco use.  He still smokes on occasion.  He is adopted.  As such there is no family history that he knows of.  He has been followed for this nodule in the left upper lobe.  He has had this probably for 3 years.  He actually had a CT scan done back in September 2017.  This showed an 11 mm left upper lobe groundglass opacity.  In August 2018, he had a PET scan done.  This showed a 9 x 6 mm left upper lobe groundglass appearance.  This had a very low SUV.  There is no metabolic adenopathy.  There was a 6 mm paraesophageal lymph node.  He has been followed for this.  Has been seen by pulmonary medicine.  He had his last CT scan done on 06/17/2020.  This showed growth in this left upper lobe nodule up to 1.4 x 1.1 cm.  He then underwent navigational bronchoscopy.  This was done on 06/25/2020.  The pathology report ([id number]) showed cells that were suspicious for malignancy.  A biopsy that was done had the pathology that showed a focal atypical proliferation suspicious for adenocarcinoma ([id number])  At this point, he was, referred to the [facility].  He has seen thoracic surgery.  He really does not want surgery for this.  Again he has had no cough or shortness of breath.  He does not have underlying COPD.  There is no bleeding.  He has had no headache.  He has had no dysphagia or odynophagia.  His last colonoscopy was about 3  years ago.  He has had no leg swelling.  He has had his thyroid removed.  He is on Synthroid.  Overall, his performance status is ECOG 1.         Past Medical History:  Diagnosis Date  . Anxiety   . COPD (chronic obstructive pulmonary disease) ([place])   . Depression   . Dry eye syndrome   . Dysrhythmia    hx of PAC's  . GERD (gastroesophageal reflux disease)   . Gout   . Hyperlipidemia   . Hypertension   . Hypothyroidism   . Thyroid cancer ([place])    2018       :  Past Surgical History:  Procedure Laterality Date  . ABDOMINAL SURGERY     hemmoraging ulcer  . BRONCHIAL BIOPSY  06/25/2020   Procedure: BRONCHIAL BIOPSIES;  Surgeon: Collene Gobble, MD;  Location: [facility] ENDOSCOPY;  Service: Pulmonary;;  . BRONCHIAL BRUSHINGS  06/25/2020   Procedure: BRONCHIAL BRUSHINGS;  Surgeon: Collene Gobble, MD;  Location[facility] ENDOSCOPY;  Service: Pulmonary;;  . BRONCHIAL NEEDLE ASPIRATION BIOPSY  06/25/2020   Procedure: BRONCHIAL NEEDLE ASPIRATION BIOPSIES;  Surgeon: Collene Gobble, MD;  Location: [facility] ENDOSCOPY;  Service: Pulmonary;;  . BRONCHIAL WASHINGS  06/25/2020   Procedure: BRONCHIAL WASHINGS;  Surgeon: Baltazar Apo  S, MD;  Location: [facility];  Service: Pulmonary;;  . CATARACT EXTRACTION Right   . COLONOSCOPY    . EYE SURGERY Right    x 4  between ages 81-13  . FIDUCIAL MARKER PLACEMENT  06/25/2020   Procedure: FIDUCIAL MARKER PLACEMENT;  Surgeon: Collene Gobble, MD;  Location: [facility] ENDOSCOPY;  Service: Pulmonary;;  . THYROIDECTOMY N/A 08/27/2017   Procedure: TOTAL THYROIDECTOMY WITH LIMITED LYMPH NODE DISSECTION;  Surgeon: Armandina Gemma, MD;  Location: [facility] ORS;  Service: General;  Laterality: N/A;  . UPPER GI ENDOSCOPY    . VIDEO BRONCHOSCOPY WITH ENDOBRONCHIAL NAVIGATION N/A 06/25/2020   Procedure: VIDEO BRONCHOSCOPY WITH ENDOBRONCHIAL NAVIGATION;  Surgeon: Collene Gobble, MD;  Location: [facility] ENDOSCOPY;  Service: Pulmonary;  Laterality: N/A;   : Assessment and Plan: Timothy Conway is a very nice 64 year old white male.  I would have to think that this is going to be a very early stage low-grade adenocarcinoma of the left upper lung.  He has had this for 3-4 years.  It has grown slowly.  Pathology does show a malignancy that appears to be an adenocarcinoma.  I think that it would be very reasonable to consider stereotactic radiosurgery for this lesion.  It is not that big.  Is an area that should be very sensible for radiosurgery.  I think another PET scan would not be a bad idea.  We can make sure that there is nothing else that we are seeing.  I would be shocked if there was.  Timothy Conway comes in with a friend.  His friend is very very nice.  His friend was very diligent and took quite a few notes.  We have I spent about an hour with both of them.  I went over my recommendations.  I reviewed x-rays.  He is in agreement with trying radiosurgery.  He wants to try to avoid invasive surgery.  We will speak with radiation oncology regarding this.  I will plan to get him back to see [place] probably in about 2 months or so.  Depending on when he has the radiosurgery, this will dictate when her next set of scans are done.         Electronically signed by [name], MD at 07/08/2020 4:05 PM  Past/Anticipated interventions by medical oncology, if any: none  Signs/Symptoms  Weight changes, if any: has lost 22 lbs over 3 months on purpose  Respiratory complaints, if any: sob,cough has COPD  Hemoptysis, if any: no  Pain issues, if any:  Pain in lower back  SAFETY ISSUES:  Prior radiation? no  Pacemaker/ICD? no  Possible current pregnancy? n/a  Is the patient on methotrexate? no  Current Complaints / other details: Patient has a friend with him.  BP 125/66 (BP Location: Left Arm, Patient Position: Sitting)   Pulse (!) 52   Temp 98 F (36.7 C) (Oral)   Resp 20   Ht 6' (1.829 m)   Wt 274 lb 4 oz (124.4 kg)    SpO2 97%   BMI 37.19 kg/m   Wt Readings from Last 3 Encounters:  07/31/20 274 lb 4 oz (124.4 kg)  07/08/20 271 lb (122.9 kg)  06/25/20 (!) 270 lb (122.5 kg)

## 2020-07-31 ENCOUNTER — Ambulatory Visit
Admission: RE | Admit: 2020-07-31 | Discharge: 2020-07-31 | Disposition: A | Discharge status: Home / Self Care | Payer: Medicare HMO | Source: Ambulatory Visit | Attending: Radiation Oncology | Admitting: Radiation Oncology

## 2020-07-31 ENCOUNTER — Encounter: Payer: Self-pay | Admitting: Radiation Oncology

## 2020-07-31 ENCOUNTER — Other Ambulatory Visit: Payer: Self-pay

## 2020-07-31 DIAGNOSIS — R911 Solitary pulmonary nodule: Secondary | ICD-10-CM

## 2020-07-31 DIAGNOSIS — J449 Chronic obstructive pulmonary disease, unspecified: Secondary | ICD-10-CM | POA: Diagnosis not present

## 2020-07-31 DIAGNOSIS — Z79899 Other long term (current) drug therapy: Secondary | ICD-10-CM | POA: Insufficient documentation

## 2020-07-31 DIAGNOSIS — Z7982 Long term (current) use of aspirin: Secondary | ICD-10-CM | POA: Insufficient documentation

## 2020-07-31 DIAGNOSIS — K219 Gastro-esophageal reflux disease without esophagitis: Secondary | ICD-10-CM | POA: Insufficient documentation

## 2020-07-31 DIAGNOSIS — Z85850 Personal history of malignant neoplasm of thyroid: Secondary | ICD-10-CM | POA: Diagnosis not present

## 2020-07-31 DIAGNOSIS — E039 Hypothyroidism, unspecified: Secondary | ICD-10-CM | POA: Insufficient documentation

## 2020-07-31 DIAGNOSIS — I700 Atherosclerosis of aorta: Secondary | ICD-10-CM | POA: Diagnosis not present

## 2020-07-31 DIAGNOSIS — I10 Essential (primary) hypertension: Secondary | ICD-10-CM | POA: Diagnosis not present

## 2020-07-31 DIAGNOSIS — F418 Other specified anxiety disorders: Secondary | ICD-10-CM | POA: Diagnosis not present

## 2020-07-31 DIAGNOSIS — E785 Hyperlipidemia, unspecified: Secondary | ICD-10-CM | POA: Diagnosis not present

## 2020-07-31 DIAGNOSIS — Z87891 Personal history of nicotine dependence: Secondary | ICD-10-CM | POA: Insufficient documentation

## 2020-07-31 DIAGNOSIS — C3412 Malignant neoplasm of upper lobe, left bronchus or lung: Secondary | ICD-10-CM | POA: Diagnosis not present

## 2020-07-31 NOTE — Progress Notes (Signed)
Radiation Oncology         [phone] ________________________________  Initial Outpatient Consultation  Name: Timothy Conway MRN: [id number]  Date: 07/31/2020  DOB: [birth year]  DX:AJOINOM, Timothy Conway  Timothy Napoleon, Conway   REFERRING PHYSICIAN: [name], Conway  DIAGNOSIS: The encounter diagnosis was Pulmonary nodule, left.  Stage IA (T1b, N0, M0) adenocarcinoma of the left upper lobe  HISTORY OF PRESENT ILLNESS::Timothy Conway is a 64 y.o. male who is seen as a courtesy of Dr. Marin Conway for an opinion concerning radiation therapy as part of management for his recently diagnosed lung cancer. Today, he is accompanied by by a good friend. The patient underwent a chest CT scan on 11/27/2015 for persistent cough and shortness of breath. Results showed a 6 mm nodule noted in the left upper lobe. Of note, it also showed a small penetrating aortic ulcer arising form the transverse aortic arch. He was being followed by Dr. Trula Conway for the arch. Serial CT scans continued to show a slowly enlarged left upper lobe ground-glass nodule. The patient was then referred to Timothy Conway and was seen in consultation on 07/05/2017. At that time, it was recommended that he proceed with primary resection after PET scan.   PET scan on 07/13/2017 showed a 9 x 6 mm left upper lobe ground-glass density nodule that did not have accentuated hypermetabolic activity. However, given the clear increase in size of the process since 2016, low-grade malignancy was a distinct possibility. Of note, there was hypermetabolic activity associated with stranding around a lobulation of omental adipose tissue just below the right hepatic lobe margin, which could have possibly represented an inflammatory response to an omental infarct. Finally, there was a rim calcified 1.1 cm left thyroid lobe nodule that was hypermetabolic and a 2.0 cm soft tissue density in the cecum that may have corresponded to a focus of hypermetabolic activity  on the PET data with SUV up to 15.4. A cecal polyp was a distinct possibility.  Given the above findings, the patient underwent a thryoid ultrasound on 07/30/2017 that showed a 1.3 cm mid left thyroid nodule that demonstrated peripheral calcification and lobulated contour. The nodule met criteria for fine-needle aspiration. There was also a small 0.5 cm right superior nodule that did not meet criteria for biopsy or further follow-up.  Definitive treatment/diagnostic testing for the left upper lobe nodule was deferred in light of thyroid and cecal abnormalities.  The patient underwent a total thyroidectomy with limited central compartment lymph node dissection on 08/27/2017 (Dr. Harlow Conway). Pathology from the procedure revealed classic type papillary thryoid carcinoma with negative resection margins. Three central compartment lymph nodes were biopsied and all were negative for carcinoma. The patient has since been followed by Dr. Cruzita Conway.   Repeat chest CT scan on 01/04/2018 showed a 9.5 mm ground-glass nodule in the left upper lobe. There were no other worrisome lung lesions. There was no mediastinal or hilar mass and there was no adenopathy. Hepatic cysts were stable.  The patient followed-up with Timothy Conway on 02/04/2018, during which time it was recommended that he proceed with bronchoscopy and primary lung surgery after being cleared by cardiothoracic surgery. The patient was seen in consultation with Dr. Roxan Conway on 02/28/2018. They discussed treatment options including surgical resection and stereotactic radiation. The patient was more interested in pursuing surgery, however he reported feeling tired and run down and wanted to wait until after he felt better to proceed.  Repeat chest CT scan on 05/10/2018 showed continued  stability of the left upper lobe 1.3 cm ground-glass pulmonary nodule. Once again, the patient and Dr. Roxan Conway discussed biopsy and stereotactic radiation versus surgical  resection. It was agreed that they would first proceed with navigational bronchoscopy and fiducial placement. However, he wanted to wait until after his birthday in [birth year]. The procedure was scheduled for 06/06/2018. Unfortunately, the patient never underwent the procedure.  The patient was seen in follow-up with Timothy Conway on 05/08/2020 and agreed to have a repeat chest CT scan to evaluate the pulmonary nodule as well as the aortic ulcer. CTA of chest on 05/23/2020 showed a continued increase in size of the ill-defined ground-glass nodule within the left upper lobe that then measured 2 cm as compared to 1.4 cm previously. It also showed a stable and uncomplicated fusiform ectasia of the ascending thoracic aorta that measured 37 mm in diameter and was unchanged when compared to 2016 examination. Additionally, there was also an unchanged tiny (approximately 1.1 cm) contained penetrating atherosclerotic ulcer involving the anterior aspect of the aortic arch that was stable when compared to 2017 examination.   Chest CT scan on 06/17/2020 showed a stable appearance of the ground-glass attenuating nodule with the left upper lobe.  The patient opted to undergo a video bronchoscopy with electromagnetic navigation on 06/25/2020 that was performed by Timothy Conway. Pathology from the procedure revealed focal atypical proliferation that was suspicious for adenocarcinoma with lepidic pattern. Cytology from the procedure showed suspicion for malignancy in both the left upper lobe brushing and fine needle aspiration. However, no malignant cells were identified in the final microscopic diagnosis.   The patient was referred to Dr. Marin Conway and was seen in consultation on 07/08/2020. It was recommended that he proceed with stereotactic radiosurgery for the lesion in addition to a repeat PET scan.  PET scan on 07/16/2020 showed asymmetric activity in the oropharynx at the base of the tongue and in the prevertebral musculature  anterior to the upper cervical spine without visible lesion. It also showed mildly increased metabolic activity but less than mediastinal blood pool activity within the area of concern in the left upper lobe. Ground-glass changes had increased in terms of size, but that could have been related to procedural changes and placement of fiducial markers. There was no mediastinal adenopathy nor were there signs of distant metastatic disease.   Soft tissue neck CT scan on 07/24/2020 showed fullness of the tonsils bilaterally without mass lesions. Bilateral tonsilliths suggested chronic inflammation. There was no adenopathy in the neck. Additionally, there was fullness of the soft palate on the right. No recurrent mass was seen status post thyroidectomy. Finally, there was a 17 mm sub-solid nodule in the left upper lobe that appeared slightly larger (possibly due to recent bronchoscopy and biopsy), carotid artery atherosclerotic disease with moderate to severe stenosis of the proximal left internal carotid artery, and prominent anterior osteophytes C3 - C7 with mass-effect on the proximal esophagus.  PREVIOUS RADIATION THERAPY: No  PAST MEDICAL HISTORY:  Past Medical History:  Diagnosis Date  . Anxiety   . COPD (chronic obstructive pulmonary disease) ([place])   . Depression   . Dry eye syndrome   . Dysrhythmia    hx of PAC's  . GERD (gastroesophageal reflux disease)   . Gout   . Hyperlipidemia   . Hypertension   . Hypothyroidism   . Thyroid cancer ([place])    2018    PAST SURGICAL HISTORY: Past Surgical History:  Procedure Laterality Date  . ABDOMINAL SURGERY  hemmoraging ulcer  . BRONCHIAL BIOPSY  06/25/2020   Procedure: BRONCHIAL BIOPSIES;  Surgeon: Collene Gobble, Conway;  Location: [facility] ENDOSCOPY;  Service: Pulmonary;;  . BRONCHIAL BRUSHINGS  06/25/2020   Procedure: BRONCHIAL BRUSHINGS;  Surgeon: Collene Gobble, Conway;  Location: [facility] ENDOSCOPY;  Service: Pulmonary;;  . BRONCHIAL NEEDLE ASPIRATION  BIOPSY  06/25/2020   Procedure: BRONCHIAL NEEDLE ASPIRATION BIOPSIES;  Surgeon: Collene Gobble, Conway;  Location: [facility] ENDOSCOPY;  Service: Pulmonary;;  . BRONCHIAL WASHINGS  06/25/2020   Procedure: BRONCHIAL WASHINGS;  Surgeon: Collene Gobble, Conway;  Location: [facility] ENDOSCOPY;  Service: Pulmonary;;  . CATARACT EXTRACTION Right   . COLONOSCOPY    . EYE SURGERY Right    x 4  between ages 28-13  . FIDUCIAL MARKER PLACEMENT  06/25/2020   Procedure: FIDUCIAL MARKER PLACEMENT;  Surgeon: Collene Gobble, Conway;  Location: [facility] ENDOSCOPY;  Service: Pulmonary;;  . THYROIDECTOMY N/A 08/27/2017   Procedure: TOTAL THYROIDECTOMY WITH LIMITED LYMPH NODE DISSECTION;  Surgeon: Armandina Gemma, Conway;  Location: [facility] ORS;  Service: General;  Laterality: N/A;  . UPPER GI ENDOSCOPY    . VIDEO BRONCHOSCOPY WITH ENDOBRONCHIAL NAVIGATION N/A 06/25/2020   Procedure: VIDEO BRONCHOSCOPY WITH ENDOBRONCHIAL NAVIGATION;  Surgeon: Collene Gobble, Conway;  Location: [place] ENDOSCOPY;  Service: Pulmonary;  Laterality: N/A;    FAMILY HISTORY:  Family History  Adopted: Yes    SOCIAL HISTORY:  Social History   Tobacco Use  . Smoking status: Former Smoker    Packs/day: 1.00    Years: 40.00    Pack years: 40.00    Types: Cigarettes    Quit date: 05/2019    Years since quitting: 1.2  . Smokeless tobacco: Never Used  Vaping Use  . Vaping Use: Never used  Substance Use Topics  . Alcohol use: Not Currently    Alcohol/week: 0.0 standard drinks    Comment: Used to drink a lot -moderate - quit 2020  . Drug use: Not Currently    Types: Marijuana    Comment: last use years ago    ALLERGIES: No Known Allergies  MEDICATIONS:  Current Outpatient Medications  Medication Sig Dispense Refill  . albuterol (VENTOLIN HFA) 108 (90 Base) MCG/ACT inhaler Inhale 2 puffs into the lungs every 6 (six) hours as needed for wheezing or shortness of breath. 18 g 3  . alprazolam (XANAX) 2 MG tablet Take 2 mg by mouth 3 (three) times daily as needed (anxiety.).      Marland Kitchen ARIPiprazole (ABILIFY) 20 MG tablet Take 20 mg by mouth at bedtime.     Marland Kitchen aspirin EC 81 MG tablet Take 81 mg by mouth daily. Swallow whole.    Marland Kitchen atenolol (TENORMIN) 100 MG tablet Take 100 mg by mouth 2 (two) times daily.    Marland Kitchen CALCIUM PO Take 1 tablet by mouth daily.    . Cholecalciferol (VITAMIN D) 2000 units tablet Take 2,000 Units by mouth daily.    . fenofibrate 160 MG tablet Take 160 mg by mouth daily with supper.     . fluticasone (FLONASE) 50 MCG/ACT nasal spray Place 2 sprays into both nostrils daily as needed for allergies or rhinitis.    . hydrocortisone 2.5 % cream Apply 1 application topically 2 (two) times daily as needed.    Marland Kitchen levothyroxine (SYNTHROID) 137 MCG tablet Take 1 tablet (137 mcg total) by mouth daily. 90 tablet 3  . Multiple Vitamin (MULTIVITAMIN WITH MINERALS) TABS tablet Take 1 tablet by mouth daily.    Marland Kitchen omeprazole (  PRILOSEC) 20 MG capsule Take 20 mg by mouth daily before breakfast.     . rosuvastatin (CRESTOR) 10 MG tablet Take 10 mg by mouth daily.     Grant Ruts INHUB 250-50 MCG/DOSE AEPB Inhale 1 puff into the lungs daily.      No current facility-administered medications for this encounter.    REVIEW OF SYSTEMS:  A 10+ POINT REVIEW OF SYSTEMS WAS OBTAINED including neurology, dermatology, psychiatry, cardiac, respiratory, lymph, extremities, GI, GU, musculoskeletal, constitutional, reproductive, HEENT.  Patient denies any pain within the chest area significant cough or hemoptysis.  He denies any significant breathing issues.   PHYSICAL EXAM:  height is 6' (1.829 m) and weight is 274 lb 4 oz (124.4 kg). His oral temperature is 98 F (36.7 C). His blood pressure is 125/66 and his pulse is 52 (abnormal). His respiration is 20 and oxygen saturation is 97%.   General: Alert and oriented, in no acute distress HEENT: Head is normocephalic. Extraocular movements are intact. Oropharynx is clear, edentulous Neck: Neck is supple, no palpable cervical or supraclavicular  lymphadenopathy. Heart: Regular in rate and rhythm with no murmurs, rubs, or gallops. Chest: Clear to auscultation bilaterally, with no rhonchi, wheezes, or rales. Abdomen: Soft, nontender, nondistended, with no rigidity or guarding. Extremities: No cyanosis or edema. Lymphatics: see Neck Exam Skin: No concerning lesions. Musculoskeletal: symmetric strength and muscle tone throughout. Neurologic: Cranial nerves II through XII are grossly intact. No obvious focalities. Speech is fluent. Coordination is intact. Psychiatric: Judgment and insight are intact. Affect is appropriate.   ECOG = 1  0 - Asymptomatic (Fully active, able to carry on all predisease activities without restriction)  1 - Symptomatic but completely ambulatory (Restricted in physically strenuous activity but ambulatory and able to carry out work of a light or sedentary nature. For example, light housework, office work)  2 - Symptomatic, <50% in bed during the day (Ambulatory and capable of all self care but unable to carry out any work activities. Up and about more than 50% of waking hours)  3 - Symptomatic, >50% in bed, but not bedbound (Capable of only limited self-care, confined to bed or chair 50% or more of waking hours)  4 - Bedbound (Completely disabled. Cannot carry on any self-care. Totally confined to bed or chair)  5 - Death   Eustace Pen MM, Creech RH, Tormey DC, et al. [phone]). "Toxicity and response criteria of the [facility]". [place] Oncol. 5 (6): 649-55  LABORATORY DATA:  Lab Results  Component Value Date   WBC 8.8 07/08/2020   HGB 15.8 07/08/2020   HCT 49.3 07/08/2020   MCV 95.4 07/08/2020   PLT 245 07/08/2020   NEUTROABS 6.2 07/08/2020   Lab Results  Component Value Date   NA 143 07/08/2020   K 4.7 07/08/2020   CL 105 07/08/2020   CO2 31 07/08/2020   GLUCOSE 113 (H) 07/08/2020   CREATININE 1.24 07/08/2020   CALCIUM 9.2 07/08/2020      RADIOGRAPHY: CT Soft Tissue  Neck W Contrast  Result Date: 07/24/2020 CLINICAL DATA:  Head and neck cancer. Malignant neoplasm soft palate. History thyroid cancer 2018 EXAM: CT NECK WITH CONTRAST TECHNIQUE: Multidetector CT imaging of the neck was performed using the standard protocol following the bolus administration of intravenous contrast. CONTRAST:  18m OMNIPAQUE IOHEXOL 300 MG/ML  SOLN COMPARISON:  PET CT 07/16/2020 FINDINGS: Pharynx and larynx: Fullness of the soft palate on the right. Correlate with direct visualization. Mild uptake at  the base of tongue on the right on PET is noted without mass lesion in this area. Recommend mucosal evaluation. Bilateral tonsilliths. Salivary glands: No inflammation, mass, or stone. Thyroid: Bilateral thyroidectomy. No residual thyroid tissue or mass. Lymph nodes: No enlarged lymph nodes in the neck. Vascular: Atherosclerotic disease at the carotid bifurcation bilaterally. Moderate to severe stenosis proximal left internal carotid artery. Right carotid patent. Normal venous enhancement Limited intracranial: Negative Visualized orbits: Right cataract extraction.  No orbital lesion. Mastoids and visualized paranasal sinuses: Mucosal edema paranasal sinuses without air-fluid level. Bony thickening of the maxillary sinus bilaterally. Bilateral mastoid effusion. Skeleton: Cervical spondylosis. Prominent anterior osteophytes C3 through C7 with anterior displacement of the proximal esophagus. Upper chest: 17 mm sub solid density left upper lobe slightly increased in size from recent CT of 06/17/2020. Adjacent metal foreign body is present in the left upper lobe anterior to the sub solid density related to recent bronchoscopy and biopsy. Other: None IMPRESSION: 1. There is fullness of the tonsil bilaterally without mass lesion. Bilateral tonsilliths suggesting chronic inflammation. No adenopathy in neck 2. Fullness of the soft palate on the right. Correlate with direct visualization. 3. Postop thyroidectomy  for cancer.  No recurrent mass. 4. 17 mm sub solid nodule left upper lobe. This appears slightly larger which may be due to recent bronchoscopy and biopsy. Surgical clip adjacent to the nodule. 5. Carotid artery atherosclerotic disease with moderate to severe stenosis proximal left internal carotid artery. 6. Prominent anterior osteophytes C3 through C7 with mass-effect on the proximal esophagus. Electronically Signed   By: Franchot Gallo M.D.   On: 07/24/2020 11:54   NM PET Image Initial (PI) Skull Base To Thigh  Result Date: 07/16/2020 CLINICAL DATA:  Subsequent treatment strategy for non-small cell lung cancer. EXAM: NUCLEAR MEDICINE PET SKULL BASE TO THIGH TECHNIQUE: 13.5 mCi F-18 FDG was injected intravenously. Full-ring PET imaging was performed from the skull base to thigh after the radiotracer. CT data was obtained and used for attenuation correction and anatomic localization. Fasting blood glucose: 117 mg/dl COMPARISON:  07/13/2017 and CT of the chest of 06/17/2020 FINDINGS: Mediastinal blood pool activity: SUV max 3.22 Liver activity: SUV max NA NECK: Mild asymmetric hypermetabolic changes at the RIGHT base of tongue and soft palate (SUVmax = 4.8) Prevertebral asymmetric increased metabolic activity without visible, corresponding lesion and without clear signs of related linear muscular activity to explain this finding (image 26, series 4) (SUVmax = 4.3 no adenopathy in the neck.) Incidental CT findings: Post thyroidectomy. Mild RIGHT maxillary sinus disease. CHEST: Area of concern in the LEFT upper lobe with radiopaque marker in place with subsequent streak artifact, potentially accentuating the adjacent nodule and increase in ground-glass potentially postprocedural in the area of concern. The discrete nodule seen on the prior study is not as well visualized. There may be some associated volume loss in the area measures approximately 1.7 x 1.3 cm on today's exam which is larger but given additional  findings this is of uncertain significance. (SUVmax = 2.1) no hypermetabolic lymph nodes in the chest. Incidental CT findings: Basilar scarring in the chest particularly on the LEFT. Airways are patent. ABDOMEN/PELVIS: No abnormal hypermetabolic activity within the liver, pancreas, adrenal glands, or spleen. No hypermetabolic lymph nodes in the abdomen or pelvis. Adrenal glands are normal. Incidental CT findings: Liver and gallbladder are unremarkable. Mildly lobulated spleen.  Pancreatic contour is normal. No hydronephrosis or nephrolithiasis. No acute gastrointestinal process. The appendix is normal. Colonic diverticulosis. Atherosclerotic changes of the abdominal  aorta extending in the iliac vessels. SKELETON: No focal hypermetabolic activity to suggest skeletal metastasis. Incidental CT findings: Marked cervical spine degenerative changes with large anterior osteophytes. IMPRESSION: 1. Asymmetric activity in the oropharynx at base of tongue and in the prevertebral musculature anterior to the upper cervical spine without visible lesion and in the setting of misregistration artifact, may be physiologic and or artifactual. However, given the patient's history of thyroid cancer CT of the neck may be helpful for further assessment. 2. Mildly increased metabolic activity but less than mediastinal blood pool activity within the area of concern in the LEFT upper lobe. Ground-glass changes are increased in terms of size but this may relate to recent procedural changes and placement of fiducial markers. No mediastinal adenopathy or signs of distant metastatic disease. Electronically Signed   By: Zetta Bills M.D.   On: 07/16/2020 16:46      IMPRESSION: Stage IA (T1b, N0, M0) adenocarcinoma of the left upper lobe  Recent bronchoscopy shows pathology very suspicious for adenocarcinoma.  This would be consistent with slow-growing adenocarcinoma with this CT scans and PET scan findings.  I discussed with patient that  we are very suspicious the this is a primary lung cancer, early stage pathology is not completely definitive.  The patient however is comfortable with proceeding with treatment without  additional biopsies given the above findings.  Patient has seen thoracic surgery in the past and does not wish to consider surgery for this early stage lung cancer.  Given the location and size of the patient's lesion he would be an excellent candidate for stereotactic body radiation therapy.  Timothy Conway has kindly placed fiducial markers to help in planning and his treatment.  Today, I talked to the patient and friend about the findings and work-up thus far.  We discussed the natural history of adenocarcinoma and general treatment, highlighting the role of radiotherapy in the management.  We discussed the available radiation techniques (SBRT), and focused on the details of logistics and delivery.  We reviewed the anticipated acute and late sequelae associated with radiation in this setting.  The patient was encouraged to ask questions that I answered to the best of my ability.  A patient consent form was discussed and signed.  We retained a copy for our records.  The patient would like to proceed with radiation and will be scheduled for CT simulation.  PLAN: Patient will be scheduled for SBRT simulation next week with treatments to begin approximately 2 weeks from now.  Anticipate 3 SBRT treatments directed at the left upper lobe pulmonary nodule.  Total time spent in this encounter was 60 minutes which included reviewing the patient's most recent CT scans, history of thyroid cancer and thyroidectomy, consultations, follow-ups, bronchoscopy, biopsies, pathology reports, PET scans, physical examination, and documentation.  ------------------------------------------------  Blair Promise, PhD, Conway  This document serves as a record of services personally performed by Gery Pray, Conway. It was created on his behalf by  Clerance Lav, a trained medical scribe. The creation of this record is based on the scribe's personal observations and the provider's statements to them. This document has been checked and approved by the attending provider.

## 2020-08-01 ENCOUNTER — Encounter: Payer: Self-pay | Admitting: *Deleted

## 2020-08-01 ENCOUNTER — Encounter: Payer: Self-pay | Admitting: General Practice

## 2020-08-01 NOTE — Progress Notes (Signed)
[place] Psychosocial Distress Screening Clinical Social Work  Clinical Social Work was referred by distress screening protocol.  The patient scored a 8 on the Psychosocial Distress Thermometer which indicates moderate distress. Clinical Social Worker did not contact patient as he declined referral to social work to assess for distress and other psychosocial needs.   ONCBCN DISTRESS SCREENING 07/31/2020  Screening Type Initial Screening  Distress experienced in past week (1-10) 8  Referral to clinical social work No  Other declines referral has a health advocate at Dr. Antonieta Pert office    Clinical Social Worker follow up needed: No.  If yes, follow up plan:  Beverely Pace, [place], LCSW Clinical Social Worker Phone:  [phone]

## 2020-08-01 NOTE — Progress Notes (Signed)
Oncology Nurse Navigator Documentation  Oncology Nurse Navigator Flowsheets 08/01/2020  Abnormal Finding Date -  Confirmed Diagnosis Date -  Diagnosis Status -  Navigator Follow Up Date: 08/06/2020  Navigator Follow Up Reason: Radiation  Navigator Location [facility]  Referral Date to RadOnc/MedOnc -  Navigator Encounter Type Appt/Treatment Plan Review  Telephone -  Patient Visit Type MedOnc  Treatment Phase Pre-Tx/Tx Discussion  Barriers/Navigation Needs Coordination of Care;Education;Language/Communication  Education -  Interventions None Required  Acuity Level 3-Moderate Needs (3-4 Barriers Identified)  Referrals -  Coordination of Care -  Education Method -  Support Groups/Services Friends and Family  Time Spent with Patient 15

## 2020-08-06 ENCOUNTER — Ambulatory Visit
Admission: RE | Admit: 2020-08-06 | Discharge: 2020-08-06 | Disposition: A | Discharge status: Home / Self Care | Payer: Medicare HMO | Source: Ambulatory Visit | Attending: Radiation Oncology | Admitting: Radiation Oncology

## 2020-08-06 ENCOUNTER — Other Ambulatory Visit: Payer: Self-pay

## 2020-08-06 DIAGNOSIS — Z510 Encounter for antineoplastic radiation therapy: Secondary | ICD-10-CM | POA: Insufficient documentation

## 2020-08-06 DIAGNOSIS — C3412 Malignant neoplasm of upper lobe, left bronchus or lung: Secondary | ICD-10-CM | POA: Diagnosis not present

## 2020-08-06 DIAGNOSIS — Z87891 Personal history of nicotine dependence: Secondary | ICD-10-CM | POA: Diagnosis not present

## 2020-08-07 ENCOUNTER — Encounter: Payer: Self-pay | Admitting: *Deleted

## 2020-08-07 NOTE — Progress Notes (Signed)
Oncology Nurse Navigator Documentation  Oncology Nurse Navigator Flowsheets 08/07/2020  Abnormal Finding Date -  Confirmed Diagnosis Date -  Diagnosis Status -  Planned Course of Treatment Radiation  Phase of Treatment Radiation  Radiation Actual Start Date: 08/13/2020  Navigator Follow Up Date: 08/13/2020  Navigator Follow Up Reason: Radiation  Navigator Location [facility]  Referral Date to RadOnc/MedOnc -  Navigator Encounter Type Appt/Treatment Plan Review  Telephone -  Patient Visit Type MedOnc  Treatment Phase Pre-Tx/Tx Discussion  Barriers/Navigation Needs Coordination of Care;Education;Language/Communication  Education -  Interventions None Required  Acuity Level 3-Moderate Needs (3-4 Barriers Identified)  Referrals -  Coordination of Care -  Education Method -  Support Groups/Services Friends and Family  Time Spent with Patient 15

## 2020-08-08 DIAGNOSIS — Z87891 Personal history of nicotine dependence: Secondary | ICD-10-CM | POA: Diagnosis not present

## 2020-08-08 DIAGNOSIS — Z510 Encounter for antineoplastic radiation therapy: Secondary | ICD-10-CM | POA: Diagnosis not present

## 2020-08-08 DIAGNOSIS — C3412 Malignant neoplasm of upper lobe, left bronchus or lung: Secondary | ICD-10-CM | POA: Diagnosis not present

## 2020-08-09 NOTE — Telephone Encounter (Signed)
Dr. Lamonte Sakai please advise on patient mychart message   Our appointment 08/14/20 is right in the middle of my cancer treatments.  Would he like to wait until they're done (9/24)?

## 2020-08-12 NOTE — Telephone Encounter (Signed)
Yes, he should concentrate on the treatments and we can put our visit off. Please set him up with an OV when I'm available after 9/24. Thanks.

## 2020-08-13 ENCOUNTER — Other Ambulatory Visit: Payer: Self-pay

## 2020-08-13 ENCOUNTER — Ambulatory Visit
Admission: RE | Admit: 2020-08-13 | Discharge: 2020-08-13 | Disposition: A | Discharge status: Home / Self Care | Payer: Medicare HMO | Source: Ambulatory Visit | Attending: Radiation Oncology | Admitting: Radiation Oncology

## 2020-08-13 DIAGNOSIS — R911 Solitary pulmonary nodule: Secondary | ICD-10-CM

## 2020-08-13 DIAGNOSIS — C3412 Malignant neoplasm of upper lobe, left bronchus or lung: Secondary | ICD-10-CM | POA: Diagnosis not present

## 2020-08-13 DIAGNOSIS — Z87891 Personal history of nicotine dependence: Secondary | ICD-10-CM | POA: Diagnosis not present

## 2020-08-13 DIAGNOSIS — Z510 Encounter for antineoplastic radiation therapy: Secondary | ICD-10-CM | POA: Diagnosis not present

## 2020-08-14 ENCOUNTER — Encounter: Payer: Self-pay | Admitting: *Deleted

## 2020-08-14 ENCOUNTER — Ambulatory Visit: Payer: Medicare HMO | Admitting: Radiation Oncology

## 2020-08-14 ENCOUNTER — Ambulatory Visit: Payer: Medicare HMO | Admitting: Emergency Medicine

## 2020-08-14 NOTE — Progress Notes (Signed)
Patient had his first SBRT yesterday. He says it went well with no complaints. He does state he's not sleeping very well since this all started but he is managing. At this time he has no questions or concerns. Confirmed that he still has my contact number and he knows to call if he has any needs.  Oncology Nurse Navigator Documentation  Oncology Nurse Navigator Flowsheets 08/14/2020  Abnormal Finding Date -  Confirmed Diagnosis Date -  Diagnosis Status -  Planned Course of Treatment -  Phase of Treatment -  Radiation Actual Start Date: 08/13/2020  Navigator Follow Up Date: 09/06/2020  Navigator Follow Up Reason: Follow-up Appointment  Navigator Location [facility]  Referral Date to RadOnc/MedOnc -  Navigator Encounter Type Appt/Treatment Plan Review;Telephone  Telephone Patient Update;Outgoing Call  Treatment Initiated Date 08/13/2020  Patient Visit Type MedOnc  Treatment Phase Active Tx  Barriers/Navigation Needs Coordination of Care;Education;Language/Communication  Education -  Interventions Psycho-Social Support  Acuity Level 3-Moderate Needs (3-4 Barriers Identified)  Referrals -  Coordination of Care -  Education Method -  Support Groups/Services Friends and Family  Time Spent with Patient 30

## 2020-08-15 ENCOUNTER — Ambulatory Visit
Admission: RE | Admit: 2020-08-15 | Discharge: 2020-08-15 | Disposition: A | Discharge status: Home / Self Care | Payer: Medicare HMO | Source: Ambulatory Visit | Attending: Radiation Oncology | Admitting: Radiation Oncology

## 2020-08-15 ENCOUNTER — Other Ambulatory Visit: Payer: Self-pay

## 2020-08-15 DIAGNOSIS — C3412 Malignant neoplasm of upper lobe, left bronchus or lung: Secondary | ICD-10-CM | POA: Diagnosis not present

## 2020-08-15 DIAGNOSIS — Z510 Encounter for antineoplastic radiation therapy: Secondary | ICD-10-CM | POA: Diagnosis not present

## 2020-08-15 DIAGNOSIS — Z87891 Personal history of nicotine dependence: Secondary | ICD-10-CM | POA: Diagnosis not present

## 2020-08-15 DIAGNOSIS — R911 Solitary pulmonary nodule: Secondary | ICD-10-CM

## 2020-08-20 ENCOUNTER — Ambulatory Visit
Admission: RE | Admit: 2020-08-20 | Discharge: 2020-08-20 | Disposition: A | Discharge status: Home / Self Care | Payer: Medicare HMO | Source: Ambulatory Visit | Attending: Radiation Oncology | Admitting: Radiation Oncology

## 2020-08-20 ENCOUNTER — Encounter: Payer: Self-pay | Admitting: Radiation Oncology

## 2020-08-20 ENCOUNTER — Other Ambulatory Visit: Payer: Self-pay

## 2020-08-20 DIAGNOSIS — Z87891 Personal history of nicotine dependence: Secondary | ICD-10-CM | POA: Diagnosis not present

## 2020-08-20 DIAGNOSIS — Z510 Encounter for antineoplastic radiation therapy: Secondary | ICD-10-CM | POA: Diagnosis not present

## 2020-08-20 DIAGNOSIS — R911 Solitary pulmonary nodule: Secondary | ICD-10-CM

## 2020-08-20 DIAGNOSIS — C3412 Malignant neoplasm of upper lobe, left bronchus or lung: Secondary | ICD-10-CM | POA: Diagnosis not present

## 2020-09-04 DIAGNOSIS — H10023 Other mucopurulent conjunctivitis, bilateral: Secondary | ICD-10-CM | POA: Diagnosis not present

## 2020-09-04 DIAGNOSIS — H02051 Trichiasis without entropian right upper eyelid: Secondary | ICD-10-CM | POA: Diagnosis not present

## 2020-09-06 ENCOUNTER — Encounter: Payer: Self-pay | Admitting: *Deleted

## 2020-09-06 ENCOUNTER — Other Ambulatory Visit: Payer: Self-pay

## 2020-09-06 ENCOUNTER — Inpatient Hospital Stay: Payer: Medicare HMO | Attending: Hematology & Oncology

## 2020-09-06 ENCOUNTER — Inpatient Hospital Stay (HOSPITAL_BASED_OUTPATIENT_CLINIC_OR_DEPARTMENT_OTHER): Payer: Medicare HMO | Admitting: Hematology & Oncology

## 2020-09-06 VITALS — BP 119/61 | HR 59 | Temp 98.4°F | Resp 21 | Wt 270.0 lb

## 2020-09-06 DIAGNOSIS — C3412 Malignant neoplasm of upper lobe, left bronchus or lung: Secondary | ICD-10-CM | POA: Insufficient documentation

## 2020-09-06 DIAGNOSIS — R911 Solitary pulmonary nodule: Secondary | ICD-10-CM

## 2020-09-06 DIAGNOSIS — Z7982 Long term (current) use of aspirin: Secondary | ICD-10-CM | POA: Insufficient documentation

## 2020-09-06 DIAGNOSIS — Z923 Personal history of irradiation: Secondary | ICD-10-CM | POA: Diagnosis not present

## 2020-09-06 DIAGNOSIS — C3490 Malignant neoplasm of unspecified part of unspecified bronchus or lung: Secondary | ICD-10-CM

## 2020-09-06 DIAGNOSIS — Z79899 Other long term (current) drug therapy: Secondary | ICD-10-CM | POA: Diagnosis not present

## 2020-09-06 LAB — CMP (CANCER CENTER ONLY)
ALT: 10 U/L (ref 0–44)
AST: 21 U/L (ref 15–41)
Albumin: 4.6 g/dL (ref 3.5–5.0)
Alkaline Phosphatase: 27 U/L — ABNORMAL LOW (ref 38–126)
Anion gap: 7 (ref 5–15)
BUN: 23 mg/dL (ref 8–23)
CO2: 31 mmol/L (ref 22–32)
Calcium: 9.7 mg/dL (ref 8.9–10.3)
Chloride: 104 mmol/L (ref 98–111)
Creatinine: 1.29 mg/dL — ABNORMAL HIGH (ref 0.61–1.24)
GFR, Estimated: 58 mL/min — ABNORMAL LOW (ref >60)
Glucose, Bld: 103 mg/dL — ABNORMAL HIGH (ref 70–99)
Potassium: 4.5 mmol/L (ref 3.5–5.1)
Sodium: 142 mmol/L (ref 135–145)
Total Bilirubin: 0.6 mg/dL (ref 0.3–1.2)
Total Protein: 7.4 g/dL (ref 6.5–8.1)

## 2020-09-06 LAB — CBC WITH DIFFERENTIAL (CANCER CENTER ONLY)
Abs Immature Granulocytes: 0.04 10*3/uL (ref 0.00–0.07)
Basophils Absolute: 0.1 10*3/uL (ref 0.0–0.1)
Basophils Relative: 1 %
Eosinophils Absolute: 0.2 10*3/uL (ref 0.0–0.5)
Eosinophils Relative: 2 %
HCT: 52.2 % — ABNORMAL HIGH (ref 39.0–52.0)
Hemoglobin: 16.4 g/dL (ref 13.0–17.0)
Immature Granulocytes: 1 %
Lymphocytes Relative: 29 %
Lymphs Abs: 2.1 10*3/uL (ref 0.7–4.0)
MCH: 30.5 pg (ref 26.0–34.0)
MCHC: 31.4 g/dL (ref 30.0–36.0)
MCV: 97.2 fL (ref 80.0–100.0)
Monocytes Absolute: 0.6 10*3/uL (ref 0.1–1.0)
Monocytes Relative: 7 %
Neutro Abs: 4.5 10*3/uL (ref 1.7–7.7)
Neutrophils Relative %: 60 %
Platelet Count: 224 10*3/uL (ref 150–400)
RBC: 5.37 MIL/uL (ref 4.22–5.81)
RDW: 13.1 % (ref 11.5–15.5)
WBC Count: 7.4 10*3/uL (ref 4.0–10.5)
nRBC: 0 % (ref 0.0–0.2)

## 2020-09-06 NOTE — Progress Notes (Signed)
[name]  Timothy Conway [id number] 30-Dec-1955 64 y.o. 09/06/2020   Principle Diagnosis:   Stage Ia (T1N0M0) adenocarcinoma of the left upper lung  Current Therapy:    Status post radiosurgery-5400 rad that was completed in 08/20/2020     Interim History:  Timothy Conway is back for follow-up.  This is his second office visit.  We first saw him, we had him set up for radiosurgery.  I thought this was the best thing for him since he did not want to have resection by thoracic surgery.  He underwent radiosurgery in September.  He said he completed this a couple weeks ago.  From what I can tell, he received 5400 rad.  He had no problems with radiosurgery.  He had no issues with cough or shortness of breath.  There is no chest wall pain.  He had a little bit of fatigue.  There is no change in bowel or bladder habits.  His appetite is doing okay.  Has had no dysphagia or odynophagia.  Overall, his performance status is ECOG 1.  Medications:  Current Outpatient Medications:  .  tiotropium (SPIRIVA) 18 MCG inhalation capsule, Place 2 puffs into inhaler and inhale daily., Disp: , Rfl:  .  albuterol (VENTOLIN HFA) 108 (90 Base) MCG/ACT inhaler, Inhale 2 puffs into the lungs every 6 (six) hours as needed for wheezing or shortness of breath., Disp: 18 g, Rfl: 3 .  alprazolam (XANAX) 2 MG tablet, Take 2 mg by mouth 3 (three) times daily as needed (anxiety.). , Disp: , Rfl:  .  ARIPiprazole (ABILIFY) 20 MG tablet, Take 20 mg by mouth at bedtime. , Disp: , Rfl:  .  aspirin EC 81 MG tablet, Take 81 mg by mouth daily. Swallow whole., Disp: , Rfl:  .  atenolol (TENORMIN) 100 MG tablet, Take 100 mg by mouth 2 (two) times daily., Disp: , Rfl:  .  CALCIUM PO, Take 1 tablet by mouth daily., Disp: , Rfl:  .  Cholecalciferol (VITAMIN D) 2000 units tablet, Take 2,000 Units by mouth daily., Disp: , Rfl:  .  fenofibrate 160 MG tablet, Take 160 mg by mouth daily with supper. ,  Disp: , Rfl:  .  fluticasone (FLONASE) 50 MCG/ACT nasal spray, Place 2 sprays into both nostrils daily as needed for allergies or rhinitis., Disp: , Rfl:  .  hydrocortisone 2.5 % cream, Apply 1 application topically 2 (two) times daily as needed., Disp: , Rfl:  .  levothyroxine (SYNTHROID) 137 MCG tablet, Take 1 tablet (137 mcg total) by mouth daily., Disp: 90 tablet, Rfl: 3 .  Multiple Vitamin (MULTIVITAMIN WITH MINERALS) TABS tablet, Take 1 tablet by mouth daily., Disp: , Rfl:  .  omeprazole (PRILOSEC) 20 MG capsule, Take 20 mg by mouth daily before breakfast. , Disp: , Rfl:  .  rosuvastatin (CRESTOR) 10 MG tablet, Take 10 mg by mouth daily. , Disp: , Rfl:   Allergies: No Known Allergies  Past Medical History, Surgical history, Social history, and Family History were reviewed and updated.  Review of Systems: Review of Systems  Constitutional: Negative.   HENT:  Negative.   Eyes: Negative.   Respiratory: Negative.   Cardiovascular: Negative.   Gastrointestinal: Negative.  Negative for abdominal distention.  Endocrine: Negative.   Genitourinary: Negative.    Musculoskeletal: Positive for gait problem.  Skin: Negative.   Neurological: Positive for gait problem.  Hematological: Negative.   Psychiatric/Behavioral: Negative.     Physical Exam:  weight is 270 lb (122.5 kg). His oral temperature is 98.4 F (36.9 C). His blood pressure is 119/61 and his pulse is 59 (abnormal). His respiration is 21 (abnormal) and oxygen saturation is 97%.   Wt Readings from Last 3 Encounters:  09/06/20 270 lb (122.5 kg)  07/31/20 274 lb 4 oz (124.4 kg)  07/08/20 271 lb (122.9 kg)    Physical Exam Vitals reviewed.  HENT:     Head: Normocephalic and atraumatic.  Eyes:     Pupils: Pupils are equal, round, and reactive to light.  Cardiovascular:     Rate and Rhythm: Normal rate and regular rhythm.     Heart sounds: Normal heart sounds.  Pulmonary:     Effort: Pulmonary effort is normal.      Breath sounds: Normal breath sounds.  Abdominal:     General: Bowel sounds are normal.     Palpations: Abdomen is soft.  Musculoskeletal:        General: No tenderness or deformity. Normal range of motion.     Cervical back: Normal range of motion.  Lymphadenopathy:     Cervical: No cervical adenopathy.  Skin:    General: Skin is warm and dry.     Findings: No erythema or rash.  Neurological:     Mental Status: He is alert and oriented to person, place, and time.  Psychiatric:        Behavior: Behavior normal.        Thought Content: Thought content normal.        Judgment: Judgment normal.    Lab Results  Component Value Date   WBC 7.4 09/06/2020   HGB 16.4 09/06/2020   HCT 52.2 (H) 09/06/2020   MCV 97.2 09/06/2020   PLT 224 09/06/2020     Chemistry      Component Value Date/Time   NA 142 09/06/2020 1431   K 4.5 09/06/2020 1431   CL 104 09/06/2020 1431   CO2 31 09/06/2020 1431   BUN 23 09/06/2020 1431   CREATININE 1.29 (H) 09/06/2020 1431      Component Value Date/Time   CALCIUM 9.7 09/06/2020 1431   ALKPHOS 27 (L) 09/06/2020 1431   AST 21 09/06/2020 1431   ALT 10 09/06/2020 1431   BILITOT 0.6 09/06/2020 1431      Impression and Plan: Timothy Conway is a very nice 64 year old white male.  He had a nonsmall cell lung cancer of the left upper lung.  This was an adenocarcinoma.  It was a low-grade adenocarcinoma based on the low level of activity on the PET scan.  I do not think we have to do another PET scan on him.  I think we can just do the CT scan to see how things have worked.  I will set him up with a CT scan in December.  This will give [place] enough time since radiosurgery completed so that we can see how the results look.  I have to believe that he is going to have a nice response.  I will see him back the same day that he has the CT scan in December.  [name], MD 10/8/20213:21 PM

## 2020-09-06 NOTE — Progress Notes (Signed)
Scheduling has left for the day. Will follow up on Monday to ensure follow up appointment with CT is made.  Oncology Nurse Navigator Documentation  Oncology Nurse Navigator Flowsheets 09/06/2020  Abnormal Finding Date -  Confirmed Diagnosis Date -  Diagnosis Status -  Planned Course of Treatment -  Phase of Treatment Radiation  Radiation Actual Start Date: -  Radiation Actual End Date: 08/20/2020  Navigator Follow Up Date: 09/09/2020  Navigator Follow Up Reason: Appointment Review  Navigator Location [facility]  Referral Date to RadOnc/MedOnc -  Navigator Encounter Type Follow-up Appt  Telephone -  Treatment Initiated Date -  Patient Visit Type MedOnc  Treatment Phase Post-Tx Follow-up  Barriers/Navigation Needs No Barriers At This Time  Education -  Interventions Psycho-Social Support  Acuity Level 2-Minimal Needs (1-2 Barriers Identified)  Referrals -  Coordination of Care -  Education Method -  Support Groups/Services Friends and Family  Time Spent with Patient 30

## 2020-09-09 ENCOUNTER — Telehealth: Payer: Self-pay | Admitting: Hematology & Oncology

## 2020-09-09 ENCOUNTER — Ambulatory Visit: Payer: Medicare HMO | Admitting: Emergency Medicine

## 2020-09-09 ENCOUNTER — Encounter: Payer: Self-pay | Admitting: Emergency Medicine

## 2020-09-09 ENCOUNTER — Other Ambulatory Visit: Payer: Self-pay

## 2020-09-09 DIAGNOSIS — C3492 Malignant neoplasm of unspecified part of left bronchus or lung: Secondary | ICD-10-CM

## 2020-09-09 DIAGNOSIS — J449 Chronic obstructive pulmonary disease, unspecified: Secondary | ICD-10-CM

## 2020-09-09 MED ORDER — SPIRIVA RESPIMAT 2.5 MCG/ACT IN AERS
2.0000 | INHALATION_SPRAY | Freq: Every day | RESPIRATORY_TRACT | 11 refills | Status: DC
Start: 2020-09-09 — End: 2021-09-11

## 2020-09-09 MED ORDER — SPIRIVA RESPIMAT 2.5 MCG/ACT IN AERS
2.0000 | INHALATION_SPRAY | Freq: Every day | RESPIRATORY_TRACT | 0 refills | Status: DC
Start: 2020-09-09 — End: 2020-09-17

## 2020-09-09 NOTE — Progress Notes (Signed)
Subjective:    Patient ID: Timothy Conway, male    DOB: 25-Feb-1956, 64 y.o.   MRN: [id number]  HPI  ROV 05/08/20 --[name] is a 64 year old smoker with COPD and history of pulmonary nodular disease.  He is also had a thyroidectomy for papillary thyroid cancer, some precancerous lesions taken on prior colonoscopy.  I last saw him in March 2019.  He had a CT scan of his chest May 10, 2018 that showed an overall stable 1.3 cm groundglass left upper lobe nodule.  He returns today to reestablish care and follow-up that lesion.  It measures 0.9 x 0.6 cm on CT chest 05/2017. I sent him to [facility] for possible VATS resection in 2019, but pt was scared to get this done. He was going to possibly get ENB with Dr Roxan Hockey, but this was canceled as well.  He has been on Spiriva.  Currently he is on willexa for a study - plans to go back to Spiriva when this runs out. Needs an albuterol refill. He quit smoking!!  ROV 09/09/20 --follow-up visit for 64 year old gentleman with a history of former tobacco, COPD, thyroidectomy for papillary thyroid cancer.  We have been following groundglass nodular disease by CT chest.  We had considered a possible primary resection but he deferred.  He underwent navigational bronchoscopy 06/25/2020 that confirmed adenocarcinoma in the left upper lobe lesion.  He underwent SBRT, completed 08/20/2020, planning for repeat CT chest in December. Finishing his Wixella, ready to restart Spiriva respimat. Has albuterol uses 1-2x a week, usually for SOB - doesn't necessarily coincide w exertion, sometimes when he lays down for bed. No cough.  COVID up to date, flu shot up to date.   MDMM:  Reviewed Dr. Antonieta Pert notes 09/06/2020, Dr. Sondra Come 08/20/2020                                                                       Review of Systems  Constitutional: Negative for fever and unexpected weight change.  HENT: Negative for congestion, dental problem, ear pain, nosebleeds, postnasal drip,  rhinorrhea, sinus pressure, sneezing, sore throat and trouble swallowing.   Eyes: Negative for redness and itching.  Respiratory: Negative for cough, chest tightness, shortness of breath and wheezing.   Cardiovascular: Negative for palpitations and leg swelling.  Gastrointestinal: Negative for nausea and vomiting.  Genitourinary: Negative for dysuria.  Musculoskeletal: Negative for joint swelling.  Skin: Negative for rash.  Neurological: Negative for headaches.  Hematological: Does not bruise/bleed easily.  Psychiatric/Behavioral: Negative for dysphoric mood. The patient is not nervous/anxious.        Objective:   Physical Exam Vitals:   09/09/20 0924  BP: 116/72  Pulse: (!) 57  Temp: 98.6 F (37 C)  TempSrc: Oral  SpO2: 96%  Weight: 274 lb 12.8 oz (124.6 kg)  Height: 6' (1.829 m)   Gen: Pleasant, well-nourished, in no distress  ENT: No lesions,  mouth clear,  oropharynx clear, no postnasal drip  Neck: No JVD, no stridor. He does have some UA noise on a forced exp  Lungs: No use of accessory muscles, clear, soft wheeze on a forced expiration  Cardiovascular: RRR, heart sounds normal, no murmur or gallops, no peripheral edema  Musculoskeletal: No deformities,  no cyanosis or clubbing  Neuro: alert, non focal  Skin: Warm, no lesions or rashes       Assessment & Plan:  Adenocarcinoma, lung, left (HCC) Diagnosed by navigational bronchoscopy, now post SBRT.  Following with Drs. Ennever and Kinard.  Planning for surveillance CT chest in December and then follow-up with them.  COPD (chronic obstructive pulmonary disease) (HCC) Overall doing well.  Importantly he stopped smoking.  He has been finishing his Wixela prescription, wants to go back to Spiriva Respimat.  We will send an order for this to his pharmacy today.  COVID-19 and flu vaccines are both up-to-date.  Rare albuterol use.   Baltazar Apo, MD, PhD 09/09/2020, 9:56 AM [facility] [phone] or if no answer [phone]

## 2020-09-09 NOTE — Addendum Note (Signed)
Addended by: Gavin Potters R on: 09/09/2020 10:18 AM   Modules accepted: Orders

## 2020-09-09 NOTE — Assessment & Plan Note (Signed)
Overall doing well.  Importantly he stopped smoking.  He has been finishing his Wixela prescription, wants to go back to Spiriva Respimat.  We will send an order for this to his pharmacy today.  COVID-19 and flu vaccines are both up-to-date.  Rare albuterol use.

## 2020-09-09 NOTE — Patient Instructions (Signed)
We are Wixela prescription runs out please start Spiriva Respimat 2 puffs once daily.  Take this every day as your maintenance medication. Keep your albuterol available to use 2 puffs when needed for shortness of breath, chest tightness, wheezing. Get your CT scan of the chest and follow with Dr. Marin Olp in December as planned.  Congratulations on stopping smoking!  Keep up the good work, do not restart.  Call us if you are tempted to do so.  We may be able to help COVID-19 vaccine and flu shot are both up-to-date Follow with Dr Lamonte Sakai in 6 months or sooner if you have any problems

## 2020-09-09 NOTE — Assessment & Plan Note (Signed)
Diagnosed by navigational bronchoscopy, now post SBRT.  Following with Drs. Ennever and Kinard.  Planning for surveillance CT chest in December and then follow-up with them.

## 2020-09-09 NOTE — Telephone Encounter (Signed)
Appointments scheduled calendar printed & mailed per 10/8 los

## 2020-09-14 ENCOUNTER — Other Ambulatory Visit: Payer: Self-pay | Admitting: Emergency Medicine

## 2020-09-17 ENCOUNTER — Encounter: Payer: Self-pay | Admitting: Internal Medicine

## 2020-09-17 ENCOUNTER — Ambulatory Visit: Payer: Medicare HMO | Admitting: Internal Medicine

## 2020-09-17 ENCOUNTER — Encounter: Payer: Self-pay | Admitting: *Deleted

## 2020-09-17 ENCOUNTER — Other Ambulatory Visit: Payer: Self-pay

## 2020-09-17 VITALS — BP 154/90 | HR 57 | Ht 72.0 in | Wt 272.8 lb

## 2020-09-17 DIAGNOSIS — E890 Postprocedural hypothyroidism: Secondary | ICD-10-CM | POA: Diagnosis not present

## 2020-09-17 DIAGNOSIS — C73 Malignant neoplasm of thyroid gland: Secondary | ICD-10-CM | POA: Diagnosis not present

## 2020-09-17 LAB — TSH: TSH: 12.27 u[IU]/mL — ABNORMAL HIGH (ref 0.35–4.50)

## 2020-09-17 LAB — T4, FREE: Free T4: 0.77 ng/dL (ref 0.60–1.60)

## 2020-09-17 NOTE — Progress Notes (Signed)
Oncology Nurse Navigator Documentation  Oncology Nurse Navigator Flowsheets 09/17/2020  Abnormal Finding Date -  Confirmed Diagnosis Date -  Diagnosis Status -  Planned Course of Treatment -  Phase of Treatment -  Radiation Actual Start Date: -  Radiation Actual End Date: -  Navigator Follow Up Date: 11/14/2020  Navigator Follow Up Reason: Scan Review;Follow-up Appointment  Navigator Location [facility]  Referral Date to RadOnc/MedOnc -  Navigator Encounter Type Appt/Treatment Plan Review  Telephone -  Treatment Initiated Date -  Patient Visit Type MedOnc  Treatment Phase Post-Tx Follow-up  Barriers/Navigation Needs No Barriers At This Time  Education -  Interventions None Required  Acuity Level 2-Minimal Needs (1-2 Barriers Identified)  Referrals -  Coordination of Care -  Education Method -  Support Groups/Services Friends and Family  Time Spent with Patient 15

## 2020-09-17 NOTE — Patient Instructions (Addendum)
Patient Instructions  Please continue Synthroid 137 mcg daily  Take the thyroid hormone every day, with water, at least 30 minutes before breakfast, separated by at least 4 hours from: - acid reflux medications - calcium - iron - multivitamins  Please stop at the lab.  Please come back for a follow-up appointment in 1 year.

## 2020-09-17 NOTE — Progress Notes (Addendum)
Patient ID: Timothy Conway, male   DOB: February 08, 1956, 64 y.o.   MRN: [id number]   This visit occurred during the SARS-CoV-2 public health emergency.  Safety protocols were in place, including screening questions prior to the visit, additional usage of staff PPE, and extensive cleaning of exam room while observing appropriate contact time as indicated for disinfecting solutions.   HPI  Timothy Conway is a 64 y.o.-year-old male, initially referred by Dr. Harlow Asa, presenting for follow-up for papillary thyroid cancer and postsurgical hypothyroidism.  Last visit 1 year ago.  Since last visit she was diagnosed with a 11 mm left adenocarcinoma. He had radiotactic RxTx. He will have a new CT 10/2020.   Reviewed and addended his cancer history: Pt. has been found to have a hypermetabolic thyroid nodule on a PET scan (07/13/2017) obtained by Dr. Lamonte Sakai, his pulmonologist, to investigate a lung nodule.  A thyroid U/S (07/30/2017) showed a L lobe 1.3 x 1.0 x 1.2 cm nodule, solid, hypoechoic, lobulated/irregular, with peripheral calcifications. Bx of this nodule (07/30/2017): PTC  Total thyroidectomy (08/27/2017) by Dr. Harlow Asa: Diagnosis 1. Thyroid, thyroidectomy, total - PAPILLARY THYROID CARCINOMA, CLASSIC TYPE, SPANNING 1.4 CM. - NO EXTRATHYROIDAL EXTENSION. - RESECTION MARGINS ARE NEGATIVE. - SEE ONCOLOGY TABLE. 2. Lymph node, biopsy, central compartment - THREE OF THREE LYMPH NODES NEGATIVE FOR CARCINOMA (0/3). - ONE BENIGN PARATHYROID. Microscopic Comment 1. THYROID Specimen: Total thyroid with central compartment lymph nodes. Procedure (including lymph node sampling if applicable): Total thyroidectomy with central lymph node biopsy. Specimen Integrity (intact/fragmented): Intact. Tumor focality: Unifocal. Dominant tumor: Maximum tumor size (cm): 1.4 cm. Tumor laterality: Left. Histologic type (including subtype and/or unique features as applicable): Papillary thyroid carcinoma, classic  type. Tumor capsule: Present. Extrathyroidal extension: Absent. Capsular invasion with degree of invasion if present: N/A. Margins: Negative. Lymphatic or vascular invasion: Not identified. Lymph nodes: # examined 3; # positive; 0 Extracapsular extension (if applicable): N/A. TNM code: pT1b, pN0 Non-neoplastic thyroid: Largely unremarkable. Comments: There tumor is largely fibrotic and calcified with only a small portion of viable tumor.  Neck U/S (09/25/2019): The thyroid gland is surgically absent. Evaluation of the left and right resection bed demonstrates no evidence of residual thyroid tissue or nodularity. No suspicious lymphadenopathy is identified. IMPRESSION: Surgical changes of prior total thyroidectomy without evidence of residual or recurrent thyroid tissue, nodularity or lymphadenopathy.  PET/CT (09/15/2020): Increased uptake in the base of the tongue and anterior paravertebral muscles  CT neck (07/24/2020): Increased uptake is most likely related to tonsillar inflammation  Thyroglobulin is detectable but very low: Lab Results  Component Value Date   THYROGLB 0.1 (L) 09/14/2019   THYROGLB 0.2 (L) 09/08/2018   THYROGLB 0.1 (L) 03/23/2018   Antithyroglobulin antibodies are undetectable: Lab Results  Component Value Date   THGAB <1 09/14/2019   THGAB <1 09/08/2018   THGAB <1 03/23/2018   Postsurgical hypothyroidism  Pt is on levothyroxine 137 mcg daily, taken: - in am - fasting - at least 30 min from b'fast (usually skips breakfast) - + Ca  -2x a day, first dose in the pm - no Fe - + MVI, + PPIs - at night - not on Biotin  Reviewed his TFTs: Lab Results  Component Value Date   TSH 0.64 09/14/2019   TSH 4.66 (H) 09/08/2018   TSH 0.52 03/23/2018   TSH 12.75 (H) 02/11/2018   TSH 2.01 11/12/2017   TSH 3.67 10/01/2017   FREET4 1.04 09/14/2019   FREET4 0.73 09/08/2018   FREET4 0.87  03/23/2018   FREET4 0.62 02/11/2018   FREET4 0.70 11/12/2017    FREET4 0.77 10/01/2017    Pt denies: - feeling nodules in neck - hoarseness - dysphagia - choking - SOB with lying down  Unknown family history of thyroid disease or thyroid cancer since he was adopted. o h/o radiation tx to head or neck.  No herbal supplements. No Biotin use. No recent steroids use.   ROS: Constitutional: no weight gain/no weight loss, + fatigue, no subjective hyperthermia, no subjective hypothermia Eyes: no blurry vision, no xerophthalmia ENT: no sore throat, + see HPI Cardiovascular: no CP/no SOB/no palpitations/no leg swelling Respiratory: + cough (COPD)/no SOB/no wheezing Gastrointestinal: no N/no V/no D/no C/no acid reflux Musculoskeletal: no muscle aches/no joint aches Skin: no rashes, no hair loss Neurological: no tremors/no numbness/no tingling/no dizziness  I reviewed pt's medications, allergies, PMH, social hx, family hx, and changes were documented in the history of present illness. Otherwise, unchanged from my initial visit note.  Past Medical History:  Diagnosis Date  . Anxiety   . COPD (chronic obstructive pulmonary disease) ([place])   . Depression   . Dry eye syndrome   . Dysrhythmia    hx of PAC's  . GERD (gastroesophageal reflux disease)   . Gout   . Hyperlipidemia   . Hypertension   . Hypothyroidism   . Thyroid cancer ([place])    2018   Past Surgical History:  Procedure Laterality Date  . ABDOMINAL SURGERY     hemmoraging ulcer  . BRONCHIAL BIOPSY  06/25/2020   Procedure: BRONCHIAL BIOPSIES;  Surgeon: Collene Gobble, MD;  Location: [facility] ENDOSCOPY;  Service: Pulmonary;;  . BRONCHIAL BRUSHINGS  06/25/2020   Procedure: BRONCHIAL BRUSHINGS;  Surgeon: Collene Gobble, MD;  Location: [facility] ENDOSCOPY;  Service: Pulmonary;;  . BRONCHIAL NEEDLE ASPIRATION BIOPSY  06/25/2020   Procedure: BRONCHIAL NEEDLE ASPIRATION BIOPSIES;  Surgeon: Collene Gobble, MD;  Location: [facility] ENDOSCOPY;  Service: Pulmonary;;  . BRONCHIAL WASHINGS  06/25/2020   Procedure:  BRONCHIAL WASHINGS;  Surgeon: Collene Gobble, MD;  Location: [facility] ENDOSCOPY;  Service: Pulmonary;;  . CATARACT EXTRACTION Right   . COLONOSCOPY    . EYE SURGERY Right    x 4  between ages [age]  . FIDUCIAL MARKER PLACEMENT  06/25/2020   Procedure: FIDUCIAL MARKER PLACEMENT;  Surgeon: Collene Gobble, MD;  Location: [facility] ENDOSCOPY;  Service: Pulmonary;;  . THYROIDECTOMY N/A 08/27/2017   Procedure: TOTAL THYROIDECTOMY WITH LIMITED LYMPH NODE DISSECTION;  Surgeon: Armandina Gemma, MD;  Location: [facility] ORS;  Service: General;  Laterality: N/A;  . UPPER GI ENDOSCOPY    . VIDEO BRONCHOSCOPY WITH ENDOBRONCHIAL NAVIGATION N/A 06/25/2020   Procedure: VIDEO BRONCHOSCOPY WITH ENDOBRONCHIAL NAVIGATION;  Surgeon: Collene Gobble, MD;  Location: [facility] ENDOSCOPY;  Service: Pulmonary;  Laterality: N/A;   Social History   Social History  . Marital status: Single    Spouse name: N/A  . Number of children: 0   Occupational History  . n/a   Social History Main Topics  . Smoking status: Current Every Day Smoker    Packs/day: 1.00    Years: 40.00    Types: Cigarettes  . Smokeless tobacco: Never Used     Comment: 1 1/2 per day  . Alcohol use 0.6 oz/week    3-4 Cans of beer per day  . Drug use: No   Current Outpatient Medications on File Prior to Visit  Medication Sig Dispense Refill  . albuterol (VENTOLIN HFA) 108 (90 Base) MCG/ACT  inhaler INHALE 2 PUFFS INTO THE LUNGS EVERY 6 HOURS AS NEEDED FOR WHEEZING OR SHORTNESS OF BREATH 18 g 3  . alprazolam (XANAX) 2 MG tablet Take 2 mg by mouth 3 (three) times daily as needed (anxiety.).     Marland Kitchen ARIPiprazole (ABILIFY) 20 MG tablet Take 20 mg by mouth at bedtime.     Marland Kitchen aspirin EC 81 MG tablet Take 81 mg by mouth daily. Swallow whole.    Marland Kitchen atenolol (TENORMIN) 100 MG tablet Take 100 mg by mouth 2 (two) times daily.    Marland Kitchen CALCIUM PO Take 1 tablet by mouth daily.    . Cholecalciferol (VITAMIN D) 2000 units tablet Take 2,000 Units by mouth daily.    . fenofibrate 160 MG tablet  Take 160 mg by mouth daily with supper.     . fluticasone (FLONASE) 50 MCG/ACT nasal spray Place 2 sprays into both nostrils daily as needed for allergies or rhinitis.    . hydrocortisone 2.5 % cream Apply 1 application topically 2 (two) times daily as needed.    Marland Kitchen levothyroxine (SYNTHROID) 137 MCG tablet Take 1 tablet (137 mcg total) by mouth daily. 90 tablet 3  . Multiple Vitamin (MULTIVITAMIN WITH MINERALS) TABS tablet Take 1 tablet by mouth daily.    Marland Kitchen omeprazole (PRILOSEC) 20 MG capsule Take 20 mg by mouth daily before breakfast.     . rosuvastatin (CRESTOR) 10 MG tablet Take 10 mg by mouth daily.     Marland Kitchen tiotropium (SPIRIVA) 18 MCG inhalation capsule Place 2 puffs into inhaler and inhale daily.    . Tiotropium Bromide Monohydrate (SPIRIVA RESPIMAT) 2.5 MCG/ACT AERS Inhale 2 puffs into the lungs daily. 4 g 11  . Tiotropium Bromide Monohydrate (SPIRIVA RESPIMAT) 2.5 MCG/ACT AERS Inhale 2 puffs into the lungs daily. 4 g 0   No current facility-administered medications on file prior to visit.   No Known Allergies Family History  Adopted: Yes    PE: BP (!) 154/90   Pulse (!) 57   Ht 6' (1.829 m)   Wt 272 lb 12.8 oz (123.7 kg)   SpO2 93%   BMI 37.00 kg/m  Wt Readings from Last 3 Encounters:  09/17/20 272 lb 12.8 oz (123.7 kg)  09/09/20 274 lb 12.8 oz (124.6 kg)  09/06/20 270 lb (122.5 kg)   Constitutional: overweight, in NAD Eyes: PERRLA, EOMI, no exophthalmos ENT: moist mucous membranes, no neck masses palpated, no cervical lymphadenopathy Cardiovascular: RRR, No MRG Respiratory: CTA B Gastrointestinal: abdomen soft, NT, ND, BS+ Musculoskeletal: no deformities, strength intact in all 4 Skin: moist, warm, no rashes Neurological: no tremor with outstretched hands, DTR normal in all 4  ASSESSMENT: 1. Thyroid cancer - see HPI  2. Postsurgical Hypothyroidism  PLAN:  1. Papillary Thyroid Cancer -Patient with history of classic type of papillary thyroid cancer, not very  large, encapsulated, without extrathyroidal tension, without lymphovascular invasion and with negative lymph node biopsies.  Therefore, his cancer was considered low risk and we decided to continue without RAI treatment per the 2015 ATA thyroid cancer guidelines.  We are following him with thyroglobulin tumor marker and also neck ultrasound.  We discussed that even if he ends up needing RAI treatment as a salvage therapy in the future, his prognosis would not be changed -We reviewed the latest neck ultrasound that did not show any metastasis or recurrences in neck. Also, a new PET scan and CT scan obtained recently to stage his lung CA showed no Thy CA recurrences/metastases -His thyroglobulin  levels remain detectable, as expected since he did not have RAI treatment, but very low.  ATA antibodies are undetectable -We will continue to follow thyroglobulin trends.  We will recheck this today. -I will see him back in 1 year  2. Patient with history of total thyroidectomy for thyroid cancer, now with iatrogenic hypothyroidism, on Synthroid.  He had fatigue in the past, which improved with increasing his dose of Synthroid. - latest thyroid labs reviewed with pt >> normal: Lab Results  Component Value Date   TSH 0.64 09/14/2019   - he continues on LT4 137 mcg daily - pt feels good on this dose. - we discussed about taking the thyroid hormone every day, with water, >30 minutes before breakfast, separated by >4 hours from acid reflux medications, calcium, iron, multivitamins. Pt. is taking it correctly. - will check thyroid tests today: TSH and fT4 - If labs are abnormal, he will need to return for repeat TFTs in 1.5 months  Component     Latest Ref Rng & Units 09/17/2020  TSH     0.35 - 4.50 uIU/mL 12.27 (H)  T4,Free(Direct)     0.60 - 1.60 ng/dL 0.77  TSH is quite high, will increase the dose to 150 mcg levothyroxine and recheck his tests in 1.5 months.  Component     Latest Ref Rng & Units  09/17/2020  Thyroglobulin     ng/mL 0.7 (L)  Thyroglobulin Ab     < or = 1 IU/mL <1  Thyroglobulin higher.  I would not necessarily expect this to be undetectable without RAI treatment, however, I would want to follow the trend.  Plan to repeat his thyroglobulin when he returns for his thyroid function tests.  Philemon Kingdom, MD PhD [facility] Endocrinology

## 2020-09-18 LAB — THYROGLOBULIN LEVEL: Thyroglobulin: 0.7 ng/mL — ABNORMAL LOW

## 2020-09-18 LAB — THYROGLOBULIN ANTIBODY: Thyroglobulin Ab: <1 IU/mL (ref <=1)

## 2020-09-18 MED ORDER — LEVOTHYROXINE SODIUM 150 MCG PO TABS: 150.0000 ug | ORAL_TABLET | Freq: Every day | ORAL | 3 refills | Status: DC

## 2020-09-19 ENCOUNTER — Encounter: Payer: Self-pay | Admitting: Radiation Oncology

## 2020-09-19 ENCOUNTER — Ambulatory Visit
Admission: RE | Admit: 2020-09-19 | Discharge: 2020-09-19 | Disposition: A | Discharge status: Home / Self Care | Payer: Medicare HMO | Source: Ambulatory Visit | Attending: Radiation Oncology | Admitting: Radiation Oncology

## 2020-09-19 ENCOUNTER — Other Ambulatory Visit: Payer: Self-pay

## 2020-09-19 DIAGNOSIS — Z923 Personal history of irradiation: Secondary | ICD-10-CM | POA: Insufficient documentation

## 2020-09-19 DIAGNOSIS — C3492 Malignant neoplasm of unspecified part of left bronchus or lung: Secondary | ICD-10-CM

## 2020-09-19 DIAGNOSIS — Z7982 Long term (current) use of aspirin: Secondary | ICD-10-CM | POA: Insufficient documentation

## 2020-09-19 DIAGNOSIS — C3412 Malignant neoplasm of upper lobe, left bronchus or lung: Secondary | ICD-10-CM | POA: Diagnosis not present

## 2020-09-19 DIAGNOSIS — Z79899 Other long term (current) drug therapy: Secondary | ICD-10-CM | POA: Diagnosis not present

## 2020-09-19 DIAGNOSIS — R5383 Other fatigue: Secondary | ICD-10-CM | POA: Diagnosis not present

## 2020-09-19 NOTE — Progress Notes (Signed)
Radiation Oncology         [phone] ________________________________  Name: Timothy Conway MRN: [id number]  Date: 09/19/2020  DOB: [birth year]  Follow-Up Visit Note  CC: Lujean Amel, MD  [name], MD    ICD-10-CM   1. Adenocarcinoma, lung, left (HCC)  C34.92     Diagnosis: Stage IA (T1b, N0, M0) adenocarcinoma of the left upper lobe  Interval Since Last Radiation: One month  Radiation Treatment Dates: 08/13/2020 through 08/20/2020 Site Technique Total Dose (Gy) Dose per Fx (Gy) Completed Fx Beam Energies  Lung, Left: Lung_Lt IMRT 54/54 18 3/3 6XFFF    Narrative:  The patient returns today for routine follow-up. Since the end of treatment, he was seen by Dr. Marin Olp on 09/06/2020 and Dr. Lamonte Sakai on 09/09/2020. Restaging chest CT scan is scheduled for 11/14/2020.  On review of systems, he reports generalized fatigue which he relates to his thyroid gland. He denies pain within the chest area cough or hemoptysis, his breathing is stable.         ALLERGIES:  has No Known Allergies.  Meds: Current Outpatient Medications  Medication Sig Dispense Refill  . albuterol (VENTOLIN HFA) 108 (90 Base) MCG/ACT inhaler INHALE 2 PUFFS INTO THE LUNGS EVERY 6 HOURS AS NEEDED FOR WHEEZING OR SHORTNESS OF BREATH 18 g 3  . alprazolam (XANAX) 2 MG tablet Take 2 mg by mouth 3 (three) times daily as needed (anxiety.).     Marland Kitchen ARIPiprazole (ABILIFY) 20 MG tablet Take 20 mg by mouth at bedtime.     Marland Kitchen aspirin EC 81 MG tablet Take 81 mg by mouth daily. Swallow whole.    Marland Kitchen atenolol (TENORMIN) 100 MG tablet Take 100 mg by mouth 2 (two) times daily.    Marland Kitchen CALCIUM PO Take 1 tablet by mouth daily.    . Cholecalciferol (VITAMIN D) 2000 units tablet Take 2,000 Units by mouth daily.    . fenofibrate 160 MG tablet Take 160 mg by mouth daily with supper.     . fluticasone (FLONASE) 50 MCG/ACT nasal spray Place 2 sprays into both nostrils daily as needed for allergies or rhinitis.    . hydrocortisone  2.5 % cream Apply 1 application topically 2 (two) times daily as needed.    Marland Kitchen levothyroxine (SYNTHROID) 150 MCG tablet Take 1 tablet (150 mcg total) by mouth daily. 45 tablet 3  . Multiple Vitamin (MULTIVITAMIN WITH MINERALS) TABS tablet Take 1 tablet by mouth daily.    Marland Kitchen omeprazole (PRILOSEC) 20 MG capsule Take 20 mg by mouth daily before breakfast.     . rosuvastatin (CRESTOR) 10 MG tablet Take 10 mg by mouth daily.     . Tiotropium Bromide Monohydrate (SPIRIVA RESPIMAT) 2.5 MCG/ACT AERS Inhale 2 puffs into the lungs daily. 4 g 11   No current facility-administered medications for this encounter.    Physical Findings: The patient is in no acute distress. Patient is alert and oriented.  height is 6' (1.829 m) and weight is 272 lb 3.2 oz (123.5 kg). His temperature is 98.5 F (36.9 C). His blood pressure is 105/71 and his pulse is 57 (abnormal). His respiration is 20 and oxygen saturation is 97%.  No significant changes. Lungs are clear to auscultation bilaterally. Heart has regular rate and rhythm. No palpable cervical, supraclavicular, or axillary adenopathy. Abdomen soft, non-tender, normal bowel sounds.   Lab Findings: Lab Results  Component Value Date   WBC 7.4 09/06/2020   HGB 16.4 09/06/2020   HCT 52.2 (H)  09/06/2020   MCV 97.2 09/06/2020   PLT 224 09/06/2020    Radiographic Findings: No results found.  Impression: Stage IA (T1b, N0, M0) adenocarcinoma of the left upper lobe  The patient tolerated his SBRT well without any lasting side effects.  Plan: The patient is scheduled to undergo a restaging chest CT scan on 11/14/2020 and will follow up with Dr. Marin Olp later that day. He will follow up with radiation oncology in as needed basis in light of his close follow-up with Dr. Marin Olp. .   ____________________________________   Blair Promise, PhD, MD  This document serves as a record of services personally performed by Gery Pray, MD. It was created on his behalf  by Clerance Lav, a trained medical scribe. The creation of this record is based on the scribe's personal observations and the provider's statements to them. This document has been checked and approved by the attending provider.

## 2020-09-19 NOTE — Progress Notes (Incomplete)
  Patient Name: Timothy Conway MRN: [id number] DOB: 1956-03-23 Referring Physician: Burney Gauze (Profile Not Attached) Date of Service: 08/20/2020 [name], [name]                                                        End Of Treatment Note  Diagnoses: C34.12-Malignant neoplasm of upper lobe, left bronchus or lung  Cancer Staging: Stage IA (T1b, N0, M0) adenocarcinoma of the left upper lobe  Intent: Curative  Radiation Treatment Dates: 08/13/2020 through 08/20/2020 Site Technique Total Dose (Gy) Dose per Fx (Gy) Completed Fx Beam Energies  Lung, Left: Lung_Lt IMRT 54/54 18 3/3 6XFFF   Narrative: The patient tolerated radiation therapy relatively well. He did report fluctuating appetite and baseline shortness of breath secondary to COPD. He denied chest pain and cough.  Plan: The patient will follow-up with radiation oncology in one month.  ________________________________________________   Blair Promise, PhD, MD  This document serves as a record of services personally performed by Gery Pray, MD. It was created on his behalf by Clerance Lav, a trained medical scribe. The creation of this record is based on the scribe's personal observations and the provider's statements to them. This document has been checked and approved by the attending provider.

## 2020-09-19 NOTE — Progress Notes (Signed)
Patient here for a 1 month f/u visit with Dr. Sondra Come. Patient denies problems with coughing. Has some shortness of breath with exertion. States appetite is moderat.  BP 105/71 (BP Location: Right Arm, Patient Position: Sitting, Cuff Size: Large)   Pulse (!) 57   Temp 98.5 F (36.9 C)   Resp 20   Ht 6' (1.829 m)   Wt 272 lb 3.2 oz (123.5 kg)   SpO2 97%   BMI 36.92 kg/m   Wt Readings from Last 3 Encounters:  09/19/20 272 lb 3.2 oz (123.5 kg)  09/17/20 272 lb 12.8 oz (123.7 kg)  09/09/20 274 lb 12.8 oz (124.6 kg)

## 2020-09-20 NOTE — Addendum Note (Signed)
Addended by: Philemon Kingdom on: 09/20/2020 10:26 AM   Modules accepted: Orders

## 2020-10-18 DIAGNOSIS — F321 Major depressive disorder, single episode, moderate: Secondary | ICD-10-CM | POA: Diagnosis not present

## 2020-10-18 DIAGNOSIS — I719 Aortic aneurysm of unspecified site, without rupture: Secondary | ICD-10-CM | POA: Diagnosis not present

## 2020-10-18 DIAGNOSIS — E039 Hypothyroidism, unspecified: Secondary | ICD-10-CM | POA: Diagnosis not present

## 2020-10-18 DIAGNOSIS — Z85850 Personal history of malignant neoplasm of thyroid: Secondary | ICD-10-CM | POA: Diagnosis not present

## 2020-10-18 DIAGNOSIS — I10 Essential (primary) hypertension: Secondary | ICD-10-CM | POA: Diagnosis not present

## 2020-10-18 DIAGNOSIS — C3492 Malignant neoplasm of unspecified part of left bronchus or lung: Secondary | ICD-10-CM | POA: Diagnosis not present

## 2020-10-29 ENCOUNTER — Other Ambulatory Visit (INDEPENDENT_AMBULATORY_CARE_PROVIDER_SITE_OTHER): Payer: Medicare HMO

## 2020-10-29 ENCOUNTER — Other Ambulatory Visit: Payer: Self-pay

## 2020-10-29 DIAGNOSIS — E890 Postprocedural hypothyroidism: Secondary | ICD-10-CM | POA: Diagnosis not present

## 2020-10-29 DIAGNOSIS — C73 Malignant neoplasm of thyroid gland: Secondary | ICD-10-CM

## 2020-10-29 LAB — TSH: TSH: 0.28 u[IU]/mL — ABNORMAL LOW (ref 0.35–4.50)

## 2020-10-29 LAB — T4, FREE: Free T4: 1.13 ng/dL (ref 0.60–1.60)

## 2020-10-30 LAB — THYROGLOBULIN LEVEL: Thyroglobulin: 0.1 ng/mL — ABNORMAL LOW

## 2020-10-30 LAB — THYROGLOBULIN ANTIBODY: Thyroglobulin Ab: <1 IU/mL (ref <=1)

## 2020-10-31 ENCOUNTER — Other Ambulatory Visit: Payer: Self-pay | Admitting: Internal Medicine

## 2020-10-31 DIAGNOSIS — E890 Postprocedural hypothyroidism: Secondary | ICD-10-CM

## 2020-11-14 ENCOUNTER — Encounter: Payer: Self-pay | Admitting: Hematology & Oncology

## 2020-11-14 ENCOUNTER — Other Ambulatory Visit: Payer: Self-pay

## 2020-11-14 ENCOUNTER — Inpatient Hospital Stay (HOSPITAL_BASED_OUTPATIENT_CLINIC_OR_DEPARTMENT_OTHER): Payer: Medicare HMO | Admitting: Hematology & Oncology

## 2020-11-14 ENCOUNTER — Inpatient Hospital Stay: Payer: Medicare HMO | Attending: Hematology & Oncology

## 2020-11-14 ENCOUNTER — Ambulatory Visit (HOSPITAL_BASED_OUTPATIENT_CLINIC_OR_DEPARTMENT_OTHER)
Admission: RE | Admit: 2020-11-14 | Discharge: 2020-11-14 | Disposition: A | Discharge status: Home / Self Care | Payer: Medicare HMO | Source: Ambulatory Visit | Attending: Hematology & Oncology | Admitting: Hematology & Oncology

## 2020-11-14 ENCOUNTER — Encounter: Payer: Self-pay | Admitting: *Deleted

## 2020-11-14 ENCOUNTER — Telehealth: Payer: Self-pay

## 2020-11-14 VITALS — BP 124/55 | HR 73 | Temp 99.4°F | Resp 20 | Wt 271.0 lb

## 2020-11-14 DIAGNOSIS — Z7982 Long term (current) use of aspirin: Secondary | ICD-10-CM | POA: Insufficient documentation

## 2020-11-14 DIAGNOSIS — C3492 Malignant neoplasm of unspecified part of left bronchus or lung: Secondary | ICD-10-CM

## 2020-11-14 DIAGNOSIS — C3490 Malignant neoplasm of unspecified part of unspecified bronchus or lung: Secondary | ICD-10-CM | POA: Diagnosis not present

## 2020-11-14 DIAGNOSIS — R911 Solitary pulmonary nodule: Secondary | ICD-10-CM

## 2020-11-14 DIAGNOSIS — Z79899 Other long term (current) drug therapy: Secondary | ICD-10-CM | POA: Insufficient documentation

## 2020-11-14 DIAGNOSIS — C3412 Malignant neoplasm of upper lobe, left bronchus or lung: Secondary | ICD-10-CM | POA: Insufficient documentation

## 2020-11-14 LAB — CBC WITH DIFFERENTIAL (CANCER CENTER ONLY)
Abs Immature Granulocytes: 0.04 10*3/uL (ref 0.00–0.07)
Basophils Absolute: 0.1 10*3/uL (ref 0.0–0.1)
Basophils Relative: 1 %
Eosinophils Absolute: 0.1 10*3/uL (ref 0.0–0.5)
Eosinophils Relative: 1 %
HCT: 50.1 % (ref 39.0–52.0)
Hemoglobin: 15.9 g/dL (ref 13.0–17.0)
Immature Granulocytes: 0 %
Lymphocytes Relative: 23 %
Lymphs Abs: 2.2 10*3/uL (ref 0.7–4.0)
MCH: 30.1 pg (ref 26.0–34.0)
MCHC: 31.7 g/dL (ref 30.0–36.0)
MCV: 94.9 fL (ref 80.0–100.0)
Monocytes Absolute: 0.6 10*3/uL (ref 0.1–1.0)
Monocytes Relative: 6 %
Neutro Abs: 6.4 10*3/uL (ref 1.7–7.7)
Neutrophils Relative %: 69 %
Platelet Count: 249 10*3/uL (ref 150–400)
RBC: 5.28 MIL/uL (ref 4.22–5.81)
RDW: 12.3 % (ref 11.5–15.5)
WBC Count: 9.4 10*3/uL (ref 4.0–10.5)
nRBC: 0 % (ref 0.0–0.2)

## 2020-11-14 LAB — CMP (CANCER CENTER ONLY)
ALT: 14 U/L (ref 0–44)
AST: 16 U/L (ref 15–41)
Albumin: 4.4 g/dL (ref 3.5–5.0)
Alkaline Phosphatase: 30 U/L — ABNORMAL LOW (ref 38–126)
Anion gap: 8 (ref 5–15)
BUN: 18 mg/dL (ref 8–23)
CO2: 29 mmol/L (ref 22–32)
Calcium: 9 mg/dL (ref 8.9–10.3)
Chloride: 102 mmol/L (ref 98–111)
Creatinine: 1.23 mg/dL (ref 0.61–1.24)
GFR, Estimated: >60 mL/min (ref >60)
Glucose, Bld: 107 mg/dL — ABNORMAL HIGH (ref 70–99)
Potassium: 4 mmol/L (ref 3.5–5.1)
Sodium: 139 mmol/L (ref 135–145)
Total Bilirubin: 0.5 mg/dL (ref 0.3–1.2)
Total Protein: 6.9 g/dL (ref 6.5–8.1)

## 2020-11-14 IMAGING — CT CT CHEST W/O CM
2 of 3 series · 15 of 36 positions shown, 18 images · non-contrast
Comparison: PET-CT [DATE].  Chest CT [DATE]

CLINICAL DATA: Non-small-cell lung cancer.  Restaging.

EXAM:
CT CHEST WITHOUT CONTRAST
TECHNIQUE: Multidetector CT imaging of the chest was performed following the
standard protocol without IV contrast.

[Series 2: thorax · axial · 0.87mm/px · z∈[-322,-40]mm · 12 of 167 slices shown, 15 images]
[im 13/167  mediastinal]
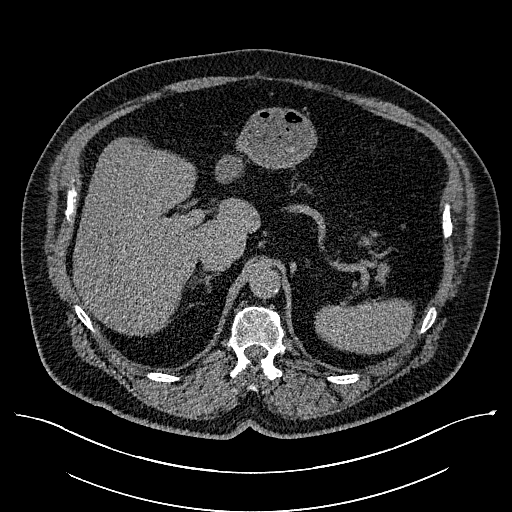
[im 13/167  lung]
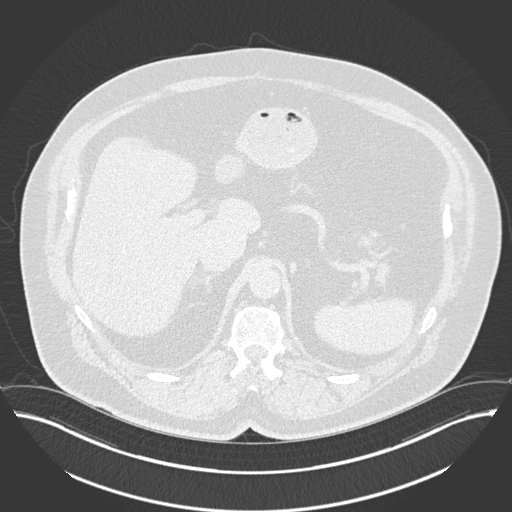
[im 25/167  lung]
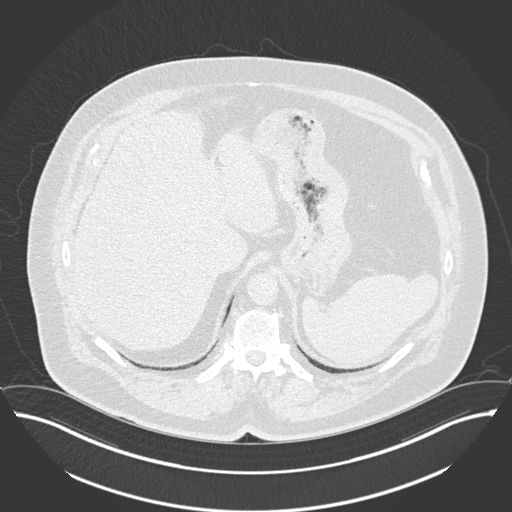
[im 37/167  lung]
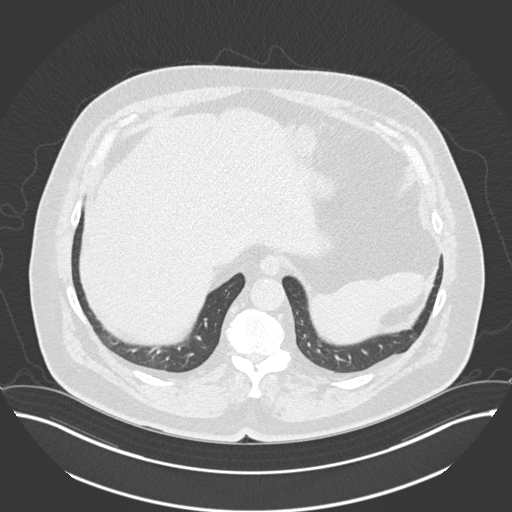
[im 50/167  lung]
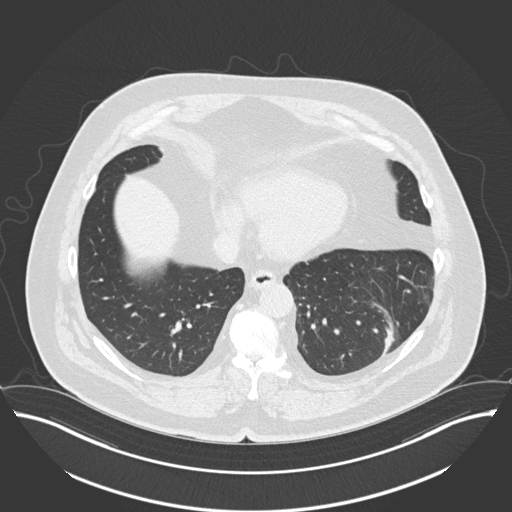
[im 62/167  mediastinal]
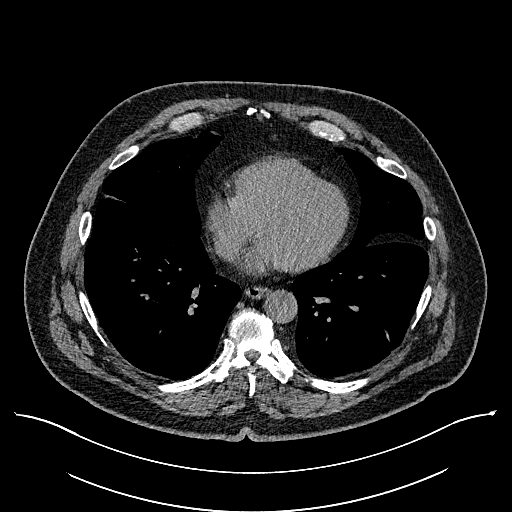
[im 62/167  lung]
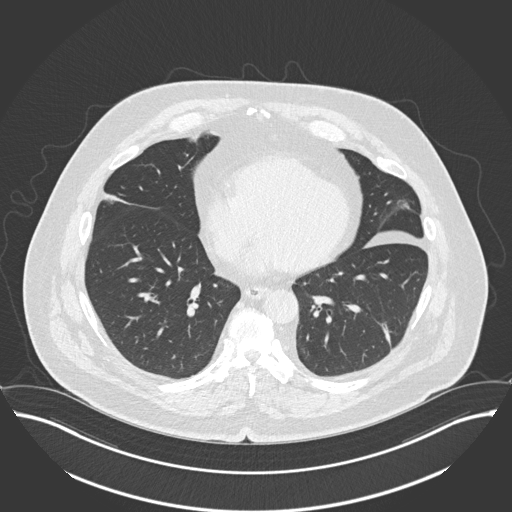
[im 74/167  lung]
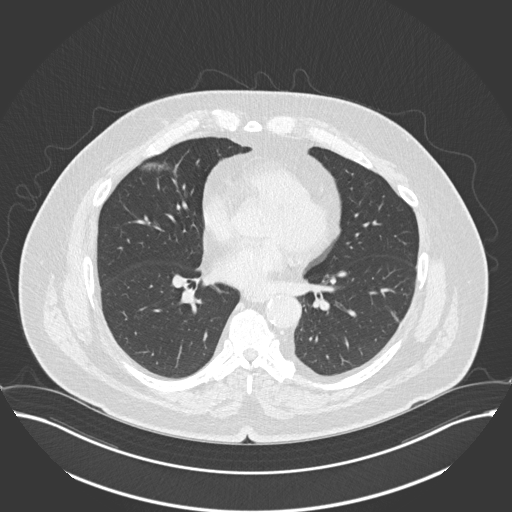
[im 93/167  lung]
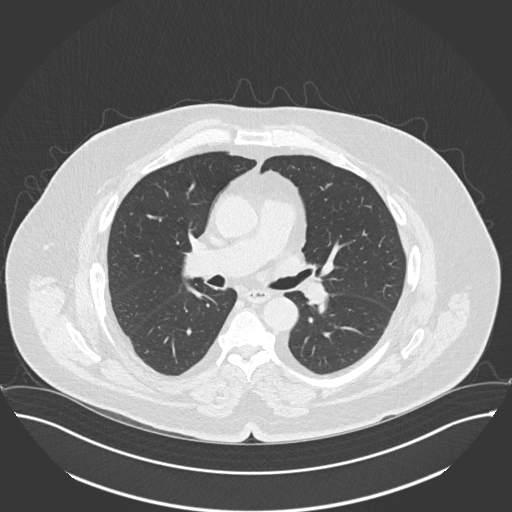
[im 105/167  lung]
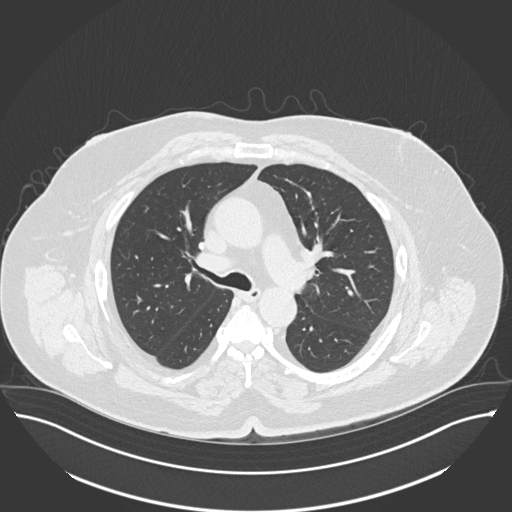
[im 117/167  mediastinal]
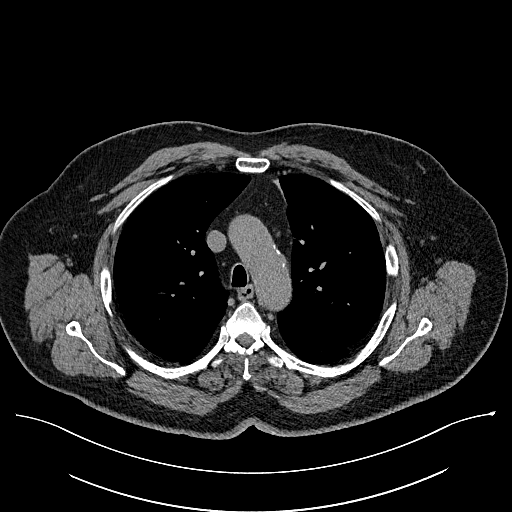
[im 117/167  lung]
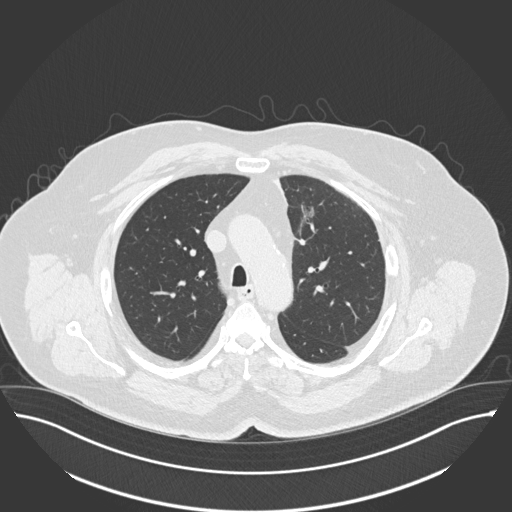
[im 130/167  lung]
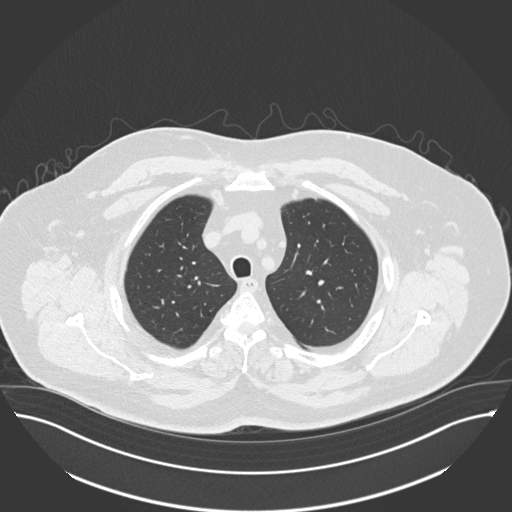
[im 142/167  lung]
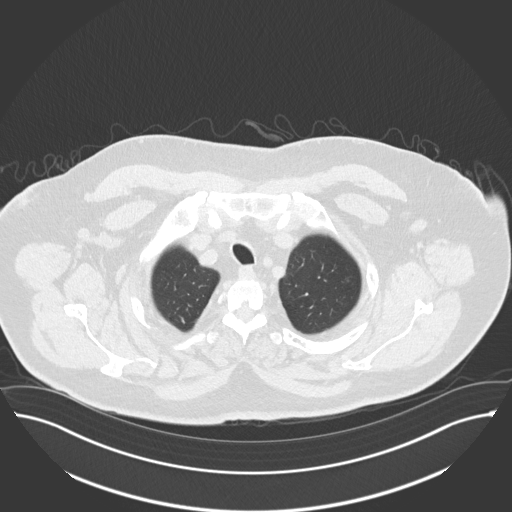
[im 154/167  lung]
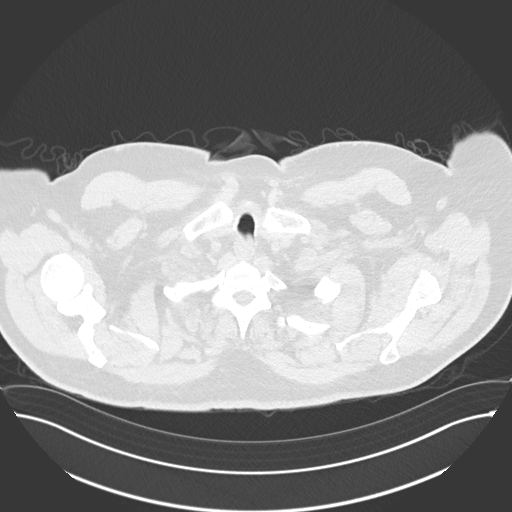

[Series 5: coronal · coronal · 0.67mm/px · 3 of 154 slices shown]
[im 31/154  lung]
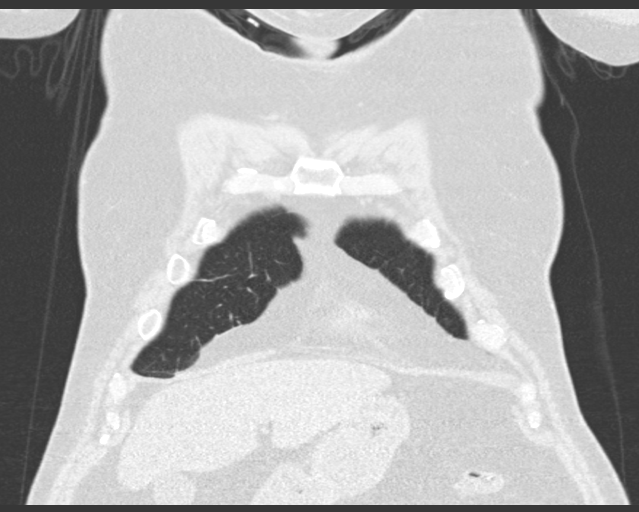
[im 62/154  lung]
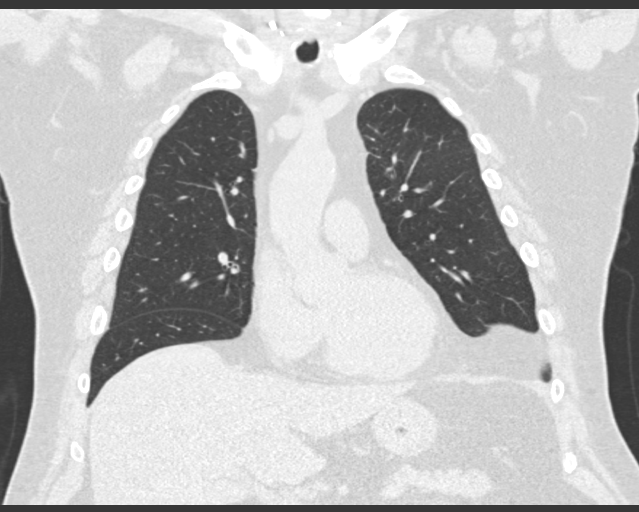
[im 92/154  lung]
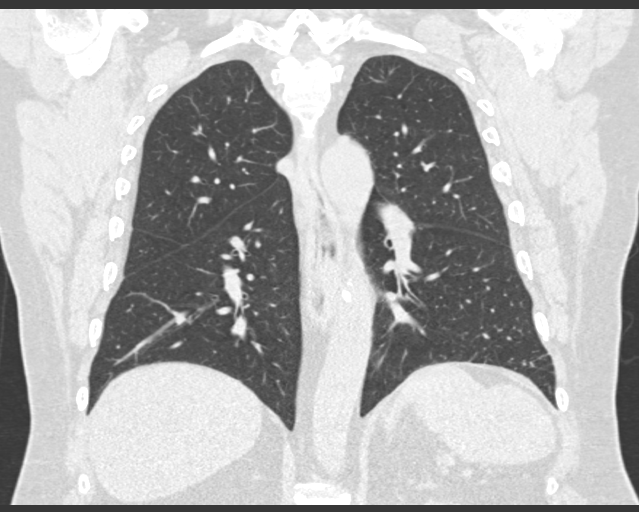

[15 of 36 positions shown; findings below may reference images not displayed]

FINDINGS: Cardiovascular: The heart size is normal. No substantial pericardial
effusion. Coronary artery calcification is evident. Atherosclerotic
calcification is noted in the wall of the thoracic aorta.

Mediastinum/Nodes: No mediastinal lymphadenopathy. No evidence for
gross hilar lymphadenopathy although assessment is limited by the
lack of intravenous contrast on today's study. The esophagus has
normal imaging features. There is no axillary lymphadenopathy.

Lungs/Pleura: 15 mm ground-glass opacity anterior left upper lobe on
the previous study is now 12 mm (52/3). Adjacent fiducial markers
noted. Stable scarring posterior left lower lobe. Subsegmental
atelectasis in the anterior right middle lobe is new in the
interval. No new suspicious pulmonary nodule or mass. No pleural
effusion.

Upper Abdomen: The liver shows diffusely decreased attenuation
suggesting fat deposition. Tiny gallstones evident.

Musculoskeletal: No worrisome lytic or sclerotic osseous
abnormality.
IMPRESSION: 1. Interval decrease in size of the anterior left upper lobe
ground-glass opacity with adjacent fiducial markers. No new or
progressive findings on today's study.
2. Hepatic steatosis.
3. Cholelithiasis.
4. Aortic Atherosclerosis ([FI]-[FI]).

## 2020-11-14 NOTE — Telephone Encounter (Signed)
appts made for pt per 11/14/20 los... pt aware that we/scheduling will call with scan and u/s appts once approved by ins... AOM

## 2020-11-14 NOTE — Progress Notes (Signed)
Oncology Nurse Navigator Documentation  Oncology Nurse Navigator Flowsheets 11/14/2020  Abnormal Finding Date -  Confirmed Diagnosis Date -  Diagnosis Status -  Planned Course of Treatment -  Phase of Treatment -  Radiation Actual Start Date: -  Radiation Actual End Date: -  Navigator Follow Up Date: 02/20/2021  Navigator Follow Up Reason: Follow-up Appointment;Scan Review  Navigator Location [facility]  Referral Date to RadOnc/MedOnc -  Navigator Encounter Type Appt/Treatment Plan Review  Telephone -  Treatment Initiated Date -  Patient Visit Type MedOnc  Treatment Phase Post-Tx Follow-up  Barriers/Navigation Needs Coordination of Care  Education -  Interventions Coordination of Care  Acuity Level 2-Minimal Needs (1-2 Barriers Identified)  Referrals -  Coordination of Care Radiology  Education Method -  Support Groups/Services Friends and Family  Time Spent with Patient 30

## 2020-11-14 NOTE — Progress Notes (Signed)
[name]  Timothy Conway [id number] [birth year] 64 y.o. 11/14/2020   Principle Diagnosis:   Stage Ia (T1N0M0) adenocarcinoma of the left upper lung  Current Therapy:    Status post radiosurgery-5400 rad that was completed in 08/20/2020     Interim History:  Timothy Conway is back for follow-up.  Overall, he is doing pretty well.  He really has had no problems since we last saw him.  We did go ahead and do a CT scan of his chest today.  This was without contrast.  The area in his left upper lung actually had decreased in size.  It was 15 mm.  Now it is 12 mm.  He is more worried about the fact that he had a CT scan done back in August.  This showed carotid artery stenosis in the left internal carotid artery.  I am not sure exactly what this means.  He probably is going to need Dopplers of his neck.  He has had a good appetite.  He is losing weight.  He is trying to lose weight.  He has had no change in bowel or bladder habits.  He has had no bleeding or bruising.  He has had no fever.  Overall, his performance status is ECOG 1.    Medications:  Current Outpatient Medications:  .  albuterol (VENTOLIN HFA) 108 (90 Base) MCG/ACT inhaler, INHALE 2 PUFFS INTO THE LUNGS EVERY 6 HOURS AS NEEDED FOR WHEEZING OR SHORTNESS OF BREATH, Disp: 18 g, Rfl: 3 .  ALPRAZolam (XANAX) 1 MG tablet, Take 1 mg by mouth daily., Disp: , Rfl:  .  alprazolam (XANAX) 2 MG tablet, Take 2 mg by mouth 3 (three) times daily as needed (anxiety.). , Disp: , Rfl:  .  ARIPiprazole (ABILIFY) 20 MG tablet, Take 20 mg by mouth at bedtime. , Disp: , Rfl:  .  aspirin EC 81 MG tablet, Take 81 mg by mouth daily. Swallow whole., Disp: , Rfl:  .  atenolol (TENORMIN) 100 MG tablet, Take 100 mg by mouth 2 (two) times daily., Disp: , Rfl:  .  CALCIUM PO, Take 1 tablet by mouth daily., Disp: , Rfl:  .  Cholecalciferol (VITAMIN D) 2000 units tablet, Take 2,000 Units by mouth daily., Disp: , Rfl:  .   fenofibrate 160 MG tablet, Take 160 mg by mouth daily with supper. , Disp: , Rfl:  .  fluticasone (FLONASE) 50 MCG/ACT nasal spray, Place 2 sprays into both nostrils daily as needed for allergies or rhinitis., Disp: , Rfl:  .  hydrocortisone 2.5 % cream, Apply 1 application topically 2 (two) times daily as needed., Disp: , Rfl:  .  levothyroxine (SYNTHROID) 150 MCG tablet, Take 1 tablet (150 mcg total) by mouth daily., Disp: 45 tablet, Rfl: 3 .  Multiple Vitamin (MULTIVITAMIN WITH MINERALS) TABS tablet, Take 1 tablet by mouth daily., Disp: , Rfl:  .  omeprazole (PRILOSEC) 20 MG capsule, Take 20 mg by mouth daily before breakfast. , Disp: , Rfl:  .  rosuvastatin (CRESTOR) 10 MG tablet, Take 10 mg by mouth daily. , Disp: , Rfl:  .  Tiotropium Bromide Monohydrate (SPIRIVA RESPIMAT) 2.5 MCG/ACT AERS, Inhale 2 puffs into the lungs daily., Disp: 4 g, Rfl: 11  Allergies: No Known Allergies  Past Medical History, Surgical history, Social history, and Family History were reviewed and updated.  Review of Systems: Review of Systems  Constitutional: Negative.   HENT:  Negative.   Eyes: Negative.  Respiratory: Negative.   Cardiovascular: Negative.   Gastrointestinal: Negative.  Negative for abdominal distention.  Endocrine: Negative.   Genitourinary: Negative.    Musculoskeletal: Positive for gait problem.  Skin: Negative.   Neurological: Positive for gait problem.  Hematological: Negative.   Psychiatric/Behavioral: Negative.     Physical Exam:  weight is 271 lb (122.9 kg). His oral temperature is 99.4 F (37.4 C). His blood pressure is 124/55 (abnormal) and his pulse is 73. His respiration is 20 and oxygen saturation is 96%.   Wt Readings from Last 3 Encounters:  11/14/20 271 lb (122.9 kg)  09/19/20 272 lb 3.2 oz (123.5 kg)  09/17/20 272 lb 12.8 oz (123.7 kg)    Physical Exam Vitals reviewed.  HENT:     Head: Normocephalic and atraumatic.  Eyes:     Pupils: Pupils are equal, round,  and reactive to light.  Cardiovascular:     Rate and Rhythm: Normal rate and regular rhythm.     Heart sounds: Normal heart sounds.  Pulmonary:     Effort: Pulmonary effort is normal.     Breath sounds: Normal breath sounds.  Abdominal:     General: Bowel sounds are normal.     Palpations: Abdomen is soft.  Musculoskeletal:        General: No tenderness or deformity. Normal range of motion.     Cervical back: Normal range of motion.  Lymphadenopathy:     Cervical: No cervical adenopathy.  Skin:    General: Skin is warm and dry.     Findings: No erythema or rash.  Neurological:     Mental Status: He is alert and oriented to person, place, and time.  Psychiatric:        Behavior: Behavior normal.        Thought Content: Thought content normal.        Judgment: Judgment normal.    Lab Results  Component Value Date   WBC 9.4 11/14/2020   HGB 15.9 11/14/2020   HCT 50.1 11/14/2020   MCV 94.9 11/14/2020   PLT 249 11/14/2020     Chemistry      Component Value Date/Time   NA 139 11/14/2020 1106   K 4.0 11/14/2020 1106   CL 102 11/14/2020 1106   CO2 29 11/14/2020 1106   BUN 18 11/14/2020 1106   CREATININE 1.23 11/14/2020 1106      Component Value Date/Time   CALCIUM 9.0 11/14/2020 1106   ALKPHOS 30 (L) 11/14/2020 1106   AST 16 11/14/2020 1106   ALT 14 11/14/2020 1106   BILITOT 0.5 11/14/2020 1106      Impression and Plan: Timothy Conway is a very nice 64 year old white male.  He had a nonsmall cell lung cancer of the left upper lung.  This was an adenocarcinoma.  It was a low-grade adenocarcinoma based on the low level of activity on the PET scan.  I we will repeat the CT scan in March.  I think this would be reasonable for continued follow-up.  We will have to do the Dopplers of his neck.  If he does have significant carotid artery stenosis on the left, we will have to see about getting him over to vascular surgery.  I am just happy that the radiosurgery has helped.    [name], MD 12/16/202112:27 PM

## 2020-11-15 ENCOUNTER — Other Ambulatory Visit: Payer: Self-pay | Admitting: Family

## 2020-11-15 DIAGNOSIS — C3492 Malignant neoplasm of unspecified part of left bronchus or lung: Secondary | ICD-10-CM

## 2020-11-27 ENCOUNTER — Other Ambulatory Visit: Payer: Self-pay | Admitting: Emergency Medicine

## 2020-11-27 MED ORDER — ALBUTEROL SULFATE HFA 108 (90 BASE) MCG/ACT IN AERS: INHALATION_SPRAY | RESPIRATORY_TRACT | 3 refills | Status: DC

## 2020-11-28 ENCOUNTER — Ambulatory Visit (HOSPITAL_COMMUNITY): Payer: Medicare HMO

## 2020-12-02 ENCOUNTER — Telehealth: Payer: Self-pay | Admitting: *Deleted

## 2020-12-02 ENCOUNTER — Other Ambulatory Visit: Payer: Self-pay

## 2020-12-02 ENCOUNTER — Ambulatory Visit (HOSPITAL_COMMUNITY)
Admission: RE | Admit: 2020-12-02 | Discharge: 2020-12-02 | Disposition: A | Discharge status: Home / Self Care | Payer: Medicare HMO | Source: Ambulatory Visit | Attending: Family | Admitting: Family

## 2020-12-02 DIAGNOSIS — C3492 Malignant neoplasm of unspecified part of left bronchus or lung: Secondary | ICD-10-CM

## 2020-12-02 DIAGNOSIS — I6522 Occlusion and stenosis of left carotid artery: Secondary | ICD-10-CM | POA: Diagnosis not present

## 2020-12-02 NOTE — Progress Notes (Signed)
Carotid artery duplex has been completed. Preliminary results can be found in CV Proc through chart review.   12/02/20 11:40 AM Timothy Conway RVT

## 2020-12-02 NOTE — Telephone Encounter (Signed)
Unable to reach pt. lmovm for pt with results.

## 2020-12-02 NOTE — Telephone Encounter (Signed)
-----   Message from [name], MD sent at 12/02/2020  2:26 PM EST ----- Call - there is only mild stenosis in the carotid arteries!!!  This is good!!  Film/video editor

## 2020-12-18 ENCOUNTER — Other Ambulatory Visit: Payer: Medicare HMO

## 2020-12-23 ENCOUNTER — Other Ambulatory Visit: Payer: Self-pay

## 2020-12-25 ENCOUNTER — Other Ambulatory Visit: Payer: Self-pay

## 2020-12-25 ENCOUNTER — Ambulatory Visit: Payer: Medicare HMO | Admitting: Internal Medicine

## 2020-12-25 ENCOUNTER — Encounter: Payer: Self-pay | Admitting: Internal Medicine

## 2020-12-25 VITALS — BP 128/88 | HR 57 | Ht 72.0 in | Wt 264.2 lb

## 2020-12-25 DIAGNOSIS — E890 Postprocedural hypothyroidism: Secondary | ICD-10-CM

## 2020-12-25 DIAGNOSIS — C73 Malignant neoplasm of thyroid gland: Secondary | ICD-10-CM

## 2020-12-25 LAB — T4, FREE: Free T4: 1.19 ng/dL (ref 0.60–1.60)

## 2020-12-25 LAB — TSH: TSH: 0.76 u[IU]/mL (ref 0.35–4.50)

## 2020-12-25 NOTE — Progress Notes (Signed)
Patient ID: Timothy Conway, male   DOB: 1956-06-15, 65 y.o.   MRN: [id number]   This visit occurred during the SARS-CoV-2 public health emergency.  Safety protocols were in place, including screening questions prior to the visit, additional usage of staff PPE, and extensive cleaning of exam room while observing appropriate contact time as indicated for disinfecting solutions.   HPI  Timothy Conway is a 65 y.o.-year-old male, initially referred by Dr. Harlow Asa, presenting for follow-up for papillary thyroid cancer and postsurgical hypothyroidism.  Last visit 3 months ago.  Last year, he was diagnosed with 11 mm left adenocarcinoma. He had radiotactic RxTx.  He continues to be followed by oncology.  He had a CT scan since last visit that showed a decrease in size of his tumor.  Reviewed and addended his thyroid cancer history: Pt. has been found to have a hypermetabolic thyroid nodule on a PET scan (07/13/2017) obtained by Dr. Lamonte Sakai, his pulmonologist, to investigate a lung nodule.  A thyroid U/S (07/30/2017) showed a L lobe 1.3 x 1.0 x 1.2 cm nodule, solid, hypoechoic, lobulated/irregular, with peripheral calcifications. Bx of this nodule (07/30/2017): PTC  Total thyroidectomy (08/27/2017) by Dr. Harlow Asa: Diagnosis 1. Thyroid, thyroidectomy, total - PAPILLARY THYROID CARCINOMA, CLASSIC TYPE, SPANNING 1.4 CM. - NO EXTRATHYROIDAL EXTENSION. - RESECTION MARGINS ARE NEGATIVE. - SEE ONCOLOGY TABLE. 2. Lymph node, biopsy, central compartment - THREE OF THREE LYMPH NODES NEGATIVE FOR CARCINOMA (0/3). - ONE BENIGN PARATHYROID. Microscopic Comment 1. THYROID Specimen: Total thyroid with central compartment lymph nodes. Procedure (including lymph node sampling if applicable): Total thyroidectomy with central lymph node biopsy. Specimen Integrity (intact/fragmented): Intact. Tumor focality: Unifocal. Dominant tumor: Maximum tumor size (cm): 1.4 cm. Tumor laterality: Left. Histologic type  (including subtype and/or unique features as applicable): Papillary thyroid carcinoma, classic type. Tumor capsule: Present. Extrathyroidal extension: Absent. Capsular invasion with degree of invasion if present: N/A. Margins: Negative. Lymphatic or vascular invasion: Not identified. Lymph nodes: # examined 3; # positive; 0 Extracapsular extension (if applicable): N/A. TNM code: pT1b, pN0 Non-neoplastic thyroid: Largely unremarkable. Comments: There tumor is largely fibrotic and calcified with only a small portion of viable tumor.  Neck U/S (09/25/2019): The thyroid gland is surgically absent. Evaluation of the left and right resection bed demonstrates no evidence of residual thyroid tissue or nodularity. No suspicious lymphadenopathy is identified. IMPRESSION: Surgical changes of prior total thyroidectomy without evidence of residual or recurrent thyroid tissue, nodularity or lymphadenopathy.  PET/CT (09/15/2020): Increased uptake in the base of the tongue and anterior paravertebral muscles  CT neck (07/24/2020): Increased uptake is most likely related to tonsillar inflammation  CT chest (11/14/2020) without contrast: No suspicious masses in mediastinum or neck.  Thyroglobulin is detectable Lab Results  Component Value Date   THYROGLB 0.1 (L) 10/29/2020   THYROGLB 0.7 (L) 09/17/2020   THYROGLB 0.1 (L) 09/14/2019   THYROGLB 0.2 (L) 09/08/2018   THYROGLB 0.1 (L) 03/23/2018   His antithyroid antibodies are undetectable: Lab Results  Component Value Date   THGAB <1 10/29/2020   THGAB <1 09/17/2020   THGAB <1 09/14/2019   THGAB <1 09/08/2018   THGAB <1 03/23/2018   Postsurgical hypothyroidism  At last visit, we increased his levothyroxine dose from 137 to 150 mcg daily.  He takes this: - in am - fasting - at least 30 min from b'fast (however, he usually skips breakfast) - + calcium 2X A DAY -first dose in p.m. - no iron - + multivitamins at night - + PPIs  at  night - not on Biotin + Magnesium and Zinc.  Reviewed his TFTs: Lab Results  Component Value Date   TSH 0.28 (L) 10/29/2020   TSH 12.27 (H) 09/17/2020   TSH 0.64 09/14/2019   TSH 4.66 (H) 09/08/2018   TSH 0.52 03/23/2018   TSH 12.75 (H) 02/11/2018   TSH 2.01 11/12/2017   TSH 3.67 10/01/2017   FREET4 1.13 10/29/2020   FREET4 0.77 09/17/2020   FREET4 1.04 09/14/2019   FREET4 0.73 09/08/2018   FREET4 0.87 03/23/2018   FREET4 0.62 02/11/2018   FREET4 0.70 11/12/2017   FREET4 0.77 10/01/2017    Pt denies: - feeling nodules in neck - hoarseness - dysphagia - choking - SOB with lying down  Unknown family history of thyroid disease or thyroid cancer since he was adopted. No h/o radiation tx to head or neck.  No herbal supplements. No Biotin use. No recent steroids use.   ROS: Constitutional: + Intentional weight loss, + fatigue, no subjective hyperthermia, no subjective hypothermia Eyes: no blurry vision, no xerophthalmia ENT: no sore throat, + see HPI Cardiovascular: no CP/no SOB/no palpitations/no leg swelling Respiratory: no cough/no SOB/no wheezing Gastrointestinal: no N/no V/no D/no C/no acid reflux Musculoskeletal: no muscle aches/no joint aches Skin: no rashes, no hair loss Neurological: + tremors/no numbness/no tingling/no dizziness  I reviewed pt's medications, allergies, PMH, social hx, family hx, and changes were documented in the history of present illness. Otherwise, unchanged from my initial visit note.  Past Medical History:  Diagnosis Date  . Anxiety   . COPD (chronic obstructive pulmonary disease) ([place])   . Depression   . Dry eye syndrome   . Dysrhythmia    hx of PAC's  . GERD (gastroesophageal reflux disease)   . Gout   . Hyperlipidemia   . Hypertension   . Hypothyroidism   . Thyroid cancer ([place])    2018   Past Surgical History:  Procedure Laterality Date  . ABDOMINAL SURGERY     hemmoraging ulcer  . BRONCHIAL BIOPSY  06/25/2020    Procedure: BRONCHIAL BIOPSIES;  Surgeon: Collene Gobble, MD;  Location: [facility] ENDOSCOPY;  Service: Pulmonary;;  . BRONCHIAL BRUSHINGS  06/25/2020   Procedure: BRONCHIAL BRUSHINGS;  Surgeon: Collene Gobble, MD;  Location: [facility] ENDOSCOPY;  Service: Pulmonary;;  . BRONCHIAL NEEDLE ASPIRATION BIOPSY  06/25/2020   Procedure: BRONCHIAL NEEDLE ASPIRATION BIOPSIES;  Surgeon: Collene Gobble, MD;  Location: [facility] ENDOSCOPY;  Service: Pulmonary;;  . BRONCHIAL WASHINGS  06/25/2020   Procedure: BRONCHIAL WASHINGS;  Surgeon: Collene Gobble, MD;  Location: [facility] ENDOSCOPY;  Service: Pulmonary;;  . CATARACT EXTRACTION Right   . COLONOSCOPY    . EYE SURGERY Right    x 4  between ages 44-13  . FIDUCIAL MARKER PLACEMENT  06/25/2020   Procedure: FIDUCIAL MARKER PLACEMENT;  Surgeon: Collene Gobble, MD;  Location: [facility] ENDOSCOPY;  Service: Pulmonary;;  . THYROIDECTOMY N/A 08/27/2017   Procedure: TOTAL THYROIDECTOMY WITH LIMITED LYMPH NODE DISSECTION;  Surgeon: Armandina Gemma, MD;  Location: [facility] ORS;  Service: General;  Laterality: N/A;  . UPPER GI ENDOSCOPY    . VIDEO BRONCHOSCOPY WITH ENDOBRONCHIAL NAVIGATION N/A 06/25/2020   Procedure: VIDEO BRONCHOSCOPY WITH ENDOBRONCHIAL NAVIGATION;  Surgeon: Collene Gobble, MD;  Location: [facility] ENDOSCOPY;  Service: Pulmonary;  Laterality: N/A;   Social History   Social History  . Marital status: Single    Spouse name: N/A  . Number of children: 0   Occupational History  . n/a  Social History Main Topics  . Smoking status: Current Every Day Smoker    Packs/day: 1.00    Years: 40.00    Types: Cigarettes  . Smokeless tobacco: Never Used     Comment: 1 1/2 per day  . Alcohol use 0.6 oz/week    3-4 Cans of beer per day  . Drug use: No   Current Outpatient Medications on File Prior to Visit  Medication Sig Dispense Refill  . albuterol (VENTOLIN HFA) 108 (90 Base) MCG/ACT inhaler INHALE 2 PUFFS INTO THE LUNGS EVERY 6 HOURS AS NEEDED FOR WHEEZING OR SHORTNESS OF BREATH 18 g 3  .  ALPRAZolam (XANAX) 1 MG tablet Take 1 mg by mouth daily.    Marland Kitchen alprazolam (XANAX) 2 MG tablet Take 2 mg by mouth 3 (three) times daily as needed (anxiety.).     Marland Kitchen ARIPiprazole (ABILIFY) 20 MG tablet Take 20 mg by mouth at bedtime.     Marland Kitchen aspirin EC 81 MG tablet Take 81 mg by mouth daily. Swallow whole.    Marland Kitchen atenolol (TENORMIN) 100 MG tablet Take 100 mg by mouth 2 (two) times daily.    Marland Kitchen CALCIUM PO Take 1 tablet by mouth daily.    . Cholecalciferol (VITAMIN D) 2000 units tablet Take 2,000 Units by mouth daily.    . fenofibrate 160 MG tablet Take 160 mg by mouth daily with supper.     . fluticasone (FLONASE) 50 MCG/ACT nasal spray Place 2 sprays into both nostrils daily as needed for allergies or rhinitis.    . hydrocortisone 2.5 % cream Apply 1 application topically 2 (two) times daily as needed.    Marland Kitchen levothyroxine (SYNTHROID) 150 MCG tablet Take 1 tablet (150 mcg total) by mouth daily. 45 tablet 3  . Multiple Vitamin (MULTIVITAMIN WITH MINERALS) TABS tablet Take 1 tablet by mouth daily.    Marland Kitchen omeprazole (PRILOSEC) 20 MG capsule Take 20 mg by mouth daily before breakfast.     . rosuvastatin (CRESTOR) 10 MG tablet Take 10 mg by mouth daily.     . Tiotropium Bromide Monohydrate (SPIRIVA RESPIMAT) 2.5 MCG/ACT AERS Inhale 2 puffs into the lungs daily. 4 g 11   No current facility-administered medications on file prior to visit.   No Known Allergies Family History  Adopted: Yes    PE: BP 128/88   Pulse (!) 57   Ht 6' (1.829 m)   Wt 264 lb 3.2 oz (119.8 kg)   SpO2 95%   BMI 35.83 kg/m  Wt Readings from Last 3 Encounters:  12/25/20 264 lb 3.2 oz (119.8 kg)  11/14/20 271 lb (122.9 kg)  09/19/20 272 lb 3.2 oz (123.5 kg)   Constitutional: overweight, in NAD Eyes: PERRLA, EOMI, no exophthalmos ENT: moist mucous membranes, no neck masses palpated, no cervical lymphadenopathy Cardiovascular: RRR, No MRG Respiratory: CTA B Gastrointestinal: abdomen soft, NT, ND, BS+ Musculoskeletal: no  deformities, strength intact in all 4 Skin: moist, warm, no rashes Neurological: + tremor with outstretched hands, DTR normal in all 4  ASSESSMENT: 1. Thyroid cancer - see HPI  2. Postsurgical Hypothyroidism  PLAN:  1. Papillary Thyroid Cancer -Patient with history of classic type of papillary thyroid cancer, not very large, encapsulated, without extrathyroidal extension, without lymphovascular invasion and with negative lymph node biopsies.  Therefore, his cancer was considered low risk and we decided to continue without RAI treatment per the 2015 ATA thyroid cancer guidelines.  We are following him with thyroglobulin tumor marker and also neck ultrasound.  We discussed that even if he ends up needing RAI treatment as a salvage therapy in the future, his prognosis would not be changed -Latest neck ultrasound did not show any metastasis or recurrence in the neck.  Also, a new PET scan and CT scan obtained before last visit to stage his lung cancer showed no thyroid cancer recurrences or metastases -His thyroglobulin levels remain detectable, as expected, since he did not have RAI treatment.  At last visit, thyroglobulin was slightly higher, at 0.7.  His ATA antibodies are undetectable.  However, we repeated his thyroglobulin 1.5 months later and this decreased to barely detectable, at 0.1. -We will continue to follow her thyroglobulin trends.  We will recheck this at next visit -I will see him back in 6 months  2. Patient with history of total thyroidectomy for thyroid cancer, now with iatrogenic hypothyroidism, on Synthroid.  He had fatigue in the past, which improved with increasing his dose of Synthroid. - latest thyroid labs reviewed with pt >> TSH slightly suppressed: Lab Results  Component Value Date   TSH 0.28 (L) 10/29/2020   - he continues on LT4 150 mcg daily - pt feels good on this dose.  He lost 8 pounds intentionally since last visit.  He does have mild tremors, which are not  new, but denies heat intolerance or palpitations. - we discussed about taking the thyroid hormone every day, with water, >30 minutes before breakfast, separated by >4 hours from acid reflux medications, calcium, iron, multivitamins. Pt. is taking it correctly. - will check thyroid tests today: TSH and fT4.  I advised him that we may need to use alternating levothyroxine doses, depending on the results. - If labs are abnormal, he will need to return for repeat TFTs in 1.5 months  Component     Latest Ref Rng & Units 12/25/2020  TSH     0.35 - 4.50 uIU/mL 0.76  T4,Free(Direct)     0.60 - 1.60 ng/dL 1.19  Perfect TFTs >> continue LT4 150 mcg daily.  Philemon Kingdom, MD PhD [facility] Endocrinology

## 2020-12-25 NOTE — Patient Instructions (Signed)
Patient Instructions  Please continue Synthroid 150 mcg daily  Take the thyroid hormone every day, with water, at least 30 minutes before breakfast, separated by at least 4 hours from: - acid reflux medications - calcium - iron - multivitamins  Please stop at the lab.  Please come back for a follow-up appointment in 6 months.

## 2021-01-29 ENCOUNTER — Telehealth: Payer: Self-pay

## 2021-01-29 NOTE — Telephone Encounter (Signed)
Per secure chat message with tara 01/29/21 his ins will not cover scans to be done here at [place] and need to be done at [facility] or [facility].    I s/w the pt and he req [facility] and I called and left him a vm that I moved his labs a little earlier and that his scan is at 1:00.      Webb Silversmith

## 2021-01-29 NOTE — Telephone Encounter (Signed)
Returned pts call to verify  appts as my vm was not clear, pt is aware of appts as sch and a calendar has been mailed to him as well    [place]

## 2021-02-07 DIAGNOSIS — M791 Myalgia, unspecified site: Secondary | ICD-10-CM | POA: Diagnosis not present

## 2021-02-07 DIAGNOSIS — W19XXXA Unspecified fall, initial encounter: Secondary | ICD-10-CM | POA: Diagnosis not present

## 2021-02-07 DIAGNOSIS — R531 Weakness: Secondary | ICD-10-CM | POA: Diagnosis not present

## 2021-02-20 ENCOUNTER — Other Ambulatory Visit (HOSPITAL_BASED_OUTPATIENT_CLINIC_OR_DEPARTMENT_OTHER): Payer: Medicare HMO

## 2021-02-20 ENCOUNTER — Ambulatory Visit: Payer: Medicare HMO | Admitting: Hematology & Oncology

## 2021-02-20 ENCOUNTER — Other Ambulatory Visit: Payer: Medicare HMO

## 2021-02-21 ENCOUNTER — Other Ambulatory Visit (HOSPITAL_BASED_OUTPATIENT_CLINIC_OR_DEPARTMENT_OTHER): Payer: Medicare HMO

## 2021-02-21 ENCOUNTER — Inpatient Hospital Stay: Payer: Medicare HMO | Attending: Hematology & Oncology | Admitting: Hematology & Oncology

## 2021-02-21 ENCOUNTER — Other Ambulatory Visit: Payer: Self-pay

## 2021-02-21 ENCOUNTER — Ambulatory Visit
Admission: RE | Admit: 2021-02-21 | Discharge: 2021-02-21 | Disposition: A | Discharge status: Home / Self Care | Payer: Medicare HMO | Source: Ambulatory Visit | Attending: Hematology & Oncology | Admitting: Hematology & Oncology

## 2021-02-21 ENCOUNTER — Inpatient Hospital Stay: Payer: Medicare HMO

## 2021-02-21 ENCOUNTER — Telehealth: Payer: Self-pay

## 2021-02-21 ENCOUNTER — Encounter: Payer: Self-pay | Admitting: *Deleted

## 2021-02-21 ENCOUNTER — Encounter: Payer: Self-pay | Admitting: Hematology & Oncology

## 2021-02-21 ENCOUNTER — Other Ambulatory Visit: Payer: Medicare HMO

## 2021-02-21 VITALS — BP 132/59 | HR 58 | Temp 99.0°F | Resp 16 | Wt 272.0 lb

## 2021-02-21 DIAGNOSIS — Z7951 Long term (current) use of inhaled steroids: Secondary | ICD-10-CM | POA: Insufficient documentation

## 2021-02-21 DIAGNOSIS — C3492 Malignant neoplasm of unspecified part of left bronchus or lung: Secondary | ICD-10-CM

## 2021-02-21 DIAGNOSIS — I700 Atherosclerosis of aorta: Secondary | ICD-10-CM | POA: Diagnosis not present

## 2021-02-21 DIAGNOSIS — Z79899 Other long term (current) drug therapy: Secondary | ICD-10-CM | POA: Diagnosis not present

## 2021-02-21 DIAGNOSIS — Z923 Personal history of irradiation: Secondary | ICD-10-CM | POA: Insufficient documentation

## 2021-02-21 DIAGNOSIS — C3412 Malignant neoplasm of upper lobe, left bronchus or lung: Secondary | ICD-10-CM | POA: Diagnosis not present

## 2021-02-21 DIAGNOSIS — Z7982 Long term (current) use of aspirin: Secondary | ICD-10-CM | POA: Insufficient documentation

## 2021-02-21 DIAGNOSIS — J984 Other disorders of lung: Secondary | ICD-10-CM | POA: Diagnosis not present

## 2021-02-21 DIAGNOSIS — C3490 Malignant neoplasm of unspecified part of unspecified bronchus or lung: Secondary | ICD-10-CM | POA: Diagnosis not present

## 2021-02-21 DIAGNOSIS — E890 Postprocedural hypothyroidism: Secondary | ICD-10-CM | POA: Diagnosis not present

## 2021-02-21 LAB — CMP (CANCER CENTER ONLY)
ALT: 12 U/L (ref 0–44)
AST: 16 U/L (ref 15–41)
Albumin: 4.3 g/dL (ref 3.5–5.0)
Alkaline Phosphatase: 32 U/L — ABNORMAL LOW (ref 38–126)
Anion gap: 7 (ref 5–15)
BUN: 19 mg/dL (ref 8–23)
CO2: 29 mmol/L (ref 22–32)
Calcium: 9.3 mg/dL (ref 8.9–10.3)
Chloride: 104 mmol/L (ref 98–111)
Creatinine: 1.15 mg/dL (ref 0.61–1.24)
GFR, Estimated: >60 mL/min (ref >60)
Glucose, Bld: 102 mg/dL — ABNORMAL HIGH (ref 70–99)
Potassium: 4.4 mmol/L (ref 3.5–5.1)
Sodium: 140 mmol/L (ref 135–145)
Total Bilirubin: 0.5 mg/dL (ref 0.3–1.2)
Total Protein: 7.3 g/dL (ref 6.5–8.1)

## 2021-02-21 LAB — CBC WITH DIFFERENTIAL (CANCER CENTER ONLY)
Abs Immature Granulocytes: 0.03 10*3/uL (ref 0.00–0.07)
Basophils Absolute: 0.1 10*3/uL (ref 0.0–0.1)
Basophils Relative: 1 %
Eosinophils Absolute: 0.2 10*3/uL (ref 0.0–0.5)
Eosinophils Relative: 2 %
HCT: 47.2 % (ref 39.0–52.0)
Hemoglobin: 15 g/dL (ref 13.0–17.0)
Immature Granulocytes: 0 %
Lymphocytes Relative: 29 %
Lymphs Abs: 2.2 10*3/uL (ref 0.7–4.0)
MCH: 30.2 pg (ref 26.0–34.0)
MCHC: 31.8 g/dL (ref 30.0–36.0)
MCV: 95.2 fL (ref 80.0–100.0)
Monocytes Absolute: 0.4 10*3/uL (ref 0.1–1.0)
Monocytes Relative: 6 %
Neutro Abs: 4.6 10*3/uL (ref 1.7–7.7)
Neutrophils Relative %: 62 %
Platelet Count: 241 10*3/uL (ref 150–400)
RBC: 4.96 MIL/uL (ref 4.22–5.81)
RDW: 12.9 % (ref 11.5–15.5)
WBC Count: 7.4 10*3/uL (ref 4.0–10.5)
nRBC: 0 % (ref 0.0–0.2)

## 2021-02-21 IMAGING — CT CT CHEST W/O CM
2 of 4 series · 15 of 36 positions shown, 18 images · non-contrast
Comparison: [DATE]

CLINICAL DATA: Non-small cell lung cancer, assess treatment
response, status post radius surgery for left upper lobe nodule

EXAM:
CT CHEST WITHOUT CONTRAST
TECHNIQUE: Multidetector CT imaging of the chest was performed following the
standard protocol without IV contrast.

[Series 2: chest 2.00 br40 s3 · axial · 0.87mm/px · z∈[+1649,+1935]mm · 12 of 170 slices shown, 15 images (1 of 2)]
[im 14/170  mediastinal]
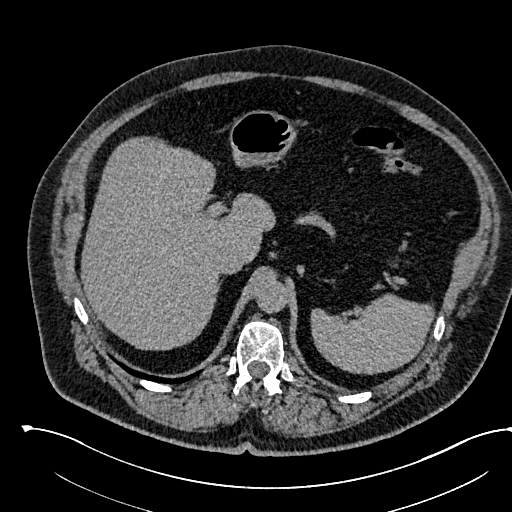
[im 14/170  lung]
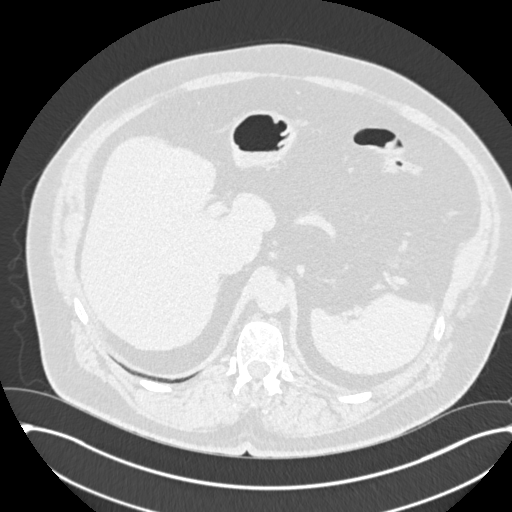
[im 27/170  lung]
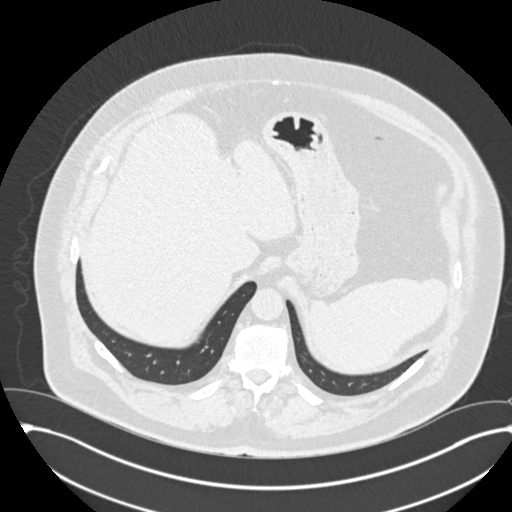
[im 40/170  lung]
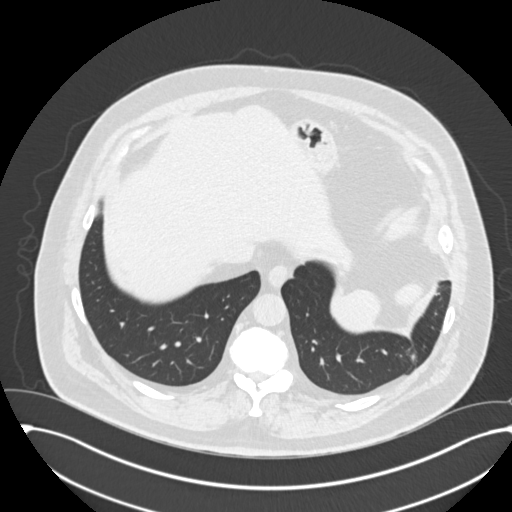
[im 53/170  lung]
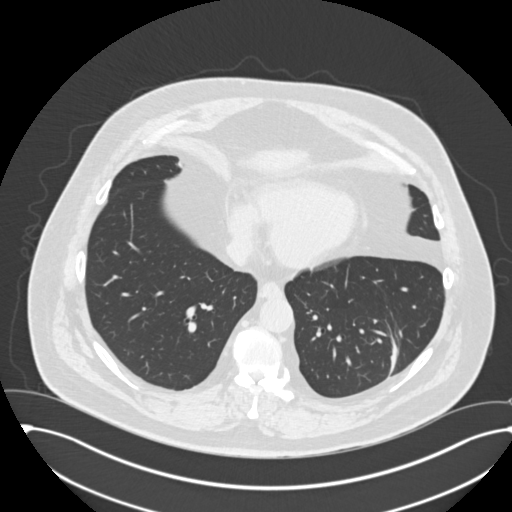
[im 66/170  mediastinal]
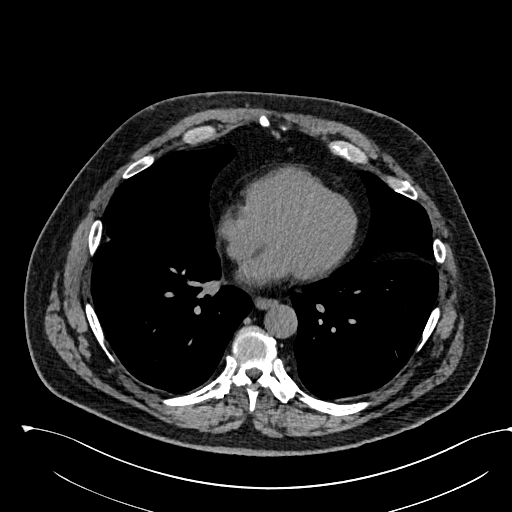
[im 66/170  lung]
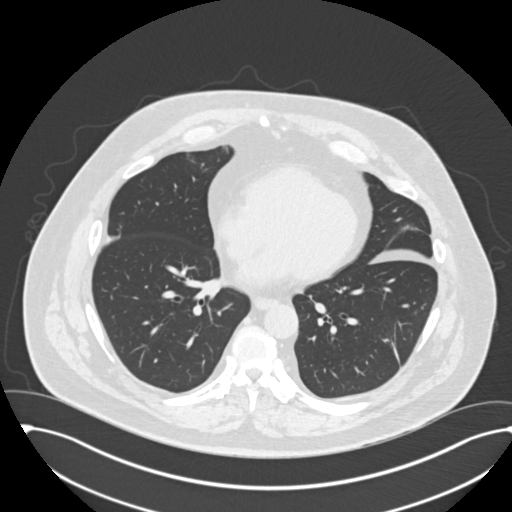
[im 79/170  lung]
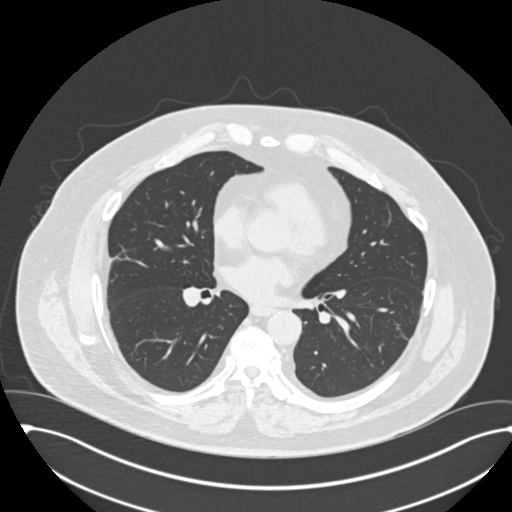
[im 92/170  lung]
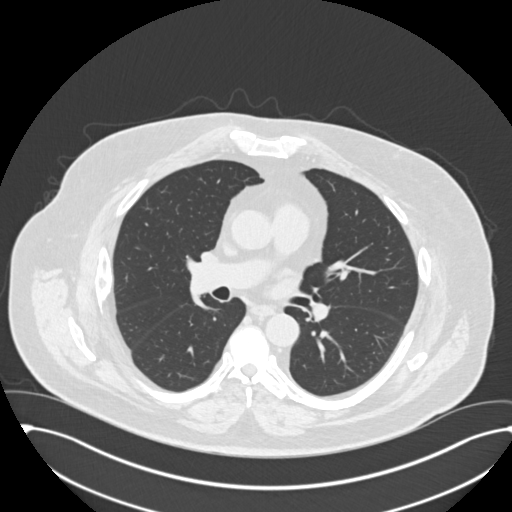
[im 105/170  lung]
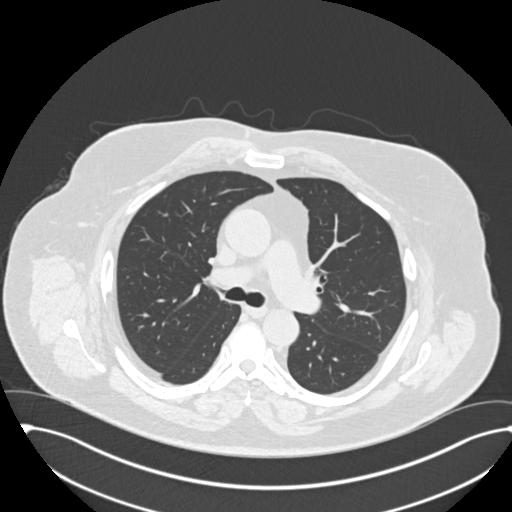
[im 118/170  mediastinal]
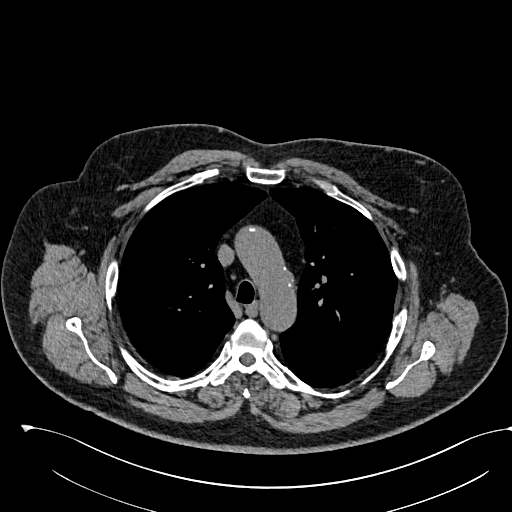
[im 118/170  lung]
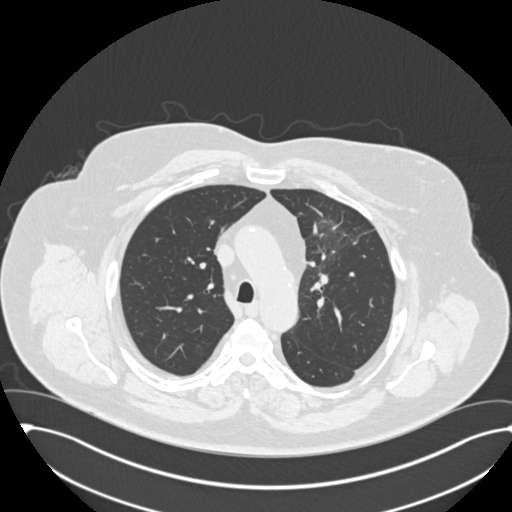
[im 131/170  lung]
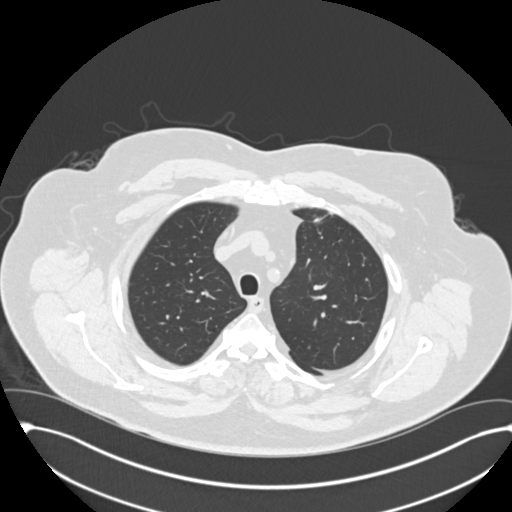
[im 144/170  lung]
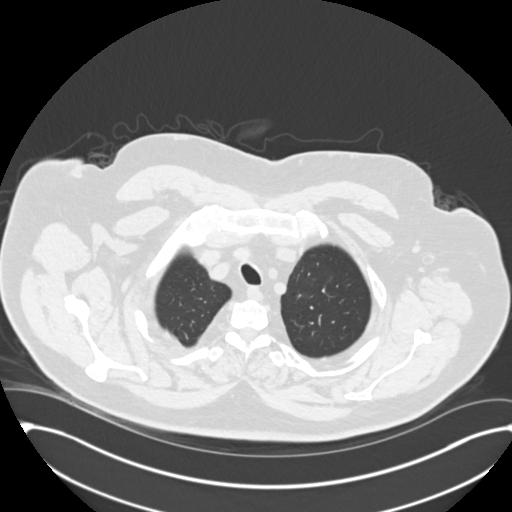
[im 157/170  lung]
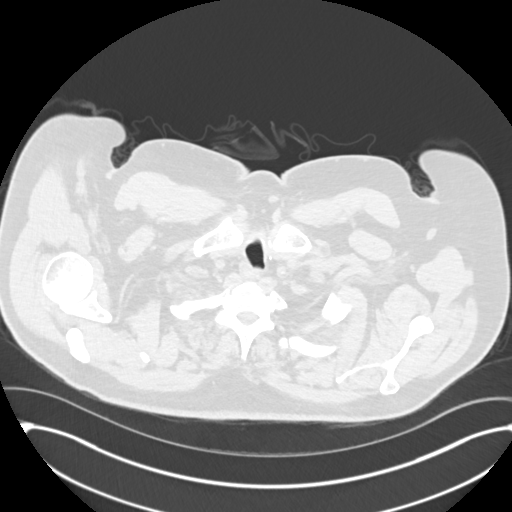

[Series 4: chest 2.00 br40 s3 · coronal · 0.67mm/px · 3 of 220 slices shown (2 of 2)]
[im 44/220  lung]
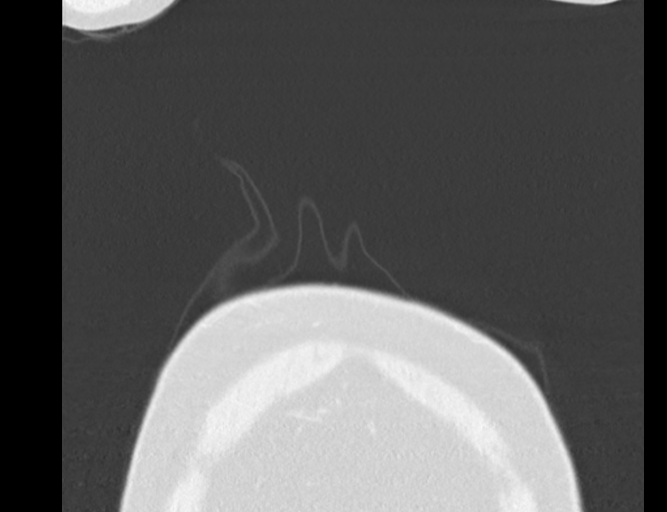
[im 88/220  lung]
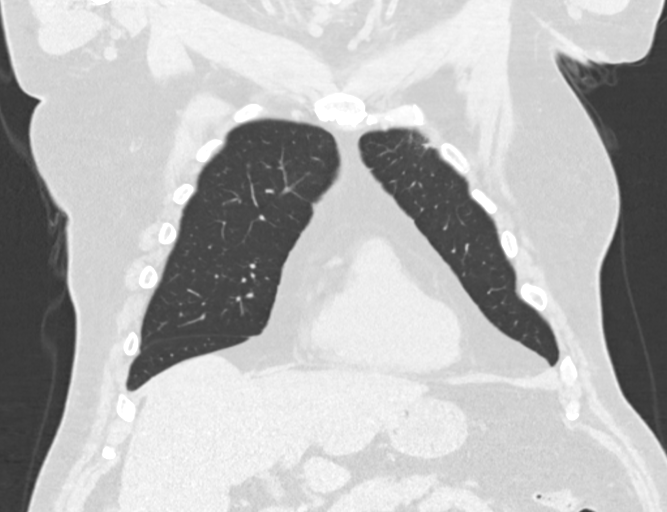
[im 132/220  lung]
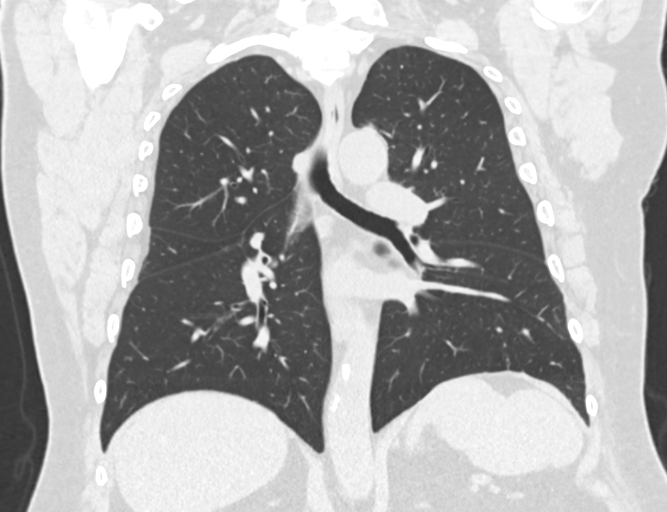

[15 of 36 positions shown; findings below may reference images not displayed]

FINDINGS: Cardiovascular: Aortic atherosclerosis. Normal heart size. No
pericardial effusion.

Mediastinum/Nodes: No enlarged mediastinal, hilar, or axillary lymph
nodes. Status post thyroidectomy. Trachea, and esophagus demonstrate
no significant findings.

Lungs/Pleura: A previously noted ground-glass pulmonary nodule of
the anterior left upper lobe can no longer be discretely appreciated
against a background of ground-glass and developing radiation
fibrosis. Redemonstrated fiducial markers. Bandlike scarring of the
dependent left lung base and lateral segment right middle lobe. No
pleural effusion or pneumothorax.

Upper Abdomen: No acute abnormality.

Musculoskeletal: No chest wall mass or suspicious bone lesions
identified.
IMPRESSION: 1. A previously noted ground-glass pulmonary nodule of the anterior
left upper lobe can no longer be discretely appreciated against a
background of ground-glass radiation pneumonitis and early
developing radiation fibrosis. Attention on follow-up.
2. No evidence of lymphadenopathy or metastatic disease in the
chest.

Aortic Atherosclerosis ([DF]-[DF]).

## 2021-02-21 NOTE — Telephone Encounter (Signed)
Appts made and printed for pt per 02/21/21   [facility]

## 2021-02-21 NOTE — Progress Notes (Signed)
[name]  Timothy Conway [id number] 26-Sep-1956 65 y.o. 02/21/2021   Principle Diagnosis:   Stage Ia (T1N0M0) adenocarcinoma of the left upper lung  Current Therapy:    Status post radiosurgery-5400 rad that was completed in 08/20/2020     Interim History:  Timothy Conway is back for follow-up.  He is doing quite nicely.  We did go ahead and do a CT scan on him today.  This was done of the chest.  Thankfully, the CT scan showed a resolution of the left upper lobe nodule.  The post radiation therapy changes were resolving.  He has had no problems with cough.  He did note a lesion in the bottom of his mouth.  I looked at this area.  It looks like this might be where he has a salivary gland duct into his oral cavity.  His appetite has been good.  He has had no problems with nausea or vomiting.  He has had no problems with fever.  He has had no change in bowel or bladder habits.  He has had no rashes.  He has had no leg swelling.  Overall, his performance status is ECOG 1.    Medications:  Current Outpatient Medications:  .  albuterol (VENTOLIN HFA) 108 (90 Base) MCG/ACT inhaler, INHALE 2 PUFFS INTO THE LUNGS EVERY 6 HOURS AS NEEDED FOR WHEEZING OR SHORTNESS OF BREATH, Disp: 18 g, Rfl: 3 .  alprazolam (XANAX) 2 MG tablet, Take 2 mg by mouth 3 (three) times daily as needed (anxiety.). Pt taking 1/2 to 1 pill three times a day as needed., Disp: , Rfl:  .  ARIPiprazole (ABILIFY) 20 MG tablet, Take 20 mg by mouth at bedtime. , Disp: , Rfl:  .  aspirin EC 81 MG tablet, Take 81 mg by mouth daily. Swallow whole., Disp: , Rfl:  .  atenolol (TENORMIN) 100 MG tablet, Take 100 mg by mouth 2 (two) times daily., Disp: , Rfl:  .  CALCIUM PO, Take 1 tablet by mouth daily., Disp: , Rfl:  .  Cholecalciferol (VITAMIN D) 2000 units tablet, Take 2,000 Units by mouth daily., Disp: , Rfl:  .  citalopram (CELEXA) 20 MG tablet, , Disp: , Rfl:  .  erythromycin ophthalmic ointment, ,  Disp: , Rfl:  .  fenofibrate 160 MG tablet, Take 160 mg by mouth daily with supper. , Disp: , Rfl:  .  fluticasone (FLONASE) 50 MCG/ACT nasal spray, Place 2 sprays into both nostrils daily as needed for allergies or rhinitis., Disp: , Rfl:  .  Fluticasone-Salmeterol (ADVAIR) 250-50 MCG/DOSE AEPB, , Disp: , Rfl:  .  hydrocortisone 2.5 % cream, Apply 1 application topically 2 (two) times daily as needed., Disp: , Rfl:  .  levothyroxine (SYNTHROID) 150 MCG tablet, Take 1 tablet (150 mcg total) by mouth daily., Disp: 45 tablet, Rfl: 3 .  Multiple Vitamin (MULTIVITAMIN WITH MINERALS) TABS tablet, Take 1 tablet by mouth daily., Disp: , Rfl:  .  ofloxacin (OCUFLOX) 0.3 % ophthalmic solution, , Disp: , Rfl:  .  omeprazole (PRILOSEC) 20 MG capsule, Take 20 mg by mouth daily before breakfast. , Disp: , Rfl:  .  rosuvastatin (CRESTOR) 10 MG tablet, Take 10 mg by mouth daily. , Disp: , Rfl:  .  Tiotropium Bromide Monohydrate (SPIRIVA RESPIMAT) 2.5 MCG/ACT AERS, Inhale 2 puffs into the lungs daily., Disp: 4 g, Rfl: 11  Allergies: No Known Allergies  Past Medical History, Surgical history, Social history, and Family History  were reviewed and updated.  Review of Systems: Review of Systems  Constitutional: Negative.   HENT:  Negative.   Eyes: Negative.   Respiratory: Negative.   Cardiovascular: Negative.   Gastrointestinal: Negative.  Negative for abdominal distention.  Endocrine: Negative.   Genitourinary: Negative.    Musculoskeletal: Positive for gait problem.  Skin: Negative.   Neurological: Positive for gait problem.  Hematological: Negative.   Psychiatric/Behavioral: Negative.     Physical Exam:  weight is 272 lb (123.4 kg). His oral temperature is 99 F (37.2 C). His blood pressure is 132/59 (abnormal) and his pulse is 58 (abnormal). His respiration is 16 and oxygen saturation is 100%.   Wt Readings from Last 3 Encounters:  02/21/21 272 lb (123.4 kg)  12/25/20 264 lb 3.2 oz (119.8 kg)   11/14/20 271 lb (122.9 kg)    Physical Exam Vitals reviewed.  HENT:     Head: Normocephalic and atraumatic.  Eyes:     Pupils: Pupils are equal, round, and reactive to light.  Cardiovascular:     Rate and Rhythm: Normal rate and regular rhythm.     Heart sounds: Normal heart sounds.  Pulmonary:     Effort: Pulmonary effort is normal.     Breath sounds: Normal breath sounds.  Abdominal:     General: Bowel sounds are normal.     Palpations: Abdomen is soft.  Musculoskeletal:        General: No tenderness or deformity. Normal range of motion.     Cervical back: Normal range of motion.  Lymphadenopathy:     Cervical: No cervical adenopathy.  Skin:    General: Skin is warm and dry.     Findings: No erythema or rash.  Neurological:     Mental Status: He is alert and oriented to person, place, and time.  Psychiatric:        Behavior: Behavior normal.        Thought Content: Thought content normal.        Judgment: Judgment normal.    Lab Results  Component Value Date   WBC 7.4 02/21/2021   HGB 15.0 02/21/2021   HCT 47.2 02/21/2021   MCV 95.2 02/21/2021   PLT 241 02/21/2021     Chemistry      Component Value Date/Time   NA 140 02/21/2021 1128   K 4.4 02/21/2021 1128   CL 104 02/21/2021 1128   CO2 29 02/21/2021 1128   BUN 19 02/21/2021 1128   CREATININE 1.15 02/21/2021 1128      Component Value Date/Time   CALCIUM 9.3 02/21/2021 1128   ALKPHOS 32 (L) 02/21/2021 1128   AST 16 02/21/2021 1128   ALT 12 02/21/2021 1128   BILITOT 0.5 02/21/2021 1128      Impression and Plan: Timothy Conway is a very nice 65 year old white male.  He had a nonsmall cell lung cancer of the left upper lung.  This was an adenocarcinoma.  It was a low-grade adenocarcinoma based on the low level of activity on the PET scan.   I think at this point, we probably do not have to do another scan on him except for 4 months.  I think this would be reasonable.  I would feel very confident that the  radiosurgery has got rid of the lung cancer.  I told him that if he has any problems with his mouth, that we have a dentist that we could certainly refer him to for an evaluation.   [name],  MD  3/25/20223:07 PM

## 2021-02-21 NOTE — Progress Notes (Signed)
Oncology Nurse Navigator Documentation  Oncology Nurse Navigator Flowsheets 02/21/2021  Abnormal Finding Date -  Confirmed Diagnosis Date -  Diagnosis Status -  Planned Course of Treatment -  Phase of Treatment -  Radiation Actual Start Date: -  Radiation Actual End Date: -  Navigator Follow Up Date: -  Navigator Follow Up Reason: -  Navigation Complete Date: 02/21/2021  Post Navigation: Continue to Follow Patient? No  Reason Not Navigating Patient: No Treatment, Observation Only  Navigator Location [facility]  Referral Date to RadOnc/MedOnc -  Navigator Encounter Type Appt/Treatment Plan Review  Telephone -  Treatment Initiated Date -  Patient Visit Type MedOnc  Treatment Phase Post-Tx Follow-up  Barriers/Navigation Needs Coordination of Care  Education -  Interventions None Required  Acuity Level 1-No Barriers  Referrals -  Coordination of Care -  Education Method -  Support Groups/Services Friends and Family  Time Spent with Patient 15

## 2021-03-03 ENCOUNTER — Other Ambulatory Visit: Payer: Self-pay | Admitting: Emergency Medicine

## 2021-03-10 ENCOUNTER — Other Ambulatory Visit: Payer: Self-pay | Admitting: Internal Medicine

## 2021-04-03 DIAGNOSIS — I10 Essential (primary) hypertension: Secondary | ICD-10-CM | POA: Diagnosis not present

## 2021-04-03 DIAGNOSIS — M545 Low back pain, unspecified: Secondary | ICD-10-CM | POA: Diagnosis not present

## 2021-04-03 DIAGNOSIS — R300 Dysuria: Secondary | ICD-10-CM | POA: Diagnosis not present

## 2021-04-03 DIAGNOSIS — S39012A Strain of muscle, fascia and tendon of lower back, initial encounter: Secondary | ICD-10-CM | POA: Diagnosis not present

## 2021-04-14 DIAGNOSIS — S39012D Strain of muscle, fascia and tendon of lower back, subsequent encounter: Secondary | ICD-10-CM | POA: Diagnosis not present

## 2021-04-23 DIAGNOSIS — I10 Essential (primary) hypertension: Secondary | ICD-10-CM | POA: Diagnosis not present

## 2021-04-23 DIAGNOSIS — Z0001 Encounter for general adult medical examination with abnormal findings: Secondary | ICD-10-CM | POA: Diagnosis not present

## 2021-04-23 DIAGNOSIS — E7800 Pure hypercholesterolemia, unspecified: Secondary | ICD-10-CM | POA: Diagnosis not present

## 2021-04-23 DIAGNOSIS — E039 Hypothyroidism, unspecified: Secondary | ICD-10-CM | POA: Diagnosis not present

## 2021-04-23 DIAGNOSIS — I719 Aortic aneurysm of unspecified site, without rupture: Secondary | ICD-10-CM | POA: Diagnosis not present

## 2021-04-23 DIAGNOSIS — Z23 Encounter for immunization: Secondary | ICD-10-CM | POA: Diagnosis not present

## 2021-04-23 DIAGNOSIS — R300 Dysuria: Secondary | ICD-10-CM | POA: Diagnosis not present

## 2021-04-23 DIAGNOSIS — C3492 Malignant neoplasm of unspecified part of left bronchus or lung: Secondary | ICD-10-CM | POA: Diagnosis not present

## 2021-04-23 DIAGNOSIS — Z79899 Other long term (current) drug therapy: Secondary | ICD-10-CM | POA: Diagnosis not present

## 2021-05-08 DIAGNOSIS — M4316 Spondylolisthesis, lumbar region: Secondary | ICD-10-CM | POA: Diagnosis not present

## 2021-05-08 DIAGNOSIS — M5125 Other intervertebral disc displacement, thoracolumbar region: Secondary | ICD-10-CM | POA: Diagnosis not present

## 2021-05-08 DIAGNOSIS — M48061 Spinal stenosis, lumbar region without neurogenic claudication: Secondary | ICD-10-CM | POA: Diagnosis not present

## 2021-05-08 DIAGNOSIS — M47816 Spondylosis without myelopathy or radiculopathy, lumbar region: Secondary | ICD-10-CM | POA: Diagnosis not present

## 2021-05-08 DIAGNOSIS — M439 Deforming dorsopathy, unspecified: Secondary | ICD-10-CM | POA: Diagnosis not present

## 2021-05-23 ENCOUNTER — Other Ambulatory Visit: Payer: Self-pay | Admitting: Family Medicine

## 2021-05-27 ENCOUNTER — Other Ambulatory Visit: Payer: Self-pay | Admitting: Family Medicine

## 2021-05-27 DIAGNOSIS — C3492 Malignant neoplasm of unspecified part of left bronchus or lung: Secondary | ICD-10-CM

## 2021-05-27 DIAGNOSIS — M899 Disorder of bone, unspecified: Secondary | ICD-10-CM

## 2021-06-01 ENCOUNTER — Ambulatory Visit
Admission: RE | Admit: 2021-06-01 | Discharge: 2021-06-01 | Disposition: A | Discharge status: Home / Self Care | Payer: Medicare HMO | Source: Ambulatory Visit | Attending: Family Medicine | Admitting: Family Medicine

## 2021-06-01 ENCOUNTER — Other Ambulatory Visit: Payer: Self-pay

## 2021-06-01 DIAGNOSIS — M899 Disorder of bone, unspecified: Secondary | ICD-10-CM

## 2021-06-01 DIAGNOSIS — M48061 Spinal stenosis, lumbar region without neurogenic claudication: Secondary | ICD-10-CM | POA: Diagnosis not present

## 2021-06-01 DIAGNOSIS — M545 Low back pain, unspecified: Secondary | ICD-10-CM | POA: Diagnosis not present

## 2021-06-01 DIAGNOSIS — C3492 Malignant neoplasm of unspecified part of left bronchus or lung: Secondary | ICD-10-CM

## 2021-06-01 IMAGING — MR MR LUMBAR SPINE W/O CM
4 of 8 series · 15 of 48 positions shown · non-contrast
Comparison: None.

CLINICAL DATA: Bilateral leg pain, lung cancer

EXAM:
MRI LUMBAR SPINE WITHOUT CONTRAST
TECHNIQUE: Multiplanar, multisequence MR imaging of the lumbar spine was
performed. No intravenous contrast was administered.

[Series 5: T2 · sagittal · 4.0mm · 0.73mm/px · 4 of 15 slices shown (1 of 4)]
[im 1/15]
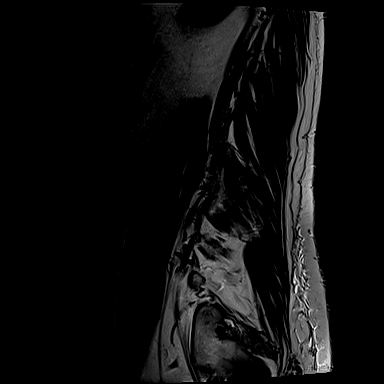
[im 5/15]
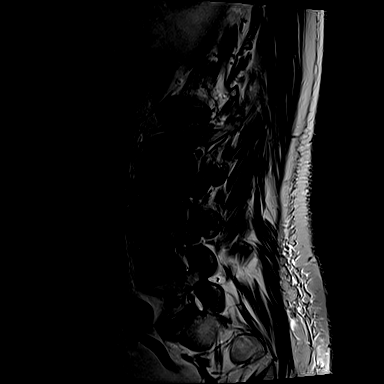
[im 10/15]
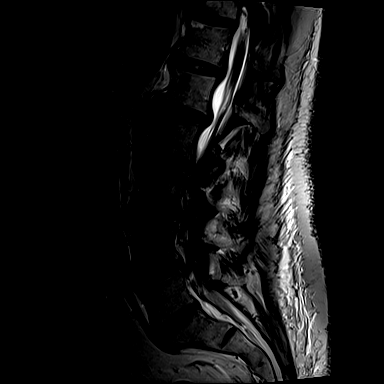
[im 15/15]
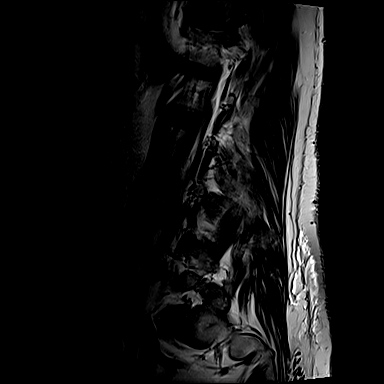

[Series 10: T2 · axial · 4.0mm · 0.28mm/px · z∈[+97,+186]mm · 5 of 19 slices shown (2 of 4)]
[im 1/19]
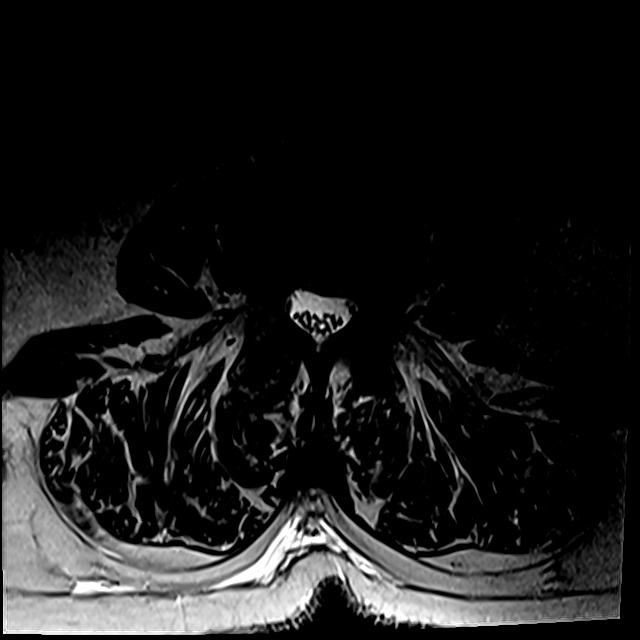
[im 5/19]
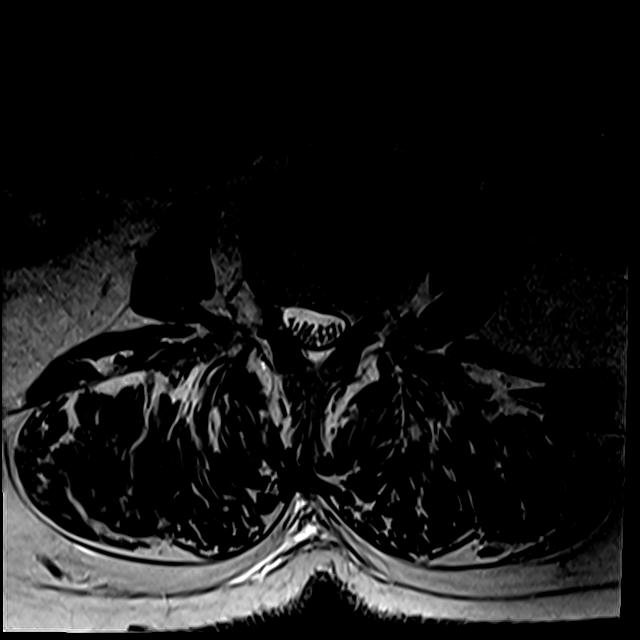
[im 10/19]
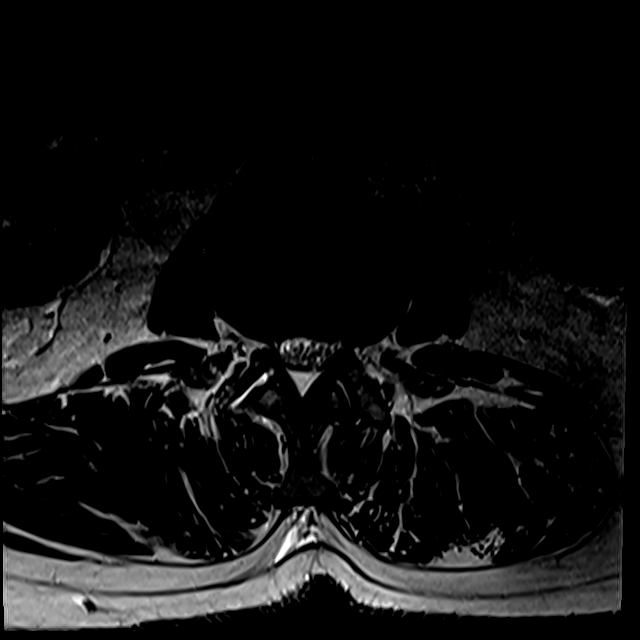
[im 14/19]
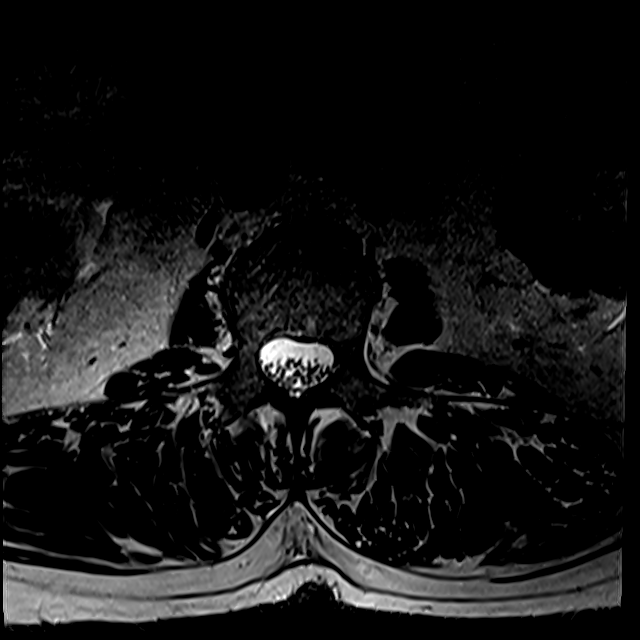
[im 19/19]
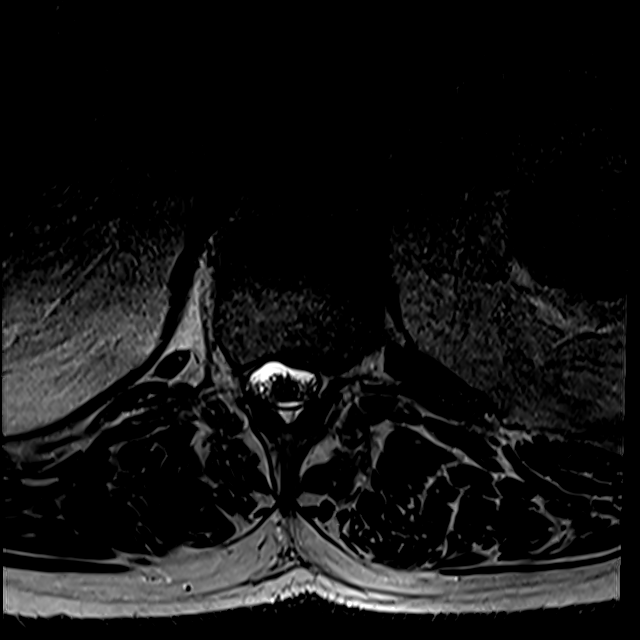

[Series 11: T2 · axial · 4.0mm · 0.28mm/px · z∈[-25,+54]mm · 3 of 24 slices shown (3 of 4)]
[im 4/24]
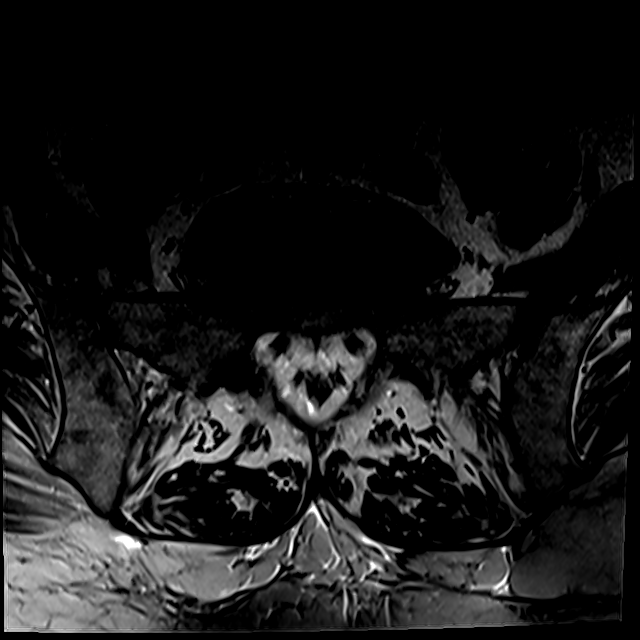
[im 12/24]
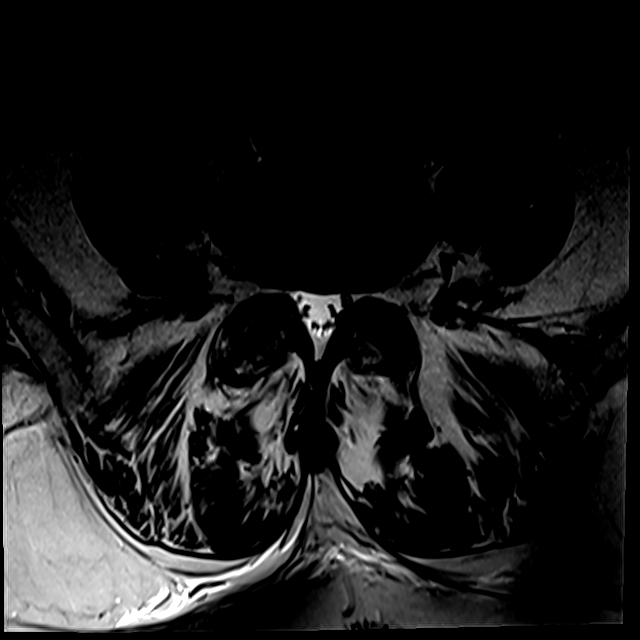
[im 20/24]
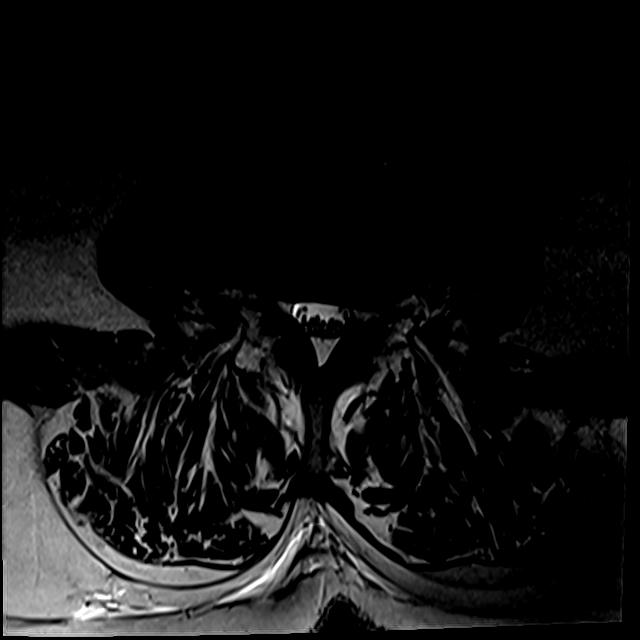

[Series 12: T2 · axial · 4.0mm · 0.28mm/px · z∈[-5,+166]mm · 3 of 43 slices shown (4 of 4)]
[im 8/43]
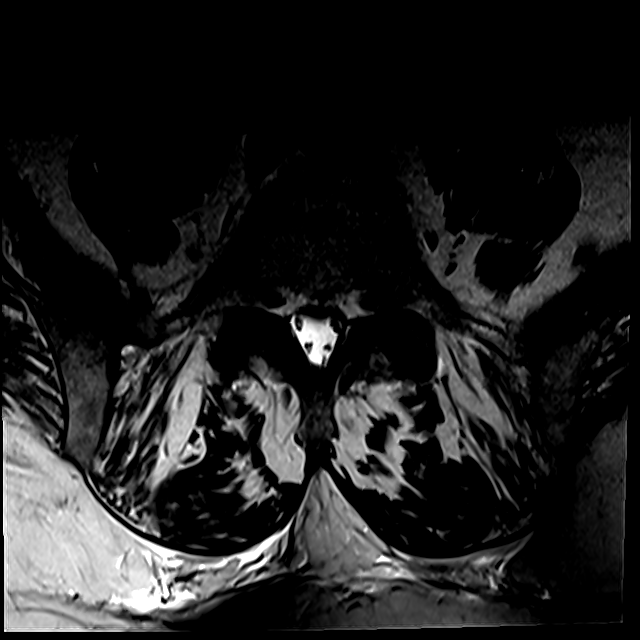
[im 23/43]
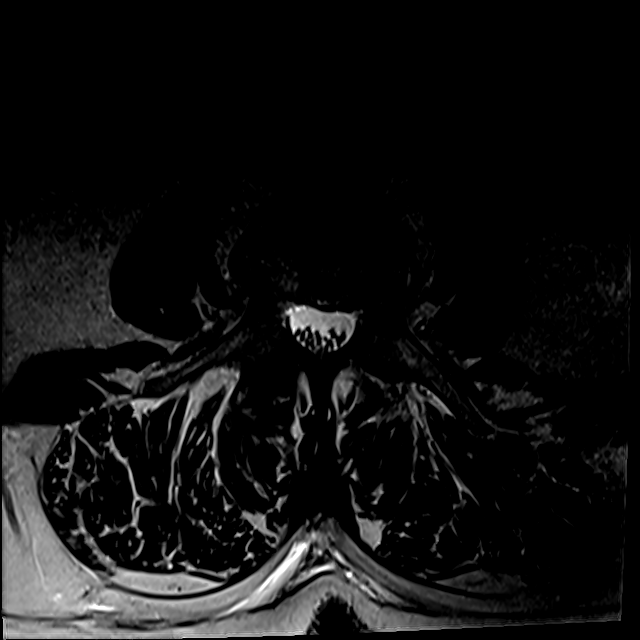
[im 39/43]
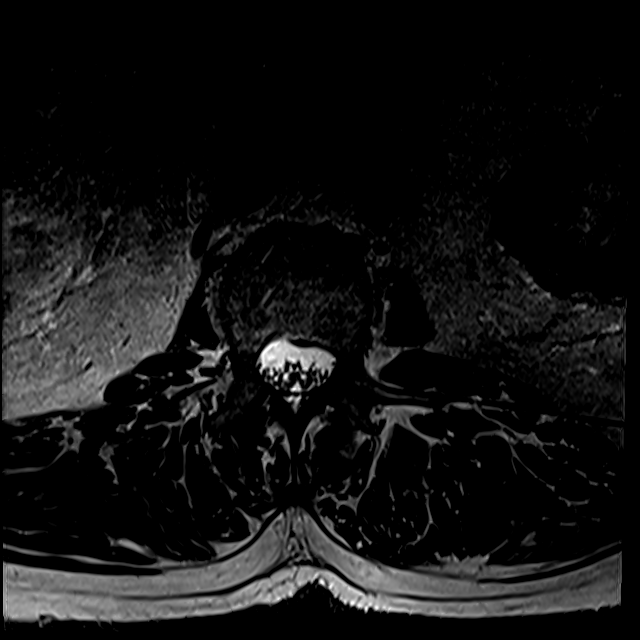

[15 of 48 positions shown; findings below may reference images not displayed]

FINDINGS: Segmentation:  Standard.

Alignment:  Mild retrolisthesis at L1-L2 and L2-L3.

Vertebrae: Lumbar vertebral body heights are maintained and marrow
signal is within normal limits. There is abnormal STIR
hyperintensity at the superior endplate of T12 and within the
visualized T11 vertebral body. There is loss of height at the
inferior endplate of T11.

Conus medullaris and cauda equina: Conus extends to the L1 level.
Conus and cauda equina appear normal.

Paraspinal and other soft tissues: Unremarkable.

Disc levels:

T12-L1: Small left central disc protrusion. No canal or foraminal
stenosis.

L1-L2: Disc bulge. Mild facet arthropathy. Mild to moderate canal
stenosis. No foraminal stenosis.

L2-L3: Disc bulge. Mild facet arthropathy. Mild to moderate canal
stenosis. No foraminal stenosis.

L3-L4: Disc bulge. Mild facet arthropathy. Mild canal stenosis. No
foraminal stenosis.

L4-L5: Disc bulge. Moderate facet arthropathy. No canal stenosis.
Minor right foraminal stenosis. No left foraminal stenosis.

L5-S1: Disc bulge. Mild facet arthropathy. No canal or foraminal
stenosis.
IMPRESSION: There is abnormal signal at T11 and T12, partially imaged.
Differential considerations include metastatic disease, early
changes of discitis/osteomyelitis, and degenerative endplate edema.
Mild loss of height at the inferior endplate of T11 is new from
[REDACTED] chest CT and may be benign or pathologic.

Multilevel degenerative changes as detailed above without high-grade
stenosis.

These results will be called to the ordering clinician or
representative by the Radiologist Assistant, and communication
documented in the PACS or [REDACTED].

## 2021-06-06 ENCOUNTER — Other Ambulatory Visit: Payer: Self-pay | Admitting: Internal Medicine

## 2021-06-10 ENCOUNTER — Other Ambulatory Visit: Payer: Self-pay | Admitting: Family Medicine

## 2021-06-10 DIAGNOSIS — C3492 Malignant neoplasm of unspecified part of left bronchus or lung: Secondary | ICD-10-CM

## 2021-06-10 DIAGNOSIS — M899 Disorder of bone, unspecified: Secondary | ICD-10-CM

## 2021-06-12 DIAGNOSIS — C3490 Malignant neoplasm of unspecified part of unspecified bronchus or lung: Secondary | ICD-10-CM | POA: Diagnosis not present

## 2021-06-12 DIAGNOSIS — M546 Pain in thoracic spine: Secondary | ICD-10-CM | POA: Diagnosis not present

## 2021-06-18 ENCOUNTER — Ambulatory Visit: Payer: Medicare HMO | Admitting: Internal Medicine

## 2021-06-19 ENCOUNTER — Inpatient Hospital Stay: Payer: Medicare HMO

## 2021-06-19 ENCOUNTER — Ambulatory Visit (HOSPITAL_BASED_OUTPATIENT_CLINIC_OR_DEPARTMENT_OTHER): Admission: RE | Admit: 2021-06-19 | Payer: Medicare HMO | Source: Ambulatory Visit

## 2021-06-19 ENCOUNTER — Inpatient Hospital Stay: Payer: Medicare HMO | Admitting: Hematology & Oncology

## 2021-06-24 ENCOUNTER — Telehealth: Payer: Self-pay

## 2021-06-24 ENCOUNTER — Other Ambulatory Visit: Payer: Self-pay

## 2021-06-24 ENCOUNTER — Ambulatory Visit
Admission: RE | Admit: 2021-06-24 | Discharge: 2021-06-24 | Disposition: A | Discharge status: Home / Self Care | Payer: Medicare HMO | Source: Ambulatory Visit | Attending: Family Medicine | Admitting: Family Medicine

## 2021-06-24 ENCOUNTER — Ambulatory Visit
Admission: RE | Admit: 2021-06-24 | Discharge: 2021-06-24 | Disposition: A | Discharge status: Home / Self Care | Payer: Medicare HMO | Source: Ambulatory Visit | Attending: Hematology & Oncology | Admitting: Hematology & Oncology

## 2021-06-24 DIAGNOSIS — S22089A Unspecified fracture of T11-T12 vertebra, initial encounter for closed fracture: Secondary | ICD-10-CM | POA: Diagnosis not present

## 2021-06-24 DIAGNOSIS — M5124 Other intervertebral disc displacement, thoracic region: Secondary | ICD-10-CM | POA: Diagnosis not present

## 2021-06-24 DIAGNOSIS — C3492 Malignant neoplasm of unspecified part of left bronchus or lung: Secondary | ICD-10-CM

## 2021-06-24 DIAGNOSIS — I700 Atherosclerosis of aorta: Secondary | ICD-10-CM | POA: Diagnosis not present

## 2021-06-24 DIAGNOSIS — C3490 Malignant neoplasm of unspecified part of unspecified bronchus or lung: Secondary | ICD-10-CM | POA: Diagnosis not present

## 2021-06-24 DIAGNOSIS — M2578 Osteophyte, vertebrae: Secondary | ICD-10-CM | POA: Diagnosis not present

## 2021-06-24 DIAGNOSIS — M899 Disorder of bone, unspecified: Secondary | ICD-10-CM

## 2021-06-24 DIAGNOSIS — Z85118 Personal history of other malignant neoplasm of bronchus and lung: Secondary | ICD-10-CM | POA: Diagnosis not present

## 2021-06-24 IMAGING — MR MR THORACIC SPINE WO/W CM
5 of 9 series · 25 of 48 positions shown · IV contrast (20ml Multihance)
Comparison: Chest CT from the same day and [DATE]

CLINICAL DATA: Back pain radiating into the bilateral side for 3
months. History of lung cancer

EXAM:
MRI THORACIC WITHOUT AND WITH CONTRAST
TECHNIQUE: Multiplanar and multiecho pulse sequences of the thoracic spine were
obtained without and with intravenous contrast.
CONTRAST:  20mL MULTIHANCE GADOBENATE DIMEGLUMINE 529 MG/ML IV SOLN

[Series 3: T2 · sagittal · 5.0mm · 1.56mm/px · 4 of 29 slices shown (1 of 2)]
[im 1/29]
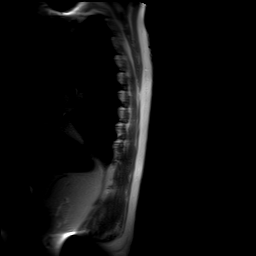
[im 10/29]
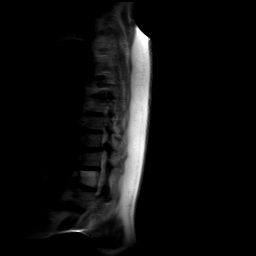
[im 19/29]
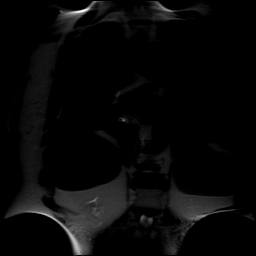
[im 29/29]
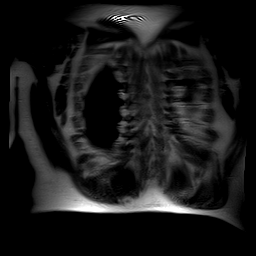

[Series 5: T1 · sagittal · 3.0mm · 0.56mm/px · 4 of 21 slices shown (1 of 2)]
[im 1/21]
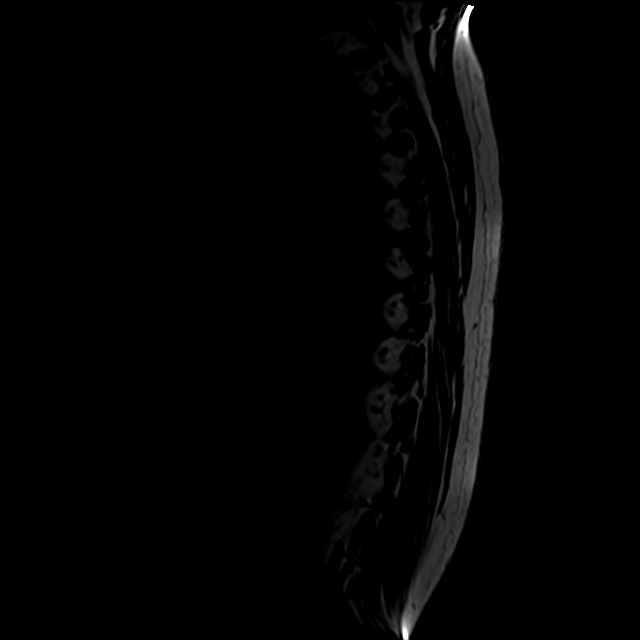
[im 7/21]
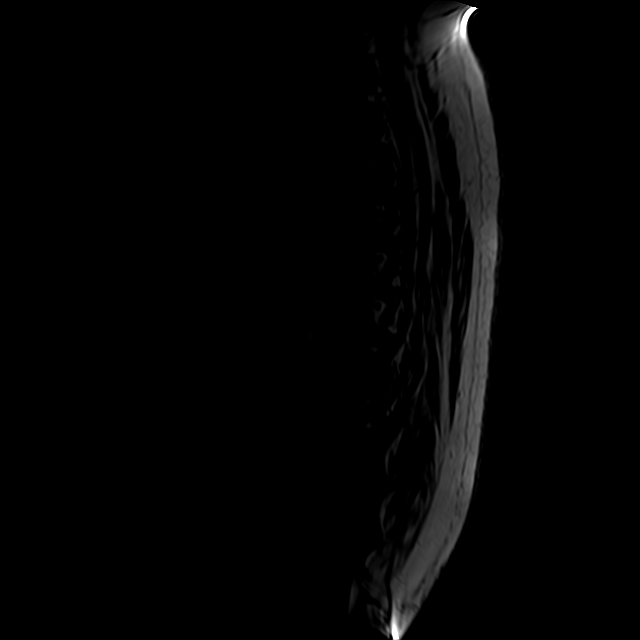
[im 14/21]
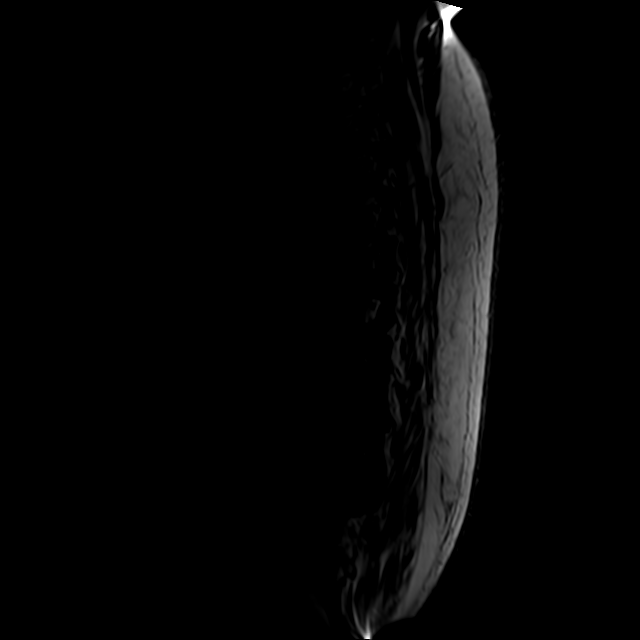
[im 21/21]
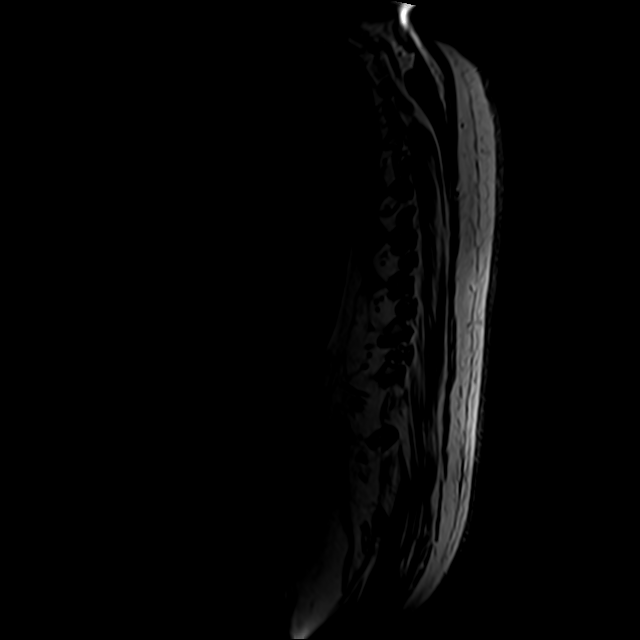

[Series 6: T2 · axial · 4.0mm · 0.39mm/px · z∈[-358,-124]mm · 7 of 36 slices shown (2 of 2)]
[im 1/36]
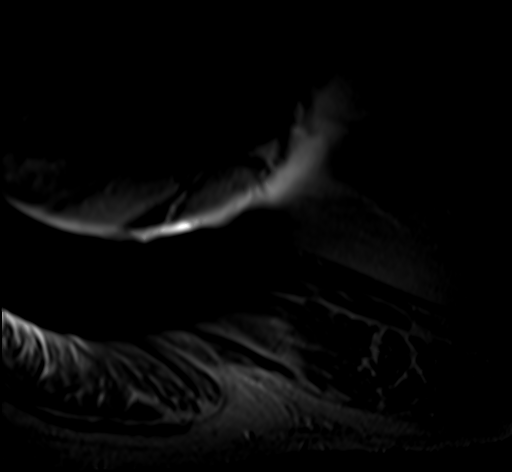
[im 6/36]
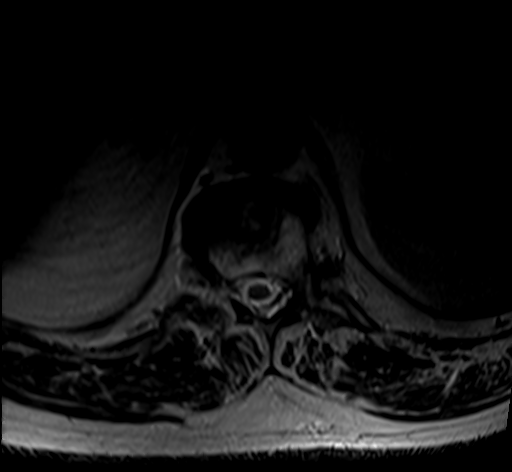
[im 12/36]
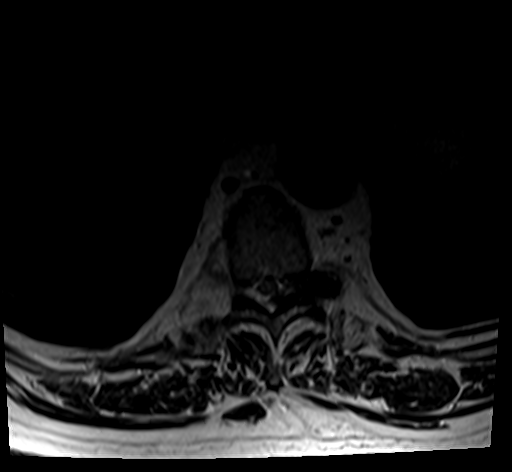
[im 18/36]
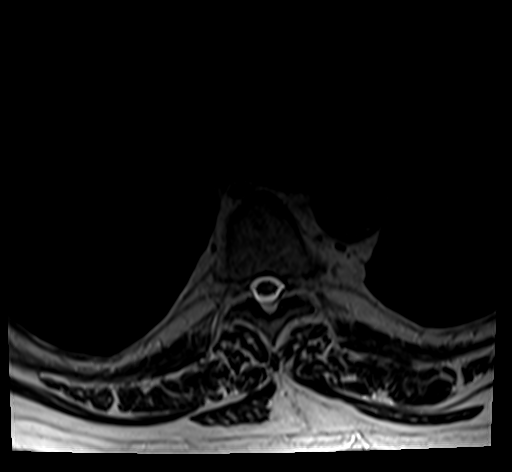
[im 24/36]
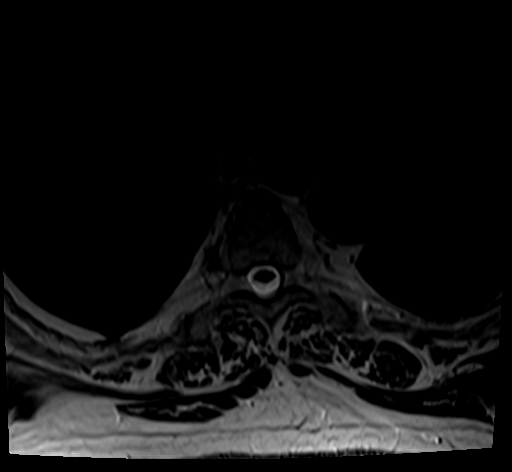
[im 30/36]
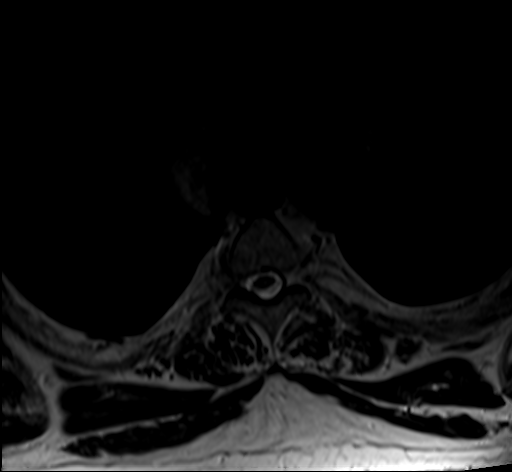
[im 36/36]
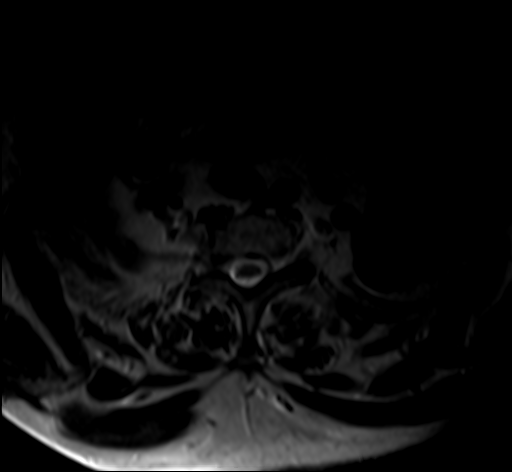

[Series 8: T1 · axial · 4.0mm · 0.78mm/px · z∈[-359,-156]mm · 6 of 36 slices shown (2 of 2)]
[im 1/36]
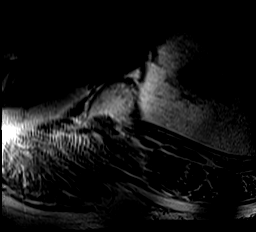
[im 6/36]
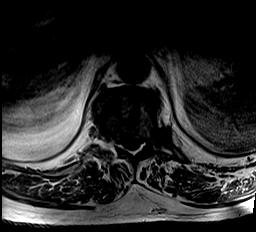
[im 12/36]
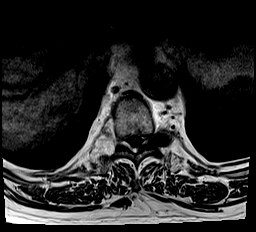
[im 18/36]
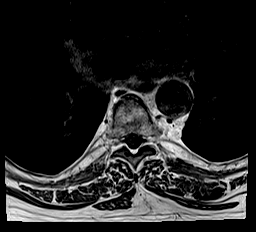
[im 24/36]
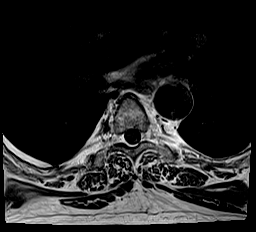
[im 30/36]
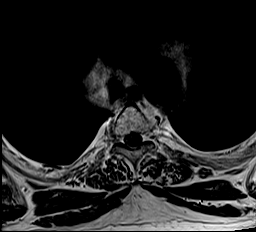

[Series 9: T2 post-contrast · sagittal · 3.0mm · 0.70mm/px · 4 of 21 slices shown]
[im 1/21]
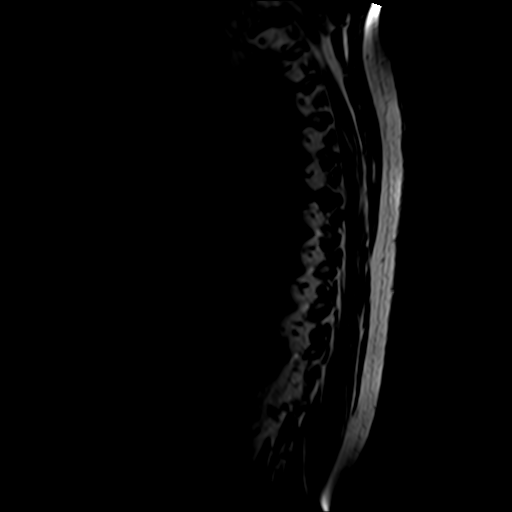
[im 7/21]
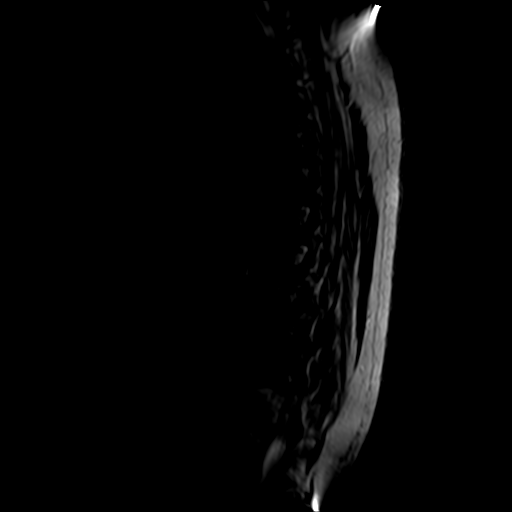
[im 14/21]
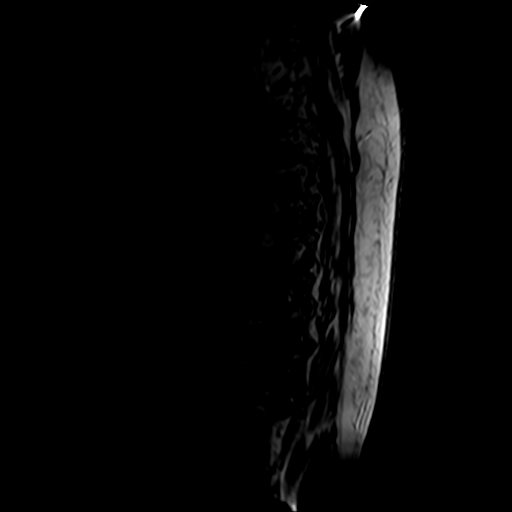
[im 21/21]
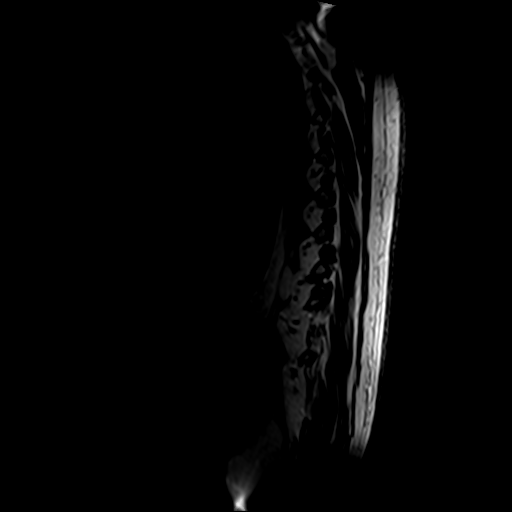

[25 of 48 positions shown; findings below may reference images not displayed]

FINDINGS: Alignment:  Physiologic

Vertebrae: T11 inferior endplate fracture with depression. Signal
abnormality is diffuse within the vertebral body and also seen in
the left eleventh rib head. There is diffuse enhancement, although
postcontrast [HOSPITAL] this level and below is limited by failure
of fat saturation. No bone lesion seen elsewhere. No indication of
the chart of documented prior metaphysis. Moderate marrow edema at
the left T9-10 facet.

Cord:  Degenerative deformity at T9-10.

Paraspinal and other soft tissues: No paraspinal mass or
inflammation noted.

Disc levels:

Generalized disc narrowing and desiccation with small central
protrusions at T2-3 and T3-4 primarily. Disc narrowing and bulging
at T9-10 where there is also asymmetric left-sided facet spurring
that also affects the T8-9 and T10-11 levels. Left foraminal
stenosis at T8-9 to T10-11, severe appearing at T9-10 where there is
marrow edema. T9-10 spinal stenosis with effacement of CSF and mild
cord
IMPRESSION: 1. Subacute appearing T11 inferior endplate fracture with
depression. The extent of signal abnormality is prominent for the
size of fracture and close follow-up versus is biopsy at time of
percutaneous treatment is recommended.
2. Prominent left-sided facet osteoarthritis at T8-9 to T10-11 with
severe left foraminal impingement at T9-10 where there active facet
arthritis and spinal stenosis causing cord deformity.

## 2021-06-24 IMAGING — CT CT CHEST W/O CM
1 of 2 series · 14 of 30 positions shown, 18 images · non-contrast
Comparison: [DATE], [DATE]

CLINICAL DATA: Non-small cell lung cancer, radiosurgery for left
upper lobe pulmonary nodule

EXAM:
CT CHEST WITHOUT CONTRAST
TECHNIQUE: Multidetector CT imaging of the chest was performed following the
standard protocol without IV contrast.

[Series 2: chest w/(date) · axial · 0.89mm/px · z∈[-354,-52]mm · 14 of 177 slices shown, 18 images]
[im 13/177  mediastinal]
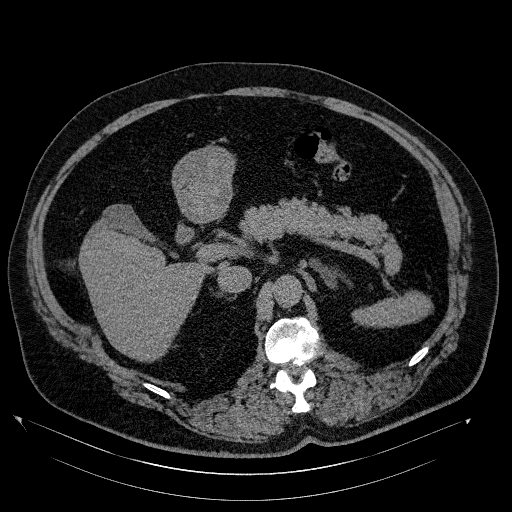
[im 13/177  lung]
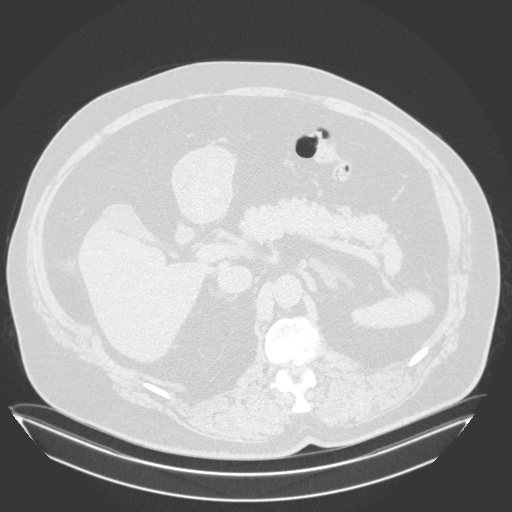
[im 26/177  lung]
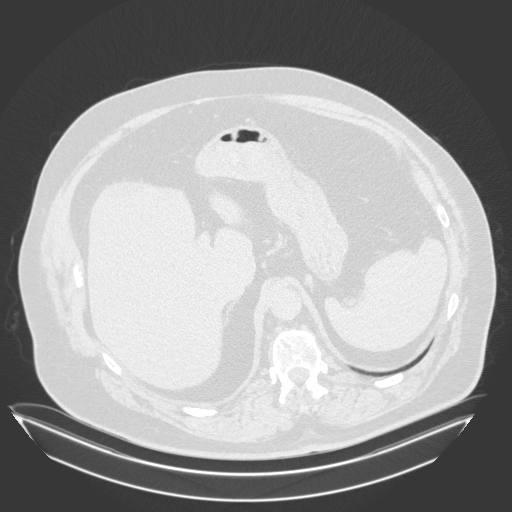
[im 38/177  lung]
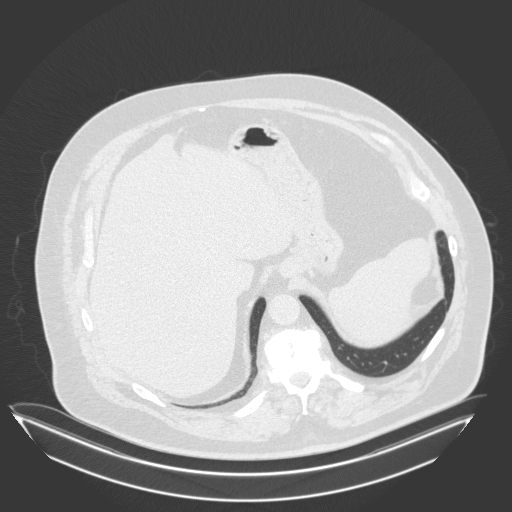
[im 51/177  lung]
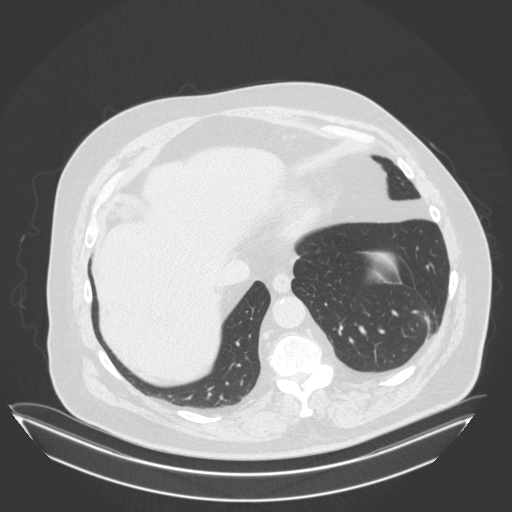
[im 63/177  mediastinal]
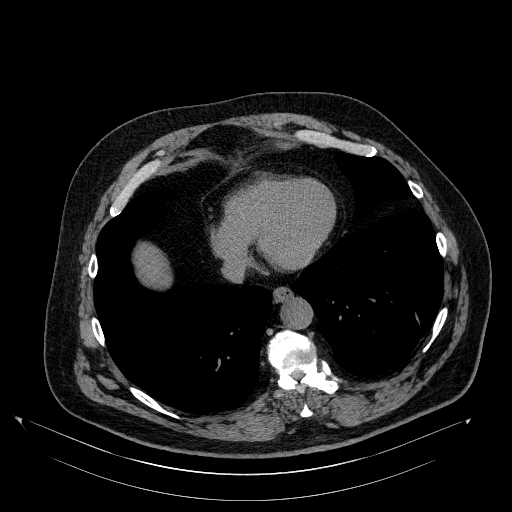
[im 63/177  lung]
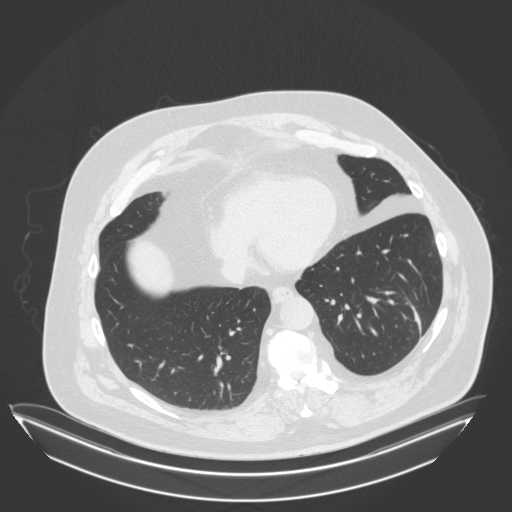
[im 76/177  lung]
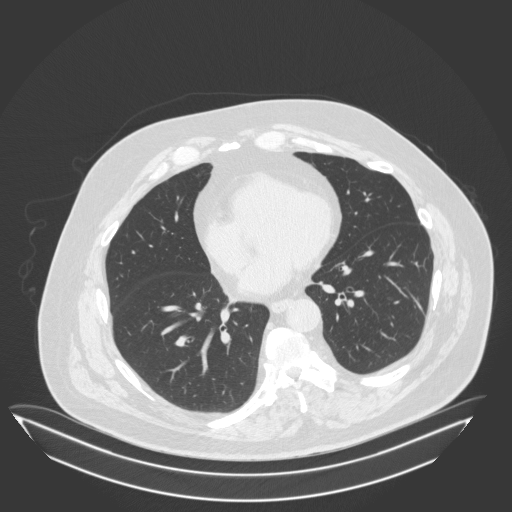
[im 84/177  lung]
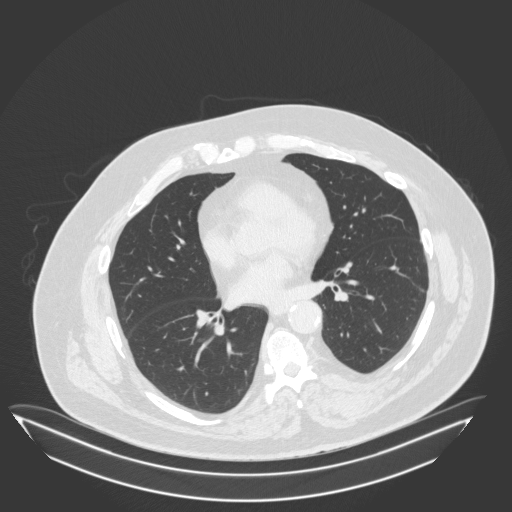
[im 89/177  lung]
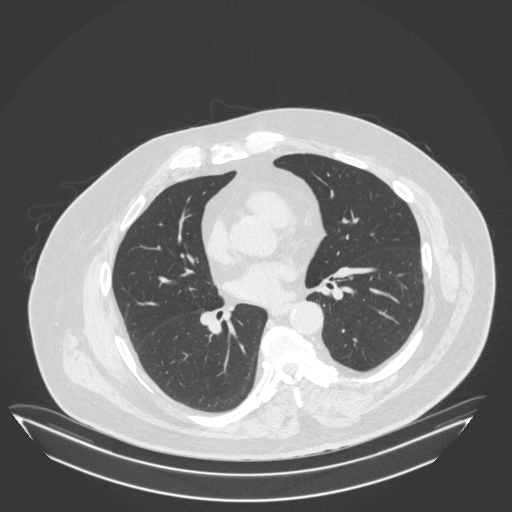
[im 101/177  mediastinal]
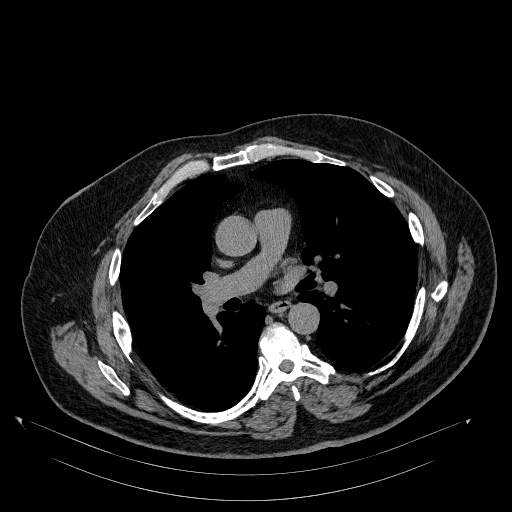
[im 101/177  lung]
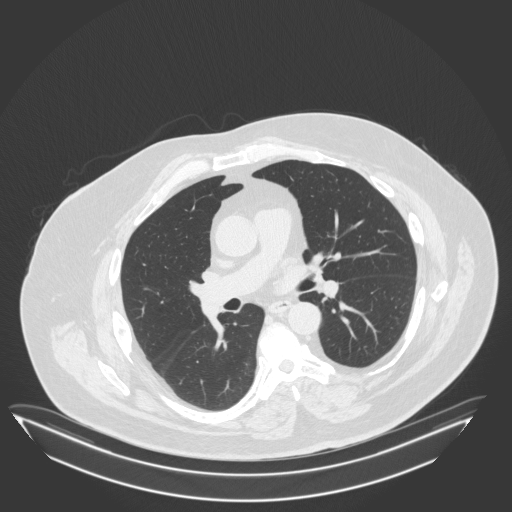
[im 114/177  lung]
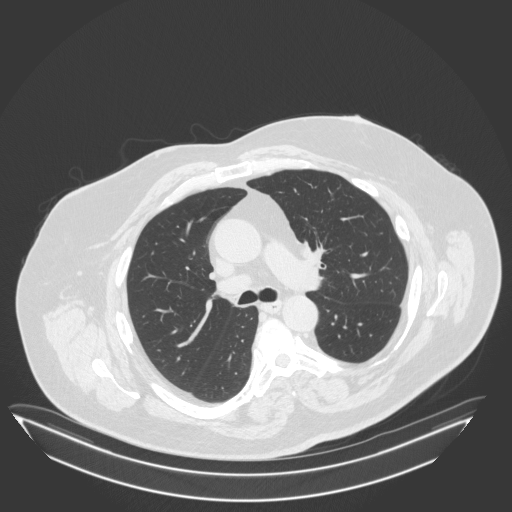
[im 126/177  lung]
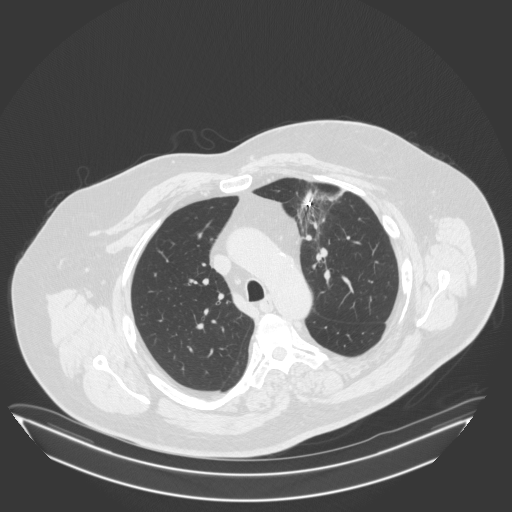
[im 139/177  lung]
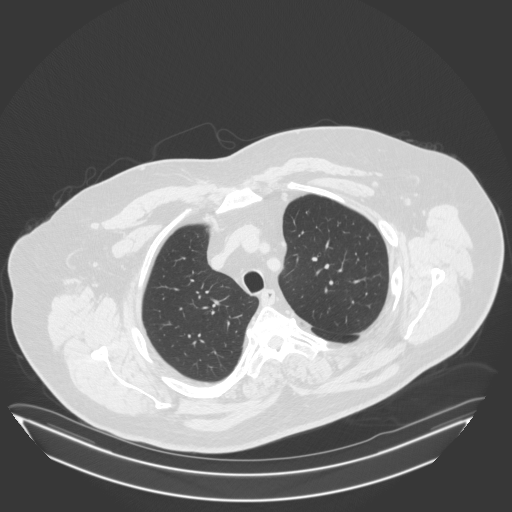
[im 151/177  mediastinal]
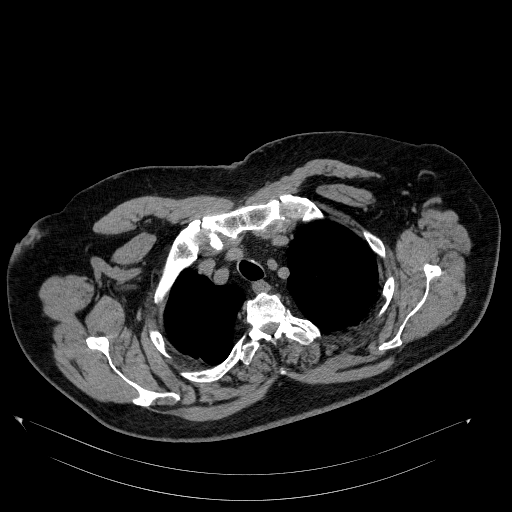
[im 151/177  lung]
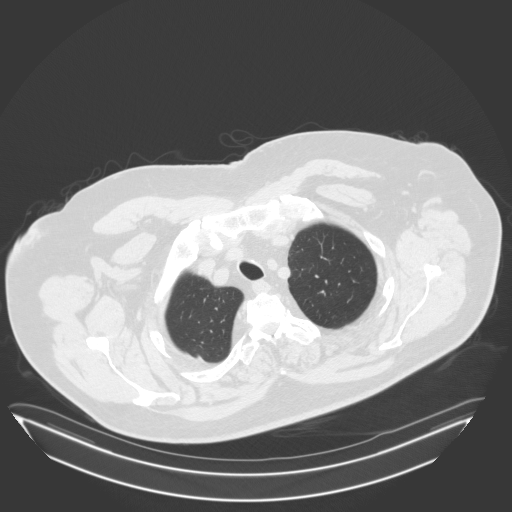
[im 164/177  lung]
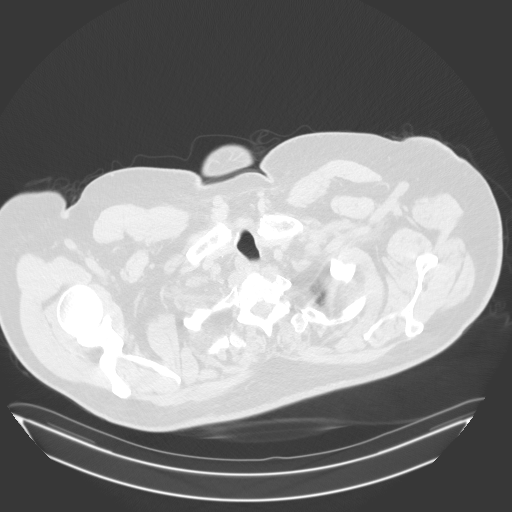

[14 of 30 positions shown; findings below may reference images not displayed]

FINDINGS: Cardiovascular: Aortic atherosclerosis. Normal heart size. No
pericardial effusion.

Mediastinum/Nodes: No enlarged mediastinal, hilar, or axillary lymph
nodes. Thyroid gland, trachea, and esophagus demonstrate no
significant findings.

Lungs/Pleura: Interval increase in consolidation and architectural
distortion about fiducial markers of the anterior left upper lobe
(series 5, image 51). Unchanged bandlike scarring of the left lung
base. No pleural effusion or pneumothorax.

Upper Abdomen: No acute abnormality.

Musculoskeletal: No chest wall mass or suspicious bone lesions
identified.
IMPRESSION: 1. Interval increase in consolidation and architectural distortion
about fiducial markers of the anterior left upper lobe, consistent
with expected evolution of radiation fibrosis. Attention on
follow-up.
2. No evidence of recurrent or metastatic disease in the chest.

Aortic Atherosclerosis ([VE]-[VE]).

## 2021-06-24 MED ORDER — GADOBENATE DIMEGLUMINE 529 MG/ML IV SOLN
20.0000 mL | Freq: Once | INTRAVENOUS | Status: AC | PRN
  Administered 2021-06-24: 20 mL via INTRAVENOUS

## 2021-06-24 NOTE — Telephone Encounter (Signed)
-----   Message from Timothy Napoleon, MD sent at 06/24/2021 11:57 AM EDT ----- Call and tell him that the CT scan does not show any obvious cancer.  It shows that where he had the radiation there is more scar tissue.  [name]

## 2021-06-24 NOTE — Telephone Encounter (Signed)
Called and informed patient of lab results, patient verbalized understanding and denies any questions or concerns at this time.   

## 2021-06-25 ENCOUNTER — Inpatient Hospital Stay: Payer: Medicare HMO | Admitting: Hematology & Oncology

## 2021-06-25 ENCOUNTER — Ambulatory Visit (HOSPITAL_BASED_OUTPATIENT_CLINIC_OR_DEPARTMENT_OTHER): Payer: Medicare HMO

## 2021-06-25 ENCOUNTER — Inpatient Hospital Stay: Payer: Medicare HMO | Attending: Hematology & Oncology

## 2021-06-25 ENCOUNTER — Encounter: Payer: Self-pay | Admitting: Hematology & Oncology

## 2021-06-25 VITALS — BP 136/83 | HR 69 | Temp 98.2°F | Resp 18 | Wt 269.0 lb

## 2021-06-25 DIAGNOSIS — Z7951 Long term (current) use of inhaled steroids: Secondary | ICD-10-CM | POA: Insufficient documentation

## 2021-06-25 DIAGNOSIS — Z923 Personal history of irradiation: Secondary | ICD-10-CM | POA: Insufficient documentation

## 2021-06-25 DIAGNOSIS — Z7982 Long term (current) use of aspirin: Secondary | ICD-10-CM | POA: Insufficient documentation

## 2021-06-25 DIAGNOSIS — C3492 Malignant neoplasm of unspecified part of left bronchus or lung: Secondary | ICD-10-CM | POA: Diagnosis not present

## 2021-06-25 DIAGNOSIS — Z79899 Other long term (current) drug therapy: Secondary | ICD-10-CM | POA: Diagnosis not present

## 2021-06-25 DIAGNOSIS — C3412 Malignant neoplasm of upper lobe, left bronchus or lung: Secondary | ICD-10-CM | POA: Insufficient documentation

## 2021-06-25 LAB — CMP (CANCER CENTER ONLY)
ALT: 16 U/L (ref 0–44)
AST: 24 U/L (ref 15–41)
Albumin: 4.5 g/dL (ref 3.5–5.0)
Alkaline Phosphatase: 40 U/L (ref 38–126)
Anion gap: 8 (ref 5–15)
BUN: 25 mg/dL — ABNORMAL HIGH (ref 8–23)
CO2: 29 mmol/L (ref 22–32)
Calcium: 9 mg/dL (ref 8.9–10.3)
Chloride: 104 mmol/L (ref 98–111)
Creatinine: 1.09 mg/dL (ref 0.61–1.24)
GFR, Estimated: >60 mL/min (ref >60)
Glucose, Bld: 89 mg/dL (ref 70–99)
Potassium: 4.4 mmol/L (ref 3.5–5.1)
Sodium: 141 mmol/L (ref 135–145)
Total Bilirubin: 0.6 mg/dL (ref 0.3–1.2)
Total Protein: 7.3 g/dL (ref 6.5–8.1)

## 2021-06-25 LAB — CBC WITH DIFFERENTIAL (CANCER CENTER ONLY)
Abs Immature Granulocytes: 0.02 10*3/uL (ref 0.00–0.07)
Basophils Absolute: 0.1 10*3/uL (ref 0.0–0.1)
Basophils Relative: 1 %
Eosinophils Absolute: 0.2 10*3/uL (ref 0.0–0.5)
Eosinophils Relative: 2 %
HCT: 51.8 % (ref 39.0–52.0)
Hemoglobin: 16.5 g/dL (ref 13.0–17.0)
Immature Granulocytes: 0 %
Lymphocytes Relative: 34 %
Lymphs Abs: 2.5 10*3/uL (ref 0.7–4.0)
MCH: 30.3 pg (ref 26.0–34.0)
MCHC: 31.9 g/dL (ref 30.0–36.0)
MCV: 95 fL (ref 80.0–100.0)
Monocytes Absolute: 0.6 10*3/uL (ref 0.1–1.0)
Monocytes Relative: 8 %
Neutro Abs: 4 10*3/uL (ref 1.7–7.7)
Neutrophils Relative %: 55 %
Platelet Count: 240 10*3/uL (ref 150–400)
RBC: 5.45 MIL/uL (ref 4.22–5.81)
RDW: 12.6 % (ref 11.5–15.5)
WBC Count: 7.3 10*3/uL (ref 4.0–10.5)
nRBC: 0 % (ref 0.0–0.2)

## 2021-06-25 NOTE — Progress Notes (Signed)
Pt. stopped taking Celexa beacause "it was making me feel worse". He informed his prescribing physician.

## 2021-06-25 NOTE — Progress Notes (Signed)
[name]  Timothy Conway [id number] 1956/06/07 65 y.o. 06/25/2021   Principle Diagnosis:  Stage Ia (T1N0M0) adenocarcinoma of the left upper lung  Current Therapy:   Status post radiosurgery-5400 rad that was completed in 08/20/2020     Interim History:  Timothy Conway is back for follow-up.  We did do a CT scan of his chest.  This was done a couple days ago.  Thankfully, there is no evidence of recurrent disease.  He had changes in his left upper lung from radiation.  There is nothing that would suggest recurrence of his lung cancer.  He feels okay.  There is no cough or shortness of breath.  He has had no hemoptysis.  He has had no nausea or vomiting.  He is avoided the coronavirus.  His appetite is good.  He is had no leg swelling.  There has been no rashes.  Overall, I would say performance status is probably ECOG 1.    Medications:  Current Outpatient Medications:    albuterol (VENTOLIN HFA) 108 (90 Base) MCG/ACT inhaler, INHALE 2 PUFFS INTO THE LUNGS EVERY 6 HOURS AS NEEDED FOR WHEEZING OR SHORTNESS OF BREATH, Disp: 18 g, Rfl: 3   alprazolam (XANAX) 2 MG tablet, Take 2 mg by mouth 3 (three) times daily as needed (anxiety.). Pt taking 1/2 to 1 pill three times a day as needed., Disp: , Rfl:    ARIPiprazole (ABILIFY) 20 MG tablet, Take 20 mg by mouth at bedtime. , Disp: , Rfl:    aspirin EC 81 MG tablet, Take 81 mg by mouth daily. Swallow whole., Disp: , Rfl:    atenolol (TENORMIN) 100 MG tablet, Take 100 mg by mouth 2 (two) times daily., Disp: , Rfl:    CALCIUM PO, Take 1 tablet by mouth daily., Disp: , Rfl:    Cholecalciferol (VITAMIN D) 2000 units tablet, Take 2,000 Units by mouth daily., Disp: , Rfl:    citalopram (CELEXA) 20 MG tablet, , Disp: , Rfl:    erythromycin ophthalmic ointment, , Disp: , Rfl:    fenofibrate 160 MG tablet, Take 160 mg by mouth daily with supper. , Disp: , Rfl:    fluticasone (FLONASE) 50 MCG/ACT nasal spray, Place 2  sprays into both nostrils daily as needed for allergies or rhinitis., Disp: , Rfl:    Fluticasone-Salmeterol (ADVAIR) 250-50 MCG/DOSE AEPB, , Disp: , Rfl:    hydrocortisone 2.5 % cream, Apply 1 application topically 2 (two) times daily as needed., Disp: , Rfl:    levothyroxine (SYNTHROID) 150 MCG tablet, TAKE 1 TABLET(150 MCG) BY MOUTH DAILY, Disp: 90 tablet, Rfl: 0   Multiple Vitamin (MULTIVITAMIN WITH MINERALS) TABS tablet, Take 1 tablet by mouth daily., Disp: , Rfl:    ofloxacin (OCUFLOX) 0.3 % ophthalmic solution, , Disp: , Rfl:    omeprazole (PRILOSEC) 20 MG capsule, Take 20 mg by mouth daily before breakfast. , Disp: , Rfl:    rosuvastatin (CRESTOR) 10 MG tablet, Take 10 mg by mouth daily. , Disp: , Rfl:    Tiotropium Bromide Monohydrate (SPIRIVA RESPIMAT) 2.5 MCG/ACT AERS, Inhale 2 puffs into the lungs daily., Disp: 4 g, Rfl: 11  Allergies: No Known Allergies  Past Medical History, Surgical history, Social history, and Family History were reviewed and updated.  Review of Systems: Review of Systems  Constitutional: Negative.   HENT:  Negative.    Eyes: Negative.   Respiratory: Negative.    Cardiovascular: Negative.   Gastrointestinal: Negative.  Negative for  abdominal distention.  Endocrine: Negative.   Genitourinary: Negative.    Musculoskeletal:  Positive for gait problem.  Skin: Negative.   Neurological:  Positive for gait problem.  Hematological: Negative.   Psychiatric/Behavioral: Negative.     Physical Exam:  vitals were not taken for this visit.   Wt Readings from Last 3 Encounters:  02/21/21 272 lb (123.4 kg)  12/25/20 264 lb 3.2 oz (119.8 kg)  11/14/20 271 lb (122.9 kg)    Physical Exam Vitals reviewed.  HENT:     Head: Normocephalic and atraumatic.  Eyes:     Pupils: Pupils are equal, round, and reactive to light.  Cardiovascular:     Rate and Rhythm: Normal rate and regular rhythm.     Heart sounds: Normal heart sounds.  Pulmonary:     Effort:  Pulmonary effort is normal.     Breath sounds: Normal breath sounds.  Abdominal:     General: Bowel sounds are normal.     Palpations: Abdomen is soft.  Musculoskeletal:        General: No tenderness or deformity. Normal range of motion.     Cervical back: Normal range of motion.  Lymphadenopathy:     Cervical: No cervical adenopathy.  Skin:    General: Skin is warm and dry.     Findings: No erythema or rash.  Neurological:     Mental Status: He is alert and oriented to person, place, and time.  Psychiatric:        Behavior: Behavior normal.        Thought Content: Thought content normal.        Judgment: Judgment normal.   Lab Results  Component Value Date   WBC 7.3 06/25/2021   HGB 16.5 06/25/2021   HCT 51.8 06/25/2021   MCV 95.0 06/25/2021   PLT 240 06/25/2021     Chemistry      Component Value Date/Time   NA 140 02/21/2021 1128   K 4.4 02/21/2021 1128   CL 104 02/21/2021 1128   CO2 29 02/21/2021 1128   BUN 19 02/21/2021 1128   CREATININE 1.15 02/21/2021 1128      Component Value Date/Time   CALCIUM 9.3 02/21/2021 1128   ALKPHOS 32 (L) 02/21/2021 1128   AST 16 02/21/2021 1128   ALT 12 02/21/2021 1128   BILITOT 0.5 02/21/2021 1128      Impression and Plan: Timothy Conway is a very nice 65 year old white male.  He had a non small cell lung cancer of the left upper lung.  This was an adenocarcinoma.  It was a low-grade adenocarcinoma based on the low level of activity on the PET scan.   I feel good about the CT scan.  I do not think we need another scan on him until November.  If at that scan looks fine, then we can certainly move everything out to 6 months.  I know he will have a good summer and fall.  I told him to make sure he stays well-hydrated.Marland Kitchen   Timothy Napoleon, MD  7/27/20222:17 PM

## 2021-06-27 ENCOUNTER — Telehealth: Payer: Self-pay | Admitting: *Deleted

## 2021-06-27 NOTE — Telephone Encounter (Signed)
Called patient to let him know of imaging CT Chest before having labs and seeing Dr..Ennever. Patient confirmed.

## 2021-06-30 ENCOUNTER — Other Ambulatory Visit: Payer: Medicare HMO

## 2021-07-03 DIAGNOSIS — Z6835 Body mass index (BMI) 35.0-35.9, adult: Secondary | ICD-10-CM | POA: Diagnosis not present

## 2021-07-03 DIAGNOSIS — D7589 Other specified diseases of blood and blood-forming organs: Secondary | ICD-10-CM | POA: Diagnosis not present

## 2021-07-03 DIAGNOSIS — M546 Pain in thoracic spine: Secondary | ICD-10-CM | POA: Diagnosis not present

## 2021-07-03 DIAGNOSIS — M8008XA Age-related osteoporosis with current pathological fracture, vertebra(e), initial encounter for fracture: Secondary | ICD-10-CM | POA: Diagnosis not present

## 2021-07-03 DIAGNOSIS — S22080A Wedge compression fracture of T11-T12 vertebra, initial encounter for closed fracture: Secondary | ICD-10-CM | POA: Diagnosis not present

## 2021-07-03 DIAGNOSIS — C3490 Malignant neoplasm of unspecified part of unspecified bronchus or lung: Secondary | ICD-10-CM | POA: Diagnosis not present

## 2021-07-15 DIAGNOSIS — C3490 Malignant neoplasm of unspecified part of unspecified bronchus or lung: Secondary | ICD-10-CM | POA: Diagnosis not present

## 2021-07-15 DIAGNOSIS — M545 Low back pain, unspecified: Secondary | ICD-10-CM | POA: Diagnosis not present

## 2021-07-15 DIAGNOSIS — S22080A Wedge compression fracture of T11-T12 vertebra, initial encounter for closed fracture: Secondary | ICD-10-CM | POA: Diagnosis not present

## 2021-07-15 DIAGNOSIS — M8008XA Age-related osteoporosis with current pathological fracture, vertebra(e), initial encounter for fracture: Secondary | ICD-10-CM | POA: Diagnosis not present

## 2021-07-15 DIAGNOSIS — M546 Pain in thoracic spine: Secondary | ICD-10-CM | POA: Diagnosis not present

## 2021-07-23 ENCOUNTER — Other Ambulatory Visit: Payer: Self-pay | Admitting: Orthopaedic Surgery

## 2021-07-23 DIAGNOSIS — M47896 Other spondylosis, lumbar region: Secondary | ICD-10-CM

## 2021-08-02 ENCOUNTER — Other Ambulatory Visit: Payer: Medicare HMO

## 2021-08-09 ENCOUNTER — Ambulatory Visit
Admission: RE | Admit: 2021-08-09 | Discharge: 2021-08-09 | Disposition: A | Discharge status: Home / Self Care | Payer: Medicare HMO | Source: Ambulatory Visit | Attending: Orthopaedic Surgery | Admitting: Orthopaedic Surgery

## 2021-08-09 DIAGNOSIS — M48061 Spinal stenosis, lumbar region without neurogenic claudication: Secondary | ICD-10-CM | POA: Diagnosis not present

## 2021-08-09 DIAGNOSIS — M47896 Other spondylosis, lumbar region: Secondary | ICD-10-CM

## 2021-08-09 IMAGING — MR MR LUMBAR SPINE W/O CM
4 of 5 series · 22 of 48 positions shown · non-contrast
Comparison: MRI of the lumbar spine [DATE].

CLINICAL DATA: Other osteoarthritis of spine, lumbar region.

EXAM:
MRI LUMBAR SPINE WITHOUT CONTRAST
TECHNIQUE: Multiplanar, multisequence MR imaging of the lumbar spine was
performed. No intravenous contrast was administered.

[Series 9: T1 · sagittal · 4.0mm · 0.78mm/px · 4 of 17 slices shown (1 of 2)]
[im 1/17]
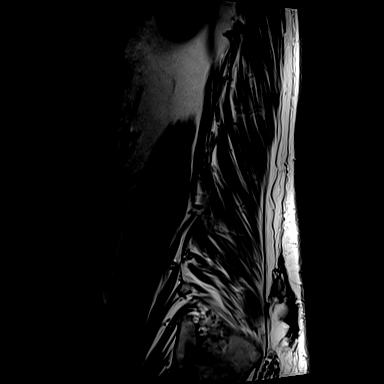
[im 4/17]
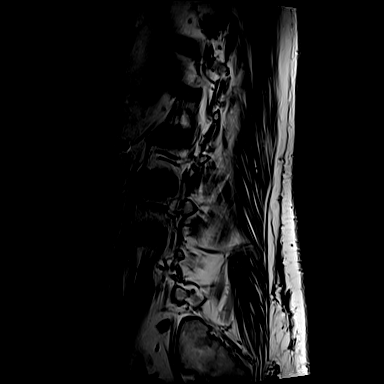
[im 10/17]
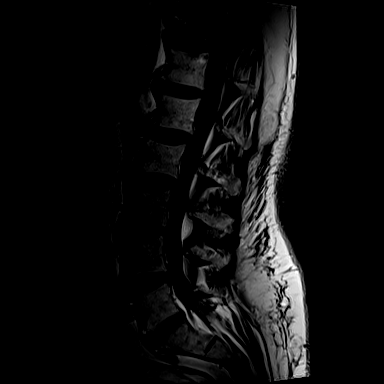
[im 17/17]
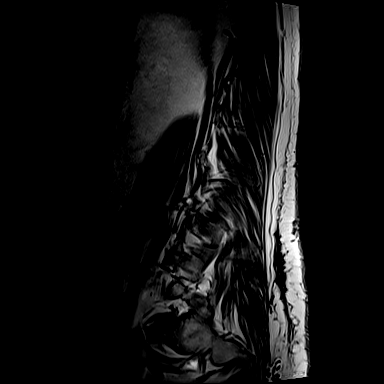

[Series 10: T2 · sagittal · 4.0mm · 0.73mm/px · 5 of 17 slices shown (1 of 2)]
[im 1/17]
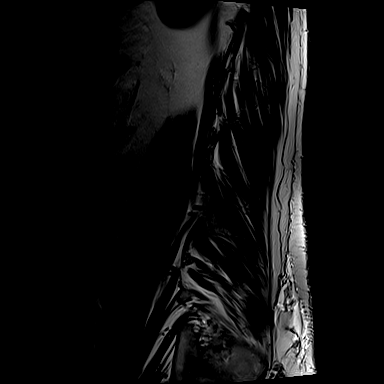
[im 5/17]
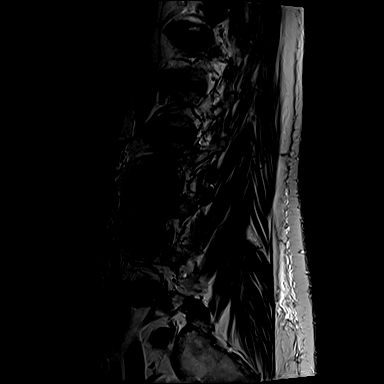
[im 9/17]
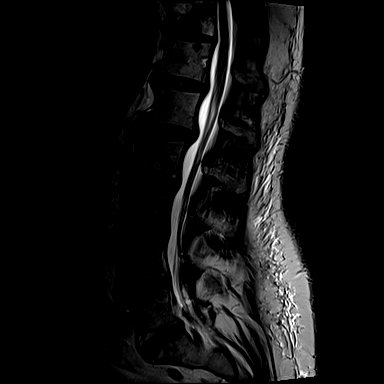
[im 13/17]
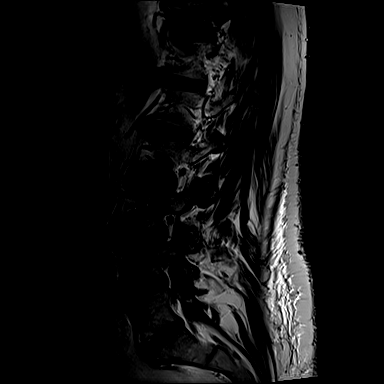
[im 17/17]
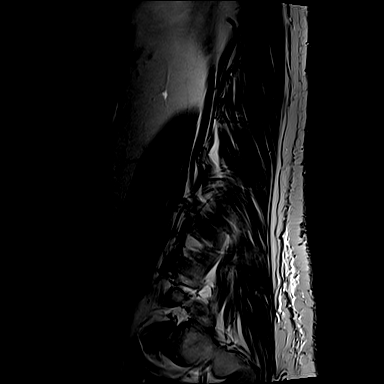

[Series 14: T1 · axial · 4.0mm · 0.35mm/px · z∈[-29,+174]mm · 3 of 55 slices shown (2 of 2)]
[im 8/55]
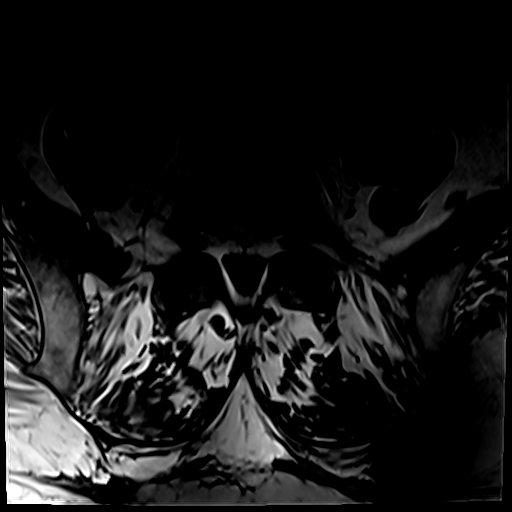
[im 29/55]
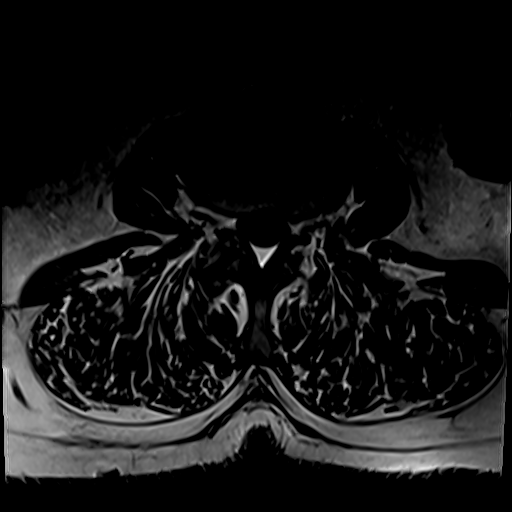
[im 47/55]
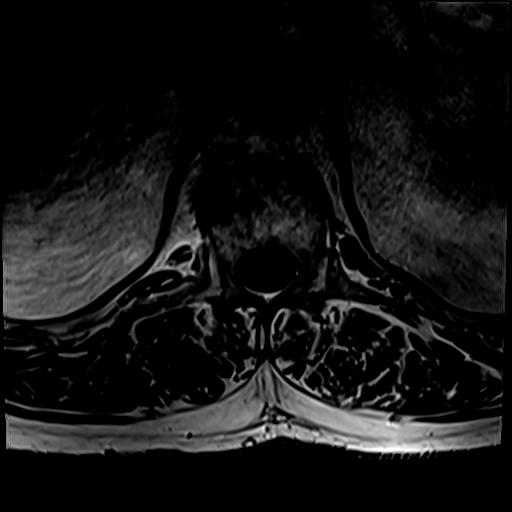

[Series 17: T2 · axial · 4.0mm · 0.35mm/px · z∈[-49,+214]mm · 10 of 55 slices shown (2 of 2)]
[im 4/55]
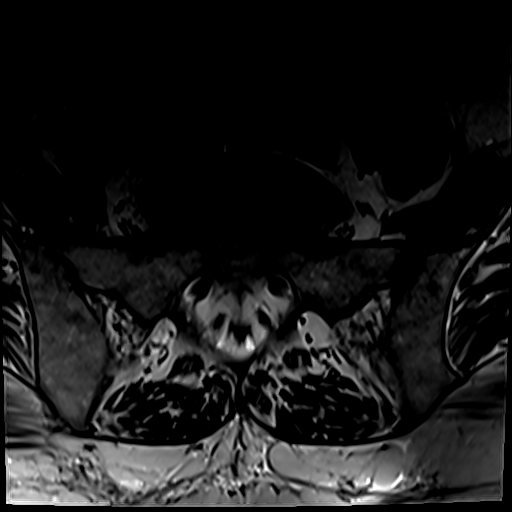
[im 8/55]
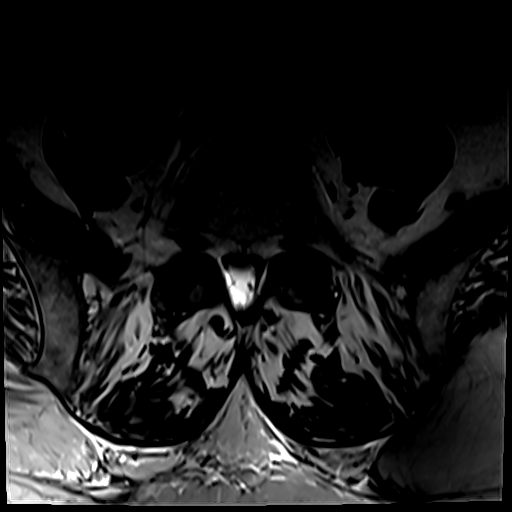
[im 11/55]
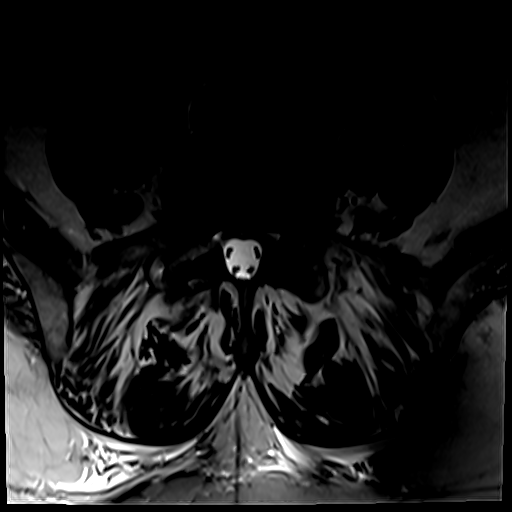
[im 19/55]
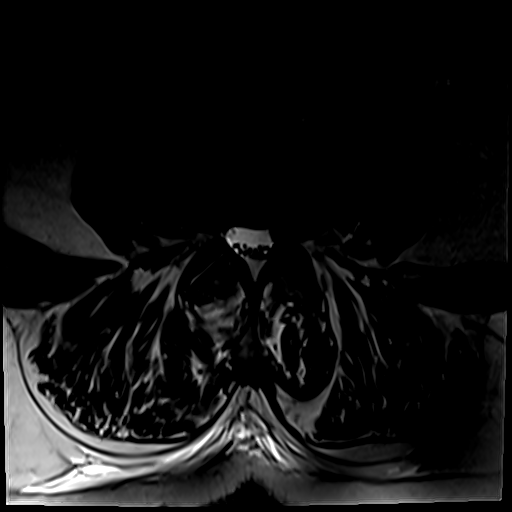
[im 26/55]
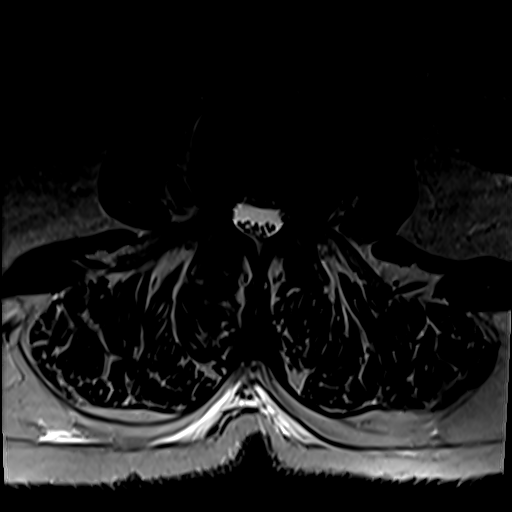
[im 29/55]
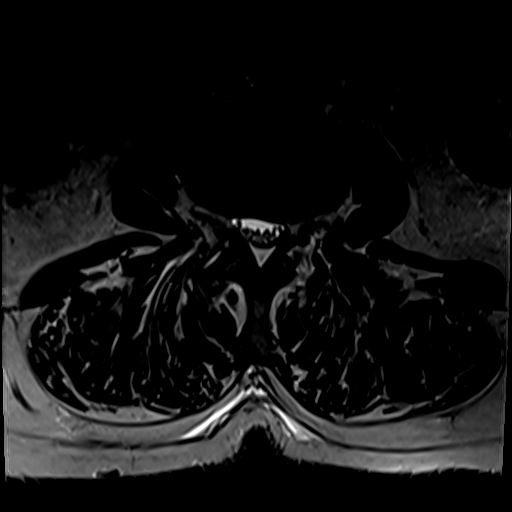
[im 33/55]
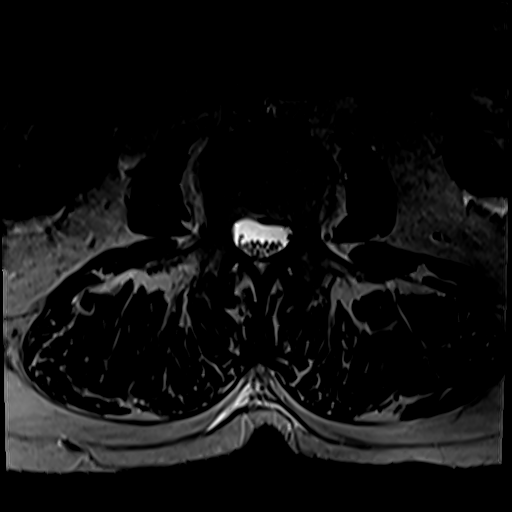
[im 40/55]
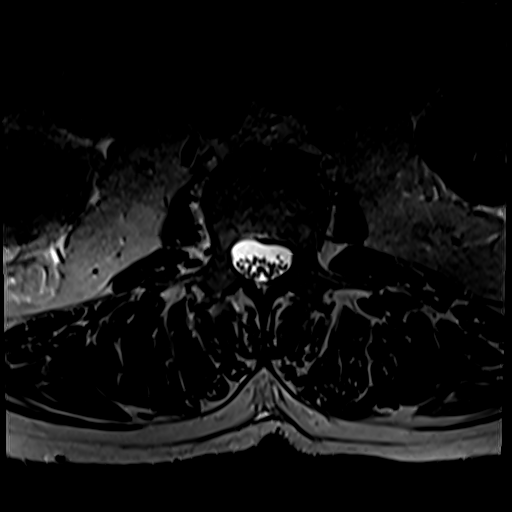
[im 47/55]
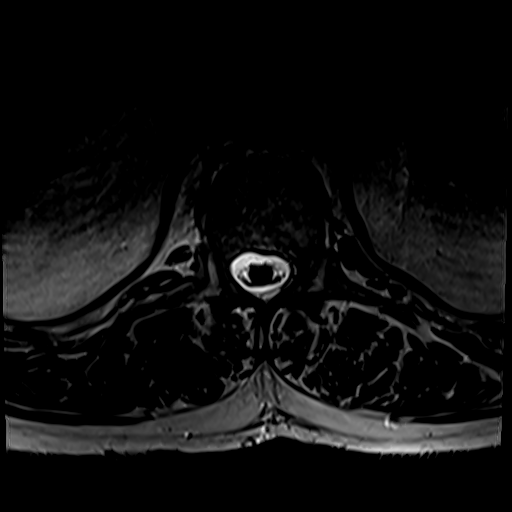
[im 55/55]
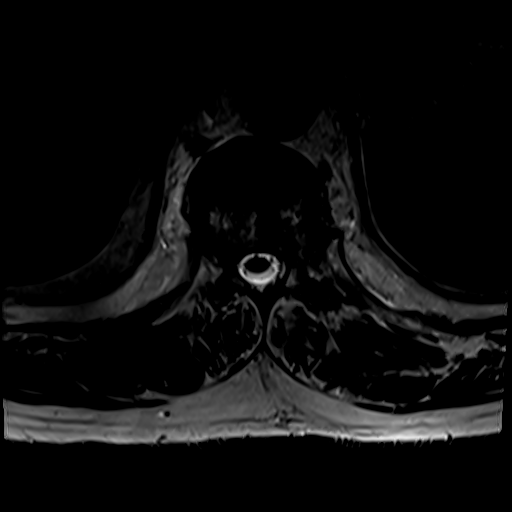

[22 of 48 positions shown; findings below may reference images not displayed]

FINDINGS: Segmentation:  Standard.

Alignment:  Small retrolisthesis of L1 over L2 and L2 over L3

Vertebrae: Compression fracture of the T11 vertebral body, status
post vertebral augmentation with persistent marrow edema. There is
loss of approximately 45% of vertebral body height. No retropulsion.
Mild compression fracture of the superior endplate of T12 on the
left with persistent marrow edema, unchanged from prior MRI. No
significant height loss. No new fracture identified. No aggressive
bone lesion or evidence of discitis.

Conus medullaris and cauda equina: Conus extends to the L1 level.
Conus and cauda equina appear normal.

Paraspinal and other soft tissues: Small right renal cyst.

Disc levels:

T11-12: Shallow disc bulge, mild right and moderate left facet
degenerative changes. No significant spinal canal or neural
foraminal stenosis.

L1-2: Left posterolateral disc protrusion causing indentation of the
thecal sac without significant spinal canal or neural foraminal
stenosis.

L1-2: Disc bulge with superimposed left central disc protrusion and
mild facet degenerative changes resulting in mild spinal canal
stenosis and mild bilateral neural foraminal narrowing.

L2-3: Left asymmetric disc bulge, mild-to-moderate facet
degenerative changes and mild ligamentum flavum redundancy resulting
in mild spinal canal stenosis with narrowing of the left
subarticular zone and mild bilateral neural foraminal narrowing.

L3-4: Disc bulge, mild-to-moderate facet degenerative changes mild
ligamentum flavum redundancy resulting in mild spinal canal stenosis
and mild bilateral neural foraminal narrowing.

L4-5: Shallow disc bulge and moderate hypertrophic facet
degenerative changes with ligamentum flavum redundancy resulting in
mild spinal canal stenosis and mild bilateral neural foraminal
narrowing, right greater than left.

L5-S1: Endplate degenerative changes, shallow disc bulge and
moderate facet degenerative changes. No significant spinal canal or
neural foraminal stenosis.

No significant interval progression of degenerative changes.
IMPRESSION: 1. Compression fracture of the T11 vertebral body status post
vertebral augmentation with persistent marrow edema.
2. Minor compression fracture of the superior endplate of T12 on the
right side with persistent marrow edema without significant height
loss.
3. Degenerative changes of the lumbar spine with mild spinal canal
stenosis from L2-3 through L4-5, stable.
4. Multilevel mild neural foraminal stenosis, as described above. No
high-grade neural foraminal stenosis.

## 2021-08-22 DIAGNOSIS — M546 Pain in thoracic spine: Secondary | ICD-10-CM | POA: Diagnosis not present

## 2021-08-22 DIAGNOSIS — M47896 Other spondylosis, lumbar region: Secondary | ICD-10-CM | POA: Diagnosis not present

## 2021-08-22 DIAGNOSIS — C3490 Malignant neoplasm of unspecified part of unspecified bronchus or lung: Secondary | ICD-10-CM | POA: Diagnosis not present

## 2021-08-22 DIAGNOSIS — S22080A Wedge compression fracture of T11-T12 vertebra, initial encounter for closed fracture: Secondary | ICD-10-CM | POA: Diagnosis not present

## 2021-08-28 DIAGNOSIS — M8008XA Age-related osteoporosis with current pathological fracture, vertebra(e), initial encounter for fracture: Secondary | ICD-10-CM | POA: Diagnosis not present

## 2021-08-28 DIAGNOSIS — C3490 Malignant neoplasm of unspecified part of unspecified bronchus or lung: Secondary | ICD-10-CM | POA: Diagnosis not present

## 2021-08-28 DIAGNOSIS — S22080A Wedge compression fracture of T11-T12 vertebra, initial encounter for closed fracture: Secondary | ICD-10-CM | POA: Diagnosis not present

## 2021-08-28 DIAGNOSIS — M8088XA Other osteoporosis with current pathological fracture, vertebra(e), initial encounter for fracture: Secondary | ICD-10-CM | POA: Diagnosis not present

## 2021-08-30 ENCOUNTER — Other Ambulatory Visit: Payer: Self-pay | Admitting: Internal Medicine

## 2021-09-01 NOTE — Telephone Encounter (Signed)
Had to reschedule pt for a new appt. Needs Levothyroxine refill to [facility] in [place]. Pt contact [phone]

## 2021-09-02 ENCOUNTER — Ambulatory Visit: Payer: Medicare HMO | Admitting: Internal Medicine

## 2021-09-03 ENCOUNTER — Ambulatory Visit: Payer: Medicare HMO | Admitting: Internal Medicine

## 2021-09-11 ENCOUNTER — Ambulatory Visit (INDEPENDENT_AMBULATORY_CARE_PROVIDER_SITE_OTHER): Payer: Medicare HMO | Admitting: Internal Medicine

## 2021-09-11 ENCOUNTER — Encounter: Payer: Self-pay | Admitting: Endocrinology

## 2021-09-11 ENCOUNTER — Other Ambulatory Visit: Payer: Self-pay

## 2021-09-11 VITALS — BP 126/80 | HR 64 | Ht 72.0 in | Wt 273.6 lb

## 2021-09-11 DIAGNOSIS — M8008XS Age-related osteoporosis with current pathological fracture, vertebra(e), sequela: Secondary | ICD-10-CM

## 2021-09-11 DIAGNOSIS — E559 Vitamin D deficiency, unspecified: Secondary | ICD-10-CM

## 2021-09-11 DIAGNOSIS — C73 Malignant neoplasm of thyroid gland: Secondary | ICD-10-CM

## 2021-09-11 DIAGNOSIS — E890 Postprocedural hypothyroidism: Secondary | ICD-10-CM | POA: Diagnosis not present

## 2021-09-11 LAB — T4, FREE: Free T4: 1.17 ng/dL (ref 0.60–1.60)

## 2021-09-11 LAB — TSH: TSH: 0.15 u[IU]/mL — ABNORMAL LOW (ref 0.35–5.50)

## 2021-09-11 LAB — VITAMIN D 25 HYDROXY (VIT D DEFICIENCY, FRACTURES): VITD: 60.58 ng/mL (ref 30.00–100.00)

## 2021-09-11 NOTE — Progress Notes (Addendum)
Patient ID: Timothy Conway, male   DOB: 1956/07/18, 65 y.o.   MRN: [id number]   This visit occurred during the SARS-CoV-2 public health emergency.  Safety protocols were in place, including screening questions prior to the visit, additional usage of staff PPE, and extensive cleaning of exam room while observing appropriate contact time as indicated for disinfecting solutions.   HPI  Timothy Conway is a 65 y.o.-year-old male, initially referred by Dr. Harlow Asa, presenting for follow-up for papillary thyroid cancer and postsurgical hypothyroidism.  Last visit 9 months ago.  Interim history: In 2021, he was diagnosed with 11 mm left adenocarcinoma. He had radiotactic RxTx.  CT scan in 2021 showed a decrease in size of his tumor.  He continues to be followed by oncology. This spring, he fell >> landed on his tailbone and fractured 1 vertebra >> had kyphoplasty, then had another compression fracture. He does have back pain >> now improved.   ThyCA: Reviewed and addended his thyroid cancer history: Pt. has been found to have a hypermetabolic thyroid nodule on a PET scan (07/13/2017) obtained by Dr. Lamonte Sakai, his pulmonologist, to investigate a lung nodule.  A thyroid U/S (07/30/2017) showed a L lobe 1.3 x 1.0 x 1.2 cm nodule, solid, hypoechoic, lobulated/irregular, with peripheral calcifications. Bx of this nodule (07/30/2017): PTC  Total thyroidectomy (08/27/2017) by Dr. Harlow Asa: Diagnosis 1. Thyroid, thyroidectomy, total - PAPILLARY THYROID CARCINOMA, CLASSIC TYPE, SPANNING 1.4 CM. - NO EXTRATHYROIDAL EXTENSION. - RESECTION MARGINS ARE NEGATIVE. - SEE ONCOLOGY TABLE. 2. Lymph node, biopsy, central compartment - THREE OF THREE LYMPH NODES NEGATIVE FOR CARCINOMA (0/3). - ONE BENIGN PARATHYROID. Microscopic Comment 1. THYROID Specimen: Total thyroid with central compartment lymph nodes. Procedure (including lymph node sampling if applicable): Total thyroidectomy with central lymph node  biopsy. Specimen Integrity (intact/fragmented): Intact. Tumor focality: Unifocal. Dominant tumor: Maximum tumor size (cm): 1.4 cm. Tumor laterality: Left. Histologic type (including subtype and/or unique features as applicable): Papillary thyroid carcinoma, classic type. Tumor capsule: Present. Extrathyroidal extension: Absent. Capsular invasion with degree of invasion if present: N/A. Margins: Negative. Lymphatic or vascular invasion: Not identified. Lymph nodes: # examined 3; # positive; 0 Extracapsular extension (if applicable): N/A. TNM code: pT1b, pN0 Non-neoplastic thyroid: Largely unremarkable. Comments: There tumor is largely fibrotic and calcified with only a small portion of viable tumor.  Neck U/S (09/25/2019): The thyroid gland is surgically absent. Evaluation of the left and right resection bed demonstrates no evidence of residual thyroid tissue or nodularity. No suspicious lymphadenopathy is identified. IMPRESSION: Surgical changes of prior total thyroidectomy without evidence of residual or recurrent thyroid tissue, nodularity or lymphadenopathy.  PET/CT (09/15/2020): Increased uptake in the base of the tongue and anterior paravertebral muscles  CT neck (07/24/2020): Increased uptake is most likely related to tonsillar inflammation  CT chest (11/14/2020) without contrast: No suspicious masses in mediastinum or neck.  CT chest (06/24/2021): Mediastinum/Nodes: No enlarged mediastinal, hilar, or axillary lymph nodes. Thyroid gland, trachea, and esophagus demonstrate no significant findings.  Thyroglobulin is detectable Lab Results  Component Value Date   THYROGLB 0.1 (L) 10/29/2020   THYROGLB 0.7 (L) 09/17/2020   THYROGLB 0.1 (L) 09/14/2019   THYROGLB 0.2 (L) 09/08/2018   THYROGLB 0.1 (L) 03/23/2018   His antithyroid antibodies are undetectable: Lab Results  Component Value Date   THGAB <1 10/29/2020   THGAB <1 09/17/2020   THGAB <1 09/14/2019   THGAB  <1 09/08/2018   THGAB <1 03/23/2018   Postsurgical hypothyroidism  He is on Levothyroxine 150  mcg daily.  He takes this: - in am - fasting - at least 30 min from b'fast (however, he usually skips breakfast) - + calcium 1x a day with dinner - no iron - + multivitamins at night - + PPIs at night (Omeprazole) - not on Biotin + Magnesium and Zinc.  Reviewed his TFTs: Lab Results  Component Value Date   TSH 0.76 12/25/2020   TSH 0.28 (L) 10/29/2020   TSH 12.27 (H) 09/17/2020   TSH 0.64 09/14/2019   TSH 4.66 (H) 09/08/2018   TSH 0.52 03/23/2018   TSH 12.75 (H) 02/11/2018   TSH 2.01 11/12/2017   TSH 3.67 10/01/2017   FREET4 1.19 12/25/2020   FREET4 1.13 10/29/2020   FREET4 0.77 09/17/2020   FREET4 1.04 09/14/2019   FREET4 0.73 09/08/2018   FREET4 0.87 03/23/2018   FREET4 0.62 02/11/2018   FREET4 0.70 11/12/2017   FREET4 0.77 10/01/2017    Pt denies: - feeling nodules in neck - hoarseness - dysphagia - choking - SOB with lying down  Unknown family history of thyroid disease or thyroid cancer since he was adopted. No h/o radiation tx to head or neck.  No herbal supplements. No Biotin use. No recent steroids use.   Vertebral fracture:  After fall from level ground Spring 2022.  MRI lumbar spine (08/09/2021): 1. Compression fracture of the T11 vertebral body status post vertebral augmentation with persistent marrow edema. 2. Minor compression fracture of the superior endplate of T62 on the right side with persistent marrow edema without significant height loss.  He had a bone density scan several years ago in [facility] reportedly showing osteopenia.  No other fractures as an adult, but had a wrist fracture as a child.  He has a history of vitamin D deficiency.  No recent levels available for review. No results found for: VD25OH  He was advised by PCP to start 2000 units vitamin D daily 2-3 years ago >> he started 5000 units daily as he could not find a 2000  unit dose.  He continues on this dose now.  ROS: + See HPI Neurological: + tremors/no numbness/no tingling/no dizziness  I reviewed pt's medications, allergies, PMH, social hx, family hx, and changes were documented in the history of present illness. Otherwise, unchanged from my initial visit note.  Past Medical History:  Diagnosis Date   Anxiety    COPD (chronic obstructive pulmonary disease) (HCC)    Depression    Dry eye syndrome    Dysrhythmia    hx of PAC's   GERD (gastroesophageal reflux disease)    Gout    Hyperlipidemia    Hypertension    Hypothyroidism    Thyroid cancer ([place])    2018   Past Surgical History:  Procedure Laterality Date   ABDOMINAL SURGERY     hemmoraging ulcer   BRONCHIAL BIOPSY  06/25/2020   Procedure: BRONCHIAL BIOPSIES;  Surgeon: Collene Gobble, MD;  Location: [facility] ENDOSCOPY;  Service: Pulmonary;;   BRONCHIAL BRUSHINGS  06/25/2020   Procedure: BRONCHIAL BRUSHINGS;  Surgeon: Collene Gobble, MD;  Location: [facility] ENDOSCOPY;  Service: Pulmonary;;   BRONCHIAL NEEDLE ASPIRATION BIOPSY  06/25/2020   Procedure: BRONCHIAL NEEDLE ASPIRATION BIOPSIES;  Surgeon: Collene Gobble, MD;  Location: [place];  Service: Pulmonary;;   BRONCHIAL WASHINGS  06/25/2020   Procedure: BRONCHIAL WASHINGS;  Surgeon: Collene Gobble, MD;  Location: [facility] ENDOSCOPY;  Service: Pulmonary;;   CATARACT EXTRACTION Right    COLONOSCOPY     EYE  SURGERY Right    x 4  between ages 51-13   FIDUCIAL MARKER PLACEMENT  06/25/2020   Procedure: FIDUCIAL MARKER PLACEMENT;  Surgeon: Collene Gobble, MD;  Location: [facility] ENDOSCOPY;  Service: Pulmonary;;   THYROIDECTOMY N/A 08/27/2017   Procedure: TOTAL THYROIDECTOMY WITH LIMITED LYMPH NODE DISSECTION;  Surgeon: Armandina Gemma, MD;  Location: [facility] ORS;  Service: General;  Laterality: N/A;   UPPER GI ENDOSCOPY     VIDEO BRONCHOSCOPY WITH ENDOBRONCHIAL NAVIGATION N/A 06/25/2020   Procedure: VIDEO BRONCHOSCOPY WITH ENDOBRONCHIAL NAVIGATION;  Surgeon: Collene Gobble, MD;  Location: [facility] ENDOSCOPY;  Service: Pulmonary;  Laterality: N/A;   Social History   Social History   Marital status: Single    Spouse name: N/A   Number of children: 0   Occupational History   n/a   Social History Main Topics   Smoking status: Current Every Day Smoker    Packs/day: 1.00    Years: 40.00    Types: Cigarettes   Smokeless tobacco: Never Used     Comment: 1 1/2 per day   Alcohol use 0.6 oz/week    3-4 Cans of beer per day   Drug use: No   Current Outpatient Medications on File Prior to Visit  Medication Sig Dispense Refill   albuterol (VENTOLIN HFA) 108 (90 Base) MCG/ACT inhaler INHALE 2 PUFFS INTO THE LUNGS EVERY 6 HOURS AS NEEDED FOR WHEEZING OR SHORTNESS OF BREATH 18 g 3   alprazolam (XANAX) 2 MG tablet Take 2 mg by mouth 3 (three) times daily as needed (anxiety.). Pt taking 1/2 to 1 pill three times a day as needed.     ARIPiprazole (ABILIFY) 20 MG tablet Take 20 mg by mouth at bedtime.      aspirin EC 81 MG tablet Take 81 mg by mouth daily. Swallow whole.     atenolol (TENORMIN) 100 MG tablet Take 100 mg by mouth 2 (two) times daily.     CALCIUM PO Take 1 tablet by mouth daily.     Cholecalciferol (VITAMIN D) 2000 units tablet Take 2,000 Units by mouth daily.     erythromycin ophthalmic ointment      fenofibrate 160 MG tablet Take 160 mg by mouth daily with supper.      fluticasone (FLONASE) 50 MCG/ACT nasal spray Place 2 sprays into both nostrils daily as needed for allergies or rhinitis.     Fluticasone-Salmeterol (ADVAIR) 250-50 MCG/DOSE AEPB      hydrocortisone 2.5 % cream Apply 1 application topically 2 (two) times daily as needed.     levothyroxine (SYNTHROID) 150 MCG tablet TAKE 1 TABLET(150 MCG) BY MOUTH DAILY 15 tablet 0   Multiple Vitamin (MULTIVITAMIN WITH MINERALS) TABS tablet Take 1 tablet by mouth daily.     ofloxacin (OCUFLOX) 0.3 % ophthalmic solution      omeprazole (PRILOSEC) 20 MG capsule Take 20 mg by mouth daily before  breakfast.      rosuvastatin (CRESTOR) 10 MG tablet Take 10 mg by mouth daily.      Tiotropium Bromide Monohydrate (SPIRIVA RESPIMAT) 2.5 MCG/ACT AERS Inhale 2 puffs into the lungs daily. 4 g 11   No current facility-administered medications on file prior to visit.   No Known Allergies Family History  Adopted: Yes    PE: BP 126/80 (BP Location: Left Arm, Patient Position: Sitting, Cuff Size: Normal)   Pulse 64   Ht 6' (1.829 m)   Wt 273 lb 9.6 oz (124.1 kg)   SpO2 96%  BMI 37.11 kg/m  Wt Readings from Last 3 Encounters:  09/11/21 273 lb 9.6 oz (124.1 kg)  06/25/21 269 lb (122 kg)  02/21/21 272 lb (123.4 kg)   Constitutional: overweight, in NAD Eyes: PERRLA, EOMI, no exophthalmos ENT: moist mucous membranes, no neck masses palpated, no cervical lymphadenopathy Cardiovascular: RRR, No MRG Respiratory: CTA B Gastrointestinal: abdomen soft, NT, ND, BS+ Musculoskeletal: no deformities, strength intact in all 4 Skin: moist, warm, no rashes Neurological: + tremor with outstretched hands, DTR normal in all 4  ASSESSMENT: 1. Thyroid cancer - see HPI  2. Postsurgical Hypothyroidism  3. Vertebral fracture  PLAN:  1. Papillary Thyroid Cancer -Patient with history of classic papillary thyroid cancer, not very large, encapsulated, without extrathyroidal extension, without lymphovascular invasion and negative lymph node biopsies.  Therefore, his cancer was considered low risk and we decided to continue without RAI treatment for the 2015 ATA thyroid cancer guidelines.  We are following with thyroglobulin tumor marker and also with neck ultrasounds.  We did discuss that even if he ends up needing RAI treatment as a salvage therapy in the future, his prognosis would likely not be changed. -Reviewed together latest neck ultrasound and this did not show any metastasis or recurrence in the neck.  Also, PET scan and CT scans obtained during his lung cancer staging did not show thyroid cancer  recurrences or metastasis. -His thyroglobulin level remains detectable, as expected, since he did not have RAI treatment.  However, at last visit, thyroglobulin was lower, barely detectable, at 0.1.  ATA antibodies were undetectable. -We will continue to follow his thyroglobulin levels.  We will recheck these today. -I will see him back in 6 mo  2. Patient with history of total thyroidectomy for thyroid cancer, now with iatrogenic hypothyroidism, on Synthroid therapy.  He had fatigue in the past which improved after increasing his dose of thyroid hormone. - latest thyroid labs reviewed with pt. >> normal: Lab Results  Component Value Date   TSH 0.76 12/25/2020  - he continues on LT4 150 mcg daily - pt feels good on this dose.  He continues to have mild tremors, which are not new.  No heat intolerance or palpitations.  No net weight loss since last visit. - we discussed about taking the thyroid hormone every day, with water, >30 minutes before breakfast, separated by >4 hours from acid reflux medications, calcium, iron, multivitamins. Pt. is taking it correctly. - will check thyroid tests today: TSH and fT4 - If labs are abnormal, he will need to return for repeat TFTs in 1.5 months  3. Vertebral fracture -Since this was a consequence of a fall from level ground, this puts him in the osteoporotic category - x2 -one of the vertebrae was reconstructed by kyphoplasty -His back pain has improved  -we will check a bone density scan; he apparently had a bone density in the past that showed osteopenia (I do not have these records) -Afterwards, he most likely will need an IV bisphosphonates or Prolia.  He has a contraindication to [employer] or Forteo due to his previous history of radiation therapy. -At this visit, we will check a vitamin D level since he does have a history of vitamin D deficiency.  He is taking 5000 units vitamin D daily.  Needs refills.  Component     Latest Ref Rng & Units  09/11/2021  Thyroglobulin     ng/mL 0.3 (L)  TSH     0.35 - 5.50 uIU/mL 0.15 (L)  T4,Free(Direct)     0.60 - 1.60 ng/dL 1.17  Thyroglobulin Ab     < or = 1 IU/mL <1  VITD     30.00 - 100.00 ng/mL 60.58   Vitamin D is adequate.  We will continue the same supplement dose. Thyroglobulin is slightly higher than before.  For now, I would not suggest intervention, but we will recheck his thyroid labs when he comes back again 6 months. TSH is suppressed.  We will need to back off the levothyroxine dose to 137 mcg daily and recheck his test in 1.5 months.  DXA:   Lumbar spine L1-L4 Femoral neck (FN)  T-score  1.2 RFN: -1.0 LFN: -1.2    Assessment: By the [facility] Criteria for diagnosis based on bone density, this patient has Low Bone Density    FRAX 10-year fracture risk calculator: 9.7 % for any major fracture and 1.5 % for hip fracture. Pharmacologic therapy is recommended if 10 year fracture risk is >20% for any major osteoporotic fracture or >3% for hip fracture.     With a history of vertebral fracture, despite his osteopenia on the DXA scan, he has clinical osteoporosis.  I would suggest Fosamax 70 mg weekly.  Will discuss with him.  Reviewed his kidney function, which allows bisphosphonates: Lab Results  Component Value Date   BUN 25 (H) 06/25/2021   Lab Results  Component Value Date   CREATININE 1.09 06/25/2021   Lab Results  Component Value Date   GFRNONAA >60 06/25/2021   GFRNONAA >60 02/21/2021   GFRNONAA >60 11/14/2020   GFRNONAA 58 (L) 09/06/2020   GFRNONAA >60 07/08/2020   GFRNONAA >60 06/25/2020   GFRNONAA >60 08/28/2017   GFRNONAA >60 08/25/2017   Philemon Kingdom, MD PhD [facility] Endocrinology

## 2021-09-11 NOTE — Patient Instructions (Addendum)
Patient Instructions  Please continue Synthroid 150 mcg daily  Take the thyroid hormone every day, with water, at least 30 minutes before breakfast, separated by at least 4 hours from: - acid reflux medications - calcium - iron - multivitamins  Please stop at the lab.  Please call and schedule bone density scan at the [facility]: [phone].   Please come back for a follow-up appointment in 6 months.

## 2021-09-12 ENCOUNTER — Encounter: Payer: Self-pay | Admitting: Internal Medicine

## 2021-09-12 LAB — THYROGLOBULIN LEVEL: Thyroglobulin: 0.3 ng/mL — ABNORMAL LOW

## 2021-09-12 LAB — THYROGLOBULIN ANTIBODY: Thyroglobulin Ab: <1 IU/mL (ref <=1)

## 2021-09-12 MED ORDER — LEVOTHYROXINE SODIUM 137 MCG PO TABS: 137.0000 ug | ORAL_TABLET | Freq: Every day | ORAL | 3 refills | Status: DC

## 2021-09-15 ENCOUNTER — Telehealth: Payer: Self-pay | Admitting: Internal Medicine

## 2021-09-15 NOTE — Telephone Encounter (Signed)
Per [facility] patient called to advise that he has a Bone Density scan scheduled for Tuesday and there are no orders for it.    Call back # [phone]

## 2021-09-15 NOTE — Telephone Encounter (Signed)
Pt called and said his levothyroxine prescription never made it to his [facility] in [place] on [address]. Pt contact [phone]

## 2021-09-16 ENCOUNTER — Other Ambulatory Visit: Payer: Self-pay

## 2021-09-16 ENCOUNTER — Ambulatory Visit (INDEPENDENT_AMBULATORY_CARE_PROVIDER_SITE_OTHER)
Admission: RE | Admit: 2021-09-16 | Discharge: 2021-09-16 | Disposition: A | Discharge status: Home / Self Care | Payer: Medicare HMO | Source: Ambulatory Visit | Attending: Internal Medicine | Admitting: Internal Medicine

## 2021-09-16 DIAGNOSIS — M8008XA Age-related osteoporosis with current pathological fracture, vertebra(e), initial encounter for fracture: Secondary | ICD-10-CM | POA: Diagnosis not present

## 2021-09-16 DIAGNOSIS — M8008XS Age-related osteoporosis with current pathological fracture, vertebra(e), sequela: Secondary | ICD-10-CM | POA: Diagnosis not present

## 2021-09-16 MED ORDER — LEVOTHYROXINE SODIUM 137 MCG PO TABS
137.0000 ug | ORAL_TABLET | Freq: Every day | ORAL | 3 refills | Status: DC
Start: 2021-09-16 — End: 2021-10-28

## 2021-09-16 NOTE — Telephone Encounter (Signed)
Scan was done today

## 2021-09-18 ENCOUNTER — Ambulatory Visit: Payer: Medicare HMO | Admitting: Internal Medicine

## 2021-09-19 DIAGNOSIS — M545 Low back pain, unspecified: Secondary | ICD-10-CM | POA: Diagnosis not present

## 2021-09-19 DIAGNOSIS — I10 Essential (primary) hypertension: Secondary | ICD-10-CM | POA: Diagnosis not present

## 2021-09-19 DIAGNOSIS — Z6835 Body mass index (BMI) 35.0-35.9, adult: Secondary | ICD-10-CM | POA: Diagnosis not present

## 2021-09-19 DIAGNOSIS — M546 Pain in thoracic spine: Secondary | ICD-10-CM | POA: Diagnosis not present

## 2021-09-22 MED ORDER — ALENDRONATE SODIUM 70 MG PO TABS: 70.0000 mg | ORAL_TABLET | ORAL | 3 refills | Status: DC

## 2021-09-22 NOTE — Addendum Note (Signed)
Addended by: Philemon Kingdom on: 09/22/2021 05:16 PM   Modules accepted: Orders

## 2021-09-25 DIAGNOSIS — E039 Hypothyroidism, unspecified: Secondary | ICD-10-CM | POA: Diagnosis not present

## 2021-09-25 DIAGNOSIS — E7800 Pure hypercholesterolemia, unspecified: Secondary | ICD-10-CM | POA: Diagnosis not present

## 2021-09-25 DIAGNOSIS — I10 Essential (primary) hypertension: Secondary | ICD-10-CM | POA: Diagnosis not present

## 2021-09-25 DIAGNOSIS — F321 Major depressive disorder, single episode, moderate: Secondary | ICD-10-CM | POA: Diagnosis not present

## 2021-09-25 DIAGNOSIS — C3492 Malignant neoplasm of unspecified part of left bronchus or lung: Secondary | ICD-10-CM | POA: Diagnosis not present

## 2021-09-25 DIAGNOSIS — J449 Chronic obstructive pulmonary disease, unspecified: Secondary | ICD-10-CM | POA: Diagnosis not present

## 2021-10-16 ENCOUNTER — Other Ambulatory Visit: Payer: Self-pay

## 2021-10-16 ENCOUNTER — Inpatient Hospital Stay: Payer: Medicare HMO | Attending: Hematology & Oncology

## 2021-10-16 ENCOUNTER — Encounter: Payer: Self-pay | Admitting: Hematology & Oncology

## 2021-10-16 ENCOUNTER — Ambulatory Visit (HOSPITAL_BASED_OUTPATIENT_CLINIC_OR_DEPARTMENT_OTHER)
Admission: RE | Admit: 2021-10-16 | Discharge: 2021-10-16 | Disposition: A | Discharge status: Home / Self Care | Payer: Medicare HMO | Source: Ambulatory Visit | Attending: Hematology & Oncology | Admitting: Hematology & Oncology

## 2021-10-16 ENCOUNTER — Inpatient Hospital Stay: Payer: Medicare HMO | Admitting: Hematology & Oncology

## 2021-10-16 VITALS — BP 125/91 | HR 78 | Temp 99.5°F | Resp 20 | Wt 271.0 lb

## 2021-10-16 DIAGNOSIS — C3490 Malignant neoplasm of unspecified part of unspecified bronchus or lung: Secondary | ICD-10-CM | POA: Diagnosis not present

## 2021-10-16 DIAGNOSIS — C3412 Malignant neoplasm of upper lobe, left bronchus or lung: Secondary | ICD-10-CM | POA: Insufficient documentation

## 2021-10-16 DIAGNOSIS — C3492 Malignant neoplasm of unspecified part of left bronchus or lung: Secondary | ICD-10-CM | POA: Insufficient documentation

## 2021-10-16 DIAGNOSIS — I700 Atherosclerosis of aorta: Secondary | ICD-10-CM | POA: Diagnosis not present

## 2021-10-16 LAB — CBC WITH DIFFERENTIAL (CANCER CENTER ONLY)
Abs Immature Granulocytes: 0.02 10*3/uL (ref 0.00–0.07)
Basophils Absolute: 0.1 10*3/uL (ref 0.0–0.1)
Basophils Relative: 1 %
Eosinophils Absolute: 0.2 10*3/uL (ref 0.0–0.5)
Eosinophils Relative: 2 %
HCT: 47.3 % (ref 39.0–52.0)
Hemoglobin: 15.5 g/dL (ref 13.0–17.0)
Immature Granulocytes: 0 %
Lymphocytes Relative: 26 %
Lymphs Abs: 1.8 10*3/uL (ref 0.7–4.0)
MCH: 30.6 pg (ref 26.0–34.0)
MCHC: 32.8 g/dL (ref 30.0–36.0)
MCV: 93.5 fL (ref 80.0–100.0)
Monocytes Absolute: 0.5 10*3/uL (ref 0.1–1.0)
Monocytes Relative: 7 %
Neutro Abs: 4.4 10*3/uL (ref 1.7–7.7)
Neutrophils Relative %: 64 %
Platelet Count: 236 10*3/uL (ref 150–400)
RBC: 5.06 MIL/uL (ref 4.22–5.81)
RDW: 11.9 % (ref 11.5–15.5)
WBC Count: 6.9 10*3/uL (ref 4.0–10.5)
nRBC: 0 % (ref 0.0–0.2)

## 2021-10-16 LAB — CMP (CANCER CENTER ONLY)
ALT: 13 U/L (ref 0–44)
AST: 20 U/L (ref 15–41)
Albumin: 4.3 g/dL (ref 3.5–5.0)
Alkaline Phosphatase: 35 U/L — ABNORMAL LOW (ref 38–126)
Anion gap: 7 (ref 5–15)
BUN: 13 mg/dL (ref 8–23)
CO2: 30 mmol/L (ref 22–32)
Calcium: 8.8 mg/dL — ABNORMAL LOW (ref 8.9–10.3)
Chloride: 104 mmol/L (ref 98–111)
Creatinine: 1.11 mg/dL (ref 0.61–1.24)
GFR, Estimated: >60 mL/min (ref >60)
Glucose, Bld: 98 mg/dL (ref 70–99)
Potassium: 4.2 mmol/L (ref 3.5–5.1)
Sodium: 141 mmol/L (ref 135–145)
Total Bilirubin: 0.6 mg/dL (ref 0.3–1.2)
Total Protein: 7.1 g/dL (ref 6.5–8.1)

## 2021-10-16 IMAGING — CT CT CHEST W/O CM
2 of 3 series · 15 of 36 positions shown, 18 images · non-contrast
Comparison: [DATE]

CLINICAL DATA: Non-small cell lung cancer, local recurrence, assess
treatment response, status post radiotherapy

EXAM:
CT CHEST WITHOUT CONTRAST
TECHNIQUE: Multidetector CT imaging of the chest was performed following the
standard protocol without IV contrast.

[Series 2: thorax · axial · 0.90mm/px · z∈[-346,-54]mm · 12 of 172 slices shown, 15 images]
[im 13/172  mediastinal]
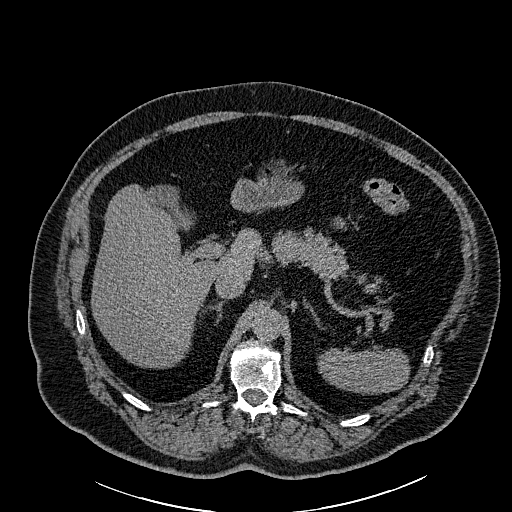
[im 13/172  lung]
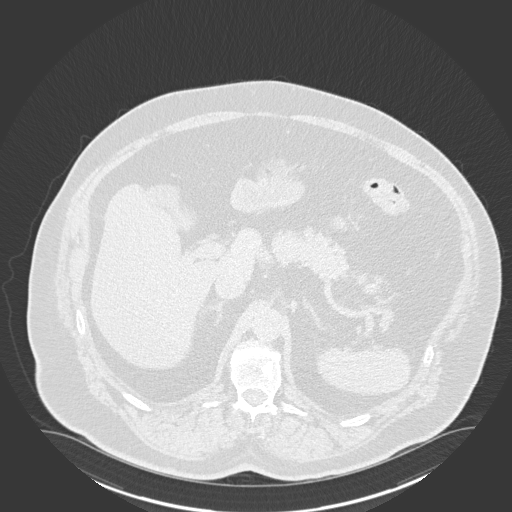
[im 26/172  lung]
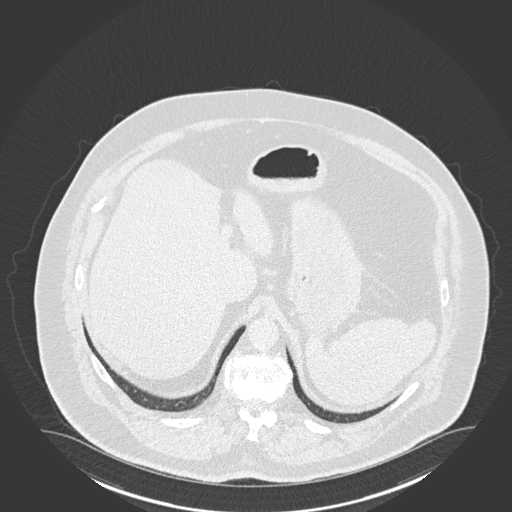
[im 39/172  lung]
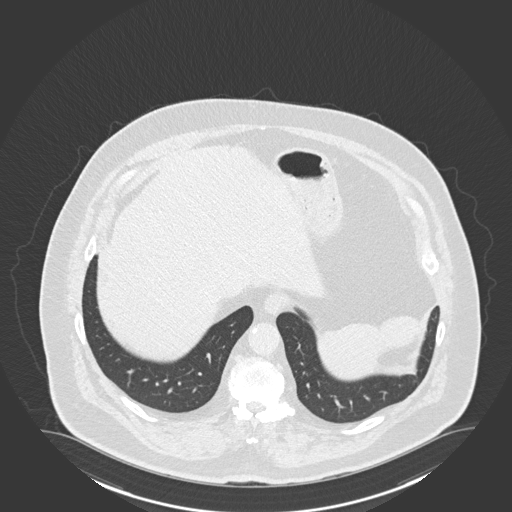
[im 51/172  lung]
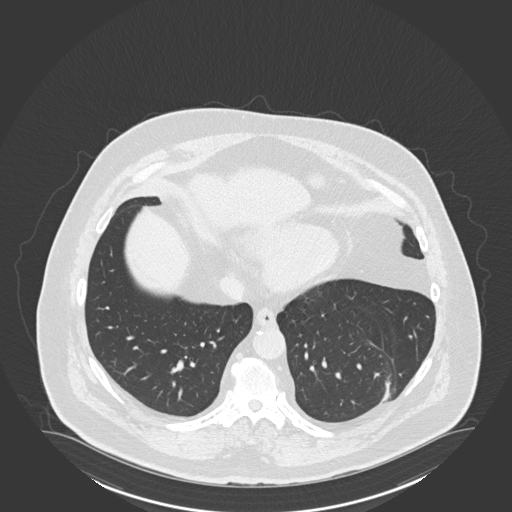
[im 64/172  mediastinal]
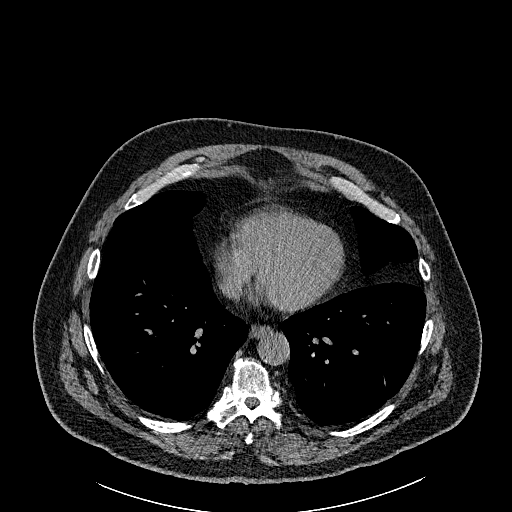
[im 64/172  lung]
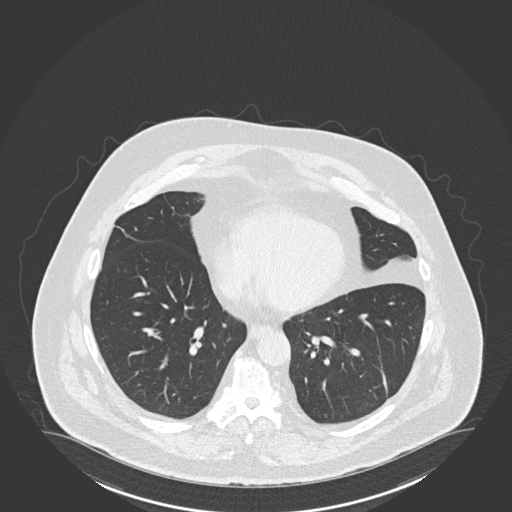
[im 77/172  lung]
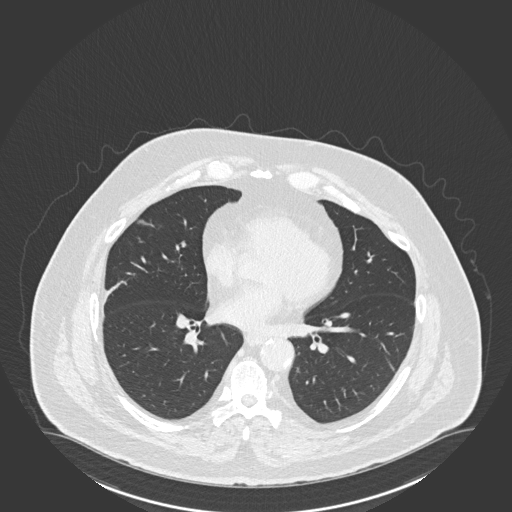
[im 96/172  lung]
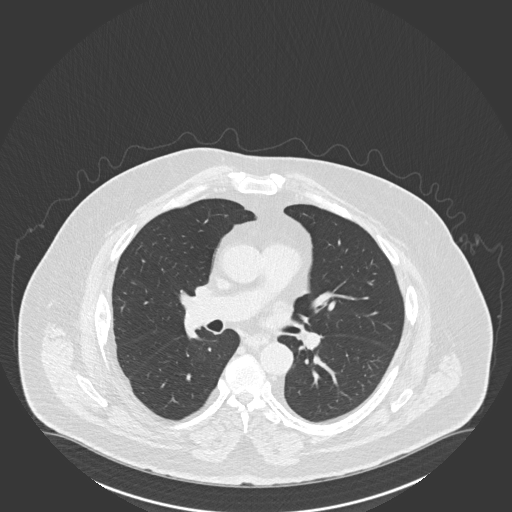
[im 108/172  lung]
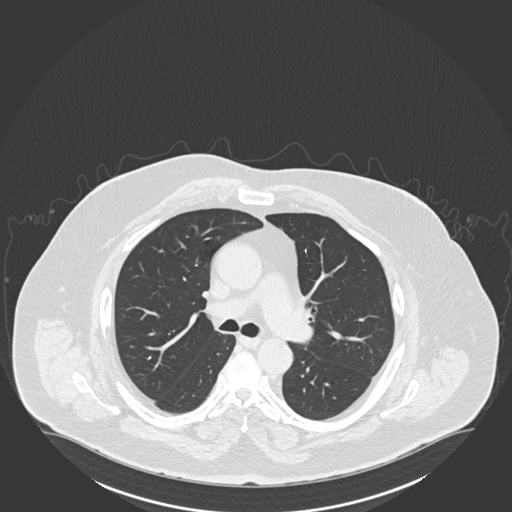
[im 121/172  mediastinal]
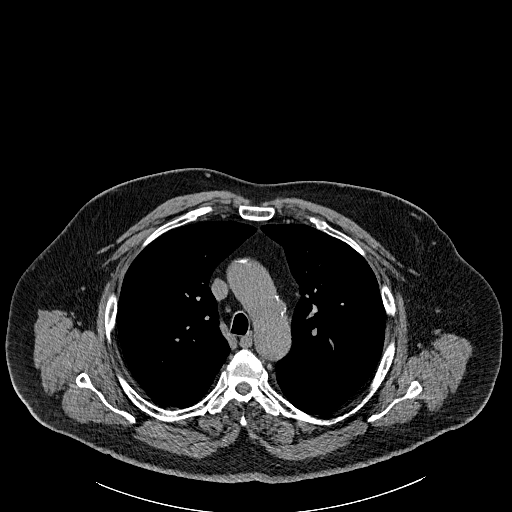
[im 121/172  lung]
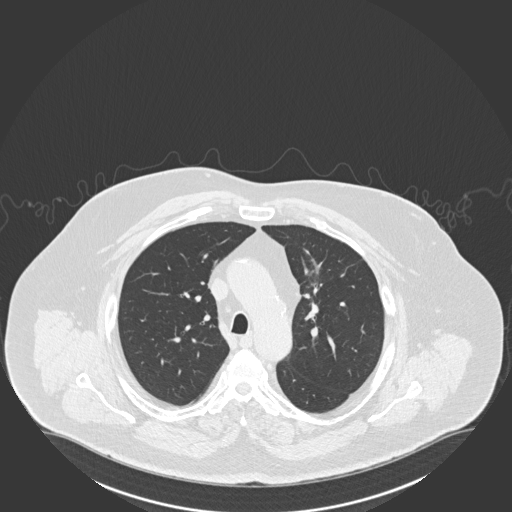
[im 134/172  lung]
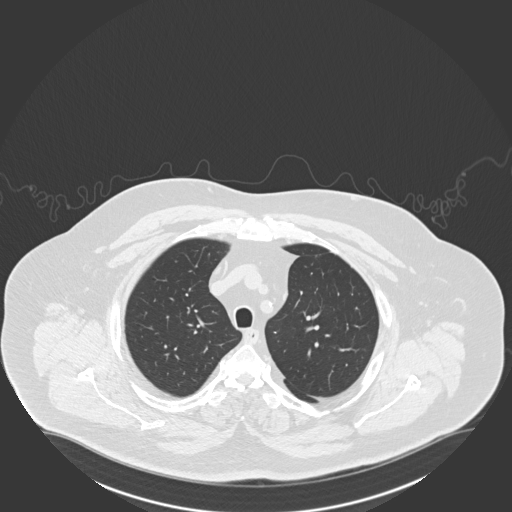
[im 146/172  lung]
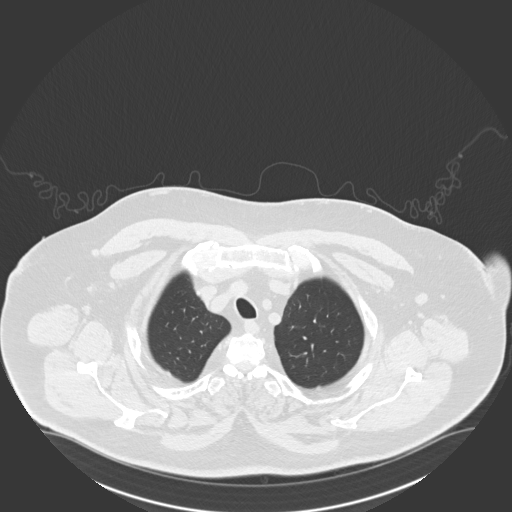
[im 159/172  lung]
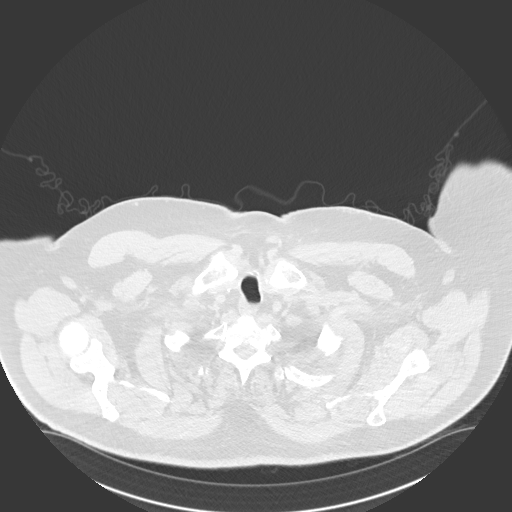

[Series 5: coronal · coronal · 0.69mm/px · 3 of 167 slices shown]
[im 34/167  lung]
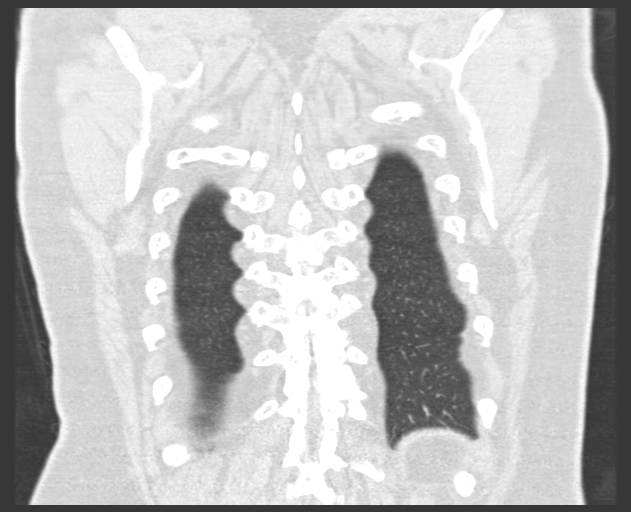
[im 67/167  lung]
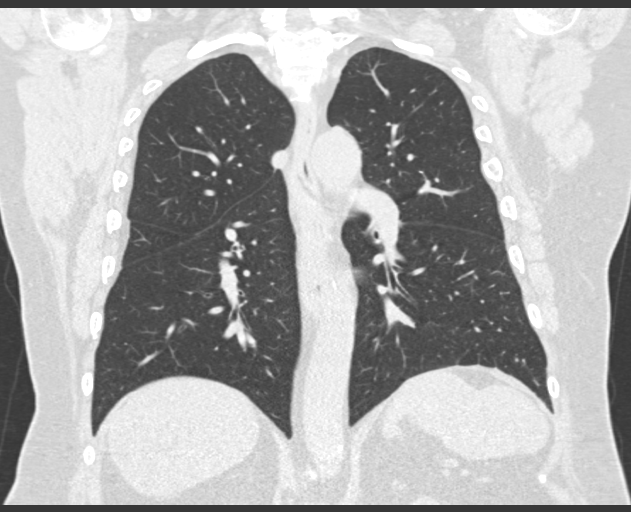
[im 100/167  lung]
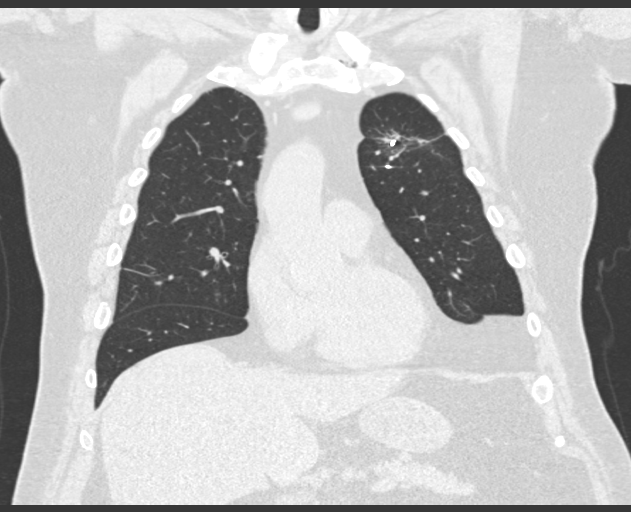

[15 of 36 positions shown; findings below may reference images not displayed]

FINDINGS: Cardiovascular: Aortic atherosclerosis. Normal heart size. No
pericardial effusion.

Mediastinum/Nodes: No enlarged mediastinal, hilar, or axillary lymph
nodes. Thyroid gland, trachea, and esophagus demonstrate no
significant findings.

Lungs/Pleura: Unchanged appearance of predominantly bandlike post
radiation fibrosis and adjacent ground-glass about fiducial markers
of the anterior left upper lobe (series 3, image 48). Bandlike
scarring of the dependent left lung base. No pleural effusion or
pneumothorax.

Upper Abdomen: No acute abnormality.

Musculoskeletal: No chest wall mass or suspicious bone lesions
identified. Status post vertebral cement augmentation of T11 and
T12.
IMPRESSION: 1. Unchanged appearance of predominantly bandlike post radiation
fibrosis and adjacent ground-glass about fiducial markers of the
anterior left upper lobe. No evidence of recurrent or metastatic
disease in the chest.
2. Status post interval vertebral cement augmentation of T11 and
T12.

Aortic Atherosclerosis ([VJ]-[VJ]).

## 2021-10-16 NOTE — Progress Notes (Signed)
[name]  Timothy Conway [id number] 01/15/1956 65 y.o. 10/16/2021   Principle Diagnosis:  Stage Ia (T1N0M0) adenocarcinoma of the left upper lung  Current Therapy:   Status post radiosurgery-5400 rad that was completed in 08/20/2020     Interim History:  Timothy Conway is back for follow-up.  We last saw him back in July.  Since then, has been doing quite well.  This morning, he woke up and his left knee was bothering him.  He did not fall.  Not twisted.  Sounds like he may have little bit of bursitis.  I told him to try some over-the-counter Voltaren cream.  He had a CT scan done today.  Unfortunately, we do not have the results back yet.  Not complain of any increased cough.  There is no shortness of breath.  He has had no nausea or vomiting.  There is no rashes.  He has had no leg swelling.  He has had no change in bowel or bladder habits.  Overall, I would say his performance status is ECOG 1.    Medications:  Current Outpatient Medications:    alendronate (FOSAMAX) 70 MG tablet, Take 1 tablet (70 mg total) by mouth every 7 (seven) days. Take with a full glass of water on an empty stomach., Disp: 12 tablet, Rfl: 3   alprazolam (XANAX) 2 MG tablet, Take 2 mg by mouth 3 (three) times daily as needed (anxiety.). Pt taking 1/2 to 1 pill three times a day as needed., Disp: , Rfl:    ARIPiprazole (ABILIFY) 20 MG tablet, Take 20 mg by mouth at bedtime. , Disp: , Rfl:    aspirin EC 81 MG tablet, Take 81 mg by mouth daily. Swallow whole., Disp: , Rfl:    atenolol (TENORMIN) 100 MG tablet, Take 100 mg by mouth 2 (two) times daily., Disp: , Rfl:    CALCIUM PO, Take 1 tablet by mouth daily., Disp: , Rfl:    Cholecalciferol (VITAMIN D) 2000 units tablet, Take 2,000 Units by mouth daily., Disp: , Rfl:    fenofibrate 160 MG tablet, Take 160 mg by mouth daily with supper. , Disp: , Rfl:    Fluticasone-Salmeterol (ADVAIR) 250-50 MCG/DOSE AEPB, , Disp: , Rfl:     hydrocortisone 2.5 % cream, Apply 1 application topically 2 (two) times daily as needed., Disp: , Rfl:    levothyroxine (SYNTHROID) 137 MCG tablet, Take 1 tablet (137 mcg total) by mouth daily before breakfast., Disp: 45 tablet, Rfl: 3   Multiple Vitamin (MULTIVITAMIN WITH MINERALS) TABS tablet, Take 1 tablet by mouth daily., Disp: , Rfl:    omeprazole (PRILOSEC) 20 MG capsule, Take 20 mg by mouth daily before breakfast. , Disp: , Rfl:    rosuvastatin (CRESTOR) 10 MG tablet, Take 10 mg by mouth daily. , Disp: , Rfl:    albuterol (VENTOLIN HFA) 108 (90 Base) MCG/ACT inhaler, INHALE 2 PUFFS INTO THE LUNGS EVERY 6 HOURS AS NEEDED FOR WHEEZING OR SHORTNESS OF BREATH (Patient not taking: Reported on 10/16/2021), Disp: 18 g, Rfl: 3  Allergies: No Known Allergies  Past Medical History, Surgical history, Social history, and Family History were reviewed and updated.  Review of Systems: Review of Systems  Constitutional: Negative.   HENT:  Negative.    Eyes: Negative.   Respiratory: Negative.    Cardiovascular: Negative.   Gastrointestinal: Negative.  Negative for abdominal distention.  Endocrine: Negative.   Genitourinary: Negative.    Musculoskeletal:  Positive for gait problem.  Skin: Negative.   Neurological:  Positive for gait problem.  Hematological: Negative.   Psychiatric/Behavioral: Negative.     Physical Exam:  weight is 271 lb (122.9 kg). His oral temperature is 99.5 F (37.5 C). His blood pressure is 125/91 (abnormal) and his pulse is 78. His respiration is 20 and oxygen saturation is 97%.   Wt Readings from Last 3 Encounters:  10/16/21 271 lb (122.9 kg)  09/11/21 273 lb 9.6 oz (124.1 kg)  06/25/21 269 lb (122 kg)    Physical Exam Vitals reviewed.  HENT:     Head: Normocephalic and atraumatic.  Eyes:     Pupils: Pupils are equal, round, and reactive to light.  Cardiovascular:     Rate and Rhythm: Normal rate and regular rhythm.     Heart sounds: Normal heart sounds.   Pulmonary:     Effort: Pulmonary effort is normal.     Breath sounds: Normal breath sounds.  Abdominal:     General: Bowel sounds are normal.     Palpations: Abdomen is soft.  Musculoskeletal:        General: No tenderness or deformity. Normal range of motion.     Cervical back: Normal range of motion.  Lymphadenopathy:     Cervical: No cervical adenopathy.  Skin:    General: Skin is warm and dry.     Findings: No erythema or rash.  Neurological:     Mental Status: He is alert and oriented to person, place, and time.  Psychiatric:        Behavior: Behavior normal.        Thought Content: Thought content normal.        Judgment: Judgment normal.   Lab Results  Component Value Date   WBC 6.9 10/16/2021   HGB 15.5 10/16/2021   HCT 47.3 10/16/2021   MCV 93.5 10/16/2021   PLT 236 10/16/2021     Chemistry      Component Value Date/Time   NA 141 10/16/2021 1135   K 4.2 10/16/2021 1135   CL 104 10/16/2021 1135   CO2 30 10/16/2021 1135   BUN 13 10/16/2021 1135   CREATININE 1.11 10/16/2021 1135      Component Value Date/Time   CALCIUM 8.8 (L) 10/16/2021 1135   ALKPHOS 35 (L) 10/16/2021 1135   AST 20 10/16/2021 1135   ALT 13 10/16/2021 1135   BILITOT 0.6 10/16/2021 1135      Impression and Plan: Timothy Conway is a very nice 65 year old white male.  He had a non small cell lung cancer of the left upper lung.  This was an adenocarcinoma.  It was a low-grade adenocarcinoma based on the low level of activity on the PET scan.   We will have to see what the CT scan result shows.  I hate the fact that is not back yet.  I feel confident that we can probably go 6 months.  This will get him through the holidays and wintertime.   Timothy Napoleon, MD  11/17/202212:24 PM

## 2021-10-17 ENCOUNTER — Telehealth: Payer: Self-pay | Admitting: Hematology & Oncology

## 2021-10-17 ENCOUNTER — Telehealth: Payer: Self-pay | Admitting: *Deleted

## 2021-10-17 NOTE — Telephone Encounter (Signed)
-----   Message from [name], MD sent at 10/17/2021 12:09 PM EST ----- Please call and tell him that the CT scan does not show any evidence of cancer recurrence.  [name]

## 2021-10-17 NOTE — Telephone Encounter (Signed)
As noted below by Dr. Marin Olp, I informed the patient that the CT scan did not show any evidence of cancer recurrence. He verbalized understanding.

## 2021-10-28 ENCOUNTER — Other Ambulatory Visit: Payer: Self-pay

## 2021-10-28 ENCOUNTER — Other Ambulatory Visit (INDEPENDENT_AMBULATORY_CARE_PROVIDER_SITE_OTHER): Payer: Medicare HMO

## 2021-10-28 DIAGNOSIS — E890 Postprocedural hypothyroidism: Secondary | ICD-10-CM | POA: Diagnosis not present

## 2021-10-28 LAB — T4, FREE: Free T4: 0.89 ng/dL (ref 0.60–1.60)

## 2021-10-28 LAB — TSH: TSH: 0.19 u[IU]/mL — ABNORMAL LOW (ref 0.35–5.50)

## 2021-10-28 MED ORDER — LEVOTHYROXINE SODIUM 125 MCG PO TABS: 125.0000 ug | ORAL_TABLET | Freq: Every day | ORAL | 3 refills | Status: DC

## 2021-10-29 DIAGNOSIS — F321 Major depressive disorder, single episode, moderate: Secondary | ICD-10-CM | POA: Diagnosis not present

## 2021-10-29 DIAGNOSIS — I10 Essential (primary) hypertension: Secondary | ICD-10-CM | POA: Diagnosis not present

## 2021-10-29 DIAGNOSIS — E039 Hypothyroidism, unspecified: Secondary | ICD-10-CM | POA: Diagnosis not present

## 2021-10-29 DIAGNOSIS — F329 Major depressive disorder, single episode, unspecified: Secondary | ICD-10-CM | POA: Diagnosis not present

## 2021-10-29 DIAGNOSIS — E7800 Pure hypercholesterolemia, unspecified: Secondary | ICD-10-CM | POA: Diagnosis not present

## 2021-10-29 DIAGNOSIS — J449 Chronic obstructive pulmonary disease, unspecified: Secondary | ICD-10-CM | POA: Diagnosis not present

## 2021-11-14 DIAGNOSIS — F321 Major depressive disorder, single episode, moderate: Secondary | ICD-10-CM | POA: Diagnosis not present

## 2021-11-14 DIAGNOSIS — I10 Essential (primary) hypertension: Secondary | ICD-10-CM | POA: Diagnosis not present

## 2021-11-14 DIAGNOSIS — Z85850 Personal history of malignant neoplasm of thyroid: Secondary | ICD-10-CM | POA: Diagnosis not present

## 2021-11-14 DIAGNOSIS — I719 Aortic aneurysm of unspecified site, without rupture: Secondary | ICD-10-CM | POA: Diagnosis not present

## 2021-11-14 DIAGNOSIS — E039 Hypothyroidism, unspecified: Secondary | ICD-10-CM | POA: Diagnosis not present

## 2021-11-14 DIAGNOSIS — E7800 Pure hypercholesterolemia, unspecified: Secondary | ICD-10-CM | POA: Diagnosis not present

## 2021-11-14 DIAGNOSIS — C3492 Malignant neoplasm of unspecified part of left bronchus or lung: Secondary | ICD-10-CM | POA: Diagnosis not present

## 2021-11-14 DIAGNOSIS — Z8719 Personal history of other diseases of the digestive system: Secondary | ICD-10-CM | POA: Diagnosis not present

## 2021-12-10 ENCOUNTER — Encounter: Payer: Self-pay | Admitting: Internal Medicine

## 2021-12-12 ENCOUNTER — Other Ambulatory Visit: Payer: Self-pay

## 2021-12-12 ENCOUNTER — Encounter: Payer: Self-pay | Admitting: Internal Medicine

## 2021-12-12 ENCOUNTER — Ambulatory Visit: Payer: Medicare HMO | Admitting: Internal Medicine

## 2021-12-12 VITALS — BP 128/72 | HR 68 | Ht 72.0 in | Wt 281.6 lb

## 2021-12-12 DIAGNOSIS — M810 Age-related osteoporosis without current pathological fracture: Secondary | ICD-10-CM

## 2021-12-12 DIAGNOSIS — C73 Malignant neoplasm of thyroid gland: Secondary | ICD-10-CM

## 2021-12-12 DIAGNOSIS — E890 Postprocedural hypothyroidism: Secondary | ICD-10-CM

## 2021-12-12 HISTORY — DX: Age-related osteoporosis without current pathological fracture: M81.0

## 2021-12-12 LAB — T4, FREE: Free T4: 1.1 ng/dL (ref 0.60–1.60)

## 2021-12-12 LAB — TSH: TSH: 0.93 u[IU]/mL (ref 0.35–5.50)

## 2021-12-12 NOTE — Patient Instructions (Signed)
Patient Instructions  Please continue Synthroid 125 mcg daily.  Take the thyroid hormone every day, with water, at least 30 minutes before breakfast, separated by at least 4 hours from: - acid reflux medications - calcium - iron - multivitamins  Please stop at the lab.  Continue Fosamax 70 mg weekly.  Please come back for a follow-up appointment in 6 months.

## 2021-12-12 NOTE — Progress Notes (Signed)
Patient ID: Timothy Conway, male   DOB: 15-Feb-1956, 66 y.o.   MRN: [id number]   This visit occurred during the SARS-CoV-2 public health emergency.  Safety protocols were in place, including screening questions prior to the visit, additional usage of staff PPE, and extensive cleaning of exam room while observing appropriate contact time as indicated for disinfecting solutions.   HPI  Timothy Conway is a 66 y.o.-year-old male, initially referred by Dr. Harlow Asa, presenting for follow-up for papillary thyroid cancer and postsurgical hypothyroidism.  Last visit 2 months ago.  Interim history: In 2021, he was diagnosed with 11 mm left adenocarcinoma. He had radiotactic RxTx.  CT scan in 2021 showed a decrease in size of his tumor.  He continues to be followed by oncology. Before last visit, he fell onto his tailbone and fractured 1 vertebra.  He had kyphoplasty and then had another compression fracture.  His back pain improved since then.   We started him on Fosamax for clinical osteoporosis.  He tolerates this well, without jaw/hip/thigh pain.  Has ~8 lbs weight gain and more knee pain - bilateral. He has COPD - more SOB, waking up at night gasping for air. He complains of gaining weight after he reduced his levothyroxine dose.  ThyCA: Reviewed and addended his thyroid cancer history: Pt. has been found to have a hypermetabolic thyroid nodule on a PET scan (07/13/2017) obtained by Dr. Lamonte Sakai, his pulmonologist, to investigate a lung nodule.  A thyroid U/S (07/30/2017) showed a L lobe 1.3 x 1.0 x 1.2 cm nodule, solid, hypoechoic, lobulated/irregular, with peripheral calcifications. Bx of this nodule (07/30/2017): PTC  Total thyroidectomy (08/27/2017) by Dr. Harlow Asa: Diagnosis 1. Thyroid, thyroidectomy, total - PAPILLARY THYROID CARCINOMA, CLASSIC TYPE, SPANNING 1.4 CM. - NO EXTRATHYROIDAL EXTENSION. - RESECTION MARGINS ARE NEGATIVE. - SEE ONCOLOGY TABLE. 2. Lymph node, biopsy, central  compartment - THREE OF THREE LYMPH NODES NEGATIVE FOR CARCINOMA (0/3). - ONE BENIGN PARATHYROID. Microscopic Comment 1. THYROID Specimen: Total thyroid with central compartment lymph nodes. Procedure (including lymph node sampling if applicable): Total thyroidectomy with central lymph node biopsy. Specimen Integrity (intact/fragmented): Intact. Tumor focality: Unifocal. Dominant tumor: Maximum tumor size (cm): 1.4 cm. Tumor laterality: Left. Histologic type (including subtype and/or unique features as applicable): Papillary thyroid carcinoma, classic type. Tumor capsule: Present. Extrathyroidal extension: Absent. Capsular invasion with degree of invasion if present: N/A. Margins: Negative. Lymphatic or vascular invasion: Not identified. Lymph nodes: # examined 3; # positive; 0 Extracapsular extension (if applicable): N/A. TNM code: pT1b, pN0 Non-neoplastic thyroid: Largely unremarkable. Comments: There tumor is largely fibrotic and calcified with only a small portion of viable tumor.  Neck U/S (09/25/2019): The thyroid gland is surgically absent. Evaluation of the left and right resection bed demonstrates no evidence of residual thyroid tissue or nodularity. No suspicious lymphadenopathy is identified. IMPRESSION: Surgical changes of prior total thyroidectomy without evidence of residual or recurrent thyroid tissue, nodularity or lymphadenopathy.  PET/CT (09/15/2020): Increased uptake in the base of the tongue and anterior paravertebral muscles  CT neck (07/24/2020): Increased uptake is most likely related to tonsillar inflammation  CT chest (11/14/2020) without contrast: No suspicious masses in mediastinum or neck.  CT chest (06/24/2021): Mediastinum/Nodes: No enlarged mediastinal, hilar, or axillary lymph nodes. Thyroid gland, trachea, and esophagus demonstrate no significant findings.  CT chest (10/16/2021): Unchanged appearance of predominantly bandlike post  radiation fibrosis and adjacent ground-glass about fiducial markers of the anterior left upper lobe. No evidence of recurrent or metastatic disease in the chest.  Thyroglobulin is detectable: Lab Results  Component Value Date   THYROGLB 0.3 (L) 09/11/2021   THYROGLB 0.1 (L) 10/29/2020   THYROGLB 0.7 (L) 09/17/2020   THYROGLB 0.1 (L) 09/14/2019   THYROGLB 0.2 (L) 09/08/2018   THYROGLB 0.1 (L) 03/23/2018   His antithyroid antibodies are undetectable: Lab Results  Component Value Date   THGAB <1 09/11/2021   THGAB <1 10/29/2020   THGAB <1 09/17/2020   THGAB <1 09/14/2019   THGAB <1 09/08/2018   THGAB <1 03/23/2018   Postsurgical hypothyroidism  He is on Levothyroxine 125 mcg daily (last dose decrease 09/2021): - in am - fasting - at least 30 min from b'fast (however, he usually skips breakfast) - + calcium 1x a day with dinner - no iron - + multivitamins at night - + PPIs at night (Omeprazole) - not on Biotin + Magnesium and Zinc - at night.  Reviewed his TFTs: Lab Results  Component Value Date   TSH 0.19 (L) 10/28/2021   TSH 0.15 (L) 09/11/2021   TSH 0.76 12/25/2020   TSH 0.28 (L) 10/29/2020   TSH 12.27 (H) 09/17/2020   TSH 0.64 09/14/2019   TSH 4.66 (H) 09/08/2018   TSH 0.52 03/23/2018   TSH 12.75 (H) 02/11/2018   TSH 2.01 11/12/2017   FREET4 0.89 10/28/2021   FREET4 1.17 09/11/2021   FREET4 1.19 12/25/2020   FREET4 1.13 10/29/2020   FREET4 0.77 09/17/2020   FREET4 1.04 09/14/2019   FREET4 0.73 09/08/2018   FREET4 0.87 03/23/2018   FREET4 0.62 02/11/2018   FREET4 0.70 11/12/2017    Pt denies: - feeling nodules in neck - hoarseness - dysphagia - choking - SOB with lying down  Unknown family history of thyroid disease or thyroid cancer since he was adopted. No h/o radiation tx to head or neck.  No herbal supplements. No Biotin use. No recent steroids use.   Clinical osteoporosis:  He was found to have a vertebral fracture after fall from level  ground [place].  MRI lumbar spine (08/09/2021): 1. Compression fracture of the T11 vertebral body status post vertebral augmentation with persistent marrow edema. 2. Minor compression fracture of the superior endplate of G50 on the right side with persistent marrow edema without significant height loss.  He had a bone density scan several years ago in [facility] reportedly showing osteopenia.  A repeat bone density scan obtained since last visit showed osteopenia:  DXA: 09/16/2021 ([place]) Lumbar spine L1-L4 Femoral neck (FN)  T-score  1.2 RFN: -1.0 LFN: -1.2    We started Fosamax 70 mg weekly in 08/2021.  No other fractures as an adult, but had a wrist fracture as a child.  He has a history of vitamin D deficiency, but latest level was normal: Lab Results  Component Value Date   VD25OH 60.58 09/11/2021   He was advised by PCP to start 2000 units vitamin D daily 2-3 years ago >> he started 5000 units daily as he could not find a 2000 unit dose.  He continues on this dose now.  ROS: + See HPI Neurological: + tremors/no numbness/no tingling/no dizziness  I reviewed pt's medications, allergies, PMH, social hx, family hx, and changes were documented in the history of present illness. Otherwise, unchanged from my initial visit note.  Past Medical History:  Diagnosis Date   Anxiety    COPD (chronic obstructive pulmonary disease) (HCC)    Depression    Dry eye syndrome    Dysrhythmia    hx  of PAC's   GERD (gastroesophageal reflux disease)    Gout    Hyperlipidemia    Hypertension    Hypothyroidism    Thyroid cancer ([place])    2018   Past Surgical History:  Procedure Laterality Date   ABDOMINAL SURGERY     hemmoraging ulcer   BRONCHIAL BIOPSY  06/25/2020   Procedure: BRONCHIAL BIOPSIES;  Surgeon: Collene Gobble, MD;  Location: [facility] ENDOSCOPY;  Service: Pulmonary;;   BRONCHIAL BRUSHINGS  06/25/2020   Procedure: BRONCHIAL BRUSHINGS;  Surgeon: Collene Gobble, MD;   Location: [facility] ENDOSCOPY;  Service: Pulmonary;;   BRONCHIAL NEEDLE ASPIRATION BIOPSY  06/25/2020   Procedure: BRONCHIAL NEEDLE ASPIRATION BIOPSIES;  Surgeon: Collene Gobble, MD;  Location: [place];  Service: Pulmonary;;   BRONCHIAL WASHINGS  06/25/2020   Procedure: BRONCHIAL WASHINGS;  Surgeon: Collene Gobble, MD;  Location: [facility] ENDOSCOPY;  Service: Pulmonary;;   CATARACT EXTRACTION Right    COLONOSCOPY     EYE SURGERY Right    x 4  between ages 28-13   FIDUCIAL MARKER PLACEMENT  06/25/2020   Procedure: FIDUCIAL MARKER PLACEMENT;  Surgeon: Collene Gobble, MD;  Location: [facility] ENDOSCOPY;  Service: Pulmonary;;   THYROIDECTOMY N/A 08/27/2017   Procedure: TOTAL THYROIDECTOMY WITH LIMITED LYMPH NODE DISSECTION;  Surgeon: Armandina Gemma, MD;  Location: [facility] ORS;  Service: General;  Laterality: N/A;   UPPER GI ENDOSCOPY     VIDEO BRONCHOSCOPY WITH ENDOBRONCHIAL NAVIGATION N/A 06/25/2020   Procedure: VIDEO BRONCHOSCOPY WITH ENDOBRONCHIAL NAVIGATION;  Surgeon: Collene Gobble, MD;  Location: [place] ENDOSCOPY;  Service: Pulmonary;  Laterality: N/A;   Social History   Social History   Marital status: Single    Spouse name: N/A   Number of children: 0   Occupational History   n/a   Social History Main Topics   Smoking status: Current Every Day Smoker    Packs/day: 1.00    Years: 40.00    Types: Cigarettes   Smokeless tobacco: Never Used     Comment: 1 1/2 per day   Alcohol use 0.6 oz/week    3-4 Cans of beer per day   Drug use: No   Current Outpatient Medications on File Prior to Visit  Medication Sig Dispense Refill   albuterol (VENTOLIN HFA) 108 (90 Base) MCG/ACT inhaler INHALE 2 PUFFS INTO THE LUNGS EVERY 6 HOURS AS NEEDED FOR WHEEZING OR SHORTNESS OF BREATH (Patient not taking: Reported on 10/16/2021) 18 g 3   alendronate (FOSAMAX) 70 MG tablet Take 1 tablet (70 mg total) by mouth every 7 (seven) days. Take with a full glass of water on an empty stomach. 12 tablet 3   alprazolam (XANAX) 2 MG  tablet Take 2 mg by mouth 3 (three) times daily as needed (anxiety.). Pt taking 1/2 to 1 pill three times a day as needed.     ARIPiprazole (ABILIFY) 20 MG tablet Take 20 mg by mouth at bedtime.      aspirin EC 81 MG tablet Take 81 mg by mouth daily. Swallow whole.     atenolol (TENORMIN) 100 MG tablet Take 100 mg by mouth 2 (two) times daily.     CALCIUM PO Take 1 tablet by mouth daily.     Cholecalciferol (VITAMIN D) 2000 units tablet Take 2,000 Units by mouth daily.     fenofibrate 160 MG tablet Take 160 mg by mouth daily with supper.      Fluticasone-Salmeterol (ADVAIR) 250-50 MCG/DOSE AEPB      hydrocortisone 2.5 %  cream Apply 1 application topically 2 (two) times daily as needed.     levothyroxine (SYNTHROID) 125 MCG tablet Take 1 tablet (125 mcg total) by mouth daily. 45 tablet 3   Multiple Vitamin (MULTIVITAMIN WITH MINERALS) TABS tablet Take 1 tablet by mouth daily.     omeprazole (PRILOSEC) 20 MG capsule Take 20 mg by mouth daily before breakfast.      rosuvastatin (CRESTOR) 10 MG tablet Take 10 mg by mouth daily.      No current facility-administered medications on file prior to visit.   No Known Allergies Family History  Adopted: Yes   PE: There were no vitals taken for this visit. Wt Readings from Last 3 Encounters:  10/16/21 271 lb (122.9 kg)  09/11/21 273 lb 9.6 oz (124.1 kg)  06/25/21 269 lb (122 kg)   Constitutional: overweight, in NAD Eyes: PERRLA, EOMI, no exophthalmos ENT: moist mucous membranes, no neck masses palpated, no cervical lymphadenopathy Cardiovascular: RRR, No MRG Respiratory: CTA B Musculoskeletal: no deformities, strength intact in all 4 Skin: moist, warm, no rashes Neurological: + tremor with outstretched hands, DTR normal in all 4  ASSESSMENT: 1. Thyroid cancer - see HPI  2. Postsurgical Hypothyroidism  3. Vertebral fracture  PLAN:  1. Papillary Thyroid Cancer -Patient with history of classic type papillary thyroid cancer, not very  large, encapsulated, without extrathyroidal extension, without lymphovascular invasion and negative lymph node biopsies.  Therefore, his cancer was considered low risk and we decided to continue without RAI treatment for the 2015 ATA thyroid cancer guidelines.  We are following with thyroglobulin tumor marker and also with neck ultrasounds.  We did discuss that even if he ends up needing RAI treatment as a salvage therapy in the future, his prognosis would likely not be changed. -Latest neck ultrasound did not show any metastasis or recurrence in neck.  Also, PET scan and CT scans obtained during his lung cancer staging did not show thyroid cancer recurrence or metastasis. -If thyroglobulin level remains detectable, expected, since he did not have RAI treatment.  At last visit, thyroglobulin was slightly higher, at 0.3.  ATA antibodies were undetectable -We will continue to follow his thyroglobulin levels.  Plan to recheck them at next visit. -I will see him back in 6 months  2. Patient with history of total thyroidectomy for thyroid cancer, now with iatrogenic hypothyroidism, on Synthroid therapy.  He had fatigue in the past which improved after increasing his dose of thyroid hormone. - latest thyroid labs reviewed with pt. >> TSH was still suppressed: Lab Results  Component Value Date   TSH 0.19 (L) 10/28/2021  - he continues on LT4 125 mcg daily, dose decreased 09/2021 - pt feels he gained weight after we reduced his dose of LT4, so he would be interested in an increase in dose if possible - we discussed about taking the thyroid hormone every day, with water, >30 minutes before breakfast, separated by >4 hours from acid reflux medications, calcium, iron, multivitamins. Pt. is taking it correctly. - will check thyroid tests today: TSH and fT4 - If labs are abnormal, he will need to return for repeat TFTs in 1.5 months  3.  Clinical osteoporosis -Before last visit, he had 2 vertebral fractures, as  a consequence of a fall from level ground, qualifying as a fragility fracture.  This puts him into the osteoporotic category -He had kyphoplasty  -Back pain has improved after this procedure -At last visit, we checked his vitamin D and this was  excellent.  He is taking 5000 units vitamin D daily, which we will continue -We checked a bone density scan and this showed osteopenia, however, due to the fragility fractures, he qualifies for clinical osteoporosis. -Regarding treatment, he has a contraindication to Tymlos or Forteo due to previous history of radiation therapy. -We ended up starting Fosamax 70 mg weekly after last visit.  He tolerates this well, without jaw/hip/thigh pain.  He is taking it correctly, 30 minutes before levothyroxine with a full glass of water. -Plan to continue this for now for at least 5 years  Component     Latest Ref Rng & Units 12/12/2021  TSH     0.35 - 5.50 uIU/mL 0.93  T4,Free(Direct)     0.60 - 1.60 ng/dL 1.10  TFTs are now at goal.  We will continue the same dose of levothyroxine.   Philemon Kingdom, MD PhD [facility] Endocrinology

## 2021-12-15 DIAGNOSIS — E7800 Pure hypercholesterolemia, unspecified: Secondary | ICD-10-CM | POA: Diagnosis not present

## 2021-12-15 DIAGNOSIS — J449 Chronic obstructive pulmonary disease, unspecified: Secondary | ICD-10-CM | POA: Diagnosis not present

## 2021-12-15 DIAGNOSIS — E039 Hypothyroidism, unspecified: Secondary | ICD-10-CM | POA: Diagnosis not present

## 2021-12-15 DIAGNOSIS — I10 Essential (primary) hypertension: Secondary | ICD-10-CM | POA: Diagnosis not present

## 2022-01-09 ENCOUNTER — Other Ambulatory Visit: Payer: Self-pay | Admitting: Emergency Medicine

## 2022-01-24 ENCOUNTER — Other Ambulatory Visit: Payer: Self-pay | Admitting: Emergency Medicine

## 2022-02-13 DIAGNOSIS — I10 Essential (primary) hypertension: Secondary | ICD-10-CM | POA: Diagnosis not present

## 2022-02-13 DIAGNOSIS — E039 Hypothyroidism, unspecified: Secondary | ICD-10-CM | POA: Diagnosis not present

## 2022-02-13 DIAGNOSIS — E7800 Pure hypercholesterolemia, unspecified: Secondary | ICD-10-CM | POA: Diagnosis not present

## 2022-02-20 ENCOUNTER — Other Ambulatory Visit: Payer: Self-pay | Admitting: Emergency Medicine

## 2022-02-26 ENCOUNTER — Other Ambulatory Visit: Payer: Self-pay

## 2022-02-26 DIAGNOSIS — C73 Malignant neoplasm of thyroid gland: Secondary | ICD-10-CM

## 2022-02-26 MED ORDER — LEVOTHYROXINE SODIUM 125 MCG PO TABS: 125.0000 ug | ORAL_TABLET | Freq: Every day | ORAL | 1 refills | Status: DC

## 2022-02-26 NOTE — Telephone Encounter (Signed)
Pt called to request refill be sent to [place] mail order pharmacy  ?

## 2022-03-12 ENCOUNTER — Ambulatory Visit: Payer: Medicare HMO | Admitting: Internal Medicine

## 2022-03-19 ENCOUNTER — Other Ambulatory Visit: Payer: Self-pay

## 2022-03-19 DIAGNOSIS — C73 Malignant neoplasm of thyroid gland: Secondary | ICD-10-CM

## 2022-03-19 MED ORDER — ALENDRONATE SODIUM 70 MG PO TABS: 70.0000 mg | ORAL_TABLET | ORAL | 3 refills | Status: DC

## 2022-03-28 ENCOUNTER — Other Ambulatory Visit: Payer: Self-pay | Admitting: Emergency Medicine

## 2022-03-31 ENCOUNTER — Telehealth: Payer: Self-pay | Admitting: Emergency Medicine

## 2022-03-31 DIAGNOSIS — E039 Hypothyroidism, unspecified: Secondary | ICD-10-CM | POA: Diagnosis not present

## 2022-03-31 DIAGNOSIS — F321 Major depressive disorder, single episode, moderate: Secondary | ICD-10-CM | POA: Diagnosis not present

## 2022-03-31 DIAGNOSIS — J449 Chronic obstructive pulmonary disease, unspecified: Secondary | ICD-10-CM | POA: Diagnosis not present

## 2022-03-31 DIAGNOSIS — M810 Age-related osteoporosis without current pathological fracture: Secondary | ICD-10-CM | POA: Diagnosis not present

## 2022-03-31 DIAGNOSIS — E7800 Pure hypercholesterolemia, unspecified: Secondary | ICD-10-CM | POA: Diagnosis not present

## 2022-03-31 DIAGNOSIS — I10 Essential (primary) hypertension: Secondary | ICD-10-CM | POA: Diagnosis not present

## 2022-03-31 NOTE — Telephone Encounter (Signed)
Please call to make a follow up with Dr. Lamonte Sakai first available.  ?

## 2022-04-03 NOTE — Telephone Encounter (Signed)
Patient scheduled 05/14/2022 at 11:45am with Dr. Lamonte Sakai- appointment reminder mailed. Nothing further needed. ?

## 2022-04-15 ENCOUNTER — Encounter: Payer: Self-pay | Admitting: Hematology & Oncology

## 2022-04-15 ENCOUNTER — Ambulatory Visit (HOSPITAL_BASED_OUTPATIENT_CLINIC_OR_DEPARTMENT_OTHER)
Admission: RE | Admit: 2022-04-15 | Discharge: 2022-04-15 | Disposition: A | Discharge status: Home / Self Care | Payer: Medicare HMO | Source: Ambulatory Visit | Attending: Hematology & Oncology | Admitting: Hematology & Oncology

## 2022-04-15 ENCOUNTER — Inpatient Hospital Stay: Payer: Medicare HMO | Admitting: Hematology & Oncology

## 2022-04-15 ENCOUNTER — Inpatient Hospital Stay: Payer: Medicare HMO | Attending: Hematology & Oncology

## 2022-04-15 ENCOUNTER — Other Ambulatory Visit: Payer: Self-pay

## 2022-04-15 VITALS — BP 122/64 | HR 61 | Temp 98.4°F | Resp 20 | Ht 72.0 in | Wt 278.0 lb

## 2022-04-15 DIAGNOSIS — C3492 Malignant neoplasm of unspecified part of left bronchus or lung: Secondary | ICD-10-CM

## 2022-04-15 DIAGNOSIS — I700 Atherosclerosis of aorta: Secondary | ICD-10-CM | POA: Diagnosis not present

## 2022-04-15 DIAGNOSIS — C3412 Malignant neoplasm of upper lobe, left bronchus or lung: Secondary | ICD-10-CM | POA: Diagnosis not present

## 2022-04-15 DIAGNOSIS — M7989 Other specified soft tissue disorders: Secondary | ICD-10-CM | POA: Diagnosis not present

## 2022-04-15 LAB — CMP (CANCER CENTER ONLY)
ALT: 11 U/L (ref 0–44)
AST: 16 U/L (ref 15–41)
Albumin: 4.5 g/dL (ref 3.5–5.0)
Alkaline Phosphatase: 28 U/L — ABNORMAL LOW (ref 38–126)
Anion gap: 7 (ref 5–15)
BUN: 16 mg/dL (ref 8–23)
CO2: 31 mmol/L (ref 22–32)
Calcium: 8.5 mg/dL — ABNORMAL LOW (ref 8.9–10.3)
Chloride: 105 mmol/L (ref 98–111)
Creatinine: 1.22 mg/dL (ref 0.61–1.24)
GFR, Estimated: >60 mL/min (ref >60)
Glucose, Bld: 103 mg/dL — ABNORMAL HIGH (ref 70–99)
Potassium: 4.6 mmol/L (ref 3.5–5.1)
Sodium: 143 mmol/L (ref 135–145)
Total Bilirubin: 0.6 mg/dL (ref 0.3–1.2)
Total Protein: 7.3 g/dL (ref 6.5–8.1)

## 2022-04-15 LAB — CBC WITH DIFFERENTIAL (CANCER CENTER ONLY)
Abs Immature Granulocytes: 0.03 10*3/uL (ref 0.00–0.07)
Basophils Absolute: 0.1 10*3/uL (ref 0.0–0.1)
Basophils Relative: 1 %
Eosinophils Absolute: 0.1 10*3/uL (ref 0.0–0.5)
Eosinophils Relative: 2 %
HCT: 53.8 % — ABNORMAL HIGH (ref 39.0–52.0)
Hemoglobin: 17 g/dL (ref 13.0–17.0)
Immature Granulocytes: 1 %
Lymphocytes Relative: 26 %
Lymphs Abs: 1.7 10*3/uL (ref 0.7–4.0)
MCH: 30.4 pg (ref 26.0–34.0)
MCHC: 31.6 g/dL (ref 30.0–36.0)
MCV: 96.2 fL (ref 80.0–100.0)
Monocytes Absolute: 0.4 10*3/uL (ref 0.1–1.0)
Monocytes Relative: 5 %
Neutro Abs: 4.4 10*3/uL (ref 1.7–7.7)
Neutrophils Relative %: 65 %
Platelet Count: 225 10*3/uL (ref 150–400)
RBC: 5.59 MIL/uL (ref 4.22–5.81)
RDW: 12.5 % (ref 11.5–15.5)
WBC Count: 6.7 10*3/uL (ref 4.0–10.5)
nRBC: 0 % (ref 0.0–0.2)

## 2022-04-15 IMAGING — CT CT CHEST W/O CM
2 of 3 series · 15 of 36 positions shown, 18 images · non-contrast
Comparison: Chest CT [DATE].

CLINICAL DATA: 65-year-old male with history of non-small cell lung
cancer status post SBRT. Follow-up study.



[Series 2: thorax · axial · 0.87mm/px · z∈[-364,-66]mm · 12 of 175 slices shown, 15 images]
[im 13/175  mediastinal]
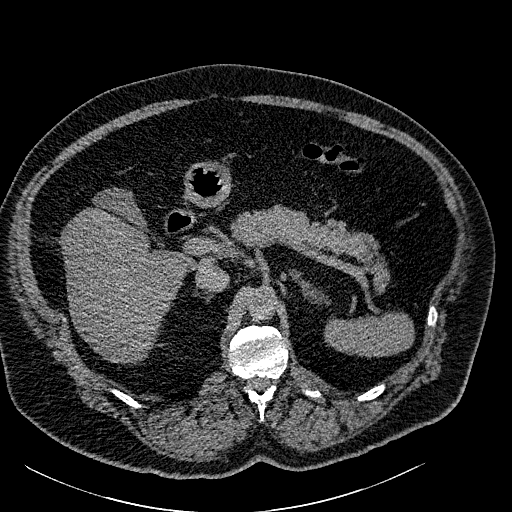
[im 13/175  lung]
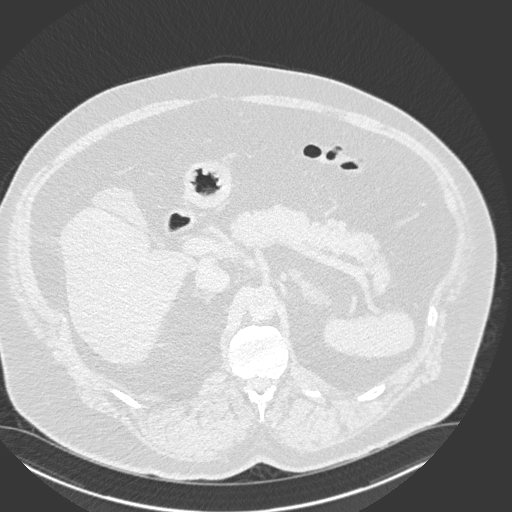
[im 26/175  lung]
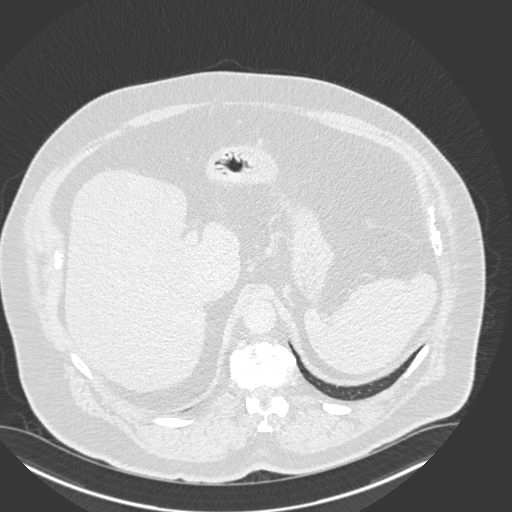
[im 39/175  lung]
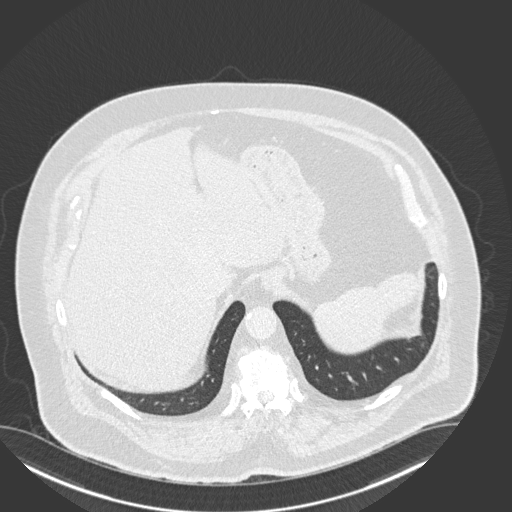
[im 52/175  lung]
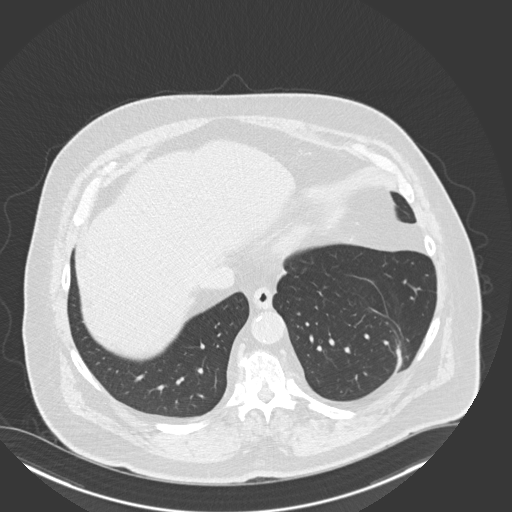
[im 65/175  mediastinal]
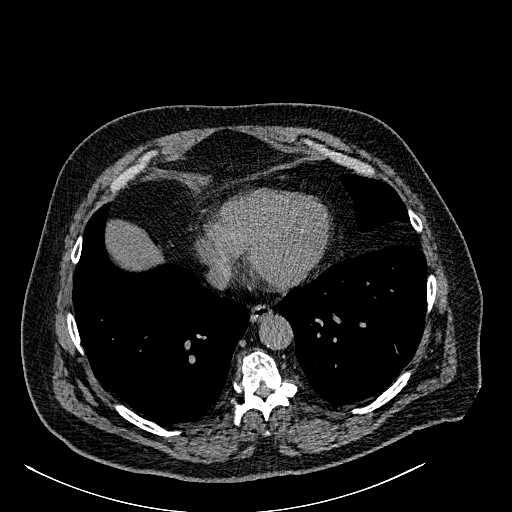
[im 65/175  lung]
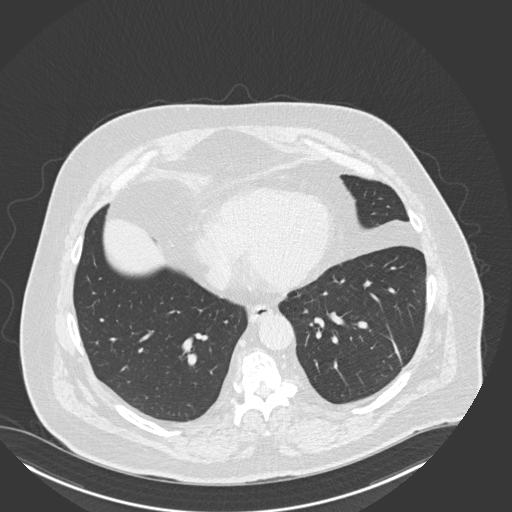
[im 78/175  lung]
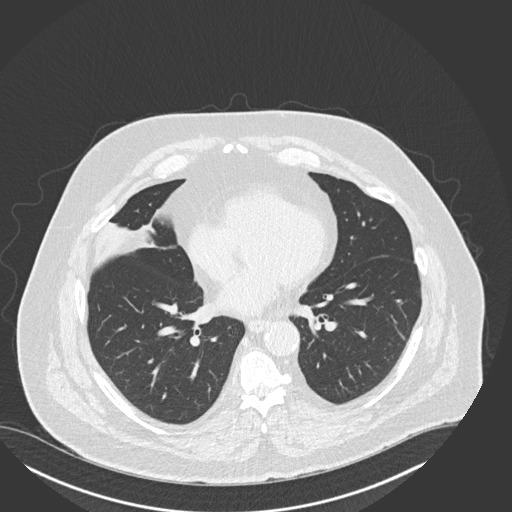
[im 97/175  lung]
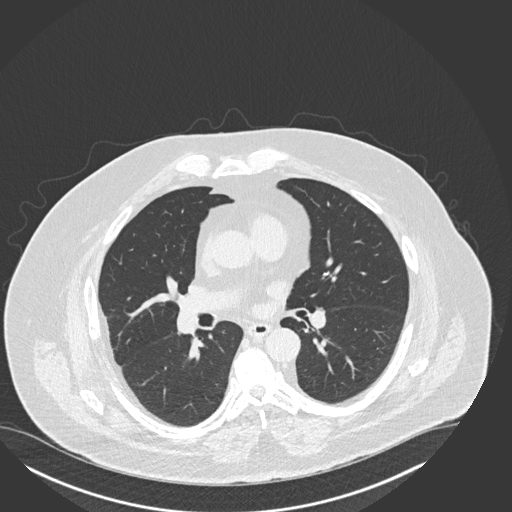
[im 110/175  lung]
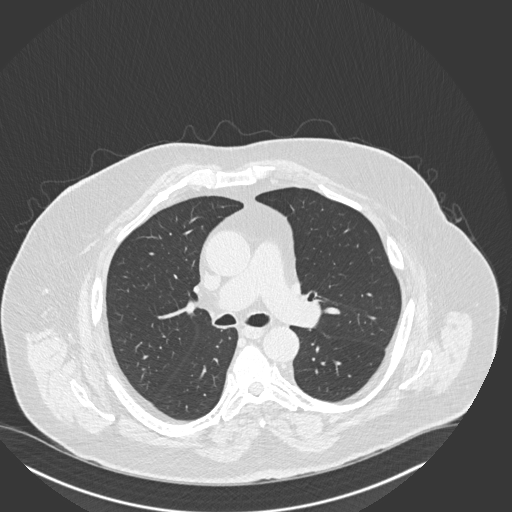
[im 123/175  mediastinal]
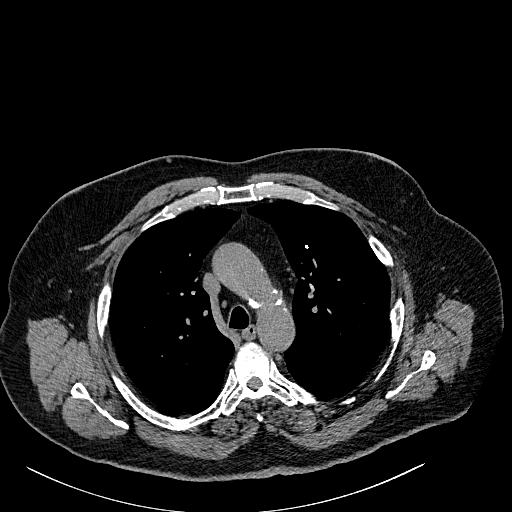
[im 123/175  lung]
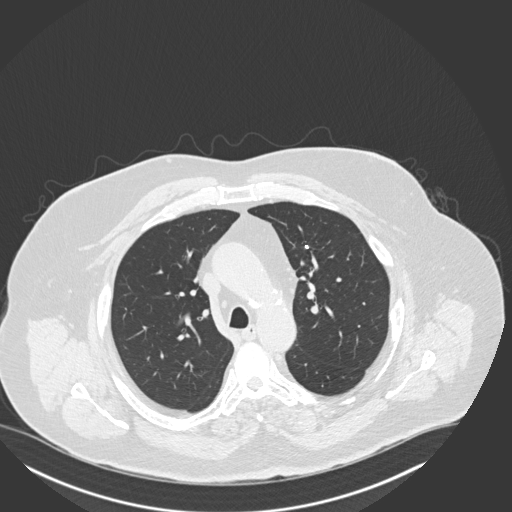
[im 136/175  lung]
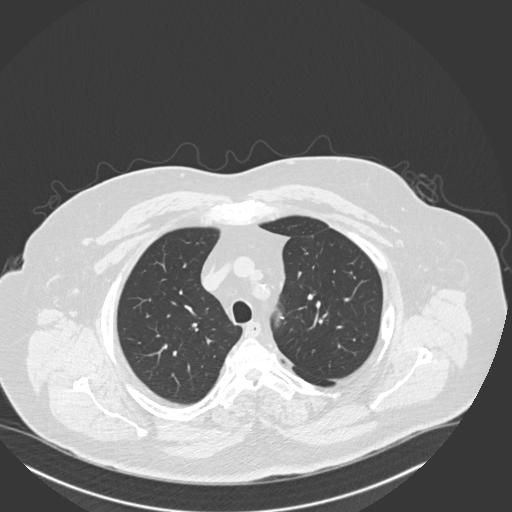
[im 149/175  lung]
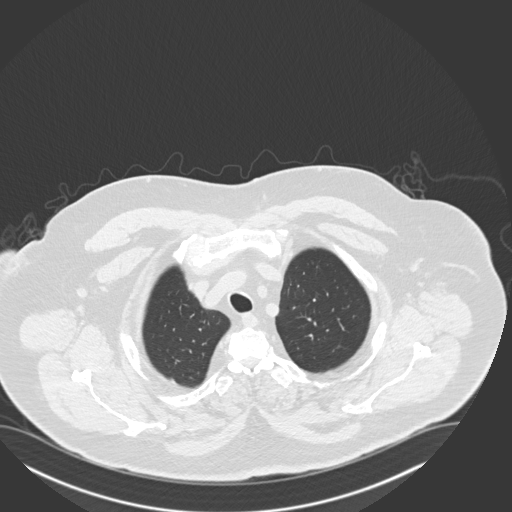
[im 162/175  lung]
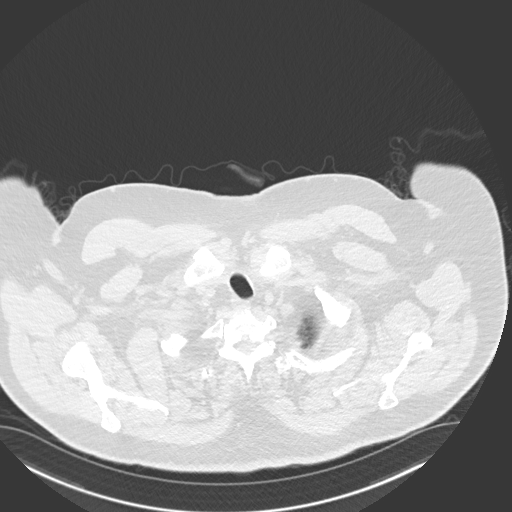

[Series 5: coronal · coronal · 0.72mm/px · 3 of 161 slices shown]
[im 33/161  lung]
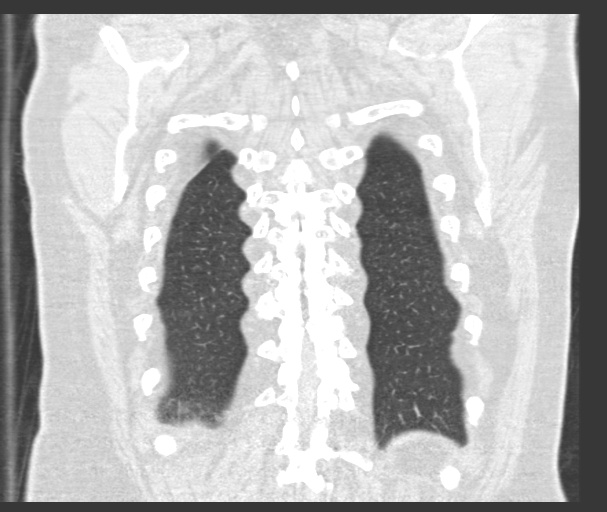
[im 65/161  lung]
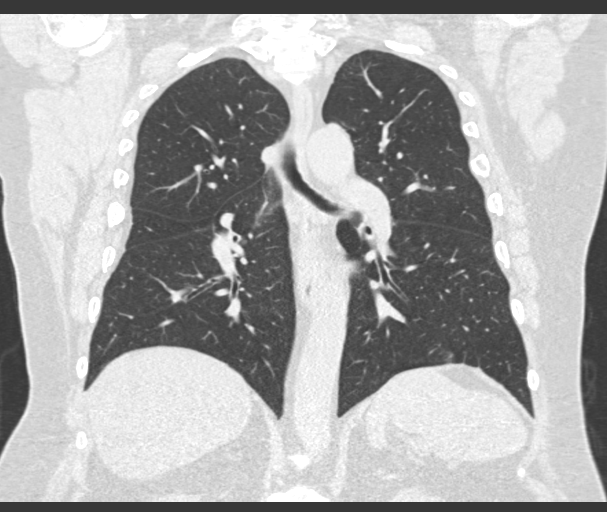
[im 97/161  lung]
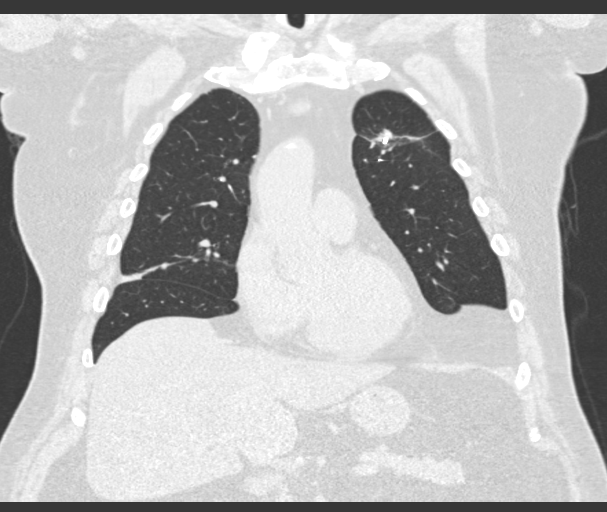

[15 of 36 positions shown; findings below may reference images not displayed]

FINDINGS: Cardiovascular: Heart size is normal. There is no significant
pericardial fluid, thickening or pericardial calcification. Aortic
atherosclerosis. No definite coronary artery calcifications.

Mediastinum/Nodes: No pathologically enlarged mediastinal or hilar
lymph nodes. Please note that accurate exclusion of hilar adenopathy
is limited on noncontrast CT scans. Esophagus is unremarkable in
appearance. No axillary lymphadenopathy.

Lungs/Pleura: Nodular area of architectural distortion surrounded by
septal thickening and ground-glass attenuation in the left upper
lobe, adjacent to 2 fiducial markers, compatible with a treated
neoplasm. This appears grossly stable compared to the prior study.
No other new suspicious appearing pulmonary nodules or masses are
noted. When compared to the prior study there has been worsening
scarring/atelectasis in the right middle lobe, predominantly in the
lateral segment.

Upper Abdomen: Aortic atherosclerosis. Diffuse low attenuation
throughout the visualized hepatic parenchyma, indicative of hepatic
steatosis. Fatty attenuation with some peripheral calcifications in
the right upper quadrant of the abdomen, incompletely imaged, but
most compatible with an area of fat necrosis, likely from prior
omental infarct.

Musculoskeletal: Chronic compression fracture of T11 with 30% loss
of anterior vertebral body height. Post vertebroplasty changes are
noted at T11 and T12. Several old healed bilateral rib fractures are
again noted. There are no aggressive appearing lytic or blastic
lesions noted in the visualized portions of the skeleton.
IMPRESSION: 1. Stable appearance of treated neoplasm in the left upper lobe. No
definitive findings to suggest residual or metastatic disease in the
thorax.
2. Worsening scarring/atelectasis in the lateral segment of the
right middle lobe.
3. Aortic atherosclerosis.
4. Hepatic steatosis.
5. Additional incidental findings, as above.

## 2022-04-15 NOTE — Progress Notes (Signed)
?[name] ? ?Timothy Conway ?[id number] ?July 09, 1956 66 y.o. ?04/15/2022 ? ? ?Principle Diagnosis:  ?Stage Ia (T1N0M0) adenocarcinoma of the left upper lung ? ?Current Therapy:   ?Status post radiosurgery-5400 rad that was completed in 08/20/2020 ?    ?Interim History:  Timothy Conway is back for follow-up.  We saw him 6 months ago.  He has been doing pretty well.  He really has had no complaints since we last saw him. ? ?We did do a CT scan of his chest today.  This, thankfully, did not show any evidence of growth.  His ability in the left upper lobe lung where he had his radiosurgery. ? ?He has had no problems with cough or shortness of breath.  He has had no change in bowel or bladder habits.  He has had no pain.  He is a little bit of leg swelling, but this is chronic. ? ?He has had no rashes. ? ?Overall, I would say his performance status is ECOG 1.  ? ?Medications:  ?Current Outpatient Medications:  ?  alendronate (FOSAMAX) 70 MG tablet, Take 1 tablet (70 mg total) by mouth every 7 (seven) days. Take with a full glass of water on an empty stomach., Disp: 12 tablet, Rfl: 3 ?  alprazolam (XANAX) 2 MG tablet, Take 2 mg by mouth 3 (three) times daily as needed (anxiety.). Pt taking 1/2 to 1 pill three times a day as needed., Disp: , Rfl:  ?  ARIPiprazole (ABILIFY) 20 MG tablet, Take 20 mg by mouth at bedtime. , Disp: , Rfl:  ?  atenolol (TENORMIN) 100 MG tablet, Take 100 mg by mouth 2 (two) times daily., Disp: , Rfl:  ?  CALCIUM PO, Take 1 tablet by mouth daily., Disp: , Rfl:  ?  Cholecalciferol (VITAMIN D) 2000 units tablet, Take 2,000 Units by mouth daily., Disp: , Rfl:  ?  fenofibrate 160 MG tablet, Take 160 mg by mouth daily with supper. , Disp: , Rfl:  ?  hydrocortisone 2.5 % cream, Apply 1 application topically 2 (two) times daily as needed., Disp: , Rfl:  ?  levothyroxine (SYNTHROID) 125 MCG tablet, Take 1 tablet (125 mcg total) by mouth daily., Disp: 90 tablet, Rfl: 1 ?   Multiple Vitamin (MULTIVITAMIN WITH MINERALS) TABS tablet, Take 1 tablet by mouth daily., Disp: , Rfl:  ?  omeprazole (PRILOSEC) 20 MG capsule, Take 20 mg by mouth daily before breakfast. , Disp: , Rfl:  ?  rosuvastatin (CRESTOR) 10 MG tablet, Take 10 mg by mouth daily. , Disp: , Rfl:  ? ?Allergies: No Known Allergies ? ?Past Medical History, Surgical history, Social history, and Family History were reviewed and updated. ? ?Review of Systems: ?Review of Systems  ?Constitutional: Negative.   ?HENT:  Negative.    ?Eyes: Negative.   ?Respiratory: Negative.    ?Cardiovascular: Negative.   ?Gastrointestinal: Negative.  Negative for abdominal distention.  ?Endocrine: Negative.   ?Genitourinary: Negative.    ?Musculoskeletal:  Positive for gait problem.  ?Skin: Negative.   ?Neurological:  Positive for gait problem.  ?Hematological: Negative.   ?Psychiatric/Behavioral: Negative.    ? ?Physical Exam: ? height is 6' (1.829 m) and weight is 278 lb (126.1 kg). His oral temperature is 98.4 ?F (36.9 ?C). His blood pressure is 122/64 and his pulse is 61. His respiration is 20 and oxygen saturation is 96%.  ? ?Wt Readings from Last 3 Encounters:  ?04/15/22 278 lb (126.1 kg)  ?12/12/21 281 lb 9.6  oz (127.7 kg)  ?10/16/21 271 lb (122.9 kg)  ? ? ?Physical Exam ?Vitals reviewed.  ?HENT:  ?   Head: Normocephalic and atraumatic.  ?Eyes:  ?   Pupils: Pupils are equal, round, and reactive to light.  ?Cardiovascular:  ?   Rate and Rhythm: Normal rate and regular rhythm.  ?   Heart sounds: Normal heart sounds.  ?Pulmonary:  ?   Effort: Pulmonary effort is normal.  ?   Breath sounds: Normal breath sounds.  ?Abdominal:  ?   General: Bowel sounds are normal.  ?   Palpations: Abdomen is soft.  ?Musculoskeletal:     ?   General: No tenderness or deformity. Normal range of motion.  ?   Cervical back: Normal range of motion.  ?Lymphadenopathy:  ?   Cervical: No cervical adenopathy.  ?Skin: ?   General: Skin is warm and dry.  ?   Findings: No  erythema or rash.  ?Neurological:  ?   Mental Status: He is alert and oriented to person, place, and time.  ?Psychiatric:     ?   Behavior: Behavior normal.     ?   Thought Content: Thought content normal.     ?   Judgment: Judgment normal.  ? ?Lab Results  ?Component Value Date  ? WBC 6.7 04/15/2022  ? HGB 17.0 04/15/2022  ? HCT 53.8 (H) 04/15/2022  ? MCV 96.2 04/15/2022  ? PLT 225 04/15/2022  ? ?  Chemistry   ?   ?Component Value Date/Time  ? NA 143 04/15/2022 1030  ? K 4.6 04/15/2022 1030  ? CL 105 04/15/2022 1030  ? CO2 31 04/15/2022 1030  ? BUN 16 04/15/2022 1030  ? CREATININE 1.22 04/15/2022 1030  ?    ?Component Value Date/Time  ? CALCIUM 8.5 (L) 04/15/2022 1030  ? ALKPHOS 28 (L) 04/15/2022 1030  ? AST 16 04/15/2022 1030  ? ALT 11 04/15/2022 1030  ? BILITOT 0.6 04/15/2022 1030  ?  ? ? ?Impression and Plan: ?Timothy Conway is a very nice 66 year old white male.  He had a non small cell lung cancer of the left upper lung.  This was an adenocarcinoma.  It was a low-grade adenocarcinoma based on the low level of activity on the PET scan.  He underwent radiosurgery for this.  He had this back in 2021. ? ?I think we should still follow him up.  I think we still will need a CT scan.  We will plan for a follow-up CT scan in another 6 months.  We will get this same day that we see him. ? ? ?[name], MD  ?5/17/202312:31 PM ?

## 2022-04-28 DIAGNOSIS — H2512 Age-related nuclear cataract, left eye: Secondary | ICD-10-CM | POA: Diagnosis not present

## 2022-04-28 DIAGNOSIS — H5203 Hypermetropia, bilateral: Secondary | ICD-10-CM | POA: Diagnosis not present

## 2022-04-28 DIAGNOSIS — Z0100 Encounter for examination of eyes and vision without abnormal findings: Secondary | ICD-10-CM | POA: Diagnosis not present

## 2022-04-29 DIAGNOSIS — Z79899 Other long term (current) drug therapy: Secondary | ICD-10-CM | POA: Diagnosis not present

## 2022-04-29 DIAGNOSIS — I10 Essential (primary) hypertension: Secondary | ICD-10-CM | POA: Diagnosis not present

## 2022-04-29 DIAGNOSIS — I719 Aortic aneurysm of unspecified site, without rupture: Secondary | ICD-10-CM | POA: Diagnosis not present

## 2022-04-29 DIAGNOSIS — E039 Hypothyroidism, unspecified: Secondary | ICD-10-CM | POA: Diagnosis not present

## 2022-04-29 DIAGNOSIS — Z85850 Personal history of malignant neoplasm of thyroid: Secondary | ICD-10-CM | POA: Diagnosis not present

## 2022-04-29 DIAGNOSIS — M810 Age-related osteoporosis without current pathological fracture: Secondary | ICD-10-CM | POA: Diagnosis not present

## 2022-04-29 DIAGNOSIS — Z0001 Encounter for general adult medical examination with abnormal findings: Secondary | ICD-10-CM | POA: Diagnosis not present

## 2022-04-29 DIAGNOSIS — E7800 Pure hypercholesterolemia, unspecified: Secondary | ICD-10-CM | POA: Diagnosis not present

## 2022-04-29 DIAGNOSIS — J449 Chronic obstructive pulmonary disease, unspecified: Secondary | ICD-10-CM | POA: Diagnosis not present

## 2022-04-29 DIAGNOSIS — Z125 Encounter for screening for malignant neoplasm of prostate: Secondary | ICD-10-CM | POA: Diagnosis not present

## 2022-05-14 ENCOUNTER — Ambulatory Visit: Payer: Medicare HMO | Admitting: Emergency Medicine

## 2022-06-05 ENCOUNTER — Encounter: Payer: Self-pay | Admitting: Internal Medicine

## 2022-06-05 ENCOUNTER — Ambulatory Visit: Payer: Medicare HMO | Admitting: Internal Medicine

## 2022-06-05 VITALS — BP 120/72 | HR 83 | Ht 72.0 in | Wt 277.2 lb

## 2022-06-05 DIAGNOSIS — C73 Malignant neoplasm of thyroid gland: Secondary | ICD-10-CM

## 2022-06-05 DIAGNOSIS — M810 Age-related osteoporosis without current pathological fracture: Secondary | ICD-10-CM | POA: Diagnosis not present

## 2022-06-05 DIAGNOSIS — E890 Postprocedural hypothyroidism: Secondary | ICD-10-CM | POA: Diagnosis not present

## 2022-06-05 LAB — TSH: TSH: 1.6 u[IU]/mL (ref 0.35–5.50)

## 2022-06-05 LAB — VITAMIN D 25 HYDROXY (VIT D DEFICIENCY, FRACTURES): VITD: 49.05 ng/mL (ref 30.00–100.00)

## 2022-06-05 LAB — T4, FREE: Free T4: 1.13 ng/dL (ref 0.60–1.60)

## 2022-06-05 NOTE — Patient Instructions (Signed)
Patient Instructions  Please continue Synthroid 125 mcg daily.  Take the thyroid hormone every day, with water, at least 30 minutes before breakfast, separated by at least 4 hours from: - acid reflux medications - calcium - iron - multivitamins  Please stop at the lab.  Continue vitamin D 2000 units daily.  Continue Fosamax 70 mg weekly.  Please come back for a follow-up appointment in 1 year but for labs in 6 months.

## 2022-06-05 NOTE — Progress Notes (Unsigned)
Patient ID: Timothy Conway, male   DOB: 1955-12-28, 66 y.o.   MRN: [id number]   HPI  Timothy Conway is a 66 y.o.-year-old male, initially referred by Dr. Harlow Asa, presenting for follow-up for papillary thyroid cancer, postsurgical hypothyroidism, and osteoporosis.  Last visit 6 months ago  Interim history: In 2021, he was diagnosed with 11 mm left adenocarcinoma. He had radiotactic RxTx.  CT scan in 2021 showed a decrease in size of his tumor.  He continues to be followed by oncology. In 2022, he fell onto his tailbone and fractured 1 vertebra.  He had kyphoplasty and then had another compression fracture.  His back pain improved since then.  We started him on Fosamax for clinical osteoporosis.  He tolerates it well.  No jaw/new hip/thigh pain. He fell once since last visit after tripping on his cat but broke the fall with both hands.  No fractures. At last visit he had an approximate 8 pound weight gain and more knee pain.  He continues to have knee pain, but lost 4 pounds since then.  ThyCA: Reviewed and addended his thyroid cancer history: Pt. has been found to have a hypermetabolic thyroid nodule on a PET scan (07/13/2017) obtained by Dr. Lamonte Sakai, his pulmonologist, to investigate a lung nodule.  A thyroid U/S (07/30/2017) showed a L lobe 1.3 x 1.0 x 1.2 cm nodule, solid, hypoechoic, lobulated/irregular, with peripheral calcifications. Bx of this nodule (07/30/2017): PTC  Total thyroidectomy (08/27/2017) by Dr. Harlow Asa: Diagnosis 1. Thyroid, thyroidectomy, total - PAPILLARY THYROID CARCINOMA, CLASSIC TYPE, SPANNING 1.4 CM. - NO EXTRATHYROIDAL EXTENSION. - RESECTION MARGINS ARE NEGATIVE. - SEE ONCOLOGY TABLE. 2. Lymph node, biopsy, central compartment - THREE OF THREE LYMPH NODES NEGATIVE FOR CARCINOMA (0/3). - ONE BENIGN PARATHYROID. Microscopic Comment 1. THYROID Specimen: Total thyroid with central compartment lymph nodes. Procedure (including lymph node sampling if applicable):  Total thyroidectomy with central lymph node biopsy. Specimen Integrity (intact/fragmented): Intact. Tumor focality: Unifocal. Dominant tumor: Maximum tumor size (cm): 1.4 cm. Tumor laterality: Left. Histologic type (including subtype and/or unique features as applicable): Papillary thyroid carcinoma, classic type. Tumor capsule: Present. Extrathyroidal extension: Absent. Capsular invasion with degree of invasion if present: N/A. Margins: Negative. Lymphatic or vascular invasion: Not identified. Lymph nodes: # examined 3; # positive; 0 Extracapsular extension (if applicable): N/A. TNM code: pT1b, pN0 Non-neoplastic thyroid: Largely unremarkable. Comments: There tumor is largely fibrotic and calcified with only a small portion of viable tumor.  Neck U/S (09/25/2019): The thyroid gland is surgically absent. Evaluation of the left and right resection bed demonstrates no evidence of residual thyroid tissue or nodularity. No suspicious lymphadenopathy is identified. IMPRESSION: Surgical changes of prior total thyroidectomy without evidence of residual or recurrent thyroid tissue, nodularity or lymphadenopathy.  PET/CT (09/15/2020): Increased uptake in the base of the tongue and anterior paravertebral muscles  CT neck (07/24/2020): Increased uptake is most likely related to tonsillar inflammation  CT chest (11/14/2020) without contrast: No suspicious masses in mediastinum or neck.  CT chest (06/24/2021): Mediastinum/Nodes: No enlarged mediastinal, hilar, or axillary lymph nodes. Thyroid gland, trachea, and esophagus demonstrate no significant findings.  CT chest (10/16/2021): Unchanged appearance of predominantly bandlike post radiation fibrosis and adjacent ground-glass about fiducial markers of the anterior left upper lobe. No evidence of recurrent or metastatic disease in the chest.  CT chest (04/15/2022): Mediastinum/Nodes: No pathologically enlarged mediastinal or  hilar lymph nodes. Please note that accurate exclusion of hilar adenopathy is limited on noncontrast CT scans. Esophagus is unremarkable in appearance.  No axillary lymphadenopathy.   Lungs/Pleura: Nodular area of architectural distortion surrounded by septal thickening and ground-glass attenuation in the left upper lobe, adjacent to 2 fiducial markers, compatible with a treated neoplasm. This appears grossly stable compared to the prior study. No other new suspicious appearing pulmonary nodules or masses are noted. When compared to the prior study there has been worsening scarring/atelectasis in the right middle lobe, predominantly in the lateral segment.   IMPRESSION: 1. Stable appearance of treated neoplasm in the left upper lobe. No definitive findings to suggest residual or metastatic disease in the thorax. 2. Worsening scarring/atelectasis in the lateral segment of the right middle lobe. 3. Aortic atherosclerosis. 4. Hepatic steatosis. 5. Additional incidental findings, as above.  Thyroglobulin is detectable: Lab Results  Component Value Date   THYROGLB 0.3 (L) 09/11/2021   THYROGLB 0.1 (L) 10/29/2020   THYROGLB 0.7 (L) 09/17/2020   THYROGLB 0.1 (L) 09/14/2019   THYROGLB 0.2 (L) 09/08/2018   THYROGLB 0.1 (L) 03/23/2018   His antithyroid antibodies are undetectable: Lab Results  Component Value Date   THGAB <1 09/11/2021   THGAB <1 10/29/2020   THGAB <1 09/17/2020   THGAB <1 09/14/2019   THGAB <1 09/08/2018   THGAB <1 03/23/2018   Postsurgical hypothyroidism  He is on Levothyroxine 125 mcg daily (last dose decrease 09/2021): - in am - fasting - at least 30 min from b'fast (however, he usually skips breakfast) - + calcium 1x a day with dinner - no iron - + multivitamins at night - + PPIs at night (Omeprazole) - not on Biotin + Magnesium and Zinc - at night.  Reviewed his TFTs: Lab Results  Component Value Date   TSH 0.93 12/12/2021   TSH 0.19 (L)  10/28/2021   TSH 0.15 (L) 09/11/2021   TSH 0.76 12/25/2020   TSH 0.28 (L) 10/29/2020   TSH 12.27 (H) 09/17/2020   TSH 0.64 09/14/2019   TSH 4.66 (H) 09/08/2018   TSH 0.52 03/23/2018   TSH 12.75 (H) 02/11/2018   FREET4 1.10 12/12/2021   FREET4 0.89 10/28/2021   FREET4 1.17 09/11/2021   FREET4 1.19 12/25/2020   FREET4 1.13 10/29/2020   FREET4 0.77 09/17/2020   FREET4 1.04 09/14/2019   FREET4 0.73 09/08/2018   FREET4 0.87 03/23/2018   FREET4 0.62 02/11/2018    Pt denies: - feeling nodules in neck - hoarseness - dysphagia - choking - SOB with lying down  Unknown family history of thyroid disease or thyroid cancer since he was adopted. No h/o radiation tx to head or neck.  No herbal supplements. No Biotin use. No recent steroids use.   Clinical osteoporosis:  He was found to have a vertebral fracture after fall from level ground Spring 2022.  MRI lumbar spine (08/09/2021): 1. Compression fracture of the T11 vertebral body status post vertebral augmentation with persistent marrow edema. 2. Minor compression fracture of the superior endplate of N82 on the right side with persistent marrow edema without significant height loss.  CT chest (04/15/2022): Musculoskeletal: Chronic compression fracture of T11 with 30% loss of anterior vertebral body height. Post vertebroplasty changes are noted at T11 and T12. Several old healed bilateral rib fractures are again noted. There are no aggressive appearing lytic or blastic lesions noted in the visualized portions of the skeleton.  He had a bone density scan several years ago in [facility] reportedly showing osteopenia.  A repeat bone density scan obtained since last visit showed osteopenia:  DXA: 09/16/2021 ([place] [facility]) Lumbar spine  L1-L4 Femoral neck (FN)  T-score  1.2 RFN: -1.0 LFN: -1.2    We started Fosamax 70 mg weekly in 08/2021.  He tolerates this well.  No other fractures as an adult, but had a wrist fracture as a  child.  He has a history of vitamin D deficiency, but latest level was normal: Lab Results  Component Value Date   VD25OH 60.58 09/11/2021   He was advised by PCP to start 2000 units vitamin D daily 2-3 years ago >> he started 5000 units daily as he could not find a 2000 unit dose.  He  is now back on 2000 units.  ROS: + See HPI Neurological: + tremors/no numbness/no tingling/no dizziness  I reviewed pt's medications, allergies, PMH, social hx, family hx, and changes were documented in the history of present illness. Otherwise, unchanged from my initial visit note.  Past Medical History:  Diagnosis Date   Anxiety    COPD (chronic obstructive pulmonary disease) (HCC)    Depression    Dry eye syndrome    Dysrhythmia    hx of PAC's   GERD (gastroesophageal reflux disease)    Gout    Hyperlipidemia    Hypertension    Hypothyroidism    Thyroid cancer ([place])    2018   Past Surgical History:  Procedure Laterality Date   ABDOMINAL SURGERY     hemmoraging ulcer   BRONCHIAL BIOPSY  06/25/2020   Procedure: BRONCHIAL BIOPSIES;  Surgeon: Collene Gobble, MD;  Location: [facility] ENDOSCOPY;  Service: Pulmonary;;   BRONCHIAL BRUSHINGS  06/25/2020   Procedure: BRONCHIAL BRUSHINGS;  Surgeon: Collene Gobble, MD;  Location: [place];  Service: Pulmonary;;   BRONCHIAL NEEDLE ASPIRATION BIOPSY  06/25/2020   Procedure: BRONCHIAL NEEDLE ASPIRATION BIOPSIES;  Surgeon: Collene Gobble, MD;  Location: [facility] ENDOSCOPY;  Service: Pulmonary;;   BRONCHIAL WASHINGS  06/25/2020   Procedure: BRONCHIAL WASHINGS;  Surgeon: Collene Gobble, MD;  Location: [facility] ENDOSCOPY;  Service: Pulmonary;;   CATARACT EXTRACTION Right    COLONOSCOPY     EYE SURGERY Right    x 4  between ages 39-13   FIDUCIAL MARKER PLACEMENT  06/25/2020   Procedure: FIDUCIAL MARKER PLACEMENT;  Surgeon: Collene Gobble, MD;  Location: [facility] ENDOSCOPY;  Service: Pulmonary;;   THYROIDECTOMY N/A 08/27/2017   Procedure: TOTAL THYROIDECTOMY WITH LIMITED  LYMPH NODE DISSECTION;  Surgeon: Armandina Gemma, MD;  Location: [facility] ORS;  Service: General;  Laterality: N/A;   UPPER GI ENDOSCOPY     VIDEO BRONCHOSCOPY WITH ENDOBRONCHIAL NAVIGATION N/A 06/25/2020   Procedure: VIDEO BRONCHOSCOPY WITH ENDOBRONCHIAL NAVIGATION;  Surgeon: Collene Gobble, MD;  Location: [facility] ENDOSCOPY;  Service: Pulmonary;  Laterality: N/A;   Social History   Social History   Marital status: Single    Spouse name: N/A   Number of children: 0   Occupational History   n/a   Social History Main Topics   Smoking status: Current Every Day Smoker    Packs/day: 1.00    Years: 40.00    Types: Cigarettes   Smokeless tobacco: Never Used     Comment: 1 1/2 per day   Alcohol use 0.6 oz/week    3-4 Cans of beer per day   Drug use: No   Current Outpatient Medications on File Prior to Visit  Medication Sig Dispense Refill   alendronate (FOSAMAX) 70 MG tablet Take 1 tablet (70 mg total) by mouth every 7 (seven) days. Take with a full glass of water on an  empty stomach. 12 tablet 3   alprazolam (XANAX) 2 MG tablet Take 2 mg by mouth 3 (three) times daily as needed (anxiety.). Pt taking 1/2 to 1 pill three times a day as needed.     ARIPiprazole (ABILIFY) 20 MG tablet Take 20 mg by mouth at bedtime.      atenolol (TENORMIN) 100 MG tablet Take 100 mg by mouth 2 (two) times daily.     CALCIUM PO Take 1 tablet by mouth daily.     Cholecalciferol (VITAMIN D) 2000 units tablet Take 2,000 Units by mouth daily.     fenofibrate 160 MG tablet Take 160 mg by mouth daily with supper.      hydrocortisone 2.5 % cream Apply 1 application topically 2 (two) times daily as needed.     levothyroxine (SYNTHROID) 125 MCG tablet Take 1 tablet (125 mcg total) by mouth daily. 90 tablet 1   Multiple Vitamin (MULTIVITAMIN WITH MINERALS) TABS tablet Take 1 tablet by mouth daily.     omeprazole (PRILOSEC) 20 MG capsule Take 20 mg by mouth daily before breakfast.      rosuvastatin (CRESTOR) 10 MG tablet Take 10  mg by mouth daily.      No current facility-administered medications on file prior to visit.   No Known Allergies Family History  Adopted: Yes   PE: BP 120/72 (BP Location: Left Arm, Patient Position: Sitting, Cuff Size: Normal)   Pulse 83   Ht 6' (1.829 m)   Wt 277 lb 3.2 oz (125.7 kg)   SpO2 91%   BMI 37.60 kg/m  Wt Readings from Last 3 Encounters:  06/05/22 277 lb 3.2 oz (125.7 kg)  04/15/22 278 lb (126.1 kg)  12/12/21 281 lb 9.6 oz (127.7 kg)   Constitutional: overweight, in NAD Eyes: PERRLA, EOMI, no exophthalmos ENT: moist mucous membranes, no neck masses palpated, no cervical lymphadenopathy Cardiovascular: RRR, No MRG Respiratory: CTA B Musculoskeletal: no deformities, strength intact in all 4 Skin: moist, warm, no rashes Neurological: + tremor with outstretched hands, DTR normal in all 4  ASSESSMENT: 1. Thyroid cancer - see HPI  2. Postsurgical Hypothyroidism  3. Vertebral fracture  PLAN:  1. Papillary Thyroid Cancer -Patient with history of classic type papillary thyroid cancer, not very large, encapsulated, without extrathyroidal extension, without lymphovascular invasion and negative lymph node biopsies.  Therefore, his cancer was considered low risk and we decided to continue without RAI treatment per the 2015 ATA thyroid cancer guidelines.  We are following him with thyroglobulin tumor marker and also with neck ultrasounds.  We did discuss that even if he ends up needing RAI treatment as a salvage therapy in the future, his prognosis would likely not be changed. -The latest neck ultrasound did not show any metastasis or recurrence is in neck.  Also, PET scan and CT scans obtained during his lung cancer staging did not show thyroid cancer recurrence or metastasis.  Latest CT scan report from 03/2022 was reviewed today. -At last check, thyroglobulin level was still detectable, at 0.3.  This was slightly higher than before.  However, in the absence of RAI  treatment, we discussed the thyroglobulin levels can fluctuate.  ATA antibodies are undetectable. -At today's visit, we will recheck his thyroglobulin and ATA antibodies -I will see him back in a year  2. Patient with history of total thyroidectomy for thyroid cancer, now with iatrogenic hypothyroidism, on Synthroid therapy. -Latest TFTs were normal: Lab Results  Component Value Date   TSH 0.93 12/12/2021  -  he continues on 125 mcg levothyroxine daily - pt feels good on this dose.  He had fatigue which improves after we increase the dose of levothyroxine - we discussed about taking the thyroid hormone every day, with water, >30 minutes before breakfast, separated by >4 hours from acid reflux medications, calcium, iron, multivitamins. Pt. is taking it correctly. - will check thyroid tests today: TSH and fT4 - If labs are abnormal, he will need to return for repeat TFTs in 1.5 months -I will see him back in a year, but with labs in 6 months.  3.  Clinical osteoporosis -No fractures since last visit - fell after he tripped on his cat -In 2022, he had 2 vertebral fractures, as a consequence of a fall from level ground, qualifying as a fragility fracture.  This puts him into the osteoporotic category. -He had kyphoplasty.  Back pain improved after this. -He continues on vitamin D 5000 units daily.  Latest level was normal in 08/2022.  Will recheck another vitamin D level today. -We checked a bone density scan and this showed osteopenia, however, due to fragility fractures, he qualifies for clinical osteoporosis. -Due to his history of radiation therapy, he has a contraindication to [employer] or [employer]. -We ended up starting Fosamax 70 mg weekly last year.  He tolerates this well and taking it correctly, 30 minutes before levothyroxine, with a full glass of water -Plan to continue Fosamax for 5 years, but repeat a bone density scan 2 years from the previous  Needs refills.  Philemon Kingdom, MD  PhD [facility] Endocrinology

## 2022-06-08 LAB — THYROGLOBULIN LEVEL: Thyroglobulin: 0.2 ng/mL — ABNORMAL LOW

## 2022-06-08 LAB — THYROGLOBULIN ANTIBODY: Thyroglobulin Ab: <1 IU/mL (ref <=1)

## 2022-06-08 MED ORDER — LEVOTHYROXINE SODIUM 125 MCG PO TABS: 125.0000 ug | ORAL_TABLET | Freq: Every day | ORAL | 3 refills | Status: DC

## 2022-06-10 DIAGNOSIS — M810 Age-related osteoporosis without current pathological fracture: Secondary | ICD-10-CM | POA: Diagnosis not present

## 2022-06-10 DIAGNOSIS — E7800 Pure hypercholesterolemia, unspecified: Secondary | ICD-10-CM | POA: Diagnosis not present

## 2022-06-10 DIAGNOSIS — E039 Hypothyroidism, unspecified: Secondary | ICD-10-CM | POA: Diagnosis not present

## 2022-06-10 DIAGNOSIS — J449 Chronic obstructive pulmonary disease, unspecified: Secondary | ICD-10-CM | POA: Diagnosis not present

## 2022-06-10 DIAGNOSIS — I10 Essential (primary) hypertension: Secondary | ICD-10-CM | POA: Diagnosis not present

## 2022-06-10 DIAGNOSIS — F321 Major depressive disorder, single episode, moderate: Secondary | ICD-10-CM | POA: Diagnosis not present

## 2022-08-24 DIAGNOSIS — M810 Age-related osteoporosis without current pathological fracture: Secondary | ICD-10-CM | POA: Diagnosis not present

## 2022-08-24 DIAGNOSIS — F321 Major depressive disorder, single episode, moderate: Secondary | ICD-10-CM | POA: Diagnosis not present

## 2022-08-24 DIAGNOSIS — E039 Hypothyroidism, unspecified: Secondary | ICD-10-CM | POA: Diagnosis not present

## 2022-08-24 DIAGNOSIS — E7800 Pure hypercholesterolemia, unspecified: Secondary | ICD-10-CM | POA: Diagnosis not present

## 2022-08-24 DIAGNOSIS — J449 Chronic obstructive pulmonary disease, unspecified: Secondary | ICD-10-CM | POA: Diagnosis not present

## 2022-08-24 DIAGNOSIS — I10 Essential (primary) hypertension: Secondary | ICD-10-CM | POA: Diagnosis not present

## 2022-10-16 ENCOUNTER — Inpatient Hospital Stay (HOSPITAL_BASED_OUTPATIENT_CLINIC_OR_DEPARTMENT_OTHER): Payer: Medicare HMO | Admitting: Hematology & Oncology

## 2022-10-16 ENCOUNTER — Inpatient Hospital Stay: Payer: Medicare HMO | Attending: Hematology & Oncology

## 2022-10-16 ENCOUNTER — Ambulatory Visit (HOSPITAL_BASED_OUTPATIENT_CLINIC_OR_DEPARTMENT_OTHER)
Admission: RE | Admit: 2022-10-16 | Discharge: 2022-10-16 | Disposition: A | Discharge status: Home / Self Care | Payer: Medicare HMO | Source: Ambulatory Visit | Attending: Hematology & Oncology | Admitting: Hematology & Oncology

## 2022-10-16 VITALS — BP 135/65 | HR 68 | Temp 98.0°F | Resp 17 | Ht 72.0 in | Wt 266.0 lb

## 2022-10-16 DIAGNOSIS — J9811 Atelectasis: Secondary | ICD-10-CM | POA: Diagnosis not present

## 2022-10-16 DIAGNOSIS — C3492 Malignant neoplasm of unspecified part of left bronchus or lung: Secondary | ICD-10-CM

## 2022-10-16 DIAGNOSIS — I700 Atherosclerosis of aorta: Secondary | ICD-10-CM | POA: Diagnosis not present

## 2022-10-16 DIAGNOSIS — C3412 Malignant neoplasm of upper lobe, left bronchus or lung: Secondary | ICD-10-CM | POA: Insufficient documentation

## 2022-10-16 DIAGNOSIS — J439 Emphysema, unspecified: Secondary | ICD-10-CM | POA: Diagnosis not present

## 2022-10-16 DIAGNOSIS — K760 Fatty (change of) liver, not elsewhere classified: Secondary | ICD-10-CM | POA: Insufficient documentation

## 2022-10-16 DIAGNOSIS — C3490 Malignant neoplasm of unspecified part of unspecified bronchus or lung: Secondary | ICD-10-CM | POA: Diagnosis not present

## 2022-10-16 LAB — CBC WITH DIFFERENTIAL (CANCER CENTER ONLY)
Abs Immature Granulocytes: 0.01 10*3/uL (ref 0.00–0.07)
Basophils Absolute: 0.1 10*3/uL (ref 0.0–0.1)
Basophils Relative: 1 %
Eosinophils Absolute: 0.2 10*3/uL (ref 0.0–0.5)
Eosinophils Relative: 3 %
HCT: 51.2 % (ref 39.0–52.0)
Hemoglobin: 16.2 g/dL (ref 13.0–17.0)
Immature Granulocytes: 0 %
Lymphocytes Relative: 30 %
Lymphs Abs: 1.8 10*3/uL (ref 0.7–4.0)
MCH: 30.2 pg (ref 26.0–34.0)
MCHC: 31.6 g/dL (ref 30.0–36.0)
MCV: 95.3 fL (ref 80.0–100.0)
Monocytes Absolute: 0.4 10*3/uL (ref 0.1–1.0)
Monocytes Relative: 6 %
Neutro Abs: 3.6 10*3/uL (ref 1.7–7.7)
Neutrophils Relative %: 60 %
Platelet Count: 217 10*3/uL (ref 150–400)
RBC: 5.37 MIL/uL (ref 4.22–5.81)
RDW: 12.4 % (ref 11.5–15.5)
WBC Count: 5.9 10*3/uL (ref 4.0–10.5)
nRBC: 0 % (ref 0.0–0.2)

## 2022-10-16 LAB — CMP (CANCER CENTER ONLY)
ALT: 11 U/L (ref 0–44)
AST: 18 U/L (ref 15–41)
Albumin: 4.3 g/dL (ref 3.5–5.0)
Alkaline Phosphatase: 28 U/L — ABNORMAL LOW (ref 38–126)
Anion gap: 7 (ref 5–15)
BUN: 16 mg/dL (ref 8–23)
CO2: 32 mmol/L (ref 22–32)
Calcium: 8.5 mg/dL — ABNORMAL LOW (ref 8.9–10.3)
Chloride: 103 mmol/L (ref 98–111)
Creatinine: 1.27 mg/dL — ABNORMAL HIGH (ref 0.61–1.24)
GFR, Estimated: >60 mL/min (ref >60)
Glucose, Bld: 96 mg/dL (ref 70–99)
Potassium: 4.6 mmol/L (ref 3.5–5.1)
Sodium: 142 mmol/L (ref 135–145)
Total Bilirubin: 0.7 mg/dL (ref 0.3–1.2)
Total Protein: 6.9 g/dL (ref 6.5–8.1)

## 2022-10-16 NOTE — Progress Notes (Signed)
[name]  DVONTE Conway [id number] 1956-06-30 66 y.o. 10/16/2022   Principle Diagnosis:  Stage Ia (T1N0M0) adenocarcinoma of the left upper lung  Current Therapy:   Status post radiosurgery-5400 rad that was completed in 08/20/2020     Interim History:  Timothy Conway is back for follow-up.  We see him every 6 months.  Since we last saw him, he has been doing pretty well.  He is got be at a friend's house for Thanksgiving.  We did do a CT of the chest today.  This did not show any evidence of recurrent disease.  He has a left upper lobe radiation changes.  He has hepatic steatosis.  His main complaint has been dizziness.  He has had this for the past several months.  He has not fallen.  He says he has to walk little bit stiff so he does not fall.  Given the fact that he has had lung cancer, we will going to have to get an MRI of the brain to make sure there is no evidence of recurrent disease.    His appetite has been good.  He has had no nausea or vomiting.  There is no change in bowel or bladder habits.  He has had no leg swelling.  There has been no rashes.  He has had no fever.  He has avoided COVID.  Overall, I would say his performance status is ECOG 1.    Medications:  Current Outpatient Medications:    alendronate (FOSAMAX) 70 MG tablet, Take 1 tablet (70 mg total) by mouth every 7 (seven) days. Take with a full glass of water on an empty stomach., Disp: 12 tablet, Rfl: 3   alprazolam (XANAX) 2 MG tablet, Take 2 mg by mouth 3 (three) times daily as needed (anxiety.). Pt taking 1/2 to 1 pill three times a day as needed., Disp: , Rfl:    ARIPiprazole (ABILIFY) 20 MG tablet, Take 10 mg by mouth at bedtime., Disp: , Rfl:    atenolol (TENORMIN) 100 MG tablet, Take 100 mg by mouth 2 (two) times daily., Disp: , Rfl:    CALCIUM PO, Take 1 tablet by mouth daily., Disp: , Rfl:    Cholecalciferol (VITAMIN D) 2000 units tablet, Take 2,000 Units by mouth  daily., Disp: , Rfl:    fenofibrate 160 MG tablet, Take 160 mg by mouth daily with supper. , Disp: , Rfl:    hydrocortisone 2.5 % cream, Apply 1 application topically 2 (two) times daily as needed., Disp: , Rfl:    levothyroxine (SYNTHROID) 125 MCG tablet, Take 1 tablet (125 mcg total) by mouth daily., Disp: 90 tablet, Rfl: 3   Multiple Vitamin (MULTIVITAMIN WITH MINERALS) TABS tablet, Take 1 tablet by mouth daily., Disp: , Rfl:    omeprazole (PRILOSEC) 20 MG capsule, Take 20 mg by mouth daily before breakfast. , Disp: , Rfl:    rosuvastatin (CRESTOR) 10 MG tablet, Take 10 mg by mouth daily. , Disp: , Rfl:    traZODone (DESYREL) 50 MG tablet, Take 50-150 mg by mouth at bedtime as needed., Disp: , Rfl:   Allergies: No Known Allergies  Past Medical History, Surgical history, Social history, and Family History were reviewed and updated.  Review of Systems: Review of Systems  Constitutional: Negative.   HENT:  Negative.    Eyes: Negative.   Respiratory: Negative.    Cardiovascular: Negative.   Gastrointestinal: Negative.  Negative for abdominal distention.  Endocrine: Negative.  Genitourinary: Negative.    Musculoskeletal:  Positive for gait problem.  Skin: Negative.   Neurological:  Positive for gait problem.  Hematological: Negative.   Psychiatric/Behavioral: Negative.      Physical Exam:  height is 6' (1.829 m) and weight is 266 lb (120.7 kg). His oral temperature is 98 F (36.7 C). His blood pressure is 135/65 and his pulse is 68. His respiration is 17 and oxygen saturation is 97%.   Wt Readings from Last 3 Encounters:  10/16/22 266 lb (120.7 kg)  06/05/22 277 lb 3.2 oz (125.7 kg)  04/15/22 278 lb (126.1 kg)    Physical Exam Vitals reviewed.  HENT:     Head: Normocephalic and atraumatic.  Eyes:     Pupils: Pupils are equal, round, and reactive to light.  Cardiovascular:     Rate and Rhythm: Normal rate and regular rhythm.     Heart sounds: Normal heart sounds.   Pulmonary:     Effort: Pulmonary effort is normal.     Breath sounds: Normal breath sounds.  Abdominal:     General: Bowel sounds are normal.     Palpations: Abdomen is soft.  Musculoskeletal:        General: No tenderness or deformity. Normal range of motion.     Cervical back: Normal range of motion.  Lymphadenopathy:     Cervical: No cervical adenopathy.  Skin:    General: Skin is warm and dry.     Findings: No erythema or rash.  Neurological:     Mental Status: He is alert and oriented to person, place, and time.  Psychiatric:        Behavior: Behavior normal.        Thought Content: Thought content normal.        Judgment: Judgment normal.    Lab Results  Component Value Date   WBC 5.9 10/16/2022   HGB 16.2 10/16/2022   HCT 51.2 10/16/2022   MCV 95.3 10/16/2022   PLT 217 10/16/2022     Chemistry      Component Value Date/Time   NA 142 10/16/2022 1038   K 4.6 10/16/2022 1038   CL 103 10/16/2022 1038   CO2 32 10/16/2022 1038   BUN 16 10/16/2022 1038   CREATININE 1.27 (H) 10/16/2022 1038      Component Value Date/Time   CALCIUM 8.5 (L) 10/16/2022 1038   ALKPHOS 28 (L) 10/16/2022 1038   AST 18 10/16/2022 1038   ALT 11 10/16/2022 1038   BILITOT 0.7 10/16/2022 1038      Impression and Plan: Timothy Conway is a very nice 66 year old white male.  He had a non small cell lung cancer of the left upper lung.  This was an adenocarcinoma.  It was a low-grade adenocarcinoma based on the low level of activity on the PET scan.  He underwent radiosurgery for this.  He had this back in 2021.  I will set him up with a MRI of the brain.  If this MRI is negative, then we will have to see about having his family doctor help with the dizziness.  From a malignant point of view, I will have to get another CT scan on him in 6 months.  I think if this CT scan is negative, then we can probably do chest x-rays from that point onward.  I am just glad that his quality life is doing so  well.   [name], MD  11/17/202311:55 AM

## 2022-11-07 ENCOUNTER — Telehealth: Payer: Self-pay | Admitting: Hematology

## 2022-11-07 ENCOUNTER — Ambulatory Visit (HOSPITAL_COMMUNITY)
Admission: RE | Admit: 2022-11-07 | Discharge: 2022-11-07 | Disposition: A | Discharge status: Home / Self Care | Payer: Medicare HMO | Source: Ambulatory Visit | Attending: Hematology & Oncology | Admitting: Hematology & Oncology

## 2022-11-07 DIAGNOSIS — C3492 Malignant neoplasm of unspecified part of left bronchus or lung: Secondary | ICD-10-CM | POA: Insufficient documentation

## 2022-11-07 DIAGNOSIS — R42 Dizziness and giddiness: Secondary | ICD-10-CM | POA: Diagnosis not present

## 2022-11-07 DIAGNOSIS — I6381 Other cerebral infarction due to occlusion or stenosis of small artery: Secondary | ICD-10-CM | POA: Diagnosis not present

## 2022-11-07 MED ORDER — GADOBUTROL 1 MMOL/ML IV SOLN
10.0000 mL | Freq: Once | INTRAVENOUS | Status: AC | PRN
  Administered 2022-11-07: 10 mL via INTRAVENOUS

## 2022-11-07 NOTE — Telephone Encounter (Signed)
Received a call from the after-hours nursing line about Dr. Antonieta Pert patient's MRI brain result which showed subacute lacunar infarct in the cerebellum and no signs of metastatic disease.  Patient notes he has been having a sense of imbalance for several months with no recent new changes in his symptoms.  Results of the MRI brain were reviewed with the patient in detail   IMPRESSION: 1. Positive for a small subacute lacunar infarct in the Right cerebellum. No associated hemorrhage or mass effect. And underlying chronic small vessel ischemic disease in the cerebral white matter and basal ganglia.   2. No metastatic disease identified.   3. Bulky degenerative anterior cervical spine spurring at C3-C4.    Plan I recommended he take a baby aspirin 81 mg p.o. daily and confirmed that he did not have any risks for bleeding. He was recommended to call Dr. Antonieta Pert office on Monday since he might need additional neurologic workup for risk factor management to prevent additional strokes. He was recommended to go to the emergency room if any of his neurologic symptoms changed or he develop new neurologic symptoms. Patient reported understanding instructions clearly.  Brunetta Genera

## 2022-11-09 ENCOUNTER — Telehealth: Payer: Self-pay | Admitting: Hematology & Oncology

## 2022-11-09 NOTE — Telephone Encounter (Signed)
I spoke with Timothy Conway this afternoon.  This was about the MRI that he had done over the weekend.  This was an MRI of the brain.  This showed that he had some chronic lacunar infarcts.  However, there was a new subacute infarct in the right cerebellum.  There is no associated hemorrhage or mass effect.  He sounded good on the phone.  He said he started taking baby aspirin yesterday.  I suspect he probably will need to have some type of neurologic follow-up.  I probably will have to speak to his family doctor about this.  I will see about getting a phone call into him tomorrow.  I told Timothy Conway to keep taking the aspirin.  I told to make sure he stays well-hydrated.  He understands this.  I do not think this has anything to do with his history of lung cancer.   Lattie Haw, MD

## 2022-11-20 DIAGNOSIS — E7800 Pure hypercholesterolemia, unspecified: Secondary | ICD-10-CM | POA: Diagnosis not present

## 2022-11-20 DIAGNOSIS — Z8673 Personal history of transient ischemic attack (TIA), and cerebral infarction without residual deficits: Secondary | ICD-10-CM | POA: Diagnosis not present

## 2022-11-20 DIAGNOSIS — I10 Essential (primary) hypertension: Secondary | ICD-10-CM | POA: Diagnosis not present

## 2022-11-20 DIAGNOSIS — R2689 Other abnormalities of gait and mobility: Secondary | ICD-10-CM | POA: Diagnosis not present

## 2022-11-25 ENCOUNTER — Encounter: Payer: Self-pay | Admitting: Family Medicine

## 2022-11-25 ENCOUNTER — Other Ambulatory Visit (HOSPITAL_COMMUNITY): Payer: Self-pay | Admitting: Family Medicine

## 2022-11-25 DIAGNOSIS — Z8673 Personal history of transient ischemic attack (TIA), and cerebral infarction without residual deficits: Secondary | ICD-10-CM

## 2022-11-26 ENCOUNTER — Other Ambulatory Visit: Payer: Self-pay

## 2022-11-26 ENCOUNTER — Other Ambulatory Visit: Payer: Self-pay | Admitting: Internal Medicine

## 2022-11-26 DIAGNOSIS — C73 Malignant neoplasm of thyroid gland: Secondary | ICD-10-CM

## 2022-11-26 MED ORDER — LEVOTHYROXINE SODIUM 125 MCG PO TABS: 125.0000 ug | ORAL_TABLET | Freq: Every day | ORAL | 0 refills | Status: DC

## 2022-12-04 ENCOUNTER — Other Ambulatory Visit: Payer: Self-pay | Admitting: Internal Medicine

## 2022-12-04 ENCOUNTER — Other Ambulatory Visit: Payer: Self-pay

## 2022-12-04 DIAGNOSIS — E890 Postprocedural hypothyroidism: Secondary | ICD-10-CM

## 2022-12-07 ENCOUNTER — Other Ambulatory Visit: Payer: Medicare HMO

## 2022-12-09 ENCOUNTER — Ambulatory Visit (HOSPITAL_BASED_OUTPATIENT_CLINIC_OR_DEPARTMENT_OTHER)
Admission: RE | Admit: 2022-12-09 | Discharge: 2022-12-09 | Disposition: A | Discharge status: Home / Self Care | Payer: Medicare HMO | Source: Ambulatory Visit | Attending: Family Medicine | Admitting: Family Medicine

## 2022-12-09 ENCOUNTER — Ambulatory Visit (HOSPITAL_COMMUNITY)
Admission: RE | Admit: 2022-12-09 | Discharge: 2022-12-09 | Disposition: A | Discharge status: Home / Self Care | Payer: Medicare HMO | Source: Ambulatory Visit | Attending: Family Medicine | Admitting: Family Medicine

## 2022-12-09 DIAGNOSIS — Z8673 Personal history of transient ischemic attack (TIA), and cerebral infarction without residual deficits: Secondary | ICD-10-CM | POA: Insufficient documentation

## 2022-12-09 DIAGNOSIS — I10 Essential (primary) hypertension: Secondary | ICD-10-CM

## 2022-12-09 LAB — ECHOCARDIOGRAM COMPLETE
Area-P 1/2: 2.92 cm2
Calc EF: 63.8 %
S' Lateral: 2.2 cm
Single Plane A2C EF: 63.6 %
Single Plane A4C EF: 64 %

## 2022-12-09 MED ORDER — PERFLUTREN LIPID MICROSPHERE
1.0000 mL | INTRAVENOUS | Status: AC | PRN
  Administered 2022-12-09: 3 mL via INTRAVENOUS

## 2022-12-09 NOTE — Progress Notes (Signed)
Carotid duplex has been completed.   Preliminary results in CV Proc.   Jinny Blossom Lugene Beougher 12/09/2022 2:14 PM

## 2022-12-09 NOTE — Progress Notes (Signed)
  Echocardiogram 2D Echocardiogram has been performed.  Timothy Conway 12/09/2022, 4:02 PM

## 2023-01-05 ENCOUNTER — Ambulatory Visit: Payer: Medicare HMO | Attending: Cardiology | Admitting: Cardiology

## 2023-01-05 ENCOUNTER — Ambulatory Visit: Payer: Medicare HMO | Attending: Cardiology

## 2023-01-05 ENCOUNTER — Encounter: Payer: Self-pay | Admitting: Cardiology

## 2023-01-05 VITALS — BP 118/72 | HR 62 | Ht 72.0 in | Wt 264.0 lb

## 2023-01-05 DIAGNOSIS — Z8673 Personal history of transient ischemic attack (TIA), and cerebral infarction without residual deficits: Secondary | ICD-10-CM | POA: Diagnosis not present

## 2023-01-05 DIAGNOSIS — I4519 Other right bundle-branch block: Secondary | ICD-10-CM

## 2023-01-05 DIAGNOSIS — C3492 Malignant neoplasm of unspecified part of left bronchus or lung: Secondary | ICD-10-CM

## 2023-01-05 DIAGNOSIS — I517 Cardiomegaly: Secondary | ICD-10-CM | POA: Diagnosis not present

## 2023-01-05 DIAGNOSIS — E785 Hyperlipidemia, unspecified: Secondary | ICD-10-CM | POA: Diagnosis not present

## 2023-01-05 DIAGNOSIS — I69398 Other sequelae of cerebral infarction: Secondary | ICD-10-CM | POA: Diagnosis not present

## 2023-01-05 DIAGNOSIS — Z87891 Personal history of nicotine dependence: Secondary | ICD-10-CM | POA: Diagnosis not present

## 2023-01-05 DIAGNOSIS — I471 Supraventricular tachycardia, unspecified: Secondary | ICD-10-CM

## 2023-01-05 DIAGNOSIS — I6930 Unspecified sequelae of cerebral infarction: Secondary | ICD-10-CM | POA: Insufficient documentation

## 2023-01-05 DIAGNOSIS — R9431 Abnormal electrocardiogram [ECG] [EKG]: Secondary | ICD-10-CM | POA: Diagnosis not present

## 2023-01-05 HISTORY — DX: Hyperlipidemia, unspecified: E78.5

## 2023-01-05 NOTE — Progress Notes (Signed)
Cardiology Consultation:    Date:  01/05/2023   ID:  Timothy Conway, DOB [birth year], MRN [id number]  PCP:  Lujean Amel, MD  Cardiologist:  Jenne Campus, MD   Referring MD: Lujean Amel, MD   Chief Complaint  Patient presents with   follow up stroke    6 months ago MRI show stroke    History of Present Illness:    Timothy Conway is a 67 y.o. male who is being seen today for the evaluation of history of CVA at the request of Koirala, Dibas, MD. past medical history significant for essential hypertension, dyslipidemia, smoking.  In the summer he started losing balance.  Apparently he also drank some alcohol he fell down few times thinking that is because of drinking but even without drinking he started falling, eventually in December he did have MRI of his brain done which showed some lacunar stroke in cerebellum.  He comes to me because he would like to be established as a patient and look for cardiac source of emboli.  He said he is doing gradually better.  He said he is walking as long as he holds something will be fine.  Otherwise doing well.  Cardiac wise denies have any palpitations there is no chest pain tightness squeezing pressure burning chest.  He did have echocardiogram performed which showed some asymmetrical septal hypertrophy however there is no mentioning gradient.  He never had episode of atrial fibrillation.  He never had any heart trouble.  He quit smoking 1-1/2 years ago.  He does have a lung cancer, status post radiation apparently stable.  He was then asked to be cancer free for the last 2 years.  Past Medical History:  Diagnosis Date   Adenocarcinoma, lung, left ([place]) 07/05/2017   Age-related osteoporosis without current pathological fracture 12/12/2021   Anxiety    COPD (chronic obstructive pulmonary disease) (HCC)    Depression    Dry eye syndrome    Dysrhythmia    hx of PAC's   Effusion of elbow joint, right 06/29/2017   Gastric ulcer with  perforation but without obstruction ([place]) 04/15/2016   GERD (gastroesophageal reflux disease)    Gout    Hyperlipidemia    Hypertension    Hypothyroidism    Papillary thyroid carcinoma s/p total thyroidectomy & CLND 08/27/2017 08/24/2017   Postsurgical hypothyroidism 10/01/2017   Thyroid cancer ([place])    2018    Past Surgical History:  Procedure Laterality Date   ABDOMINAL SURGERY     hemmoraging ulcer   BRONCHIAL BIOPSY  06/25/2020   Procedure: BRONCHIAL BIOPSIES;  Surgeon: Collene Gobble, MD;  Location: [facility];  Service: Pulmonary;;   BRONCHIAL BRUSHINGS  06/25/2020   Procedure: BRONCHIAL BRUSHINGS;  Surgeon: Collene Gobble, MD;  Location: [facility] ENDOSCOPY;  Service: Pulmonary;;   BRONCHIAL NEEDLE ASPIRATION BIOPSY  06/25/2020   Procedure: BRONCHIAL NEEDLE ASPIRATION BIOPSIES;  Surgeon: Collene Gobble, MD;  Location[facility] ENDOSCOPY;  Service: Pulmonary;;   BRONCHIAL WASHINGS  06/25/2020   Procedure: BRONCHIAL WASHINGS;  Surgeon: Collene Gobble, MD;  Location: [facility] ENDOSCOPY;  Service: Pulmonary;;   CATARACT EXTRACTION Right    COLONOSCOPY     EYE SURGERY Right    x 4  between ages 43-13   FIDUCIAL MARKER PLACEMENT  06/25/2020   Procedure: FIDUCIAL MARKER PLACEMENT;  Surgeon: Collene Gobble, MD;  Location: [facility] ENDOSCOPY;  Service: Pulmonary;;   THYROIDECTOMY N/A 08/27/2017   Procedure: TOTAL THYROIDECTOMY WITH LIMITED LYMPH NODE DISSECTION;  Surgeon: Armandina Gemma, MD;  Location: [facility] ORS;  Service: General;  Laterality: N/A;   UPPER GI ENDOSCOPY     VIDEO BRONCHOSCOPY WITH ENDOBRONCHIAL NAVIGATION N/A 06/25/2020   Procedure: VIDEO BRONCHOSCOPY WITH ENDOBRONCHIAL NAVIGATION;  Surgeon: Collene Gobble, MD;  Location: [facility] ENDOSCOPY;  Service: Pulmonary;  Laterality: N/A;    Current Medications: Current Meds  Medication Sig   alendronate (FOSAMAX) 70 MG tablet Take 1 tablet (70 mg total) by mouth every 7 (seven) days. Take with a full glass of water on an empty stomach.   alprazolam (XANAX) 2  MG tablet Take 2 mg by mouth 3 (three) times daily as needed (anxiety.). Pt taking 1/2 to 1 pill three times a day as needed.   ARIPiprazole (ABILIFY) 20 MG tablet Take 10 mg by mouth at bedtime.   atenolol (TENORMIN) 100 MG tablet Take 100 mg by mouth 2 (two) times daily.   CALCIUM PO Take 1 tablet by mouth daily.   Cholecalciferol (VITAMIN D) 2000 units tablet Take 2,000 Units by mouth daily.   fenofibrate 160 MG tablet Take 160 mg by mouth daily with supper.    hydrocortisone 2.5 % cream Apply 1 application  topically 2 (two) times daily as needed (rash).   levothyroxine (SYNTHROID) 125 MCG tablet Take 1 tablet (125 mcg total) by mouth daily.   Multiple Vitamin (MULTIVITAMIN WITH MINERALS) TABS tablet Take 1 tablet by mouth daily.   omeprazole (PRILOSEC) 20 MG capsule Take 20 mg by mouth daily before breakfast.    rosuvastatin (CRESTOR) 10 MG tablet Take 10 mg by mouth daily.    traZODone (DESYREL) 50 MG tablet Take 50-150 mg by mouth at bedtime as needed for sleep.     Allergies:   Citalopram and Trazodone   Social History   Socioeconomic History   Marital status: Single    Spouse name: Not on file   Number of children: Not on file   Years of education: Not on file   Highest education level: Not on file  Occupational History   Not on file  Tobacco Use   Smoking status: Former    Packs/day: 1.00    Years: 40.00    Total pack years: 40.00    Types: Cigarettes    Quit date: 05/2019    Years since quitting: 3.6   Smokeless tobacco: Never  Vaping Use   Vaping Use: Never used  Substance and Sexual Activity   Alcohol use: Not Currently    Alcohol/week: 0.0 standard drinks of alcohol    Comment: Used to drink a lot -moderate - quit 2020   Drug use: Not Currently    Types: Marijuana    Comment: last use years ago   Sexual activity: Not Currently  Other Topics Concern   Not on file  Social History Narrative   Not on file   Social Determinants of Health   Financial  Resource Strain: Not on file  Food Insecurity: Not on file  Transportation Needs: Not on file  Physical Activity: Not on file  Stress: Not on file  Social Connections: Not on file     Family History: The patient's family history is not on file. He was adopted. ROS:   Please see the history of present illness.    All 14 point review of systems negative except as described per history of present illness.  EKGs/Labs/Other Studies Reviewed:    The following studies were reviewed today: I did review her MRI for this visit  EKG:  EKG is  ordered today.  The ekg ordered today demonstrates normal sinus rhythm, incomplete right bundle branch block, evidence of right ventricle hypertrophy.  Recent Labs: 06/05/2022: TSH 1.60 10/16/2022: ALT 11; BUN 16; Creatinine 1.27; Hemoglobin 16.2; Platelet Count 217; Potassium 4.6; Sodium 142  Recent Lipid Panel No results found for: "CHOL", "TRIG", "HDL", "CHOLHDL", "VLDL", "LDLCALC", "LDLDIRECT"  Physical Exam:    VS:  BP 118/72 (BP Location: Right Arm, Patient Position: Sitting)   Pulse 62   Ht 6' (1.829 m)   Wt 264 lb (119.7 kg)   SpO2 94%   BMI 35.80 kg/m     Wt Readings from Last 3 Encounters:  01/05/23 264 lb (119.7 kg)  10/16/22 266 lb (120.7 kg)  06/05/22 277 lb 3.2 oz (125.7 kg)     GEN:  Well nourished, well developed in no acute distress HEENT: Normal NECK: No JVD; No carotid bruits LYMPHATICS: No lymphadenopathy CARDIAC: RRR, no murmurs, no rubs, no gallops RESPIRATORY:  Clear to auscultation without rales, wheezing or rhonchi  ABDOMEN: Soft, non-tender, non-distended MUSCULOSKELETAL:  No edema; No deformity  SKIN: Warm and dry NEUROLOGIC:  Alert and oriented x 3 PSYCHIATRIC:  Normal affect   ASSESSMENT:    1. Personal history of transient cerebral ischemia   2. Late effect of cerebrovascular accident (CVA)   3. Dyslipidemia   4. History of smoking   5. Adenocarcinoma, lung, left ([place])    PLAN:    In order of  problems listed above:  History of CVA.  This is a lacunar stroke, this is unlikely embolic event.  Usually lacunar strokes are related to problem in situ within the artery.  And management of this problem should include antiplatelets therapy that he is already on as well as reduction of risk factors including cholesterol.  He is being excellently managed by his primary care physician.  Recently dose of Crestor has been increased to 10 mg daily we will make arrangements later to check his fasting lipid profile.  I would like to see his LDL less than 70.  Even though it is less likely to be embolic I still think it would be reasonable to check him for possibility of atrial fibrillation, therefore, I will ask him to wear Zio patch for 2 weeks to see if he got in atrial fibrillation. Lung adenocarcinoma apparently stable. Dyslipidemia: Discussion as above Carotic arterial disease noted, he is already on appropriate medications.  No need to do anything mechanically. I was chaperoned by my nurse Jacobo Forest during all visit  Medication Adjustments/Labs and Tests Ordered: Current medicines are reviewed at length with the patient today.  Concerns regarding medicines are outlined above.  Orders Placed This Encounter  Procedures   LONG TERM MONITOR (3-14 DAYS)   No orders of the defined types were placed in this encounter.   Signed, Park Liter, MD, [facility]. 01/05/2023 11:30 AM    [facility] physician for entire appt. Suann Larry. Kenton Kingfisher, RN

## 2023-01-05 NOTE — Patient Instructions (Signed)
Medication Instructions:  Your physician recommends that you continue on your current medications as directed. Please refer to the Current Medication list given to you today.  *If you need a refill on your cardiac medications before your next appointment, please call your pharmacy*   Lab Work: None Ordered If you have labs (blood work) drawn today and your tests are completely normal, you will receive your results only by: [place] (if you have MyChart) OR A paper copy in the mail If you have any lab test that is abnormal or we need to change your treatment, we will call you to review the results.   Testing/Procedures:  WHY IS MY DOCTOR PRESCRIBING ZIO? The Zio system is proven and trusted by physicians to detect and diagnose irregular heart rhythms -- and has been prescribed to hundreds of thousands of patients.  The FDA has cleared the Zio system to monitor for many different kinds of irregular heart rhythms. In a study, physicians were able to reach a diagnosis 90% of the time with the Zio system1.  You can wear the Zio monitor -- a small, discreet, comfortable patch -- during your normal day-to-day activity, including while you sleep, shower, and exercise, while it records every single heartbeat for analysis.  1Barrett, P., et al. Comparison of 24 Hour Holter Monitoring Versus 14 Day Novel Adhesive Patch Electrocardiographic Monitoring. [place], 2014.  ZIO VS. HOLTER MONITORING The Zio monitor can be comfortably worn for up to 14 days. Holter monitors can be worn for 24 to 48 hours, limiting the time to record any irregular heart rhythms you may have. Zio is able to capture data for the 51% of patients who have their first symptom-triggered arrhythmia after 48 hours.1  LIVE WITHOUT RESTRICTIONS The Zio ambulatory cardiac monitor is a small, unobtrusive, and water-resistant patch--you might even forget you're wearing it. The Zio monitor records and stores  every beat of your heart, whether you're sleeping, working out, or showering.     Follow-Up: At [facility], you and your health needs are our priority.  As part of our continuing mission to provide you with exceptional heart care, we have created designated Provider Care Teams.  These Care Teams include your primary Cardiologist (physician) and Advanced Practice Providers (APPs -  Physician Assistants and Nurse Practitioners) who all work together to provide you with the care you need, when you need it.  We recommend signing up for the patient portal called "MyChart".  Sign up information is provided on this After Visit Summary.  MyChart is used to connect with patients for Virtual Visits (Telemedicine).  Patients are able to view lab/test results, encounter notes, upcoming appointments, etc.  Non-urgent messages can be sent to your provider as well.   To learn more about what you can do with MyChart, go to [email or web address].    Your next appointment:   3 month(s)  The format for your next appointment:   In Person  Provider:   Jenne Campus, MD    Other Instructions NA

## 2023-01-18 ENCOUNTER — Ambulatory Visit: Payer: Medicare HMO | Admitting: Neurology

## 2023-02-04 ENCOUNTER — Ambulatory Visit: Payer: Medicare HMO | Admitting: Neurology

## 2023-02-05 DIAGNOSIS — Z8673 Personal history of transient ischemic attack (TIA), and cerebral infarction without residual deficits: Secondary | ICD-10-CM | POA: Diagnosis not present

## 2023-02-18 ENCOUNTER — Ambulatory Visit: Payer: Medicare HMO | Admitting: Neurology

## 2023-02-18 ENCOUNTER — Encounter: Payer: Self-pay | Admitting: Neurology

## 2023-02-18 VITALS — BP 134/67 | HR 60 | Ht 72.0 in | Wt 264.0 lb

## 2023-02-18 DIAGNOSIS — Z9189 Other specified personal risk factors, not elsewhere classified: Secondary | ICD-10-CM

## 2023-02-18 DIAGNOSIS — R911 Solitary pulmonary nodule: Secondary | ICD-10-CM

## 2023-02-18 DIAGNOSIS — G3184 Mild cognitive impairment, so stated: Secondary | ICD-10-CM

## 2023-02-18 DIAGNOSIS — M5124 Other intervertebral disc displacement, thoracic region: Secondary | ICD-10-CM

## 2023-02-18 DIAGNOSIS — Z8711 Personal history of peptic ulcer disease: Secondary | ICD-10-CM | POA: Insufficient documentation

## 2023-02-18 DIAGNOSIS — I10 Essential (primary) hypertension: Secondary | ICD-10-CM | POA: Insufficient documentation

## 2023-02-18 DIAGNOSIS — M545 Low back pain, unspecified: Secondary | ICD-10-CM | POA: Insufficient documentation

## 2023-02-18 DIAGNOSIS — Z5181 Encounter for therapeutic drug level monitoring: Secondary | ICD-10-CM | POA: Diagnosis not present

## 2023-02-18 DIAGNOSIS — R799 Abnormal finding of blood chemistry, unspecified: Secondary | ICD-10-CM | POA: Diagnosis not present

## 2023-02-18 DIAGNOSIS — E559 Vitamin D deficiency, unspecified: Secondary | ICD-10-CM | POA: Insufficient documentation

## 2023-02-18 DIAGNOSIS — R2689 Other abnormalities of gait and mobility: Secondary | ICD-10-CM

## 2023-02-18 DIAGNOSIS — L219 Seborrheic dermatitis, unspecified: Secondary | ICD-10-CM | POA: Insufficient documentation

## 2023-02-18 DIAGNOSIS — I639 Cerebral infarction, unspecified: Secondary | ICD-10-CM | POA: Diagnosis not present

## 2023-02-18 DIAGNOSIS — G4719 Other hypersomnia: Secondary | ICD-10-CM | POA: Insufficient documentation

## 2023-02-18 DIAGNOSIS — Z85850 Personal history of malignant neoplasm of thyroid: Secondary | ICD-10-CM | POA: Insufficient documentation

## 2023-02-18 DIAGNOSIS — F329 Major depressive disorder, single episode, unspecified: Secondary | ICD-10-CM | POA: Insufficient documentation

## 2023-02-18 DIAGNOSIS — I719 Aortic aneurysm of unspecified site, without rupture: Secondary | ICD-10-CM | POA: Insufficient documentation

## 2023-02-18 DIAGNOSIS — G478 Other sleep disorders: Secondary | ICD-10-CM | POA: Insufficient documentation

## 2023-02-18 DIAGNOSIS — G4733 Obstructive sleep apnea (adult) (pediatric): Secondary | ICD-10-CM | POA: Insufficient documentation

## 2023-02-18 DIAGNOSIS — Z0389 Encounter for observation for other suspected diseases and conditions ruled out: Secondary | ICD-10-CM | POA: Diagnosis not present

## 2023-02-18 DIAGNOSIS — R413 Other amnesia: Secondary | ICD-10-CM

## 2023-02-18 DIAGNOSIS — E7800 Pure hypercholesterolemia, unspecified: Secondary | ICD-10-CM | POA: Insufficient documentation

## 2023-02-18 HISTORY — DX: Obstructive sleep apnea (adult) (pediatric): G47.33

## 2023-02-18 HISTORY — DX: Aortic aneurysm of unspecified site, without rupture: I71.9

## 2023-02-18 HISTORY — DX: Other sleep disorders: G47.8

## 2023-02-18 HISTORY — DX: Solitary pulmonary nodule: R91.1

## 2023-02-18 HISTORY — DX: Seborrheic dermatitis, unspecified: L21.9

## 2023-02-18 HISTORY — DX: Essential (primary) hypertension: I10

## 2023-02-18 HISTORY — DX: Other intervertebral disc displacement, thoracic region: M51.24

## 2023-02-18 NOTE — Patient Instructions (Signed)
I had a long d/w patient about his recent cerebellar stroke, risk for recurrent stroke/TIAs, personally independently reviewed imaging studies and stroke evaluation results and answered questions.Continue aspirin 81 mg daily  for secondary stroke prevention and maintain strict control of hypertension with blood pressure goal below 130/90, diabetes with hemoglobin A1c goal below 6.5% and lipids with LDL cholesterol goal below 70 mg/dL. I also advised the patient to eat a healthy diet with plenty of whole grains, cereals, fruits and vegetables, exercise regularly and maintain ideal body weight .check CT angiogram of the brain and neck, lipid profile, hemoglobin A1c.  Refer to outpatient physical Occupational Therapy to improve gait and balance.  Referral for polysomnogram for sleep apnea and to cardiac electrophysiology for loop recorder for paroxysmal A-fib.  We also discussed memory compensation strategies and advised him to increase participation in cognitively standing and playing bridge and sudoku.  Followup in the future with me in 4 months with my nurse practitioner or call earlier if necessary.  Stroke Prevention Some medical conditions and behaviors can lead to a higher chance of having a stroke. You can help prevent a stroke by eating healthy, exercising, not smoking, and managing any medical conditions you have. Stroke is a leading cause of functional impairment. Primary prevention is particularly important because a majority of strokes are first-time events. Stroke changes the lives of not only those who experience a stroke but also their family and other caregivers. How can this condition affect me? A stroke is a medical emergency and should be treated right away. A stroke can lead to brain damage and can sometimes be life-threatening. If a person gets medical treatment right away, there is a better chance of surviving and recovering from a stroke. What can increase my risk? The following medical  conditions may increase your risk of a stroke: Cardiovascular disease. High blood pressure (hypertension). Diabetes. High cholesterol. Sickle cell disease. Blood clotting disorders (hypercoagulable state). Obesity. Sleep disorders (obstructive sleep apnea). Other risk factors include: Being older than age 79. Having a history of blood clots, stroke, or mini-stroke (transient ischemic attack, TIA). Genetic factors, such as race, ethnicity, or a family history of stroke. Smoking cigarettes or using other tobacco products. Taking birth control pills, especially if you also use tobacco. Heavy use of alcohol or drugs, especially cocaine and methamphetamine. Physical inactivity. What actions can I take to prevent this? Manage your health conditions High cholesterol levels. Eating a healthy diet is important for preventing high cholesterol. If cholesterol cannot be managed through diet alone, you may need to take medicines. Take any prescribed medicines to control your cholesterol as told by your health care provider. Hypertension. To reduce your risk of stroke, try to keep your blood pressure below 130/80. Eating a healthy diet and exercising regularly are important for controlling blood pressure. If these steps are not enough to manage your blood pressure, you may need to take medicines. Take any prescribed medicines to control hypertension as told by your health care provider. Ask your health care provider if you should monitor your blood pressure at home. Have your blood pressure checked every year, even if your blood pressure is normal. Blood pressure increases with age and some medical conditions. Diabetes. Eating a healthy diet and exercising regularly are important parts of managing your blood sugar (glucose). If your blood sugar cannot be managed through diet and exercise, you may need to take medicines. Take any prescribed medicines to control your diabetes as told by your health  care  provider. Get evaluated for obstructive sleep apnea. Talk to your health care provider about getting a sleep evaluation if you snore a lot or have excessive sleepiness. Make sure that any other medical conditions you have, such as atrial fibrillation or atherosclerosis, are managed. Nutrition Follow instructions from your health care provider about what to eat or drink to help manage your health condition. These instructions may include: Reducing your daily calorie intake. Limiting how much salt (sodium) you use to 1,500 milligrams (mg) each day. Using only healthy fats for cooking, such as olive oil, canola oil, or sunflower oil. Eating healthy foods. You can do this by: Choosing foods that are high in fiber, such as whole grains, and fresh fruits and vegetables. Eating at least 5 servings of fruits and vegetables a day. Try to fill one-half of your plate with fruits and vegetables at each meal. Choosing lean protein foods, such as lean cuts of meat, poultry without skin, fish, tofu, beans, and nuts. Eating low-fat dairy products. Avoiding foods that are high in sodium. This can help lower blood pressure. Avoiding foods that have saturated fat, trans fat, and cholesterol. This can help prevent high cholesterol. Avoiding processed and prepared foods. Counting your daily carbohydrate intake.  Lifestyle If you drink alcohol: Limit how much you have to: 0-1 drink a day for women who are not pregnant. 0-2 drinks a day for men. Know how much alcohol is in your drink. In the [place] one drink equals one 12 oz bottle of beer (332mL), one 5 oz glass of wine (160mL), or one 1 oz glass of hard liquor (48mL). Do not use any products that contain nicotine or tobacco. These products include cigarettes, chewing tobacco, and vaping devices, such as e-cigarettes. If you need help quitting, ask your health care provider. Avoid secondhand smoke. Do not use drugs. Activity  Try to stay at a healthy  weight. Get at least 30 minutes of exercise on most days, such as: Fast walking. Biking. Swimming. Medicines Take over-the-counter and prescription medicines only as told by your health care provider. Aspirin or blood thinners (antiplatelets or anticoagulants) may be recommended to reduce your risk of forming blood clots that can lead to stroke. Avoid taking birth control pills. Talk to your health care provider about the risks of taking birth control pills if: You are over 30 years old. You smoke. You get very bad headaches. You have had a blood clot. Where to find more information American Stroke Association: www.strokeassociation.org Get help right away if: You or a loved one has any symptoms of a stroke. "BE FAST" is an easy way to remember the main warning signs of a stroke: B - Balance. Signs are dizziness, sudden trouble walking, or loss of balance. E - Eyes. Signs are trouble seeing or a sudden change in vision. F - Face. Signs are sudden weakness or numbness of the face, or the face or eyelid drooping on one side. A - Arms. Signs are weakness or numbness in an arm. This happens suddenly and usually on one side of the body. S - Speech. Signs are sudden trouble speaking, slurred speech, or trouble understanding what people say. T - Time. Time to call emergency services. Write down what time symptoms started. You or a loved one has other signs of a stroke, such as: A sudden, severe headache with no known cause. Nausea or vomiting. Seizure. These symptoms may represent a serious problem that is an emergency. Do not wait to see if the symptoms  will go away. Get medical help right away. Call your local emergency services (911 in the [place] Do not drive yourself to the hospital. Summary You can help to prevent a stroke by eating healthy, exercising, not smoking, limiting alcohol intake, and managing any medical conditions you may have. Do not use any products that contain nicotine or  tobacco. These include cigarettes, chewing tobacco, and vaping devices, such as e-cigarettes. If you need help quitting, ask your health care provider. Remember "BE FAST" for warning signs of a stroke. Get help right away if you or a loved one has any of these signs. This information is not intended to replace advice given to you by your health care provider. Make sure you discuss any questions you have with your health care provider. Document Revised: 05/30/2020 Document Reviewed: 06/17/2020 Elsevier Patient Education  [employer] Strategies  Use "WARM" strategy.  W= write it down  A= associate it  R= repeat it  M= make a mental note  2.   You can keep a Social worker.  Use a 3-ring notebook with sections for the following: calendar, important names and phone numbers,  medications, doctors' names/phone numbers, lists/reminders, and a section to journal what you did  each day.   3.    Use a calendar to write appointments down.  4.    Write yourself a schedule for the day.  This can be placed on the calendar or in a separate section of the Memory Notebook.  Keeping a  regular schedule can help memory.  5.    Use medication organizer with sections for each day or morning/evening pills.  You may need help loading it  6.    Keep a basket, or pegboard by the door.  Place items that you need to take out with you in the basket or on the pegboard.  You may also want to  include a message board for reminders.  7.    Use sticky notes.  Place sticky notes with reminders in a place where the task is performed.  For example: " turn off the  stove" placed by the stove, "lock the door" placed on the door at eye level, " take your medications" on  the bathroom mirror or by the place where you normally take your medications.  8.    Use alarms/timers.  Use while cooking to remind yourself to check on food or as a reminder to take your medicine, or as a  reminder to make a  call, or as a reminder to perform another task, etc.

## 2023-02-18 NOTE — Progress Notes (Signed)
[facility] [address] [place]. [place] [address] [phone]       OFFICE CONSULT NOTE  [name] Date of Birth:  [birth year] Medical Record Number:  [id number]   Referring MD: Dibas Koirala  Reason for Referral: Stroke  HPI: Timothy Conway is a 67 year old Caucasian male seen today for initial office consultation visit for stroke.  History is obtained from the patient and review of collateral notes and electronic medical records.  I have personally reviewed pertinent available imaging films in PACS.  He has past medical history of hypertension, hyperlipidemia, obstructive sleep apnea, depression, COPD, lung adenocarcinoma, gastroesophageal reflux disease, gout, thyroid cancer.  Patient states for the last 6 months or so his benign send gait and balance difficulties.  He states this began suddenly 1 day when he woke up.  Trouble getting out of bed and he fell down.  He was told that he was walking like a drunk.  He did not seek medical help at that time.  The last few months he feels his gait and balance are improving.  He underwent MRI scan of the brain on 11/07/2022 which showed a 1 cm right cerebellar cortical and white matter acute infarct as well as remote age bilateral basal ganglia and corona radiata lacunar infarcts.  There is mild changes of small vessel disease.  Patient started on aspirin for stroke prevention since which is tolerating well without bruising or bleeding.  He is also on Crestor tolerating well.  His blood pressure is well-controlled today it is 130/67.  He states that he never been diagnosed with sleep apnea though I find the diagnosis listed past medical history.  He has had no further strokelike symptoms.  Denies any history of syncopal events, palpitations or atrial fibrillation.  He does admit to mild short-term memory and cognitive difficulties.  He lives alone and manages a normal.  Echocardiogram on 11/2522 showed ejection fraction of 65%.   There is mild left atrial dilatation.  Carotid ultrasound showed no significant extracranial stenosis bilaterally.  4 weeks cardiac external monitor showed no evidence of atrial fibrillation with brief runs of supraventricular tachycardia and isolated supraventricular ectopics.  ROS:   14 system review of systems is positive for dizziness, imbalance, fall, memory difficulties and all other systems negative  PMH:  Past Medical History:  Diagnosis Date   Adenocarcinoma, lung, left ([place]) 07/05/2017   Age-related osteoporosis without current pathological fracture 12/12/2021   Anxiety    COPD (chronic obstructive pulmonary disease) (HCC)    Depression    Dry eye syndrome    Dysrhythmia    hx of PAC's   Effusion of elbow joint, right 06/29/2017   Gastric ulcer with perforation but without obstruction ([place]) 04/15/2016   GERD (gastroesophageal reflux disease)    Gout    Hyperlipidemia    Hypertension    Hypothyroidism    OSA (obstructive sleep apnea) 02/18/2023   Papillary thyroid carcinoma s/p total thyroidectomy & CLND 08/27/2017 08/24/2017   Postsurgical hypothyroidism 10/01/2017   Thyroid cancer ([place])    2018    Social History:  Social History   Socioeconomic History   Marital status: Single    Spouse name: Not on file   Number of children: Not on file   Years of education: Not on file   Highest education level: Not on file  Occupational History   Not on file  Tobacco Use   Smoking status: Former    Packs/day: 1.00    Years:  40.00    Additional pack years: 0.00    Total pack years: 40.00    Types: Cigarettes    Quit date: 05/2019    Years since quitting: 3.8   Smokeless tobacco: Never  Vaping Use   Vaping Use: Never used  Substance and Sexual Activity   Alcohol use: Not Currently    Alcohol/week: 0.0 standard drinks of alcohol    Comment: Used to drink a lot -moderate - quit 2020   Drug use: Not Currently    Types: Marijuana    Comment: last use years ago   Sexual  activity: Not Currently  Other Topics Concern   Not on file  Social History Narrative   Not on file   Social Determinants of Health   Financial Resource Strain: Not on file  Food Insecurity: Not on file  Transportation Needs: Not on file  Physical Activity: Not on file  Stress: Not on file  Social Connections: Not on file  Intimate Partner Violence: Not on file    Medications:   Current Outpatient Medications on File Prior to Visit  Medication Sig Dispense Refill   alendronate (FOSAMAX) 70 MG tablet Take 1 tablet (70 mg total) by mouth every 7 (seven) days. Take with a full glass of water on an empty stomach. 12 tablet 3   alprazolam (XANAX) 2 MG tablet Take 2 mg by mouth 3 (three) times daily as needed (anxiety.). Pt taking 1/2 to 1 pill three times a day as needed.     ARIPiprazole (ABILIFY) 20 MG tablet Take 10 mg by mouth at bedtime.     atenolol (TENORMIN) 100 MG tablet Take 100 mg by mouth 2 (two) times daily.     CALCIUM PO Take 1 tablet by mouth daily.     Cholecalciferol (VITAMIN D) 2000 units tablet Take 2,000 Units by mouth daily.     fenofibrate 160 MG tablet Take 160 mg by mouth daily with supper.      hydrocortisone 2.5 % cream Apply 1 application  topically 2 (two) times daily as needed (rash).     levothyroxine (SYNTHROID) 125 MCG tablet Take 1 tablet (125 mcg total) by mouth daily. 30 tablet 0   Multiple Vitamin (MULTIVITAMIN WITH MINERALS) TABS tablet Take 1 tablet by mouth daily.     omeprazole (PRILOSEC) 20 MG capsule Take 20 mg by mouth daily before breakfast.      rosuvastatin (CRESTOR) 10 MG tablet Take 10 mg by mouth daily.      No current facility-administered medications on file prior to visit.    Allergies:   Allergies  Allergen Reactions   Citalopram     Other reaction(s): felt unwell   Trazodone     Other reaction(s): restless leg    Physical Exam General: Obese unkempt middle-aged Caucasian male, seated, in no evident distress Head: head  normocephalic and atraumatic.   Neck: supple with no carotid or supraclavicular bruits Cardiovascular: regular rate and rhythm, no murmurs Musculoskeletal: no deformity Skin:  no rash/petichiae Vascular:  Normal pulses all extremities  Neurologic Exam Mental Status: Awake and fully alert. Oriented to place and time. Recent and remote memory intact. Attention span, concentration and fund of knowledge appropriate. Mood and affect appropriate.  Intact recall 3/3.  Able to name only 8 animals which can walk on 4 legs.  Clock drawing  3/4.  Mini-Mental status exam not done Cranial Nerves: Fundoscopic exam reveals sharp disc margins.  Patient is blind in the right eye, [place] eye  injury.  Extraocular movements full without nystagmus. Visual fields full to confrontation in the left eye.[place] Kitchen Hearing intact. Facial sensation intact. Face, tongue, palate moves normally and symmetrically.  Motor: Normal bulk and tone. Normal strength in all tested extremity muscles. Sensory.: intact to touch , pinprick , position and vibratory sensation.  Coordination: Rapid alternating movements normal in all extremities. Finger-to-nose and heel-to-shin performed accurately bilaterally.  Unsteady when standing on either foot unsupported and on a narrow base. Gait and Station: Arises from chair without difficulty. Stance is normal. Gait is slightly broad-based demonstrates normal stride length and balance .  Not able to heel, toe and tandem walk   Reflexes: 1+ and symmetric. Toes downgoing.   NIHSS  0 Modified Rankin  1   ASSESSMENT: 67 year old Caucasian male with subacute balance and gait difficulties for the last 6 months with abnormal MRI scan showing a subacute right cerebellar infarct in December 2023 as well as bilateral subcortical lacunar of remote age.  Vascular risk factors of obesity, hyperlipidemia, hypertension, at risk for obstructive sleep apnea and silent cerebral infarcts.  He also has mild cognitive  impairment     PLAN:I had a long d/w patient about his recent cerebellar stroke, risk for recurrent stroke/TIAs, personally independently reviewed imaging studies and stroke evaluation results and answered questions.Continue aspirin 81 mg daily  for secondary stroke prevention and maintain strict control of hypertension with blood pressure goal below 130/90, diabetes with hemoglobin A1c goal below 6.5% and lipids with LDL cholesterol goal below 70 mg/dL. I also advised the patient to eat a healthy diet with plenty of whole grains, cereals, fruits and vegetables, exercise regularly and maintain ideal body weight .check CT angiogram of the brain and neck, lipid profile, hemoglobin A1c.  Refer to outpatient physical Occupational Therapy to improve gait and balance.  Referral for polysomnogram for sleep apnea and to cardiac electrophysiology for loop recorder for paroxysmal A-fib.  We also discussed memory compensation strategies and advised him to increase participation in cognitively standing and playing bridge and sudoku.  Followup in the future with me in 4 months with my nurse practitioner or call earlier if necessary.  Greater than 50% time during this prolonged 60-minute consultation) for counseling and coordination of care about a cerebellar stroke, silent lacunar strokes and mild cognitive impairment and answering questions  Antony Contras, MD Note: This document was prepared with digital dictation and possible smart phrase technology. Any transcriptional errors that result from this process are unintentional.

## 2023-02-19 NOTE — Progress Notes (Signed)
Kindly inform the patient that cholesterol profile is borderline but acceptable and screening test for diabetes is satisfactory.  Most of the lab work for reversible causes of memory loss are back and okay except homocystine is not back yet

## 2023-02-21 LAB — DEMENTIA PANEL
Homocysteine: 12 umol/L (ref 0.0–17.2)
RPR Ser Ql: NONREACTIVE
TSH: 0.602 u[IU]/mL (ref 0.450–4.500)
Vitamin B-12: 394 pg/mL (ref 232–1245)

## 2023-02-21 LAB — LIPID PANEL
Chol/HDL Ratio: 3.3 ratio (ref 0.0–5.0)
Cholesterol, Total: 149 mg/dL (ref 100–199)
HDL: 45 mg/dL (ref >39)
LDL Chol Calc (NIH): 75 mg/dL (ref 0–99)
Triglycerides: 167 mg/dL — ABNORMAL HIGH (ref 0–149)
VLDL Cholesterol Cal: 29 mg/dL (ref 5–40)

## 2023-02-21 LAB — HEMOGLOBIN A1C
Est. average glucose Bld gHb Est-mCnc: 111 mg/dL
Hgb A1c MFr Bld: 5.5 % (ref 4.8–5.6)

## 2023-02-23 ENCOUNTER — Telehealth: Payer: Self-pay | Admitting: Neurology

## 2023-02-23 NOTE — Telephone Encounter (Signed)
[facility] Staggers: [id number] exp. 02/23/23-03/25/23 sent to GI [id number]

## 2023-03-01 DIAGNOSIS — I471 Supraventricular tachycardia, unspecified: Secondary | ICD-10-CM | POA: Diagnosis not present

## 2023-03-01 NOTE — Telephone Encounter (Signed)
Patient left a voice mail that he would like these sent to [facility].

## 2023-03-03 ENCOUNTER — Telehealth: Payer: Self-pay | Admitting: Cardiology

## 2023-03-03 ENCOUNTER — Ambulatory Visit: Payer: Medicare HMO | Admitting: Physical Therapy

## 2023-03-03 ENCOUNTER — Ambulatory Visit: Payer: Medicare HMO | Admitting: Occupational Therapy

## 2023-03-03 DIAGNOSIS — R413 Other amnesia: Secondary | ICD-10-CM

## 2023-03-03 NOTE — Telephone Encounter (Signed)
Patient wants to follow-up on if he will need to get an implant.

## 2023-03-03 NOTE — Telephone Encounter (Signed)
LVM for pt to return call regarding message.

## 2023-03-03 NOTE — Telephone Encounter (Signed)
The patient called [facility] this morning and got scheduled there for 04/06/23.  I changed the location to GI on the [employer] auths. The [employer] auths expire on 4/25, GI will let me know if I need to do a new one before his appointment.

## 2023-03-03 NOTE — Telephone Encounter (Signed)
CTA head auth from [employer]: Authorization [id number]  Tracking [id number]  Expires 03/03/23-04/30/23 for [place] CTA neck auth from [employer]: Authorization [id number]  Tracking [id number] Expires 03/03/23-04/30/23  [phone]

## 2023-03-04 NOTE — Telephone Encounter (Signed)
Spoke with pt returning his call. He is wanting his CT in [place] instead of [place]. Advised that Dr. Agustin Cree did not order the CT scan and that we would sent the message to Dr. Leonie Man. Also reviewed results of Zio with pt. He verbalized understanding and had no further questions.

## 2023-03-05 ENCOUNTER — Telehealth: Payer: Self-pay

## 2023-03-05 NOTE — Telephone Encounter (Signed)
Spoke with pt regarding monitor results. He verbalized understanding and had no further questions.

## 2023-03-10 ENCOUNTER — Telehealth: Payer: Self-pay

## 2023-03-10 NOTE — Telephone Encounter (Signed)
Opened chart by mistake.

## 2023-03-11 NOTE — Telephone Encounter (Signed)
You have to reorder. Can't change the location if its already placed

## 2023-03-11 NOTE — Therapy (Addendum)
OUTPATIENT OCCUPATIONAL THERAPY NEURO EVALUATION  Patient Name: Timothy LoftJonathan R Brownlow MRN: [id number] DOB:03/10/1956, 67 y.o., male Today's Date: 03/15/2023  PCP: Dr. Docia ChuckKoirala REFERRING PROVIDER: Dr. Pearlean BrownieSethi  END OF SESSION:  OT End of Session - 03/15/23 1108     Visit Number 1    Number of Visits 9    Date for OT Re-Evaluation 05/17/23    Authorization Type [employer]    Authorization Time Period 9 weeks    Authorization - Visit Number 1    Progress Note Due on Visit 10    OT Start Time 1105    OT Stop Time 1145    OT Time Calculation (min) 40 min    Activity Tolerance Patient tolerated treatment well    Behavior During Therapy WFL for tasks assessed/performed             Past Medical History:  Diagnosis Date   Adenocarcinoma, lung, left (HCC) 07/05/2017   Age-related osteoporosis without current pathological fracture 12/12/2021   Anxiety    COPD (chronic obstructive pulmonary disease) (HCC)    Depression    Dry eye syndrome    Dysrhythmia    hx of PAC's   Effusion of elbow joint, right 06/29/2017   Gastric ulcer with perforation but without obstruction (HCC) 04/15/2016   GERD (gastroesophageal reflux disease)    Gout    Hyperlipidemia    Hypertension    Hypothyroidism    OSA (obstructive sleep apnea) 02/18/2023   Papillary thyroid carcinoma s/p total thyroidectomy & CLND 08/27/2017 08/24/2017   Postsurgical hypothyroidism 10/01/2017   Thyroid cancer (HCC)    2018   Past Surgical History:  Procedure Laterality Date   ABDOMINAL SURGERY     hemmoraging ulcer   BRONCHIAL BIOPSY  06/25/2020   Procedure: BRONCHIAL BIOPSIES;  Surgeon: Leslye PeerByrum, Robert S, MD;  Location: [facility];  Service: Pulmonary;;   BRONCHIAL BRUSHINGS  06/25/2020   Procedure: BRONCHIAL BRUSHINGS;  Surgeon: Leslye PeerByrum, Robert S, MD;  [facility];  Service: Pulmonary;;   BRONCHIAL NEEDLE ASPIRATION BIOPSY  06/25/2020   Procedure: BRONCHIAL NEEDLE ASPIRATION BIOPSIES;  Surgeon: Leslye PeerByrum, Robert S, MD;   Location: [facility];  Service: Pulmonary;;   BRONCHIAL WASHINGS  06/25/2020   Procedure: BRONCHIAL WASHINGS;  Surgeon: Leslye PeerByrum, Robert S, MD;  Location: [facility];  Service: Pulmonary;;   CATARACT EXTRACTION Right    COLONOSCOPY     EYE SURGERY Right    x 4  between ages 89-13   FIDUCIAL MARKER PLACEMENT  06/25/2020   Procedure: FIDUCIAL MARKER PLACEMENT;  Surgeon: Leslye PeerByrum, Robert S, MD;  [facility];  Service: Pulmonary;;   THYROIDECTOMY N/A 08/27/2017   Procedure: TOTAL THYROIDECTOMY WITH LIMITED LYMPH NODE DISSECTION;  Surgeon: Darnell LevelGerkin, Todd, MD;  Location: [facility];  Service: General;  Laterality: N/A;   UPPER GI ENDOSCOPY     VIDEO BRONCHOSCOPY WITH ENDOBRONCHIAL NAVIGATION N/A 06/25/2020   Procedure: VIDEO BRONCHOSCOPY WITH ENDOBRONCHIAL NAVIGATION;  Surgeon: Leslye PeerByrum, Robert S, MD;  Location: [facility];  Service: Pulmonary;  Laterality: N/A;   Patient Active Problem List   Diagnosis Date Noted   Excessive daytime sleepiness 02/18/2023   History of gastric ulcer 02/18/2023   HTN (hypertension), benign 02/18/2023   Hx of papillary thyroid carcinoma 02/18/2023   Hypercholesteremia 02/18/2023   Low back pain 02/18/2023   Major depression, single episode 02/18/2023   Morbid obesity 02/18/2023   Nodule of left lung 02/18/2023   OSA (obstructive sleep apnea) 02/18/2023   Non-restorative sleep 02/18/2023   Penetrating  ulcer of aorta 02/18/2023   Protrusion of intervertebral disc of thoracic region 02/18/2023   Seborrheic dermatitis 02/18/2023   Vitamin D deficiency 02/18/2023   Late effect of cerebrovascular accident (CVA) 01/05/2023   Dyslipidemia 01/05/2023   History of smoking 01/05/2023   Age-related osteoporosis without current pathological fracture 12/12/2021   Postsurgical hypothyroidism 10/01/2017   Papillary thyroid carcinoma s/p total thyroidectomy & CLND 08/27/2017 08/24/2017   Adenocarcinoma, lung, left 07/05/2017   COPD (chronic obstructive pulmonary disease)  07/05/2017   Effusion of elbow joint, right 06/29/2017   Gastric ulcer with perforation but without obstruction 04/15/2016    ONSET DATE: 02/18/23- referral date  REFERRING DIAG: I63.9 (ICD-10-CM) - Cerebellar stroke (HCC)  THERAPY DIAG:  Other lack of coordination - Plan: Ot plan of care cert/re-cert  Muscle weakness (generalized) - Plan: Ot plan of care cert/re-cert  Attention and concentration deficit - Plan: Ot plan of care cert/re-cert  Frontal lobe and executive function deficit - Plan: Ot plan of care cert/re-cert  Unsteadiness on feet - Plan: Ot plan of care cert/re-cert  Other abnormalities of gait and mobility - Plan: Ot plan of care cert/re-cert  Rationale for Evaluation and Treatment: Rehabilitation  SUBJECTIVE:   SUBJECTIVE STATEMENT: Pt reports rib and pelvis pain Pt accompanied by: self  PERTINENT HISTORY: He underwent MRI scan of the brain on 11/07/2022 which showed a 1 cm right cerebellar cortical and white matter acute infarct as well as remote age bilateral basal ganglia and corona radiata lacunar infarcts.  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 Pt reports rib pain and pelvis pain from fall approx. 2 months ago.Pt states he did not have imaging   PRECAUTIONS: Fall  WEIGHT BEARING RESTRICTIONS: No  PAIN:  Are you having pain? Yes: NPRS scale: 5/10 Pain location: rib and pelvis Pain description: aching Aggravating factors: moving  Relieving factors: sitting  FALLS: Has patient fallen in last 6 months? Yes. Number of falls 3  LIVING ENVIRONMENT: Lives with: lives alone Lives in: House/apartment Stairs: No Has following equipment at home: Single point cane and Grab bars  PLOF:  Independent  PATIENT GOALS: increase safety and I with ADLS  OBJECTIVE:   HAND DOMINANCE: Right  ADLs: Overall ADLs: increased time required Transfers/ambulation related to ADLs:fall risk Eating: mod I Grooming: mod I UB Dressing: mod I LB Dressing: increased time required Toileting: difficulty getting up and down from commode Bathing: stands in shower, does not have seat Tub Shower transfers: performs mod I however would benefit from tub transfer bench Equipment: Grab bars  IADLs: Shopping: pt does his own shopping Light housekeeping: mod I Meal Prep: uses microwave to heat items Community mobility: mod I with hx of falls Medication management: Pt handles his own meds, pt reports sometimes increased difficulty remembering to take Financial management: Pt handles, online whenever possible Handwriting: 100% legible  MOBILITY STATUS: Hx of falls and pt is unsteady without assistive device  Standing functional reach: 10" bilaterally    ACTIVITY TOLERANCE: Activity tolerance: Pt reports that he can stand for activity for approximately 45 mins    UPPER EXTREMITY ROM:  WFLS  (Blank rows = not tested)  UPPER EXTREMITY MMT:     MMT Right eval Left eval  Shoulder flexion 5/5 5/5  Shoulder abduction  Shoulder adduction    Shoulder extension    Shoulder internal rotation    Shoulder external rotation    Middle trapezius    Lower trapezius    Elbow flexion 5/5 4+/5  Elbow extension 4+/5 4+/5  Wrist flexion    Wrist extension    Wrist ulnar deviation    Wrist radial deviation    Wrist pronation    Wrist supination    (Blank rows = not tested)  HAND FUNCTION: Grip strength: Right: 65 lbs; Left: 55 lbs  COORDINATION: 9 hole peg test  SENSATION: Not tested   COGNITION: Overall cognitive status: decreased processing speed, pt reports ST memory deficits, however he was able to recall 3/3 words following short delay, and he was able to spell WORLD  backwards  VISION: Subjective report: blind in R eye  Visual history:  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  VISION ASSESSMENT: Not tested- Pt reports he thinks he is due to see eye doctor and has mild blurriness, will further assess in a functional context     OBSERVATIONS: Pt is unsteady with ambulation   TODAY'S TREATMENT:                                                                                                                              DATE: 03/15/23-n/a   PATIENT EDUCATION: Education details: role of OT, potential goals Person educated: Patient Education method: Explanation Education comprehension: verbalized understanding  HOME EXERCISE PROGRAM: N/A   GOALS: Goals reviewed with patient? Yes  SHORT TERM GOALS: Target date: 04/13/23  I with initial HEP.  Goal status: INITIAL  2.  I with adapted strategies, AE and DME to maximize safety and I with ADLs. ( Consider tub bench and  3 in 1)  Goal status: INITIAL  3.  Pt will verbalize understanding of compensatory strategies for short term memory/ cognitive deficits.  Goal status: INITIAL  4.  Pt will increase LUE grip strength by 5 lbs for increased functional use. Baseline: RUE 65 lbs, LUE- 55 lbs Goal status: INITIAL    LONG TERM GOALS: Target date: 05/17/23  I with updated HEP  Goal status: INITIAL  2.  Pt will demonstrate improved fine motor coordination for ADLs as evidenced by decreasing 9 hole peg test score by 3 secs bilaterally. Baseline:RUE 52.25 secs, LUE 55.06 secs Goal status: INITIAL  3.  Pt will perform transitional movements for ADLs without LOB using UE support. prn  Goal status: INITIAL    ASSESSMENT:  CLINICAL IMPRESSION: Patient is a 67 y.o. male who was seen today for occupational therapy evaluation for I63.9 (ICD-10-CM) - Cerebellar stroke (HCC).  He underwent MRI scan of the brain on 11/07/2022 which showed a 1 cm right cerebellar cortical and white matter acute infarct as  well as remote age bilateral basal ganglia and corona radiata lacunar infarcts.  There is mild changes of small vessel disease.   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 Pt reports rib pain and pelvis pain from fall approx. 2 months ago.Pt states he did not have imaging  PERFORMANCE DEFICITS: in functional skills including ADLs, IADLs, coordination, dexterity, ROM, strength, flexibility, Fine motor control, mobility, balance, endurance, decreased knowledge of precautions, decreased knowledge of use of DME, vision, and UE functional use, cognitive skills including memory, problem solving, safety awareness, and thought, and psychosocial skills including coping strategies, environmental adaptation, habits, interpersonal interactions, and routines and behaviors.   IMPAIRMENTS: are limiting patient from ADLs, IADLs, rest and sleep, education, play, leisure, and social participation.   CO-MORBIDITIES: may have co-morbidities  that affects occupational performance. Patient will benefit from skilled OT to address above impairments and improve overall function.  MODIFICATION OR ASSISTANCE TO COMPLETE EVALUATION: No modification of tasks or assist necessary to complete an evaluation.  OT OCCUPATIONAL PROFILE AND HISTORY: Detailed assessment: Review of records and additional review of physical, cognitive, psychosocial history related to current functional performance.  CLINICAL DECISION MAKING: LOW - limited treatment options, no task modification necessary  REHAB POTENTIAL: Good  EVALUATION COMPLEXITY: Low    PLAN:  OT FREQUENCY: 1x/week plus eval  OT DURATION: 8 weeks  PLANNED INTERVENTIONS: self care/ADL training, therapeutic exercise, therapeutic activity, neuromuscular re-education, manual therapy, passive range  of motion, balance training, functional mobility training, paraffin, moist heat, cryotherapy, contrast bath, patient/family education, cognitive remediation/compensation, visual/perceptual remediation/compensation, energy conservation, coping strategies training, DME and/or AE instructions, and Re-evaluation  RECOMMENDED OTHER SERVICES: PT  CONSULTED AND AGREED WITH PLAN OF CARE: Patient  PLAN FOR NEXT SESSION: initial HEP, coordination, grip strength, DME for safety, tub bench   Jerlene Rockers, OT 03/15/2023, 1:45 PM

## 2023-03-15 ENCOUNTER — Ambulatory Visit: Payer: Medicare HMO | Attending: Neurology

## 2023-03-15 ENCOUNTER — Ambulatory Visit: Payer: Medicare HMO | Admitting: Occupational Therapy

## 2023-03-15 DIAGNOSIS — R2689 Other abnormalities of gait and mobility: Secondary | ICD-10-CM | POA: Diagnosis not present

## 2023-03-15 DIAGNOSIS — R278 Other lack of coordination: Secondary | ICD-10-CM

## 2023-03-15 DIAGNOSIS — M6281 Muscle weakness (generalized): Secondary | ICD-10-CM

## 2023-03-15 DIAGNOSIS — R41840 Attention and concentration deficit: Secondary | ICD-10-CM | POA: Diagnosis not present

## 2023-03-15 DIAGNOSIS — R2681 Unsteadiness on feet: Secondary | ICD-10-CM

## 2023-03-15 DIAGNOSIS — R41844 Frontal lobe and executive function deficit: Secondary | ICD-10-CM | POA: Insufficient documentation

## 2023-03-15 DIAGNOSIS — I639 Cerebral infarction, unspecified: Secondary | ICD-10-CM | POA: Insufficient documentation

## 2023-03-15 NOTE — Addendum Note (Signed)
Addended by: Danne Harbor on: 03/15/2023 05:04 PM   Modules accepted: Orders

## 2023-03-15 NOTE — Telephone Encounter (Signed)
I reordered externally however I didn't know how to cancel the one already scheduled pt [place] have [place] in [place] in [place]. So he will more than likely need to go to [place]. Routing to [facility].

## 2023-03-16 ENCOUNTER — Ambulatory Visit: Payer: Medicare HMO

## 2023-03-16 DIAGNOSIS — M6281 Muscle weakness (generalized): Secondary | ICD-10-CM | POA: Diagnosis not present

## 2023-03-16 DIAGNOSIS — I639 Cerebral infarction, unspecified: Secondary | ICD-10-CM

## 2023-03-16 DIAGNOSIS — R278 Other lack of coordination: Secondary | ICD-10-CM | POA: Diagnosis not present

## 2023-03-16 DIAGNOSIS — R41840 Attention and concentration deficit: Secondary | ICD-10-CM | POA: Diagnosis not present

## 2023-03-16 DIAGNOSIS — R2681 Unsteadiness on feet: Secondary | ICD-10-CM | POA: Diagnosis not present

## 2023-03-16 DIAGNOSIS — R2689 Other abnormalities of gait and mobility: Secondary | ICD-10-CM

## 2023-03-16 DIAGNOSIS — R41844 Frontal lobe and executive function deficit: Secondary | ICD-10-CM | POA: Diagnosis not present

## 2023-03-16 NOTE — Therapy (Signed)
OUTPATIENT PHYSICAL THERAPY NEURO EVALUATION   Patient Name: Timothy Conway MRN: [id number] DOB:12/08/1955, 67 y.o., male Today's Date: 03/16/2023   PCP: Dibas Koirala REFERRING PROVIDER: Mcneil Sober  END OF SESSION:  PT End of Session - 03/16/23 1225     Visit Number 1    Date for PT Re-Evaluation 05/11/23    PT Start Time 1230    PT Stop Time 1315    PT Time Calculation (min) 45 min             Past Medical History:  Diagnosis Date   Adenocarcinoma, lung, left 07/05/2017   Age-related osteoporosis without current pathological fracture 12/12/2021   Anxiety    COPD (chronic obstructive pulmonary disease)    Depression    Dry eye syndrome    Dysrhythmia    hx of PAC's   Effusion of elbow joint, right 06/29/2017   Gastric ulcer with perforation but without obstruction 04/15/2016   GERD (gastroesophageal reflux disease)    Gout    Hyperlipidemia    Hypertension    Hypothyroidism    OSA (obstructive sleep apnea) 02/18/2023   Papillary thyroid carcinoma s/p total thyroidectomy & CLND 08/27/2017 08/24/2017   Postsurgical hypothyroidism 10/01/2017   Thyroid cancer    2018   Past Surgical History:  Procedure Laterality Date   ABDOMINAL SURGERY     hemmoraging ulcer   BRONCHIAL BIOPSY  06/25/2020   Procedure: BRONCHIAL BIOPSIES;  Surgeon: [name], MD;  [facility] ENDOSCOPY;  Service: Pulmonary;;   BRONCHIAL BRUSHINGS  06/25/2020   Procedure: BRONCHIAL BRUSHINGS;  Surgeon: [name], MD;  Location: [facility] ENDOSCOPY;  Service: Pulmonary;;   BRONCHIAL NEEDLE ASPIRATION BIOPSY  06/25/2020   Procedure: BRONCHIAL NEEDLE ASPIRATION BIOPSIES;  Surgeon: [name], MD;  Location: [facility];  Service: Pulmonary;;   BRONCHIAL WASHINGS  06/25/2020   Procedure: BRONCHIAL WASHINGS;  Surgeon: [name], MD;  Location: [facility];  Service: Pulmonary;;   CATARACT EXTRACTION Right    COLONOSCOPY     EYE SURGERY Right    x 4  between ages 69-13   FIDUCIAL  MARKER PLACEMENT  06/25/2020   Procedure: FIDUCIAL MARKER PLACEMENT;  Surgeon: [name], MD;  Location: [facility] ENDOSCOPY;  Service: Pulmonary;;   THYROIDECTOMY N/A 08/27/2017   Procedure: TOTAL THYROIDECTOMY WITH LIMITED LYMPH NODE DISSECTION;  Surgeon: [name], MD;  Location: [facility]S;  Service: General;  Laterality: N/A;   UPPER GI ENDOSCOPY     VIDEO BRONCHOSCOPY WITH ENDOBRONCHIAL NAVIGATION N/A 06/25/2020   Procedure: VIDEO BRONCHOSCOPY WITH ENDOBRONCHIAL NAVIGATION;  Surgeon: [name], MD;  Location: [facility] END[facility];  Service: Pulmonary;  Laterality: N/A;   Patient Active Problem List   Diagnosis Date Noted   Excessive daytime sleepiness 02/18/2023   History of gastric ulcer 02/18/2023   HTN (hypertension), benign 02/18/2023   Hx of papillary thyroid carcinoma 02/18/2023   Hypercholesteremia 02/18/2023   Low back pain 02/18/2023   Major depression, single episode 02/18/2023   Morbid obesity 02/18/2023   Nodule of left lung 02/18/2023   OSA (obstructive sleep apnea) 02/18/2023   Non-restorative sleep 02/18/2023   Penetrating ulcer of aorta 02/18/2023   Protrusion of intervertebral disc of thoracic region 02/18/2023   Seborrheic dermatitis 02/18/2023   Vitamin D deficiency 02/18/2023   Late effect of cerebrovascular accident (CVA) 01/05/2023   Dyslipidemia 01/05/2023   History of smoking 01/05/2023   Age-related osteoporosis without current pathological fracture 12/12/2021   Postsurgical hypothyroidism 10/01/2017  Papillary thyroid carcinoma s/p total thyroidectomy & CLND 08/27/2017 08/24/2017   Adenocarcinoma, lung, left 07/05/2017   COPD (chronic obstructive pulmonary disease) 07/05/2017   Effusion of elbow joint, right 06/29/2017   Gastric ulcer with perforation but without obstruction 04/15/2016    ONSET DATE: 02/18/23  REFERRING DIAG: I63.9 (ICD-10-CM) - Cerebellar stroke (HCC)   THERAPY DIAG:  No diagnosis found.  Rationale for Evaluation and Treatment:  Rehabilitation  SUBJECTIVE:                                                                                                                                                                                             SUBJECTIVE STATEMENT: I am doing okay, I do have pain because of my broken rib on my right side. I did not get x-ray of it but I know it is broken because I had something like this before. This was about 2 months ago.   Pt accompanied by: self  PERTINENT HISTORY: He underwent MRI scan of the brain on 11/07/2022 which showed a 1 cm right cerebellar cortical and white matter acute infarct as well as remote age bilateral basal ganglia and corona radiata lacunar infarcts.  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 Pt reports rib pain and pelvis pain from fall approx. 2 months ago. Pt states he did not have imaging  He is completely blind in his right eye.  PAIN:  Are you having pain? Yes: NPRS scale: 5/10 Pain location: R lateral flank and top of iliac crest  Pain description: constant, dull, ache Aggravating factors: if I move the wrong way, standing and sitting  Relieving factors: nothing   PRECAUTIONS: None  WEIGHT BEARING RESTRICTIONS: No  FALLS: Has patient fallen in last 6 months? Yes. Number of falls 3  LIVING ENVIRONMENT: Lives with: lives alone Lives in: House/apartment Stairs: No Has following equipment at home: Single point cane  PLOF: Independent  PATIENT GOALS: walk straight and not fall  OBJECTIVE:   DIAGNOSTIC FINDINGS:  IMPRESSION: 1. Positive for a small subacute lacunar infarct in the Right cerebellum. No associated hemorrhage or mass effect. And underlying chronic small vessel ischemic disease in  the cerebral white matter and basal ganglia.   2. No metastatic disease identified.   3. Bulky degenerative anterior cervical spine spurring at C3-C4.   These results will be called to the ordering clinician or representative by the Radiologist  Geophysicist/field seismologist, and communication documented in the PACS or [employer].  COGNITION: Overall cognitive status: Within functional limits for tasks assessed   SENSATION: WFL  POSTURE: rounded shoulders and forward head  LOWER EXTREMITY ROM:  grossly WFL   LOWER EXTREMITY MMT:  grossly 5/5 all LE   GAIT: Gait pattern: step through pattern, decreased arm swing- Right, decreased arm swing- Left, shuffling, ataxic, narrow BOS, poor foot clearance- Right, and poor foot clearance- Left Distance walked: in clinic distances Assistive device utilized: None Level of assistance: Complete Independence and Modified independence Comments: has some staggering gait an deviates towards   FUNCTIONAL TESTS:  5 times sit to stand: 18.17s Timed up and go (TUG): 13.12s Berg Balance Scale: 49/56 Functional gait assessment: 21/30  TODAY'S TREATMENT:                                                                                                                              DATE: 03/16/23- EVAL    PATIENT EDUCATION: Education details: POC and HEP, importance of using a cane for safety reasons and to prevent more falls Person educated: Patient Education method: Explanation Education comprehension: verbalized understanding  HOME EXERCISE PROGRAM: Access Code: [email or web address] URL: [email or web address] Date: 03/16/2023 Prepared by: Cassie Freer  Exercises - Mini Squat with Counter Support  - 1 x daily - 7 x weekly - 2 sets - 10 reps - Standing March with Counter Support  - 1 x daily - 7 x weekly - 2 sets - 10 reps - Sit to Stand  - 1 x daily - 7 x weekly - 2 sets - 10 reps - Heel Raises with Counter Support  - 1 x daily - 7 x weekly - 2 sets - 10  reps - Standing Tandem Balance with Counter Support  - 1 x daily - 7 x weekly - 15-30 hold  GOALS: Goals reviewed with patient? Yes  SHORT TERM GOALS: Target date: 04/13/23  Patient will be independent with initial HEP. Goal status: INITIAL  2.  Patient will be educated on strategies to decrease risk of falls.  Goal status: INITIAL   LONG TERM GOALS: Target date: 05/11/23  Patient will be independent with advanced/ongoing HEP to improve outcomes and carryover.  Goal status: INITIAL  2.  Patient will be able to ambulate without gait deviations and staggering.  Baseline: loses balance and deviates to the L  Goal status: INITIAL   3.  Patient will demonstrate improved functional LE strength and decreased fall risk by scoring < 12 sec on 5xSTS. Baseline: 18.17 Goal status: INITIAL.  4.  Patient will demonstrate at least 26/30 on FGA to improve gait stability and reduce risk for falls. Baseline: 21/30 Goal status: INITIAL  ASSESSMENT:  CLINICAL IMPRESSION: Patient is a 67 y.o. male who was seen today for physical therapy evaluation and treatment for CVA. He is doing well but reports he has had at least 3 falls. He has some pain in his  R rib due to the fall but has not gotten confirmation with imaging as he states there's nothing you can do about it anyways. Patient is a bit off balance with gait and single leg tasks. He is also blind in one eye so that also plays a role in his stability and balance. He was educated to try using his cane for safety reasons and to prevent any further falls. Patient will benefit from skilled PT to address his gait and balance impairments to decrease his fall risk.   OBJECTIVE IMPAIRMENTS: Abnormal gait, decreased balance, decreased coordination, and pain.   REHAB POTENTIAL: Good  CLINICAL DECISION MAKING: Stable/uncomplicated  EVALUATION COMPLEXITY: Low  PLAN:  PT FREQUENCY: 1x/week  PT DURATION: 8 weeks  PLANNED INTERVENTIONS: Therapeutic  exercises, Therapeutic activity, Neuromuscular re-education, Balance training, Gait training, Patient/Family education, Self Care, Joint mobilization, Stair training, Cryotherapy, Moist heat, and Manual therapy  PLAN FOR NEXT SESSION: gym activities to work on balance and [employer], PT 03/16/2023, 1:36 PM

## 2023-03-18 ENCOUNTER — Ambulatory Visit: Payer: Medicare HMO | Admitting: Cardiovascular Disease

## 2023-03-18 NOTE — Progress Notes (Deleted)
Electrophysiology Office Note:    Date:  03/18/2023   ID:  Timothy Conway, DOB [birth year], MRN [id number]  PCP:  [name], MD   [facility] Providers Cardiologist:  None { Click to update primary MD,subspecialty MD or APP then REFRESH:1}    Referring MD: Timothy Riley, MD   History of Present Illness:    Timothy Conway is a 67 y.o. male with a hx listed below, significant for ***, referred for arrhythmia management.    Past Medical History:  Diagnosis Date   Adenocarcinoma, lung, left 07/05/2017   Age-related osteoporosis without current pathological fracture 12/12/2021   Anxiety    COPD (chronic obstructive pulmonary disease)    Depression    Dry eye syndrome    Dysrhythmia    hx of PAC's   Effusion of elbow joint, right 06/29/2017   Gastric ulcer with perforation but without obstruction 04/15/2016   GERD (gastroesophageal reflux disease)    Gout    Hyperlipidemia    Hypertension    Hypothyroidism    OSA (obstructive sleep apnea) 02/18/2023   Papillary thyroid carcinoma s/p total thyroidectomy & CLND 08/27/2017 08/24/2017   Postsurgical hypothyroidism 10/01/2017   Thyroid cancer    2018    Past Surgical History:  Procedure Laterality Date   ABDOMINAL SURGERY     hemmoraging ulcer   BRONCHIAL BIOPSY  06/25/2020   Procedure: BRONCHIAL BIOPSIES;  Surgeon: [name], MD;  Location[facility] ENDOSCOPY;  Service: Pulmonary;;   BRONCHIAL BRUSHINGS  06/25/2020   Procedure: BRONCHIAL BRUSHINGS;  Surgeon: [name], MD;  Location: [facility] ENDOSCOPY;  Service: Pulmonary;;   BRONCHIAL NEEDLE ASPIRATION BIOPSY  06/25/2020   Procedure: BRONCHIAL NEEDLE ASPIRATION BIOPSIES;  Surgeon: [name], MD;  Location: [facility] ENDOSCOPY;  Service: Pulmonary;;   BRONCHIAL WASHINGS  06/25/2020   Procedure: BRONCHIAL WASHINGS;  Surgeon: [name], MD;  Location: [facility] ENDOSCOPY;  Service: Pulmonary;;   CATARACT EXTRACTION Right    COLONOSCOPY     EYE SURGERY Right    x  4  between ages 50-13   FIDUCIAL MARKER PLACEMENT  06/25/2020   Procedure: FIDUCIAL MARKER PLACEMENT;  Surgeon: [name], MD;  Location: [facility] ENDOSCOPY;  Service: Pulmonary;;   THYROIDECTOMY N/A 08/27/2017   Procedure: TOTAL THYROIDECTOMY WITH LIMITED LYMPH NODE DISSECTION;  Surgeon: [name], MD;  Location: [facility] ORS;  Service: General;  Laterality: N/A;   UPPER GI ENDOSCOPY     VIDEO BRONCHOSCOPY WITH ENDOBRONCHIAL NAVIGATION N/A 06/25/2020   Procedure: VIDEO BRONCHOSCOPY WITH ENDOBRONCHIAL NAVIGATION;  Surgeon: [name], MD;  Location: [facility] ENDOSCOPY;  Service: Pulmonary;  Laterality: N/A;    Current Medications: No outpatient medications have been marked as taking for the 03/18/23 encounter (Appointment) with Rori Goar, Roberts Gaudy, MD.     Allergies:   Citalopram and Trazodone   Social and Family History: Reviewed in Epic  ROS:   Please see the history of present illness.    All other systems reviewed and are negative.  EKGs/Labs/Other Studies Reviewed Today:    Echocardiogram:  ***   Monitors:  ***  Stress testing:  ***    Advanced imaging:  ***   EKG:  Last EKG results: ***   Recent Labs: 10/16/2022: ALT 11; BUN 16; Creatinine 1.27; Hemoglobin 16.2; Platelet Count 217; Potassium 4.6; Sodium 142 02/18/2023: TSH 0.602     Physical Exam:    VS:  There were no vitals taken for this visit.    Wt Readings  from Last 3 Encounters:  02/18/23 264 lb (119.7 kg)  01/05/23 264 lb (119.7 kg)  10/16/22 266 lb (120.7 kg)     GEN: *** Well nourished, well developed in no acute distress CARDIAC: ***RRR, no murmurs, rubs, gallops RESPIRATORY:  Normal work of breathing MUSCULOSKELETAL: *** edema    ASSESSMENT & PLAN:    Stroke -  Right cerebellar infarct ILR has been requested by Dr. Pearlean Brownie  SVT  Detected on 14 day Zio patch  *** ***  *** ***  *** ***          Medication Adjustments/Labs and Tests Ordered: Current medicines are  reviewed at length with the patient today.  Concerns regarding medicines are outlined above.  No orders of the defined types were placed in this encounter.  No orders of the defined types were placed in this encounter.    Signed, Maurice Small, MD  03/18/2023 9:58 AM    [facility]

## 2023-03-23 NOTE — Telephone Encounter (Signed)
He has to cancel at GI in order to schedule at [facility].

## 2023-03-24 ENCOUNTER — Ambulatory Visit: Payer: Medicare HMO | Admitting: Occupational Therapy

## 2023-03-24 ENCOUNTER — Ambulatory Visit: Payer: Medicare HMO

## 2023-03-25 DIAGNOSIS — I10 Essential (primary) hypertension: Secondary | ICD-10-CM | POA: Diagnosis not present

## 2023-03-25 DIAGNOSIS — E7800 Pure hypercholesterolemia, unspecified: Secondary | ICD-10-CM | POA: Diagnosis not present

## 2023-03-25 DIAGNOSIS — Z8673 Personal history of transient ischemic attack (TIA), and cerebral infarction without residual deficits: Secondary | ICD-10-CM | POA: Diagnosis not present

## 2023-03-31 ENCOUNTER — Ambulatory Visit: Payer: Medicare HMO | Admitting: Occupational Therapy

## 2023-03-31 ENCOUNTER — Ambulatory Visit: Payer: Medicare HMO

## 2023-03-31 ENCOUNTER — Other Ambulatory Visit: Payer: Self-pay | Admitting: Internal Medicine

## 2023-03-31 DIAGNOSIS — C73 Malignant neoplasm of thyroid gland: Secondary | ICD-10-CM

## 2023-04-01 ENCOUNTER — Ambulatory Visit: Payer: Medicare HMO | Admitting: Neurology

## 2023-04-03 DIAGNOSIS — Y92009 Unspecified place in unspecified non-institutional (private) residence as the place of occurrence of the external cause: Secondary | ICD-10-CM | POA: Diagnosis not present

## 2023-04-03 DIAGNOSIS — M549 Dorsalgia, unspecified: Secondary | ICD-10-CM | POA: Diagnosis not present

## 2023-04-03 DIAGNOSIS — M25551 Pain in right hip: Secondary | ICD-10-CM | POA: Diagnosis not present

## 2023-04-03 DIAGNOSIS — Z8781 Personal history of (healed) traumatic fracture: Secondary | ICD-10-CM | POA: Diagnosis not present

## 2023-04-03 DIAGNOSIS — Z6834 Body mass index (BMI) 34.0-34.9, adult: Secondary | ICD-10-CM | POA: Diagnosis not present

## 2023-04-03 DIAGNOSIS — M79661 Pain in right lower leg: Secondary | ICD-10-CM | POA: Diagnosis not present

## 2023-04-03 DIAGNOSIS — W19XXXA Unspecified fall, initial encounter: Secondary | ICD-10-CM | POA: Diagnosis not present

## 2023-04-03 DIAGNOSIS — M25552 Pain in left hip: Secondary | ICD-10-CM | POA: Diagnosis not present

## 2023-04-05 ENCOUNTER — Ambulatory Visit: Payer: Medicare HMO | Admitting: Cardiology

## 2023-04-06 ENCOUNTER — Ambulatory Visit: Payer: Medicare HMO

## 2023-04-06 ENCOUNTER — Ambulatory Visit
Admission: RE | Admit: 2023-04-06 | Discharge: 2023-04-06 | Disposition: A | Discharge status: Home / Self Care | Payer: Medicare HMO | Source: Ambulatory Visit | Attending: Neurology | Admitting: Neurology

## 2023-04-06 DIAGNOSIS — I6509 Occlusion and stenosis of unspecified vertebral artery: Secondary | ICD-10-CM | POA: Diagnosis not present

## 2023-04-06 DIAGNOSIS — I639 Cerebral infarction, unspecified: Secondary | ICD-10-CM | POA: Diagnosis not present

## 2023-04-06 MED ORDER — IOPAMIDOL (ISOVUE-370) INJECTION 76%
75.0000 mL | Freq: Once | INTRAVENOUS | Status: AC | PRN
  Administered 2023-04-06: 75 mL via INTRAVENOUS

## 2023-04-07 ENCOUNTER — Ambulatory Visit: Payer: Medicare HMO | Admitting: Occupational Therapy

## 2023-04-07 ENCOUNTER — Ambulatory Visit: Payer: Medicare HMO

## 2023-04-07 DIAGNOSIS — R0781 Pleurodynia: Secondary | ICD-10-CM | POA: Diagnosis not present

## 2023-04-07 DIAGNOSIS — C3492 Malignant neoplasm of unspecified part of left bronchus or lung: Secondary | ICD-10-CM | POA: Diagnosis not present

## 2023-04-07 DIAGNOSIS — M545 Low back pain, unspecified: Secondary | ICD-10-CM | POA: Diagnosis not present

## 2023-04-07 DIAGNOSIS — I719 Aortic aneurysm of unspecified site, without rupture: Secondary | ICD-10-CM | POA: Diagnosis not present

## 2023-04-07 DIAGNOSIS — F321 Major depressive disorder, single episode, moderate: Secondary | ICD-10-CM | POA: Diagnosis not present

## 2023-04-07 DIAGNOSIS — J449 Chronic obstructive pulmonary disease, unspecified: Secondary | ICD-10-CM | POA: Diagnosis not present

## 2023-04-08 ENCOUNTER — Other Ambulatory Visit: Payer: Self-pay

## 2023-04-08 DIAGNOSIS — G4739 Other sleep apnea: Secondary | ICD-10-CM | POA: Insufficient documentation

## 2023-04-09 ENCOUNTER — Encounter: Payer: Self-pay | Admitting: Hematology & Oncology

## 2023-04-09 ENCOUNTER — Inpatient Hospital Stay: Payer: Medicare HMO | Admitting: Hematology & Oncology

## 2023-04-09 ENCOUNTER — Other Ambulatory Visit: Payer: Self-pay

## 2023-04-09 ENCOUNTER — Inpatient Hospital Stay: Payer: Medicare HMO | Attending: Hematology & Oncology

## 2023-04-09 ENCOUNTER — Ambulatory Visit (HOSPITAL_BASED_OUTPATIENT_CLINIC_OR_DEPARTMENT_OTHER)
Admission: RE | Admit: 2023-04-09 | Discharge: 2023-04-09 | Disposition: A | Discharge status: Home / Self Care | Payer: Medicare HMO | Source: Ambulatory Visit | Attending: Hematology & Oncology | Admitting: Hematology & Oncology

## 2023-04-09 VITALS — BP 114/38 | HR 58 | Temp 98.8°F | Resp 19 | Ht 72.0 in | Wt 261.0 lb

## 2023-04-09 DIAGNOSIS — J984 Other disorders of lung: Secondary | ICD-10-CM | POA: Diagnosis not present

## 2023-04-09 DIAGNOSIS — I700 Atherosclerosis of aorta: Secondary | ICD-10-CM | POA: Insufficient documentation

## 2023-04-09 DIAGNOSIS — Z923 Personal history of irradiation: Secondary | ICD-10-CM | POA: Diagnosis not present

## 2023-04-09 DIAGNOSIS — C3412 Malignant neoplasm of upper lobe, left bronchus or lung: Secondary | ICD-10-CM | POA: Diagnosis not present

## 2023-04-09 DIAGNOSIS — C3490 Malignant neoplasm of unspecified part of unspecified bronchus or lung: Secondary | ICD-10-CM | POA: Diagnosis not present

## 2023-04-09 DIAGNOSIS — C3492 Malignant neoplasm of unspecified part of left bronchus or lung: Secondary | ICD-10-CM

## 2023-04-09 LAB — CBC WITH DIFFERENTIAL (CANCER CENTER ONLY)
Abs Immature Granulocytes: 0.02 10*3/uL (ref 0.00–0.07)
Basophils Absolute: 0.1 10*3/uL (ref 0.0–0.1)
Basophils Relative: 1 %
Eosinophils Absolute: 0.1 10*3/uL (ref 0.0–0.5)
Eosinophils Relative: 2 %
HCT: 51.6 % (ref 39.0–52.0)
Hemoglobin: 16.5 g/dL (ref 13.0–17.0)
Immature Granulocytes: 0 %
Lymphocytes Relative: 25 %
Lymphs Abs: 1.6 10*3/uL (ref 0.7–4.0)
MCH: 30.7 pg (ref 26.0–34.0)
MCHC: 32 g/dL (ref 30.0–36.0)
MCV: 96.1 fL (ref 80.0–100.0)
Monocytes Absolute: 0.3 10*3/uL (ref 0.1–1.0)
Monocytes Relative: 5 %
Neutro Abs: 4.1 10*3/uL (ref 1.7–7.7)
Neutrophils Relative %: 67 %
Platelet Count: 257 10*3/uL (ref 150–400)
RBC: 5.37 MIL/uL (ref 4.22–5.81)
RDW: 12.2 % (ref 11.5–15.5)
WBC Count: 6.3 10*3/uL (ref 4.0–10.5)
nRBC: 0 % (ref 0.0–0.2)

## 2023-04-09 LAB — CMP (CANCER CENTER ONLY)
ALT: 9 U/L (ref 0–44)
AST: 14 U/L — ABNORMAL LOW (ref 15–41)
Albumin: 4.5 g/dL (ref 3.5–5.0)
Alkaline Phosphatase: 28 U/L — ABNORMAL LOW (ref 38–126)
Anion gap: 7 (ref 5–15)
BUN: 12 mg/dL (ref 8–23)
CO2: 33 mmol/L — ABNORMAL HIGH (ref 22–32)
Calcium: 8.7 mg/dL — ABNORMAL LOW (ref 8.9–10.3)
Chloride: 101 mmol/L (ref 98–111)
Creatinine: 1.09 mg/dL (ref 0.61–1.24)
GFR, Estimated: >60 mL/min (ref >60)
Glucose, Bld: 114 mg/dL — ABNORMAL HIGH (ref 70–99)
Potassium: 4.4 mmol/L (ref 3.5–5.1)
Sodium: 141 mmol/L (ref 135–145)
Total Bilirubin: 0.8 mg/dL (ref 0.3–1.2)
Total Protein: 7.2 g/dL (ref 6.5–8.1)

## 2023-04-09 NOTE — Progress Notes (Signed)
[name]  ARDA CESAR [id number] 01/27/1956 67 y.o. 04/09/2023   Principle Diagnosis:  Stage Ia (T1N0M0) adenocarcinoma of the left upper lung  Current Therapy:   Status post radiosurgery-5400 rad that was completed in 08/20/2020     Interim History:  Mr. Baldez is back for follow-up.  Unfortunately his problem now is that he did develop a subacute stroke.  We actually did do an MRI on him.  When we last saw him, he was complaining of dizziness.  The MRI was done on 11/07/2022.  This did show a subacute infarct in the right cerebellum.  He is taking baby aspirin.  He does see neurology.  He has had problems with falling.  I think he fell recently.  He did not break anything.  He does have some discomfort over on his right side.  We did do a CT of his chest today.  Thankfully, the CT of the chest did not show any evidence of recurrent lung cancer.  He has a post radiosurgery changes.  He has had no issues with nausea or vomiting.  There is no bowel or bladder incontinence.  He has had no bleeding.  He has had no double vision.  Overall, I would have to say that his performance status is probably ECOG 2.    Medications:  Current Outpatient Medications:    alendronate (FOSAMAX) 70 MG tablet, TAKE 1 TABLET EVERY 7 DAYS WITH A FULL GLASS OF WATER ON AN EMPTY STOMACH., Disp: 12 tablet, Rfl: 3   alprazolam (XANAX) 2 MG tablet, Take 2 mg by mouth 3 (three) times daily as needed (anxiety.). Pt taking 1/2 to 1 pill three times a day as needed., Disp: , Rfl:    atenolol (TENORMIN) 100 MG tablet, Take 100 mg by mouth 2 (two) times daily., Disp: , Rfl:    CALCIUM PO, Take 1 tablet by mouth daily., Disp: , Rfl:    Cholecalciferol (VITAMIN D) 2000 units tablet, Take 2,000 Units by mouth daily., Disp: , Rfl:    fenofibrate 160 MG tablet, Take 160 mg by mouth daily with supper. , Disp: , Rfl:    hydrocortisone 2.5 % cream, Apply 1 application  topically 2 (two) times  daily as needed (rash)., Disp: , Rfl:    levothyroxine (SYNTHROID) 125 MCG tablet, Take 1 tablet (125 mcg total) by mouth daily., Disp: 30 tablet, Rfl: 0   Multiple Vitamin (MULTIVITAMIN WITH MINERALS) TABS tablet, Take 1 tablet by mouth daily., Disp: , Rfl:    omeprazole (PRILOSEC) 20 MG capsule, Take 20 mg by mouth daily before breakfast. , Disp: , Rfl:    rosuvastatin (CRESTOR) 10 MG tablet, Take 10 mg by mouth daily. , Disp: , Rfl:   Allergies: No Active Allergies  Past Medical History, Surgical history, Social history, and Family History were reviewed and updated.  Review of Systems: Review of Systems  Constitutional: Negative.   HENT:  Negative.    Eyes: Negative.   Respiratory: Negative.    Cardiovascular: Negative.   Gastrointestinal: Negative.  Negative for abdominal distention.  Endocrine: Negative.   Genitourinary: Negative.    Musculoskeletal:  Positive for gait problem.  Skin: Negative.   Neurological:  Positive for gait problem.  Hematological: Negative.   Psychiatric/Behavioral: Negative.      Physical Exam:  height is 6' (1.829 m) and weight is 261 lb (118.4 kg). His oral temperature is 98.8 F (37.1 C). His blood pressure is 114/38 (abnormal) and his pulse  is 58 (abnormal). His respiration is 19 and oxygen saturation is 94%.   Wt Readings from Last 3 Encounters:  04/09/23 261 lb (118.4 kg)  02/18/23 264 lb (119.7 kg)  01/05/23 264 lb (119.7 kg)    Physical Exam Vitals reviewed.  HENT:     Head: Normocephalic and atraumatic.  Eyes:     Pupils: Pupils are equal, round, and reactive to light.  Cardiovascular:     Rate and Rhythm: Normal rate and regular rhythm.     Heart sounds: Normal heart sounds.  Pulmonary:     Effort: Pulmonary effort is normal.     Breath sounds: Normal breath sounds.  Abdominal:     General: Bowel sounds are normal.     Palpations: Abdomen is soft.  Musculoskeletal:        General: No tenderness or deformity. Normal range of  motion.     Cervical back: Normal range of motion.  Lymphadenopathy:     Cervical: No cervical adenopathy.  Skin:    General: Skin is warm and dry.     Findings: No erythema or rash.  Neurological:     Mental Status: He is alert and oriented to person, place, and time.  Psychiatric:        Behavior: Behavior normal.        Thought Content: Thought content normal.        Judgment: Judgment normal.    Lab Results  Component Value Date   WBC 6.3 04/09/2023   HGB 16.5 04/09/2023   HCT 51.6 04/09/2023   MCV 96.1 04/09/2023   PLT 257 04/09/2023     Chemistry      Component Value Date/Time   NA 141 04/09/2023 0950   K 4.4 04/09/2023 0950   CL 101 04/09/2023 0950   CO2 33 (H) 04/09/2023 0950   BUN 12 04/09/2023 0950   CREATININE 1.09 04/09/2023 0950      Component Value Date/Time   CALCIUM 8.7 (L) 04/09/2023 0950   ALKPHOS 28 (L) 04/09/2023 0950   AST 14 (L) 04/09/2023 0950   ALT 9 04/09/2023 0950   BILITOT 0.8 04/09/2023 0950      Impression and Plan: Mr. Berkenpas is a very nice 67 year old white male.  He had a non small cell lung cancer of the left upper lung.  This was an adenocarcinoma.  It was a low-grade adenocarcinoma based on the low level of activity on the PET scan.  He underwent radiosurgery for this.  He had this back in 2021.  Hopefully, this whole neurological issue will sort itself out.  Again he does see neurology.  I know that they will do a good job with him.  I do not know if he needs any kind of physical therapy.  I think we still have to follow along with his CT of the chest.  We will do another 1 in 6 months.  I think everything looks good and 6 months, then maybe we can just go with chest x-rays.     Josph Macho, MD  5/10/202411:06 AM

## 2023-04-12 ENCOUNTER — Encounter: Payer: Self-pay | Admitting: Cardiology

## 2023-04-12 ENCOUNTER — Ambulatory Visit: Payer: Medicare HMO | Attending: Cardiology | Admitting: Cardiology

## 2023-04-12 VITALS — BP 110/60 | HR 56 | Ht 72.0 in | Wt 258.0 lb

## 2023-04-12 DIAGNOSIS — I10 Essential (primary) hypertension: Secondary | ICD-10-CM

## 2023-04-12 DIAGNOSIS — E785 Hyperlipidemia, unspecified: Secondary | ICD-10-CM

## 2023-04-12 DIAGNOSIS — I6930 Unspecified sequelae of cerebral infarction: Secondary | ICD-10-CM

## 2023-04-12 DIAGNOSIS — G4733 Obstructive sleep apnea (adult) (pediatric): Secondary | ICD-10-CM

## 2023-04-12 NOTE — Progress Notes (Signed)
Cardiology Office Note:    Date:  04/12/2023   ID:  Timothy Conway, DOB Mar 24, 1956, MRN [id number]  PCP:  Timothy Bussing, MD  Cardiologist:  Timothy Balsam, MD    Referring MD: Timothy Bussing, MD   Chief Complaint  Patient presents with   Results  Doing fine  History of Present Illness:    Timothy Conway is a 67 y.o. male with past medical history significant for CVA, he did suffer from a lateral cerebellar stroke, essential hypertension, dyslipidemia, smoking.  He does not smoke anymore came to ask for follow-up also for rule out potential cardiac source of his emboli, however, look like he suffered from lacunar stroke which is rather in situ than embolic.  He did have echocardiogram which did not show any significant pathology, he also wore a Zio patch did not show any significant arrhythmia.  He comes today for follow-up.  Overall he said he is doing fine overall he does have a balance issue because of stroke and he fell down few times already.  He injured his back which bothers him a lot.  He does not passed out to simply lose balance.  Past Medical History:  Diagnosis Date   Adenocarcinoma, lung, left (HCC) 07/05/2017   Age-related osteoporosis without current pathological fracture 12/12/2021   Anxiety    COPD (chronic obstructive pulmonary disease) (HCC)    Depression    Dry eye syndrome    Dysrhythmia    hx of PAC's   Effusion of elbow joint, right 06/29/2017   Gastric ulcer with perforation but without obstruction (HCC) 04/15/2016   GERD (gastroesophageal reflux disease)    Gout    Hyperlipidemia    Hypertension    Hypothyroidism    OSA (obstructive sleep apnea) 02/18/2023   Papillary thyroid carcinoma s/p total thyroidectomy & CLND 08/27/2017 08/24/2017   Postsurgical hypothyroidism 10/01/2017   Thyroid cancer (HCC)    2018    Past Surgical History:  Procedure Laterality Date   ABDOMINAL SURGERY     hemmoraging ulcer   BRONCHIAL BIOPSY  06/25/2020   Procedure:  BRONCHIAL BIOPSIES;  Surgeon: [name], MD;  Location: [facility] ENDOSCOPY;  Service: Pulmonary;;   BRONCHIAL BRUSHINGS  06/25/2020   Procedure: BRONCHIAL BRUSHINGS;  Surgeon: [name], MD;  Location: [facility] ENDOSCOPY;  Service: Pulmonary;;   BRONCHIAL NEEDLE ASPIRATION BIOPSY  06/25/2020   Procedure: BRONCHIAL NEEDLE ASPIRATION BIOPSIES;  Surgeon: [name], MD;  Location: [facility] ENDOSCOPY;  Service: Pulmonary;;   BRONCHIAL WASHINGS  06/25/2020   Procedure: BRONCHIAL WASHINGS;  Surgeon: [name], MD;  Location: [facility] ENDOSCOPY;  Service: Pulmonary;;   CATARACT EXTRACTION Right    COLONOSCOPY     EYE SURGERY Right    x 4  between ages 33-13   FIDUCIAL MARKER PLACEMENT  06/25/2020   Procedure: FIDUCIAL MARKER PLACEMENT;  Surgeon: [name], MD;  Location: [facility] ENDOSCOPY;  Service: Pulmonary;;   THYROIDECTOMY N/A 08/27/2017   Procedure: TOTAL THYROIDECTOMY WITH LIMITED LYMPH NODE DISSECTION;  Surgeon: [name], MD;  Location: [facility] ORS;  Service: General;  Laterality: N/A;   UPPER GI ENDOSCOPY     VIDEO BRONCHOSCOPY WITH ENDOBRONCHIAL NAVIGATION N/A 06/25/2020   Procedure: VIDEO BRONCHOSCOPY WITH ENDOBRONCHIAL NAVIGATION;  Surgeon: [name], MD;  Location: [facility] ENDOSCOPY;  Service: Pulmonary;  Laterality: N/A;    Current Medications: Current Meds  Medication Sig   alendronate (FOSAMAX) 70 MG tablet TAKE 1 TABLET EVERY 7 DAYS WITH A FULL GLASS OF  WATER ON AN EMPTY STOMACH. (Patient taking differently: Take 70 mg by mouth once a week.)   alprazolam (XANAX) 2 MG tablet Take 2 mg by mouth 3 (three) times daily as needed for sleep or anxiety (anxiety.). Pt taking 1/2 to 1 pill three times a day as needed.   atenolol (TENORMIN) 100 MG tablet Take 100 mg by mouth 2 (two) times daily.   CALCIUM PO Take 1 tablet by mouth daily.   Cholecalciferol (VITAMIN D) 2000 units tablet Take 2,000 Units by mouth daily.   fenofibrate 160 MG tablet Take 160 mg by mouth daily with supper.     hydrocortisone 2.5 % cream Apply 1 application  topically 2 (two) times daily as needed (rash).   levothyroxine (SYNTHROID) 125 MCG tablet Take 1 tablet (125 mcg total) by mouth daily.   Multiple Vitamin (MULTIVITAMIN WITH MINERALS) TABS tablet Take 1 tablet by mouth daily.   omeprazole (PRILOSEC) 20 MG capsule Take 20 mg by mouth daily before breakfast.    rosuvastatin (CRESTOR) 10 MG tablet Take 10 mg by mouth daily.      Allergies:   Patient has no active allergies.   Social History   Socioeconomic History   Marital status: Single    Spouse name: Not on file   Number of children: Not on file   Years of education: Not on file   Highest education level: Not on file  Occupational History   Not on file  Tobacco Use   Smoking status: Former    Packs/day: 1.00    Years: 40.00    Additional pack years: 0.00    Total pack years: 40.00    Types: Cigarettes    Quit date: 05/2019    Years since quitting: 3.9   Smokeless tobacco: Never  Vaping Use   Vaping Use: Never used  Substance and Sexual Activity   Alcohol use: Not Currently    Alcohol/week: 0.0 standard drinks of alcohol    Comment: Used to drink a lot -moderate - quit 2020   Drug use: Not Currently    Types: Marijuana    Comment: last use years ago   Sexual activity: Not Currently  Other Topics Concern   Not on file  Social History Narrative   Not on file   Social Determinants of Health   Financial Resource Strain: Not on file  Food Insecurity: Not on file  Transportation Needs: Not on file  Physical Activity: Not on file  Stress: Not on file  Social Connections: Not on file     Family History: The patient's family history is not on file. He was adopted. ROS:   Please see the history of present illness.    All 14 point review of systems negative except as described per history of present illness  EKGs/Labs/Other Studies Reviewed:      Recent Labs: 02/18/2023: TSH 0.602 04/09/2023: ALT 9; BUN 12;  Creatinine 1.09; Hemoglobin 16.5; Platelet Count 257; Potassium 4.4; Sodium 141  Recent Lipid Panel    Component Value Date/Time   CHOL 149 02/18/2023 1123   TRIG 167 (H) 02/18/2023 1123   HDL 45 02/18/2023 1123   CHOLHDL 3.3 02/18/2023 1123   LDLCALC 75 02/18/2023 1123    Physical Exam:    VS:  BP 110/60 (BP Location: Left Arm, Patient Position: Sitting)   Pulse (!) 56   Ht 6' (1.829 m)   Wt 258 lb (117 kg)   SpO2 93%   BMI 34.99 kg/m  Wt Readings from Last 3 Encounters:  04/12/23 258 lb (117 kg)  04/09/23 261 lb (118.4 kg)  02/18/23 264 lb (119.7 kg)     GEN:  Well nourished, well developed in no acute distress HEENT: Normal NECK: No JVD; No carotid bruits LYMPHATICS: No lymphadenopathy CARDIAC: RRR, no murmurs, no rubs, no gallops RESPIRATORY:  Clear to auscultation without rales, wheezing or rhonchi  ABDOMEN: Soft, non-tender, non-distended MUSCULOSKELETAL:  No edema; No deformity  SKIN: Warm and dry LOWER EXTREMITIES: no swelling NEUROLOGIC:  Alert and oriented x 3 PSYCHIATRIC:  Normal affect   ASSESSMENT:    1. Late effect of cerebrovascular accident (CVA)   2. Dyslipidemia   3. OSA (obstructive sleep apnea)   4. HTN (hypertension), benign    PLAN:    In order of problems listed above:  Late effect of CVA.  Stable from that point review on antiplatelet therapy.  The key is risk factors modifications. Dyslipidemia: He is on moderate intense statin provide on Crestor 20 and fenofibrate which I will continue.  I did review K PN from April 25 and they which show LDL of 73 HDL 41.  Will continue present management. Obstructive sleep apnea that managed by internal medicine team. Essential hypertension: Blood pressure well-controlled continue present management. Adenocarcinoma of the lung stable.  Followed by oncology   Medication Adjustments/Labs and Tests Ordered: Current medicines are reviewed at length with the patient today.  Concerns regarding  medicines are outlined above.  No orders of the defined types were placed in this encounter.  Medication changes: No orders of the defined types were placed in this encounter.   Signed, Georgeanna Lea, MD, [facility] 04/12/2023 8:30 AM    [facility]

## 2023-04-12 NOTE — Patient Instructions (Addendum)
Medication Instructions:  Your physician recommends that you continue on your current medications as directed. Please refer to the Current Medication list given to you today.  *If you need a refill on your cardiac medications before your next appointment, please call your pharmacy*   Lab Work: None If you have labs (blood work) drawn today and your tests are completely normal, you will receive your results only by: MyChart Message (if you have MyChart) OR A paper copy in the mail If you have any lab test that is abnormal or we need to change your treatment, we will call you to review the results.   Testing/Procedures: None   Follow-Up: At [facility], you and your health needs are our priority.  As part of our continuing mission to provide you with exceptional heart care, we have created designated Provider Care Teams.  These Care Teams include your primary Cardiologist (physician) and Advanced Practice Providers (APPs -  Physician Assistants and Nurse Practitioners) who all work together to provide you with the care you need, when you need it.  We recommend signing up for the patient portal called "MyChart".  Sign up information is provided on this After Visit Summary.  MyChart is used to connect with patients for Virtual Visits (Telemedicine).  Patients are able to view lab/test results, encounter notes, upcoming appointments, etc.  Non-urgent messages can be sent to your provider as well.   To learn more about what you can do with MyChart, go to [email or web address].    Your next appointment:   6 month(s)  Provider:   [name], MD    Other Instructions Patient decline EKG chaperone

## 2023-04-13 ENCOUNTER — Encounter: Payer: Self-pay | Admitting: Cardiovascular Disease

## 2023-04-13 ENCOUNTER — Ambulatory Visit: Payer: Medicare HMO | Admitting: Cardiovascular Disease

## 2023-04-13 VITALS — BP 130/82 | HR 59 | Ht 72.0 in | Wt 259.2 lb

## 2023-04-13 DIAGNOSIS — I639 Cerebral infarction, unspecified: Secondary | ICD-10-CM

## 2023-04-13 NOTE — Patient Instructions (Signed)
Medication Instructions:  Your physician recommends that you continue on your current medications as directed. Please refer to the Current Medication list given to you today.  Labwork: None ordered.  Testing/Procedures: None ordered.  Follow-Up:  Your physician wants you to follow-up in: one year with Dr. Augustus Mealor.  You will receive a reminder letter in the mail two months in advance. If you don't receive a letter, please call our office to schedule the follow-up appointment.  Implantable Loop Recorder Placement, Care After This sheet gives you information about how to care for yourself after your procedure. Your health care provider may also give you more specific instructions. If you have problems or questions, contact your health care provider.  What can I expect after the procedure? After the procedure, it is common to have: Soreness or discomfort near the incision. Some swelling or bruising near the incision.  Follow these instructions at home: Incision care  Monitor your cardiac device site for redness, swelling, and drainage. Call the device clinic at [phone] if you experience these symptoms or fever/chills.  Keep the large square bandage on your site for 24 hours and then you may remove it yourself. Keep the steri-strips underneath in place.   You may shower after 72 hours / 3 days from your procedure with the steri-strips in place. They will usually fall off on their own, or may be removed after 10 days. Pat dry.   Avoid lotions, ointments, or perfumes over your incision until it is well-healed.  Please do not submerge in water until your site is completely healed.   Your device is MRI compatible.   Remote monitoring is used to monitor your cardiac device from home. This monitoring is scheduled every month by our office. It allows us to keep an eye on the function of your device to ensure it is working properly.  If your wound site starts to bleed apply  pressure.    For help with the monitor please call [employer] Monitor Support Specialist directly at [phone].    If you have any questions/concerns please call the device clinic at [phone].  Activity  Return to your normal activities.  General instructions Follow instructions from your health care provider about how to manage your implantable loop recorder and transmit the information. Learn how to activate a recording if this is necessary for your type of device. You may go through a metal detection gate, and you may let someone hold a metal detector over your chest. Show your ID card if needed. Do not have an MRI unless you check with your health care provider first. Take over-the-counter and prescription medicines only as told by your health care provider. Keep all follow-up visits as told by your health care provider. This is important. Contact a health care provider if: You have redness, swelling, or pain around your incision. You have a fever. You have pain that is not relieved by your pain medicine. You have triggered your device because of fainting (syncope) or because of a heartbeat that feels like it is racing, slow, fluttering, or skipping (palpitations). Get help right away if you have: Chest pain. Difficulty breathing. Summary After the procedure, it is common to have soreness or discomfort near the incision. Change your dressing as told by your health care provider. Follow instructions from your health care provider about how to manage your implantable loop recorder and transmit the information. Keep all follow-up visits as told by your health care provider. This is important. This information   is not intended to replace advice given to you by your health care provider. Make sure you discuss any questions you have with your health care provider. Document Released: 10/28/2015 Document Revised: 01/01/2018 Document Reviewed: 01/01/2018 Elsevier Patient Education   2020 [employer]  

## 2023-04-13 NOTE — Progress Notes (Signed)
Electrophysiology Office Note:    Date:  04/13/2023   ID:  Timothy Conway, DOB 21-Sep-1956, MRN [id number]  PCP:  [name], MD   [facility] Providers Cardiologist:  None     Referring MD: [name], MD   History of Present Illness:    Timothy Conway is a 67 y.o. male with a hx listed below, significant for stroke, hypertension, dyslipidemia referred for arrhythmia management.  He is referred for placement of an implantable loop recorder to exclude atrial fibrillation as a cause of cardioembolic stroke.  Past Medical History:  Diagnosis Date   Adenocarcinoma, lung, left (HCC) 07/05/2017   Age-related osteoporosis without current pathological fracture 12/12/2021   Anxiety    COPD (chronic obstructive pulmonary disease) (HCC)    Depression    Dry eye syndrome    Dysrhythmia    hx of PAC's   Effusion of elbow joint, right 06/29/2017   Gastric ulcer with perforation but without obstruction (HCC) 04/15/2016   GERD (gastroesophageal reflux disease)    Gout    Hyperlipidemia    Hypertension    Hypothyroidism    OSA (obstructive sleep apnea) 02/18/2023   Papillary thyroid carcinoma s/p total thyroidectomy & CLND 08/27/2017 08/24/2017   Postsurgical hypothyroidism 10/01/2017   Thyroid cancer (HCC)    2018    Past Surgical History:  Procedure Laterality Date   ABDOMINAL SURGERY     hemmoraging ulcer   BRONCHIAL BIOPSY  06/25/2020   Procedure: BRONCHIAL BIOPSIES;  Surgeon: [name], MD;  Location: [facility] ENDOSCOPY;  Service: Pulmonary;;   BRONCHIAL BRUSHINGS  06/25/2020   Procedure: BRONCHIAL BRUSHINGS;  Surgeon: [name], MD;  Location: [facility] ENDOSCOPY;  Service: Pulmonary;;   BRONCHIAL NEEDLE ASPIRATION BIOPSY  06/25/2020   Procedure: BRONCHIAL NEEDLE ASPIRATION BIOPSIES;  Surgeon: [name], MD;  Location: [facility] ENDOSCOPY;  Service: Pulmonary;;   BRONCHIAL WASHINGS  06/25/2020   Procedure: BRONCHIAL WASHINGS;  Surgeon: [name], MD;   Location: [facility] ENDOSCOPY;  Service: Pulmonary;;   CATARACT EXTRACTION Right    COLONOSCOPY     EYE SURGERY Right    x 4  between ages 30-13   FIDUCIAL MARKER PLACEMENT  06/25/2020   Procedure: FIDUCIAL MARKER PLACEMENT;  Surgeon: [name], MD;  Location: [facility] ENDOSCOPY;  Service: Pulmonary;;   THYROIDECTOMY N/A 08/27/2017   Procedure: TOTAL THYROIDECTOMY WITH LIMITED LYMPH NODE DISSECTION;  Surgeon: [name], MD;  Location: [facility] ORS;  Service: General;  Laterality: N/A;   UPPER GI ENDOSCOPY     VIDEO BRONCHOSCOPY WITH ENDOBRONCHIAL NAVIGATION N/A 06/25/2020   Procedure: VIDEO BRONCHOSCOPY WITH ENDOBRONCHIAL NAVIGATION;  Surgeon: [name], MD;  Location: [facility] ENDOSCOPY;  Service: Pulmonary;  Laterality: N/A;    Current Medications: Current Meds  Medication Sig   alendronate (FOSAMAX) 70 MG tablet TAKE 1 TABLET EVERY 7 DAYS WITH A FULL GLASS OF WATER ON AN EMPTY STOMACH. (Patient taking differently: Take 70 mg by mouth once a week.)   alprazolam (XANAX) 2 MG tablet Take 2 mg by mouth 3 (three) times daily as needed for sleep or anxiety (anxiety.). Pt taking 1/2 to 1 pill three times a day as needed.   aspirin EC 81 MG tablet Take 81 mg by mouth daily. Swallow whole.   atenolol (TENORMIN) 100 MG tablet Take 100 mg by mouth 2 (two) times daily.   CALCIUM PO Take 1 tablet by mouth daily.   Cholecalciferol (VITAMIN D) 2000 units tablet Take 2,000 Units by  mouth daily.   fenofibrate 160 MG tablet Take 160 mg by mouth daily with supper.    hydrocortisone 2.5 % cream Apply 1 application  topically 2 (two) times daily as needed (rash).   levothyroxine (SYNTHROID) 125 MCG tablet Take 1 tablet (125 mcg total) by mouth daily.   Multiple Vitamin (MULTIVITAMIN WITH MINERALS) TABS tablet Take 1 tablet by mouth daily.   omeprazole (PRILOSEC) 20 MG capsule Take 20 mg by mouth daily before breakfast.    rosuvastatin (CRESTOR) 10 MG tablet Take 10 mg by mouth daily.      Allergies:   Patient has no  active allergies.   Social and Family History: Reviewed in Epic  ROS:   Please see the history of present illness.    All other systems reviewed and are negative.  EKGs/Labs/Other Studies Reviewed Today:    Echocardiogram:  TTE 11/2022 Severe asymmetric LVH of the basal-septal segment   Monitors: My interpretation  Zio 14 days 12/2022 Sinus rhythm, HR 39-103, avg 63 There were brief atrial runs but no atrial fibrillation detected  Stress testing:   Advanced imaging:   Cardiac catherization   EKG:  Last EKG results: today -- sinus rhythm with artifact   Recent Labs: 02/18/2023: TSH 0.602 04/09/2023: ALT 9; BUN 12; Creatinine 1.09; Hemoglobin 16.5; Platelet Count 257; Potassium 4.4; Sodium 141     Physical Exam:    VS:  BP 130/82   Pulse (!) 59   Ht 6' (1.829 m)   Wt 259 lb 3.2 oz (117.6 kg)   SpO2 99%   BMI 35.15 kg/m     Wt Readings from Last 3 Encounters:  04/13/23 259 lb 3.2 oz (117.6 kg)  04/12/23 258 lb (117 kg)  04/09/23 261 lb (118.4 kg)     GEN: Well nourished, well developed in no acute distress CARDIAC: RRR, no murmurs, rubs, gallops RESPIRATORY:  Normal work of breathing MUSCULOSKELETAL: no edema    ASSESSMENT & PLAN:    Stroke Possible cardioembolic etiology will place order for monitoring I explained the indication, rationale, and logistics of the implant procedure explained risk including minor infection and bleeding.  I explained the monthly monitoring fee.  He would like to proceed      PROCEDURES:   1. Implantable loop recorder implantation    INTRODUCTION:   Timothy Conway  is a 67 y.o. patient with a history of cryptogenic stroke. Inpatient telemetry has been reviewed and not shown atrial fibrillation. The patient therefore presents today for implantable loop implantation. The costs of loop recorder monitoring have been discussed with the patient.    DESCRIPTION OF PROCEDURE:  Informed written consent was obtained.  A  timeout was performed. The patient required no sedation for the procedure today.  The patients left chest was prepped and draped in the usual sterile fashion. The skin overlying the left parasternal region was infiltrated with lidocaine for local analgesia.  A 0.5-cm incision was made over the left parasternal region over the 3rd intercostal space.  A medtronic implantable loop recorder was then placed into the pocket  R waves were very prominent and measured >0.56mV.  Steri- Strips and a sterile dressing were then applied.  There were no early apparent complications.     CONCLUSIONS:   1. Successful implantation of a Medtronic implantable loop recorder (SN: [id number]) for a history of cryptogenic stroke  2. No early apparent complications.        Medication Adjustments/Labs and Tests Ordered: Current medicines are reviewed  at length with the patient today.  Concerns regarding medicines are outlined above.  No orders of the defined types were placed in this encounter.  No orders of the defined types were placed in this encounter.    Signed, Maurice Small, MD  04/13/2023 12:09 PM    [facility]

## 2023-04-14 ENCOUNTER — Ambulatory Visit: Payer: Medicare HMO

## 2023-04-14 ENCOUNTER — Ambulatory Visit: Payer: Medicare HMO | Admitting: Occupational Therapy

## 2023-04-16 NOTE — Progress Notes (Signed)
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  There is also 50% narrowing of the left carotid artery in the neck.  This can be manage at the present time with medical therapy with aspirin and aggressive risk factor modification.  He will need follow-up imaging at 1 to 2 years.

## 2023-04-16 NOTE — Progress Notes (Signed)
Kindly inform the patient that CT angiogram study of the blood vessels of the neck and the brain show severe narrowing of the origin of the one of the blood vessels in the neck towards the back on the right.  There is around 50% narrowing of the carotid artery in the front in the neck.  Continue ongoing medical therapy for now.  If he has recurrent TIA symptoms related to these vessels may need more aggressive treatment in the future

## 2023-04-19 DIAGNOSIS — H2513 Age-related nuclear cataract, bilateral: Secondary | ICD-10-CM | POA: Diagnosis not present

## 2023-04-19 NOTE — Therapy (Signed)
OUTPATIENT PHYSICAL THERAPY NEURO TREATMENT   Patient Name: Timothy Conway MRN: [id number] DOB:05-16-1956, 67 y.o., male Today's Date: 04/20/2023   PCP: Dibas Koirala REFERRING PROVIDER: Mcneil Sober  END OF SESSION:  PT End of Session - 04/20/23 1058     Visit Number 2    Date for PT Re-Evaluation 05/11/23    PT Start Time 1100    PT Stop Time 1145    PT Time Calculation (min) 45 min              Past Medical History:  Diagnosis Date   Adenocarcinoma, lung, left (HCC) 07/05/2017   Age-related osteoporosis without current pathological fracture 12/12/2021   Anxiety    COPD (chronic obstructive pulmonary disease) (HCC)    Depression    Dry eye syndrome    Dysrhythmia    hx of PAC's   Effusion of elbow joint, right 06/29/2017   Gastric ulcer with perforation but without obstruction (HCC) 04/15/2016   GERD (gastroesophageal reflux disease)    Gout    Hyperlipidemia    Hypertension    Hypothyroidism    OSA (obstructive sleep apnea) 02/18/2023   Papillary thyroid carcinoma s/p total thyroidectomy & CLND 08/27/2017 08/24/2017   Postsurgical hypothyroidism 10/01/2017   Thyroid cancer (HCC)    2018   Past Surgical History:  Procedure Laterality Date   ABDOMINAL SURGERY     hemmoraging ulcer   BRONCHIAL BIOPSY  06/25/2020   Procedure: BRONCHIAL BIOPSIES;  Surgeon: [name], MD;  Location: [facility] ENDOSCOPY;  Service: Pulmonary;;   BRONCHIAL BRUSHINGS  06/25/2020   Procedure: BRONCHIAL BRUSHINGS;  Surgeon: [name], MD;  Location: [facility] ENDOSCOPY;  Service: Pulmonary;;   BRONCHIAL NEEDLE ASPIRATION BIOPSY  06/25/2020   Procedure: BRONCHIAL NEEDLE ASPIRATION BIOPSIES;  Surgeon: [name], MD;  Location: [facility] ENDOSCOPY;  Service: Pulmonary;;   BRONCHIAL WASHINGS  06/25/2020   Procedure: BRONCHIAL WASHINGS;  Surgeon: [name], MD;  Location: [facility] ENDOSCOPY;  Service: Pulmonary;;   CATARACT EXTRACTION Right    COLONOSCOPY     EYE SURGERY Right    x 4  between  ages 27-13   FIDUCIAL MARKER PLACEMENT  06/25/2020   Procedure: FIDUCIAL MARKER PLACEMENT;  Surgeon: [name], MD;  Location: [facility] ENDOSCOPY;  Service: Pulmonary;;   THYROIDECTOMY N/A 08/27/2017   Procedure: TOTAL THYROIDECTOMY WITH LIMITED LYMPH NODE DISSECTION;  Surgeon: [name], MD;  Location: [facility] ORS;  Service: General;  Laterality: N/A;   UPPER GI ENDOSCOPY     VIDEO BRONCHOSCOPY WITH ENDOBRONCHIAL NAVIGATION N/A 06/25/2020   Procedure: VIDEO BRONCHOSCOPY WITH ENDOBRONCHIAL NAVIGATION;  Surgeon: [name], MD;  Location: [facility] ENDOSCOPY;  Service: Pulmonary;  Laterality: N/A;   Patient Active Problem List   Diagnosis Date Noted   Sleep apnea-like behavior 04/08/2023   Excessive daytime sleepiness 02/18/2023   History of gastric ulcer 02/18/2023   HTN (hypertension), benign 02/18/2023   Hx of papillary thyroid carcinoma 02/18/2023   Hypercholesteremia 02/18/2023   Low back pain 02/18/2023   Major depression, single episode 02/18/2023   Morbid obesity (HCC) 02/18/2023   Nodule of left lung 02/18/2023   OSA (obstructive sleep apnea) 02/18/2023   Non-restorative sleep 02/18/2023   Penetrating ulcer of aorta (HCC) 02/18/2023   Protrusion of intervertebral disc of thoracic region 02/18/2023   Seborrheic dermatitis 02/18/2023   Vitamin D deficiency 02/18/2023   Late effect of cerebrovascular accident (CVA) 01/05/2023   Dyslipidemia 01/05/2023   History of smoking 01/05/2023   Age-related  osteoporosis without current pathological fracture 12/12/2021   Postsurgical hypothyroidism 10/01/2017   Papillary thyroid carcinoma s/p total thyroidectomy & CLND 08/27/2017 08/24/2017   Adenocarcinoma, lung, left (HCC) 07/05/2017   COPD (chronic obstructive pulmonary disease) (HCC) 07/05/2017   Effusion of elbow joint, right 06/29/2017   Gastric ulcer with perforation but without obstruction (HCC) 04/15/2016    ONSET DATE: 02/18/23  REFERRING DIAG: I63.9 (ICD-10-CM) - Cerebellar stroke  (HCC)   THERAPY DIAG:  Unsteadiness on feet  Muscle weakness (generalized)  Other abnormalities of gait and mobility  Other lack of coordination  Cerebrovascular accident (CVA), unspecified mechanism (HCC)  Rationale for Evaluation and Treatment: Rehabilitation  SUBJECTIVE:                                                                                                                                                                                             SUBJECTIVE STATEMENT: Going downhill, I have been falling a lot. Sometimes I am bending over, reaching to get something, sometimes I am just walking and I fall. Have not hit my head, I usually fall to the side.   Pt accompanied by: self  PERTINENT HISTORY: He underwent MRI scan of the brain on 11/07/2022 which showed a 1 cm right cerebellar cortical and white matter acute infarct as well as remote age bilateral basal ganglia and corona radiata lacunar infarcts.  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 Pt reports rib pain and pelvis pain from fall approx. 2 months ago. Pt states he did not have imaging  He is completely blind in his right eye.  PAIN:  Are you having pain? Yes: NPRS scale: 5/10 Pain location: R lateral flank and top of iliac crest  Pain description: constant, dull, ache Aggravating factors: if I move the wrong way, standing and sitting  Relieving factors: nothing   PRECAUTIONS: None  WEIGHT BEARING RESTRICTIONS: No  FALLS: Has patient fallen in last 6 months? Yes. Number of falls 3  LIVING ENVIRONMENT: Lives with: lives alone Lives in: House/apartment Stairs: No Has following equipment at home: Single point cane  PLOF: Independent  PATIENT GOALS:  walk straight and not fall  OBJECTIVE:   DIAGNOSTIC FINDINGS:  IMPRESSION: 1. Positive for a small subacute lacunar infarct in the Right cerebellum. No associated hemorrhage or mass effect. And underlying chronic small vessel ischemic disease in the cerebral white matter and basal ganglia.  2. No metastatic disease identified.   3. Bulky degenerative anterior cervical spine spurring at C3-C4.   These results will be called to the ordering clinician or representative by the Radiologist Assistant, and communication documented in the PACS or [employer].  COGNITION: Overall cognitive status: Within functional limits for tasks assessed   SENSATION: WFL  POSTURE: rounded shoulders and forward head  LOWER EXTREMITY ROM:  grossly WFL   LOWER EXTREMITY MMT:  grossly 5/5 all LE   GAIT: Gait pattern: step through pattern, decreased arm swing- Right, decreased arm swing- Left, shuffling, ataxic, narrow BOS, poor foot clearance- Right, and poor foot clearance- Left Distance walked: in clinic distances Assistive device utilized: None Level of assistance: Complete Independence and Modified independence Comments: has some staggering gait an deviates towards   FUNCTIONAL TESTS:  5 times sit to stand: 18.17s Timed up and go (TUG): 13.12s Berg Balance Scale: 49/56 Functional gait assessment: 21/30  TODAY'S TREATMENT:                                                                                                                              DATE:  04/20/23 STS 2x10  Leg ext 10# 2x10 HS curls 25# 2x10 Side steps on beam in II bars Standing 2# marching in II bars Standing hip abd 2#  in II bars  Calf raises 2# in II bars  Bike L3 x50mins    03/16/23- EVAL    PATIENT EDUCATION: Education details: POC and HEP, importance of using a cane for safety reasons and to prevent more falls Person educated: Patient Education method: Explanation Education comprehension: verbalized  understanding  HOME EXERCISE PROGRAM: Access Code: [email or web address] URL: [email or web address] Date: 03/16/2023 Prepared by: Cassie Freer  Exercises - Mini Squat with Counter Support  - 1 x daily - 7 x weekly - 2 sets - 10 reps - Standing March with Counter Support  - 1 x daily - 7 x weekly - 2 sets - 10 reps - Sit to Stand  - 1 x daily - 7 x weekly - 2 sets - 10 reps - Heel Raises with Counter Support  - 1 x daily - 7 x weekly - 2 sets - 10 reps - Standing Tandem Balance with Counter Support  - 1 x daily - 7 x weekly - 15-30 hold  GOALS: Goals reviewed with patient? Yes  SHORT TERM GOALS: Target date: 04/13/23  Patient will be independent with initial HEP. Goal status: IN PROGRESS  2.  Patient will be educated on strategies to decrease risk of falls.  Goal status: IN PROGRESS   LONG TERM GOALS: Target date: 05/11/23  Patient will be independent with advanced/ongoing HEP to improve outcomes and carryover.  Goal status: INITIAL  2.  Patient will be able to ambulate without gait deviations and staggering.  Baseline: loses balance and deviates to the L  Goal status: INITIAL   3.  Patient will demonstrate improved functional LE strength and  decreased fall risk by scoring < 12 sec on 5xSTS. Baseline: 18.17 Goal status: INITIAL.  4.  Patient will demonstrate at least 26/30 on FGA to improve gait stability and reduce risk for falls. Baseline: 21/30 Goal status: INITIAL  ASSESSMENT:  CLINICAL IMPRESSION: Patient is a 67 y.o. male who was seen today for physical therapy treatment for CVA. He returned for the first time in over a month and reports he is not doing well and has had at least 6 falls. Still having some pain in the ribs. We focused on some light strengthening and balance in the parallel bars. He is very unsteady on his feet and using a SPC that is too low. Was advised to try a walker or a different cane that can be adjusted to his height. Continue to work on  balance to decrease fall risk   OBJECTIVE IMPAIRMENTS: Abnormal gait, decreased balance, decreased coordination, and pain.   REHAB POTENTIAL: Good  CLINICAL DECISION MAKING: Stable/uncomplicated  EVALUATION COMPLEXITY: Low  PLAN:  PT FREQUENCY: 1x/week  PT DURATION: 8 weeks  PLANNED INTERVENTIONS: Therapeutic exercises, Therapeutic activity, Neuromuscular re-education, Balance training, Gait training, Patient/Family education, Self Care, Joint mobilization, Stair training, Cryotherapy, Moist heat, and Manual therapy  PLAN FOR NEXT SESSION: gym activities to work on balance and [employer], PT 04/20/2023, 11:41 AM

## 2023-04-19 NOTE — Therapy (Incomplete)
OUTPATIENT OCCUPATIONAL THERAPY NEURO EVALUATION  Patient Name: DAELEN SEANG MRN: [id number] DOB:[birth year], 67 y.o., male Today's Date: 04/20/2023  PCP: Dr. Docia Chuck REFERRING PROVIDER: Dr. Pearlean Brownie  END OF SESSION:  OT End of Session - 04/20/23 1020     Visit Number 2    Number of Visits 9    Date for OT Re-Evaluation 05/17/23    Authorization Type [employer]    Authorization - Visit Number 2    Progress Note Due on Visit 10    OT Start Time 1017    OT Stop Time 1100    OT Time Calculation (min) 43 min              Past Medical History:  Diagnosis Date   Adenocarcinoma, lung, left (HCC) 07/05/2017   Age-related osteoporosis without current pathological fracture 12/12/2021   Anxiety    COPD (chronic obstructive pulmonary disease) (HCC)    Depression    Dry eye syndrome    Dysrhythmia    hx of PAC's   Effusion of elbow joint, right 06/29/2017   Gastric ulcer with perforation but without obstruction (HCC) 04/15/2016   GERD (gastroesophageal reflux disease)    Gout    Hyperlipidemia    Hypertension    Hypothyroidism    OSA (obstructive sleep apnea) 02/18/2023   Papillary thyroid carcinoma s/p total thyroidectomy & CLND 08/27/2017 08/24/2017   Postsurgical hypothyroidism 10/01/2017   Thyroid cancer (HCC)    2018   Past Surgical History:  Procedure Laterality Date   ABDOMINAL SURGERY     hemmoraging ulcer   BRONCHIAL BIOPSY  06/25/2020   Procedure: BRONCHIAL BIOPSIES;  Surgeon: [name], MD;  Location: [facility] ENDOSCOPY;  Service: Pulmonary;;   BRONCHIAL BRUSHINGS  06/25/2020   Procedure: BRONCHIAL BRUSHINGS;  Surgeon: [name], MD;  Location: [facility] ENDOSCOPY;  Service: Pulmonary;;   BRONCHIAL NEEDLE ASPIRATION BIOPSY  06/25/2020   Procedure: BRONCHIAL NEEDLE ASPIRATION BIOPSIES;  Surgeon: [name], MD;  Location: [facility] ENDOSCOPY;  Service: Pulmonary;;   BRONCHIAL WASHINGS  06/25/2020   Procedure: BRONCHIAL WASHINGS;  Surgeon: [name], MD;   Location: [facility] ENDOSCOPY;  Service: Pulmonary;;   CATARACT EXTRACTION Right    COLONOSCOPY     EYE SURGERY Right    x 4  between ages 1-13   FIDUCIAL MARKER PLACEMENT  06/25/2020   Procedure: FIDUCIAL MARKER PLACEMENT;  Surgeon: [name], MD;  Location: [facility] ENDOSCOPY;  Service: Pulmonary;;   THYROIDECTOMY N/A 08/27/2017   Procedure: TOTAL THYROIDECTOMY WITH LIMITED LYMPH NODE DISSECTION;  Surgeon: [name], MD;  Location: [facility] ORS;  Service: General;  Laterality: N/A;   UPPER GI ENDOSCOPY     VIDEO BRONCHOSCOPY WITH ENDOBRONCHIAL NAVIGATION N/A 06/25/2020   Procedure: VIDEO BRONCHOSCOPY WITH ENDOBRONCHIAL NAVIGATION;  Surgeon: [name], MD;  Location: [facility] ENDOSCOPY;  Service: Pulmonary;  Laterality: N/A;   Patient Active Problem List   Diagnosis Date Noted   Sleep apnea-like behavior 04/08/2023   Excessive daytime sleepiness 02/18/2023   History of gastric ulcer 02/18/2023   HTN (hypertension), benign 02/18/2023   Hx of papillary thyroid carcinoma 02/18/2023   Hypercholesteremia 02/18/2023   Low back pain 02/18/2023   Major depression, single episode 02/18/2023   Morbid obesity (HCC) 02/18/2023   Nodule of left lung 02/18/2023   OSA (obstructive sleep apnea) 02/18/2023   Non-restorative sleep 02/18/2023   Penetrating ulcer of aorta (HCC) 02/18/2023   Protrusion of intervertebral disc of thoracic region 02/18/2023   Seborrheic dermatitis  02/18/2023   Vitamin D deficiency 02/18/2023   Late effect of cerebrovascular accident (CVA) 01/05/2023   Dyslipidemia 01/05/2023   History of smoking 01/05/2023   Age-related osteoporosis without current pathological fracture 12/12/2021   Postsurgical hypothyroidism 10/01/2017   Papillary thyroid carcinoma s/p total thyroidectomy & CLND 08/27/2017 08/24/2017   Adenocarcinoma, lung, left (HCC) 07/05/2017   COPD (chronic obstructive pulmonary disease) (HCC) 07/05/2017   Effusion of elbow joint, right 06/29/2017   Gastric ulcer with  perforation but without obstruction (HCC) 04/15/2016    ONSET DATE: 02/18/23- referral date  REFERRING DIAG: I63.9 (ICD-10-CM) - Cerebellar stroke (HCC)  THERAPY DIAG:  Unsteadiness on feet  Muscle weakness (generalized)  Other abnormalities of gait and mobility  Other lack of coordination  Attention and concentration deficit  Frontal lobe and executive function deficit  Rationale for Evaluation and Treatment: Rehabilitation  SUBJECTIVE:   SUBJECTIVE STATEMENT: Pt reports back and hip pain Pt accompanied by: self  PERTINENT HISTORY: He underwent MRI scan of the brain on 11/07/2022 which showed a 1 cm right cerebellar cortical and white matter acute infarct as well as remote age bilateral basal ganglia and corona radiata lacunar infarcts.  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 Pt reports rib pain and pelvis pain from fall approx. 2 months ago.Pt states he did not have imaging   PRECAUTIONS: Fall  WEIGHT BEARING RESTRICTIONS: No  PAIN:  Are you having pain? Yes: NPRS scale: 5/10 Pain location: rib and pelvis Pain description: aching Aggravating factors: moving  Relieving factors: sitting  FALLS: Has patient fallen in last 6 months? Yes. Number of falls 3  LIVING ENVIRONMENT: Lives with: lives alone Lives in: House/apartment Stairs: No Has following equipment at home: Single point cane and Grab bars  PLOF: Independent  PATIENT GOALS: increase safety and I with ADLS  OBJECTIVE:   HAND DOMINANCE: Right  ADLs: Overall ADLs: increased time required Transfers/ambulation related to ADLs:fall risk Eating: mod I Grooming: mod I UB Dressing: mod I LB Dressing: increased time required Toileting: difficulty  getting up and down from commode Bathing: stands in shower, does not have seat Tub Shower transfers: performs mod I however would benefit from tub transfer bench Equipment: Grab bars  IADLs: Shopping: pt does his own shopping Light housekeeping: mod I Meal Prep: uses microwave to heat items Community mobility: mod I with hx of falls Medication management: Pt handles his own meds, pt reports sometimes increased difficulty remembering to take Financial management: Pt handles, online whenever possible Handwriting: 100% legible  MOBILITY STATUS: Hx of falls and pt is unsteady without assistive device  Standing functional reach: 10" bilaterally    ACTIVITY TOLERANCE: Activity tolerance: Pt reports that he can stand for activity for approximately 45 mins    UPPER EXTREMITY ROM:  WFLS  (Blank rows = not tested)  UPPER EXTREMITY MMT:     MMT Right eval Left eval  Shoulder flexion 5/5 5/5  Shoulder abduction    Shoulder adduction    Shoulder extension    Shoulder internal rotation    Shoulder external rotation    Middle trapezius    Lower trapezius    Elbow flexion 5/5 4+/5  Elbow extension 4+/5 4+/5  Wrist flexion    Wrist extension    Wrist ulnar deviation  Wrist radial deviation    Wrist pronation    Wrist supination    (Blank rows = not tested)  HAND FUNCTION: Grip strength: Right: 65 lbs; Left: 55 lbs  COORDINATION: 9 hole peg test  SENSATION: Not tested   COGNITION: Overall cognitive status: decreased processing speed, pt reports ST memory deficits, however he was able to recall 3/3 words following short delay, and he was able to spell WORLD backwards  VISION: Subjective report: blind in R eye  Visual history:  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  VISION ASSESSMENT: Not tested- Pt reports he thinks he is due to see eye doctor and has mild blurriness, will further assess in a functional context     OBSERVATIONS: Pt is unsteady with  ambulation   TODAY'S TREATMENT:                                                                                                                              DATE: 04/20/23- Pt was educated in coordination HEP for bilateral UE's min difficulty with RUE, mod difficulty/ v.c for LUE  PATIENT EDUCATION: Education details: coordination and putty HEP- see pt instructions, tub transfer bench information issued and discussed with pt to minimize fall risk  Person educated: Patient Education method: Explanation, Demonstration, Verbal cues, and Handouts Education comprehension: verbalized understanding, returned demonstration, and verbal cues required   HOME EXERCISE PROGRAM: 04/20/23- coordination and putty HEP   GOALS: Goals reviewed with patient? Yes  SHORT TERM GOALS: Target date: 04/13/23  I with initial HEP.  Goal status:  ongoing, issued 04/20/23  2.  I with adapted strategies, AE and DME to maximize safety and I with ADLs. ( Consider tub bench and  3 in 1)  Goal status: ongoing, information issued regarding tub transfer bench 04/20/23  3.  Pt will verbalize understanding of compensatory strategies for short term memory/ cognitive deficits.  Goal status: INITIAL  4.  Pt will increase LUE grip strength by 5 lbs for increased functional use. Baseline: RUE 65 lbs, LUE- 55 lbs Goal status: INITIAL    LONG TERM GOALS: Target date: 05/17/23  I with updated HEP  Goal status: INITIAL  2.  Pt will demonstrate improved fine motor coordination for ADLs as evidenced by decreasing 9 hole peg test score by 3 secs bilaterally. Baseline:RUE 52.25 secs, LUE 55.06 secs Goal status: INITIAL  3.  Pt will perform transitional movements for ADLs without LOB using UE support. prn  Goal status: INITIAL    ASSESSMENT:  CLINICAL IMPRESSION: Pt is progressing towards goals. He demonstrates understanding of initial HEP for coordination and red putty. Pt reports recent falls and therefore  therapist recommends pt considers a tub transfer bench.   PERFORMANCE DEFICITS: in functional skills including ADLs, IADLs, coordination, dexterity, ROM, strength, flexibility, Fine motor control, mobility, balance, endurance, decreased knowledge of precautions, decreased knowledge of use of DME, vision, and UE functional use, cognitive skills including memory, problem solving, safety  awareness, and thought, and psychosocial skills including coping strategies, environmental adaptation, habits, interpersonal interactions, and routines and behaviors.   IMPAIRMENTS: are limiting patient from ADLs, IADLs, rest and sleep, education, play, leisure, and social participation.   CO-MORBIDITIES: may have co-morbidities  that affects occupational performance. Patient will benefit from skilled OT to address above impairments and improve overall function.  MODIFICATION OR ASSISTANCE TO COMPLETE EVALUATION: No modification of tasks or assist necessary to complete an evaluation.  OT OCCUPATIONAL PROFILE AND HISTORY: Detailed assessment: Review of records and additional review of physical, cognitive, psychosocial history related to current functional performance.  CLINICAL DECISION MAKING: LOW - limited treatment options, no task modification necessary  REHAB POTENTIAL: Good  EVALUATION COMPLEXITY: Low    PLAN:  OT FREQUENCY: 1x/week plus eval  OT DURATION: 8 weeks  PLANNED INTERVENTIONS: self care/ADL training, therapeutic exercise, therapeutic activity, neuromuscular re-education, manual therapy, passive range of motion, balance training, functional mobility training, paraffin, moist heat, cryotherapy, contrast bath, patient/family education, cognitive remediation/compensation, visual/perceptual remediation/compensation, energy conservation, coping strategies training, DME and/or AE instructions, and Re-evaluation  RECOMMENDED OTHER SERVICES: PT  CONSULTED AND AGREED WITH PLAN OF CARE:  Patient  PLAN FOR NEXT SESSION: can review HEP, large pegs, gripper,  coordination/ grip strength,  safety for ADLS  Leonetta Mcgivern, OT 04/20/2023, 10:22 AM

## 2023-04-20 ENCOUNTER — Ambulatory Visit: Payer: Medicare HMO

## 2023-04-20 ENCOUNTER — Ambulatory Visit: Payer: Medicare HMO | Attending: Neurology | Admitting: Occupational Therapy

## 2023-04-20 DIAGNOSIS — I639 Cerebral infarction, unspecified: Secondary | ICD-10-CM

## 2023-04-20 DIAGNOSIS — R2689 Other abnormalities of gait and mobility: Secondary | ICD-10-CM

## 2023-04-20 DIAGNOSIS — R2681 Unsteadiness on feet: Secondary | ICD-10-CM

## 2023-04-20 DIAGNOSIS — R41840 Attention and concentration deficit: Secondary | ICD-10-CM

## 2023-04-20 DIAGNOSIS — M6281 Muscle weakness (generalized): Secondary | ICD-10-CM

## 2023-04-20 DIAGNOSIS — R41844 Frontal lobe and executive function deficit: Secondary | ICD-10-CM | POA: Diagnosis not present

## 2023-04-20 DIAGNOSIS — R278 Other lack of coordination: Secondary | ICD-10-CM

## 2023-04-20 NOTE — Patient Instructions (Signed)
  Coordination Activities  Perform the following activities for 20 minutes 1 times per day with both hand(s).  Rotate ball in fingertips (clockwise and counter-clockwise). Do not chase ball if it drops, I don't want you to fall bending over for ball. Perform over a table Toss ball between hands. Flip cards 1 at a time  Deal cards with your thumb (Hold deck in hand and push card off top with thumb). Pick up coins and place in container or coin bank., can use quarters for Left hand Pick up coins and stack.  1. Grip Strengthening (Resistive Putty)   Squeeze putty using thumb and all fingers. Repeat _20___ times. Do __1__ sessions per day.   2. Roll putty into tube on table and pinch between  each fingers and thumb x 10 reps. Do 1 sessions per day     Copyright  VHI. All rights reserved.

## 2023-04-21 DIAGNOSIS — Z0100 Encounter for examination of eyes and vision without abnormal findings: Secondary | ICD-10-CM | POA: Diagnosis not present

## 2023-04-23 NOTE — Therapy (Signed)
OUTPATIENT PHYSICAL THERAPY NEURO TREATMENT   Patient Name: Timothy Conway MRN: [id number] DOB:12-14-1955, 67 y.o., male Today's Date: 04/20/2023   PCP: Dibas Koirala REFERRING PROVIDER: Mcneil Sober  END OF SESSION:  PT End of Session - 04/20/23 1058     Visit Number 2    Date for PT Re-Evaluation 05/11/23    PT Start Time 1100    PT Stop Time 1145    PT Time Calculation (min) 45 min              Past Medical History:  Diagnosis Date   Adenocarcinoma, lung, left (HCC) 07/05/2017   Age-related osteoporosis without current pathological fracture 12/12/2021   Anxiety    COPD (chronic obstructive pulmonary disease) (HCC)    Depression    Dry eye syndrome    Dysrhythmia    hx of PAC's   Effusion of elbow joint, right 06/29/2017   Gastric ulcer with perforation but without obstruction (HCC) 04/15/2016   GERD (gastroesophageal reflux disease)    Gout    Hyperlipidemia    Hypertension    Hypothyroidism    OSA (obstructive sleep apnea) 02/18/2023   Papillary thyroid carcinoma s/p total thyroidectomy & CLND 08/27/2017 08/24/2017   Postsurgical hypothyroidism 10/01/2017   Thyroid cancer (HCC)    2018   Past Surgical History:  Procedure Laterality Date   ABDOMINAL SURGERY     hemmoraging ulcer   BRONCHIAL BIOPSY  06/25/2020   Procedure: BRONCHIAL BIOPSIES;  Surgeon: [name], MD;  Location: [facility] ENDOSCOPY;  Service: Pulmonary;;   BRONCHIAL BRUSHINGS  06/25/2020   Procedure: BRONCHIAL BRUSHINGS;  Surgeon: [name], MD;  Location: [facility] ENDOSCOPY;  Service: Pulmonary;;   BRONCHIAL NEEDLE ASPIRATION BIOPSY  06/25/2020   Procedure: BRONCHIAL NEEDLE ASPIRATION BIOPSIES;  Surgeon: [name], MD;  Location: [facility] ENDOSCOPY;  Service: Pulmonary;;   BRONCHIAL WASHINGS  06/25/2020   Procedure: BRONCHIAL WASHINGS;  Surgeon: [name], MD;  Location: [facility] ENDOSCOPY;  Service: Pulmonary;;   CATARACT EXTRACTION Right    COLONOSCOPY     EYE SURGERY Right    x 4  between  ages 86-13   FIDUCIAL MARKER PLACEMENT  06/25/2020   Procedure: FIDUCIAL MARKER PLACEMENT;  Surgeon: [name], MD;  Location[facility] ENDOSCOPY;  Service: Pulmonary;;   THYROIDECTOMY N/A 08/27/2017   Procedure: TOTAL THYROIDECTOMY WITH LIMITED LYMPH NODE DISSECTION;  Surgeon: [name], MD;  Location: [facility] ORS;  Service: General;  Laterality: N/A;   UPPER GI ENDOSCOPY     VIDEO BRONCHOSCOPY WITH ENDOBRONCHIAL NAVIGATION N/A 06/25/2020   Procedure: VIDEO BRONCHOSCOPY WITH ENDOBRONCHIAL NAVIGATION;  Surgeon: [name], MD;  Location: [facility] ENDOSCOPY;  Service: Pulmonary;  Laterality: N/A;   Patient Active Problem List   Diagnosis Date Noted   Sleep apnea-like behavior 04/08/2023   Excessive daytime sleepiness 02/18/2023   History of gastric ulcer 02/18/2023   HTN (hypertension), benign 02/18/2023   Hx of papillary thyroid carcinoma 02/18/2023   Hypercholesteremia 02/18/2023   Low back pain 02/18/2023   Major depression, single episode 02/18/2023   Morbid obesity (HCC) 02/18/2023   Nodule of left lung 02/18/2023   OSA (obstructive sleep apnea) 02/18/2023   Non-restorative sleep 02/18/2023   Penetrating ulcer of aorta (HCC) 02/18/2023   Protrusion of intervertebral disc of thoracic region 02/18/2023   Seborrheic dermatitis 02/18/2023   Vitamin D deficiency 02/18/2023   Late effect of cerebrovascular accident (CVA) 01/05/2023   Dyslipidemia 01/05/2023   History of smoking 01/05/2023   Age-related  osteoporosis without current pathological fracture 12/12/2021   Postsurgical hypothyroidism 10/01/2017   Papillary thyroid carcinoma s/p total thyroidectomy & CLND 08/27/2017 08/24/2017   Adenocarcinoma, lung, left (HCC) 07/05/2017   COPD (chronic obstructive pulmonary disease) (HCC) 07/05/2017   Effusion of elbow joint, right 06/29/2017   Gastric ulcer with perforation but without obstruction (HCC) 04/15/2016    ONSET DATE: 02/18/23  REFERRING DIAG: I63.9 (ICD-10-CM) - Cerebellar stroke  (HCC)   THERAPY DIAG:  Unsteadiness on feet  Muscle weakness (generalized)  Other abnormalities of gait and mobility  Other lack of coordination  Cerebrovascular accident (CVA), unspecified mechanism (HCC)  Rationale for Evaluation and Treatment: Rehabilitation  SUBJECTIVE:                                                                                                                                                                                             SUBJECTIVE STATEMENT: I am doing okay, my balance seems much better. No new falls. Ordered a new adjustable cane  Pt accompanied by: self  PERTINENT HISTORY: He underwent MRI scan of the brain on 11/07/2022 which showed a 1 cm right cerebellar cortical and white matter acute infarct as well as remote age bilateral basal ganglia and corona radiata lacunar infarcts.  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 Pt reports rib pain and pelvis pain from fall approx. 2 months ago. Pt states he did not have imaging  He is completely blind in his right eye.  PAIN:  Are you having pain? Yes: NPRS scale: 5/10 Pain location: R lateral flank and top of iliac crest  Pain description: constant, dull, ache Aggravating factors: if I move the wrong way, standing and sitting  Relieving factors: nothing   PRECAUTIONS: None  WEIGHT BEARING RESTRICTIONS: No  FALLS: Has patient fallen in last 6 months? Yes. Number of falls 3  LIVING ENVIRONMENT: Lives with: lives alone Lives in: House/apartment Stairs: No Has following equipment at home: Single point cane  PLOF: Independent  PATIENT GOALS: walk straight and not fall  OBJECTIVE:   DIAGNOSTIC FINDINGS:  IMPRESSION: 1. Positive for a  small subacute lacunar infarct in the Right cerebellum. No associated hemorrhage or mass effect. And underlying chronic small vessel ischemic disease in the cerebral white matter and basal ganglia.   2. No metastatic disease identified.   3. Bulky degenerative anterior cervical spine spurring at C3-C4.   These results  will be called to the ordering clinician or representative by the Radiologist Assistant, and communication documented in the PACS or [employer].  COGNITION: Overall cognitive status: Within functional limits for tasks assessed   SENSATION: WFL  POSTURE: rounded shoulders and forward head  LOWER EXTREMITY ROM:  grossly WFL   LOWER EXTREMITY MMT:  grossly 5/5 all LE   GAIT: Gait pattern: step through pattern, decreased arm swing- Right, decreased arm swing- Left, shuffling, ataxic, narrow BOS, poor foot clearance- Right, and poor foot clearance- Left Distance walked: in clinic distances Assistive device utilized: None Level of assistance: Complete Independence and Modified independence Comments: has some staggering gait an deviates towards   FUNCTIONAL TESTS:  5 times sit to stand: 18.17s Timed up and go (TUG): 13.12s Berg Balance Scale: 49/56 Functional gait assessment: 21/30  TODAY'S TREATMENT:                                                                                                                              DATE:  04/27/23 STS with chest press 2x10  Leg press 20# 2x10  Side steps on beam  Tandem on beam 30s each side  NuStep L5 x61mins  Calf raises 2x10 Shoulder 10# 2x10  Leg ext 10# 2x10 HS curls 25# 2x10 Step ups 4" x10 each side holding on to rails  Side steps on airex  Fitter pushes 1 blue and 1 black band 2x10  04/20/23 STS 2x10  Leg ext 10# 2x10 HS curls 25# 2x10 Side steps on beam in II bars Standing 2# marching in II bars Standing hip abd 2#  in II bars  Calf raises 2# in II bars  Bike L3 x26mins    03/16/23- EVAL     PATIENT EDUCATION: Education details: POC and HEP, importance of using a cane for safety reasons and to prevent more falls Person educated: Patient Education method: Explanation Education comprehension: verbalized understanding  HOME EXERCISE PROGRAM: Access Code: [email or web address] URL: [email or web address] Date: 03/16/2023 Prepared by: Cassie Freer  Exercises - Mini Squat with Counter Support  - 1 x daily - 7 x weekly - 2 sets - 10 reps - Standing March with Counter Support  - 1 x daily - 7 x weekly - 2 sets - 10 reps - Sit to Stand  - 1 x daily - 7 x weekly - 2 sets - 10 reps - Heel Raises with Counter Support  - 1 x daily - 7 x weekly - 2 sets - 10 reps - Standing Tandem Balance with Counter Support  - 1 x daily - 7 x weekly - 15-30 hold  GOALS: Goals reviewed with patient? Yes  SHORT TERM GOALS: Target date: 04/13/23  Patient will be independent with initial HEP. Goal status: IN PROGRESS  2.  Patient will be educated on strategies to decrease risk of falls.  Goal status: IN PROGRESS   LONG TERM GOALS: Target date: 05/11/23  Patient will be independent with advanced/ongoing  HEP to improve outcomes and carryover.  Goal status: INITIAL  2.  Patient will be able to ambulate without gait deviations and staggering.  Baseline: loses balance and deviates to the L  Goal status: INITIAL   3.  Patient will demonstrate improved functional LE strength and decreased fall risk by scoring < 12 sec on 5xSTS. Baseline: 18.17 Goal status: INITIAL.  4.  Patient will demonstrate at least 26/30 on FGA to improve gait stability and reduce risk for falls. Baseline: 21/30 Goal status: INITIAL  ASSESSMENT:  CLINICAL IMPRESSION: Patient was seen today for physical therapy treatment for CVA. We continued to focus on some strengthening and balance in the parallel bars. Still pretty unsteady on his feet, but walking better than last visit. Continue to work on balance to decrease  fall risk  OBJECTIVE IMPAIRMENTS: Abnormal gait, decreased balance, decreased coordination, and pain.   REHAB POTENTIAL: Good  CLINICAL DECISION MAKING: Stable/uncomplicated  EVALUATION COMPLEXITY: Low  PLAN:  PT FREQUENCY: 1x/week  PT DURATION: 8 weeks  PLANNED INTERVENTIONS: Therapeutic exercises, Therapeutic activity, Neuromuscular re-education, Balance training, Gait training, Patient/Family education, Self Care, Joint mobilization, Stair training, Cryotherapy, Moist heat, and Manual therapy  PLAN FOR NEXT SESSION: gym activities to work on balance and [employer], PT 04/20/2023, 11:41 AM

## 2023-04-27 ENCOUNTER — Ambulatory Visit: Payer: Medicare HMO

## 2023-04-27 ENCOUNTER — Ambulatory Visit: Payer: Medicare HMO | Admitting: Occupational Therapy

## 2023-04-27 DIAGNOSIS — R41844 Frontal lobe and executive function deficit: Secondary | ICD-10-CM | POA: Diagnosis not present

## 2023-04-27 DIAGNOSIS — R278 Other lack of coordination: Secondary | ICD-10-CM | POA: Diagnosis not present

## 2023-04-27 DIAGNOSIS — R2689 Other abnormalities of gait and mobility: Secondary | ICD-10-CM | POA: Diagnosis not present

## 2023-04-27 DIAGNOSIS — M6281 Muscle weakness (generalized): Secondary | ICD-10-CM | POA: Diagnosis not present

## 2023-04-27 DIAGNOSIS — I639 Cerebral infarction, unspecified: Secondary | ICD-10-CM

## 2023-04-27 DIAGNOSIS — R41840 Attention and concentration deficit: Secondary | ICD-10-CM | POA: Diagnosis not present

## 2023-04-27 DIAGNOSIS — R2681 Unsteadiness on feet: Secondary | ICD-10-CM | POA: Diagnosis not present

## 2023-04-28 ENCOUNTER — Other Ambulatory Visit: Payer: Self-pay | Admitting: Internal Medicine

## 2023-04-28 DIAGNOSIS — C73 Malignant neoplasm of thyroid gland: Secondary | ICD-10-CM

## 2023-05-03 NOTE — Therapy (Signed)
OUTPATIENT PHYSICAL THERAPY NEURO TREATMENT   Patient Name: Timothy Conway MRN: [id number] DOB:[birth year], 67 y.o., male Today's Date: 05/03/2023   PCP: Dibas Koirala REFERRING PROVIDER: Mcneil Sober  END OF SESSION:     Past Medical History:  Diagnosis Date   Adenocarcinoma, lung, left (HCC) 07/05/2017   Age-related osteoporosis without current pathological fracture 12/12/2021   Anxiety    COPD (chronic obstructive pulmonary disease) (HCC)    Depression    Dry eye syndrome    Dysrhythmia    hx of PAC's   Effusion of elbow joint, right 06/29/2017   Gastric ulcer with perforation but without obstruction (HCC) 04/15/2016   GERD (gastroesophageal reflux disease)    Gout    Hyperlipidemia    Hypertension    Hypothyroidism    OSA (obstructive sleep apnea) 02/18/2023   Papillary thyroid carcinoma s/p total thyroidectomy & CLND 08/27/2017 08/24/2017   Postsurgical hypothyroidism 10/01/2017   Thyroid cancer (HCC)    2018   Past Surgical History:  Procedure Laterality Date   ABDOMINAL SURGERY     hemmoraging ulcer   BRONCHIAL BIOPSY  06/25/2020   Procedure: BRONCHIAL BIOPSIES;  Surgeon: [name], MD;  Location: [facility] ENDOSCOPY;  Service: Pulmonary;;   BRONCHIAL BRUSHINGS  06/25/2020   Procedure: BRONCHIAL BRUSHINGS;  Surgeon: [name], MD;  Location: [facility] ENDOSCOPY;  Service: Pulmonary;;   BRONCHIAL NEEDLE ASPIRATION BIOPSY  06/25/2020   Procedure: BRONCHIAL NEEDLE ASPIRATION BIOPSIES;  Surgeon: [name], MD;  Location: [facility] ENDOSCOPY;  Service: Pulmonary;;   BRONCHIAL WASHINGS  06/25/2020   Procedure: BRONCHIAL WASHINGS;  Surgeon: [name], MD;  Location: [facility] ENDOSCOPY;  Service: Pulmonary;;   CATARACT EXTRACTION Right    COLONOSCOPY     EYE SURGERY Right    x 4  between ages 75-13   FIDUCIAL MARKER PLACEMENT  06/25/2020   Procedure: FIDUCIAL MARKER PLACEMENT;  Surgeon: [name], MD;  Location: [facility] ENDOSCOPY;  Service: Pulmonary;;   THYROIDECTOMY N/A  08/27/2017   Procedure: TOTAL THYROIDECTOMY WITH LIMITED LYMPH NODE DISSECTION;  Surgeon: [name], MD;  Location: [facility] ORS;  Service: General;  Laterality: N/A;   UPPER GI ENDOSCOPY     VIDEO BRONCHOSCOPY WITH ENDOBRONCHIAL NAVIGATION N/A 06/25/2020   Procedure: VIDEO BRONCHOSCOPY WITH ENDOBRONCHIAL NAVIGATION;  Surgeon: [name], MD;  Location: [facility] ENDOSCOPY;  Service: Pulmonary;  Laterality: N/A;   Patient Active Problem List   Diagnosis Date Noted   Sleep apnea-like behavior 04/08/2023   Excessive daytime sleepiness 02/18/2023   History of gastric ulcer 02/18/2023   HTN (hypertension), benign 02/18/2023   Hx of papillary thyroid carcinoma 02/18/2023   Hypercholesteremia 02/18/2023   Low back pain 02/18/2023   Major depression, single episode 02/18/2023   Morbid obesity (HCC) 02/18/2023   Nodule of left lung 02/18/2023   OSA (obstructive sleep apnea) 02/18/2023   Non-restorative sleep 02/18/2023   Penetrating ulcer of aorta (HCC) 02/18/2023   Protrusion of intervertebral disc of thoracic region 02/18/2023   Seborrheic dermatitis 02/18/2023   Vitamin D deficiency 02/18/2023   Late effect of cerebrovascular accident (CVA) 01/05/2023   Dyslipidemia 01/05/2023   History of smoking 01/05/2023   Age-related osteoporosis without current pathological fracture 12/12/2021   Postsurgical hypothyroidism 10/01/2017   Papillary thyroid carcinoma s/p total thyroidectomy & CLND 08/27/2017 08/24/2017   Adenocarcinoma, lung, left (HCC) 07/05/2017   COPD (chronic obstructive pulmonary disease) (HCC) 07/05/2017   Effusion of elbow joint, right 06/29/2017   Gastric ulcer with perforation but without  obstruction (HCC) 04/15/2016    ONSET DATE: 02/18/23  REFERRING DIAG: I63.9 (ICD-10-CM) - Cerebellar stroke (HCC)   THERAPY DIAG:  No diagnosis found.  Rationale for Evaluation and Treatment: Rehabilitation  SUBJECTIVE:                                                                                                                                                                                              SUBJECTIVE STATEMENT: I am doing okay, no falls.   Pt accompanied by: self  PERTINENT HISTORY: He underwent MRI scan of the brain on 11/07/2022 which showed a 1 cm right cerebellar cortical and white matter acute infarct as well as remote age bilateral basal ganglia and corona radiata lacunar infarcts.  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 Pt reports rib pain and pelvis pain from fall approx. 2 months ago. Pt states he did not have imaging  He is completely blind in his right eye.  PAIN:  Are you having pain? Yes: NPRS scale: 5/10 Pain location: R lateral flank and top of iliac crest  Pain description: constant, dull, ache Aggravating factors: if I move the wrong way, standing and sitting  Relieving factors: nothing   PRECAUTIONS: None  WEIGHT BEARING RESTRICTIONS: No  FALLS: Has patient fallen in last 6 months? Yes. Number of falls 3  LIVING ENVIRONMENT: Lives with: lives alone Lives in: House/apartment Stairs: No Has following equipment at home: Single point cane  PLOF: Independent  PATIENT GOALS: walk straight and not fall  OBJECTIVE:   DIAGNOSTIC FINDINGS:  IMPRESSION: 1. Positive for a small subacute lacunar infarct in the Right cerebellum. No associated hemorrhage or mass effect. And underlying chronic small vessel ischemic disease in the cerebral white matter and basal ganglia.   2. No metastatic disease identified.   3. Bulky degenerative anterior cervical spine spurring at C3-C4.   These results will be called to the ordering clinician or representative by the Radiologist Assistant, and  communication documented in the PACS or [employer].  COGNITION: Overall cognitive status: Within functional limits for tasks assessed   SENSATION: WFL  POSTURE: rounded shoulders and forward head  LOWER EXTREMITY ROM:  grossly WFL   LOWER EXTREMITY MMT:  grossly 5/5 all LE   GAIT: Gait pattern: step through pattern, decreased arm swing- Right, decreased arm swing- Left, shuffling, ataxic, narrow BOS, poor foot clearance- Right, and poor foot  clearance- Left Distance walked: in clinic distances Assistive device utilized: None Level of assistance: Complete Independence and Modified independence Comments: has some staggering gait an deviates towards   FUNCTIONAL TESTS:  5 times sit to stand: 18.17s Timed up and go (TUG): 13.12s Berg Balance Scale: 49/56 Functional gait assessment: 21/30  TODAY'S TREATMENT:                                                                                                                              DATE:  05/04/23 NuStep L5 x5mins  Walking on beam  [place]one taps in II bars on airex  Stepping and reaching in II bars  SLS 10s bilaterally  Step ups 6"  Volleyball hits  STS 2x5 from chair    04/27/23 STS with chest press 2x10  Leg press 20# 2x10  Side steps on beam  Tandem on beam 30s each side  NuStep L5 x28mins  Calf raises 2x10 Shoulder 10# 2x10  Leg ext 10# 2x10 HS curls 25# 2x10 Step ups 4" x10 each side holding on to rails  Side steps on airex  Fitter pushes 1 blue and 1 black band 2x10  04/20/23 STS 2x10  Leg ext 10# 2x10 HS curls 25# 2x10 Side steps on beam in II bars Standing 2# marching in II bars Standing hip abd 2#  in II bars  Calf raises 2# in II bars  Bike L3 x54mins    03/16/23- EVAL    PATIENT EDUCATION: Education details: POC and HEP, importance of using a cane for safety reasons and to prevent more falls Person educated: Patient Education method: Explanation Education comprehension: verbalized  understanding  HOME EXERCISE PROGRAM: Access Code: [email or web address] URL: [email or web address] Date: 03/16/2023 Prepared by: Cassie Freer  Exercises - Mini Squat with Counter Support  - 1 x daily - 7 x weekly - 2 sets - 10 reps - Standing March with Counter Support  - 1 x daily - 7 x weekly - 2 sets - 10 reps - Sit to Stand  - 1 x daily - 7 x weekly - 2 sets - 10 reps - Heel Raises with Counter Support  - 1 x daily - 7 x weekly - 2 sets - 10 reps - Standing Tandem Balance with Counter Support  - 1 x daily - 7 x weekly - 15-30 hold  GOALS: Goals reviewed with patient? Yes  SHORT TERM GOALS: Target date: 04/13/23  Patient will be independent with initial HEP. Goal status: MET  2.  Patient will be educated on strategies to decrease risk of falls.  Goal status: MET   LONG TERM GOALS: Target date: 05/11/23  Patient will be independent with advanced/ongoing HEP to improve outcomes and carryover.  Goal status: IN PROGRESS  2.  Patient will be able to ambulate without gait deviations and staggering.  Baseline: loses balance and deviates to the L  Goal status: INITIAL   3.  Patient will demonstrate improved functional LE  strength and decreased fall risk by scoring < 12 sec on 5xSTS. Baseline: 18.17 Goal status: INITIAL.  4.  Patient will demonstrate at least 26/30 on FGA to improve gait stability and reduce risk for falls. Baseline: 21/30 Goal status: INITIAL  ASSESSMENT:  CLINICAL IMPRESSION: Patient was seen today for physical therapy treatment for CVA. We continued to focus on some strengthening and balance in the parallel bars. Still sort of unsteady on his feet, and has difficulty with walking on beam. However, is able to do SLS for 10s today, showing good progress. Difficulty with volleyball hits due to visual impairment in one eye. Continue to work on balance to decrease fall risk, he would like to add more visits for an additional month of PT.   OBJECTIVE  IMPAIRMENTS: Abnormal gait, decreased balance, decreased coordination, and pain.   REHAB POTENTIAL: Good  CLINICAL DECISION MAKING: Stable/uncomplicated  EVALUATION COMPLEXITY: Low  PLAN:  PT FREQUENCY: 1x/week  PT DURATION: 8 weeks  PLANNED INTERVENTIONS: Therapeutic exercises, Therapeutic activity, Neuromuscular re-education, Balance training, Gait training, Patient/Family education, Self Care, Joint mobilization, Stair training, Cryotherapy, Moist heat, and Manual therapy  PLAN FOR NEXT SESSION: gym activities to work on balance and strength    [employer], PT 05/03/2023, 5:22 PM

## 2023-05-04 ENCOUNTER — Ambulatory Visit: Payer: Medicare HMO | Attending: Neurology

## 2023-05-04 ENCOUNTER — Encounter: Payer: Self-pay | Admitting: Occupational Therapy

## 2023-05-04 ENCOUNTER — Ambulatory Visit: Payer: Medicare HMO | Admitting: Occupational Therapy

## 2023-05-04 DIAGNOSIS — R41844 Frontal lobe and executive function deficit: Secondary | ICD-10-CM | POA: Diagnosis not present

## 2023-05-04 DIAGNOSIS — R2681 Unsteadiness on feet: Secondary | ICD-10-CM | POA: Insufficient documentation

## 2023-05-04 DIAGNOSIS — R278 Other lack of coordination: Secondary | ICD-10-CM | POA: Diagnosis not present

## 2023-05-04 DIAGNOSIS — R2689 Other abnormalities of gait and mobility: Secondary | ICD-10-CM

## 2023-05-04 DIAGNOSIS — R41840 Attention and concentration deficit: Secondary | ICD-10-CM | POA: Insufficient documentation

## 2023-05-04 DIAGNOSIS — M6281 Muscle weakness (generalized): Secondary | ICD-10-CM | POA: Insufficient documentation

## 2023-05-04 DIAGNOSIS — I639 Cerebral infarction, unspecified: Secondary | ICD-10-CM | POA: Diagnosis not present

## 2023-05-04 NOTE — Patient Instructions (Signed)
  Can perform exercises for both arms  Resisted Horizontal Abduction: Bilateral   Sit or stand, tubing in both hands, arms out in front. Keeping arms straight, pinch shoulder blades together and stretch arms out. Repeat _15__ times per set. Do _1_ sessions per day, every other day.   Elbow Flexion: Resisted   With tubing held in ___left___ hand(s) and other end secured under foot, curl arm up as far as possible. Repeat _10___ times per set. Do _1-2___ sessions per day, every other day.    Elbow Extension: Resisted   Sit in chair with resistive band secured at armrest (or hold with other hand) and __left_____ elbow bent. Straighten elbow. Repeat _10___ times per set.  Do _1-2___ sessions per day, every other day.   Copyright  VHI. All rights reserved.       SHOULDER: Extension (Band)    Start with arm slightly forward. Holding band, pull backward, past hip, keeping elbow straight. Do not swing arm. Hold _5__ seconds. Use ___red _____ band. _15__ reps per set, __1_ sets per day, __5_ days per week  Copyright  VHI. All rights reserved.  Shoulder Flexion: Sitting (Single Arm)    Anchor tubing under foot. Same side hand thumb up, raise arm forward for left arm Repeat _15_ times per set. Repeat with other arm. Do _1_ sets per session. Do _5_ sessions per week.   http://tub.exer.us/281   Copyright  VHI. All rights reserved.

## 2023-05-04 NOTE — Therapy (Signed)
OUTPATIENT OCCUPATIONAL THERAPY NEURO EVALUATION  Patient Name: Timothy Conway MRN: [id number] DOB:07/30/1956, 67 y.o., male Today's Date: 05/04/2023  PCP: Dr. Docia Chuck REFERRING PROVIDER: Dr. Pearlean Brownie  END OF SESSION:  OT End of Session - 05/04/23 1018     Visit Number 3    Number of Visits 9    Date for OT Re-Evaluation 05/17/23    Authorization Type [employer]    Authorization Time Period 9 weeks    Authorization - Visit Number 3    Progress Note Due on Visit 10    OT Start Time 1019    OT Stop Time 1100    OT Time Calculation (min) 41 min    Activity Tolerance Patient tolerated treatment well    Behavior During Therapy WFL for tasks assessed/performed               Past Medical History:  Diagnosis Date   Adenocarcinoma, lung, left (HCC) 07/05/2017   Age-related osteoporosis without current pathological fracture 12/12/2021   Anxiety    COPD (chronic obstructive pulmonary disease) (HCC)    Depression    Dry eye syndrome    Dysrhythmia    hx of PAC's   Effusion of elbow joint, right 06/29/2017   Gastric ulcer with perforation but without obstruction (HCC) 04/15/2016   GERD (gastroesophageal reflux disease)    Gout    Hyperlipidemia    Hypertension    Hypothyroidism    OSA (obstructive sleep apnea) 02/18/2023   Papillary thyroid carcinoma s/p total thyroidectomy & CLND 08/27/2017 08/24/2017   Postsurgical hypothyroidism 10/01/2017   Thyroid cancer (HCC)    2018   Past Surgical History:  Procedure Laterality Date   ABDOMINAL SURGERY     hemmoraging ulcer   BRONCHIAL BIOPSY  06/25/2020   Procedure: BRONCHIAL BIOPSIES;  Surgeon: [name], MD;  Location: [facility] ENDOSCOPY;  Service: Pulmonary;;   BRONCHIAL BRUSHINGS  06/25/2020   Procedure: BRONCHIAL BRUSHINGS;  Surgeon: [name], MD;  Location: [facility] ENDOSCOPY;  Service: Pulmonary;;   BRONCHIAL NEEDLE ASPIRATION BIOPSY  06/25/2020   Procedure: BRONCHIAL NEEDLE ASPIRATION BIOPSIES;  Surgeon: [name],  MD;  Location: [facility] ENDOSCOPY;  Service: Pulmonary;;   BRONCHIAL WASHINGS  06/25/2020   Procedure: BRONCHIAL WASHINGS;  Surgeon: [name], MD;  Location: [facility] ENDOSCOPY;  Service: Pulmonary;;   CATARACT EXTRACTION Right    COLONOSCOPY     EYE SURGERY Right    x 4  between ages 20-13   FIDUCIAL MARKER PLACEMENT  06/25/2020   Procedure: FIDUCIAL MARKER PLACEMENT;  Surgeon: [name], MD;  Location: [facility] ENDOSCOPY;  Service: Pulmonary;;   THYROIDECTOMY N/A 08/27/2017   Procedure: TOTAL THYROIDECTOMY WITH LIMITED LYMPH NODE DISSECTION;  Surgeon: [name], MD;  Location: [facility] ORS;  Service: General;  Laterality: N/A;   UPPER GI ENDOSCOPY     VIDEO BRONCHOSCOPY WITH ENDOBRONCHIAL NAVIGATION N/A 06/25/2020   Procedure: VIDEO BRONCHOSCOPY WITH ENDOBRONCHIAL NAVIGATION;  Surgeon: [name], MD;  Location: [facility] ENDOSCOPY;  Service: Pulmonary;  Laterality: N/A;   Patient Active Problem List   Diagnosis Date Noted   Sleep apnea-like behavior 04/08/2023   Excessive daytime sleepiness 02/18/2023   History of gastric ulcer 02/18/2023   HTN (hypertension), benign 02/18/2023   Hx of papillary thyroid carcinoma 02/18/2023   Hypercholesteremia 02/18/2023   Low back pain 02/18/2023   Major depression, single episode 02/18/2023   Morbid obesity (HCC) 02/18/2023   Nodule of left lung 02/18/2023   OSA (obstructive sleep apnea)  02/18/2023   Non-restorative sleep 02/18/2023   Penetrating ulcer of aorta (HCC) 02/18/2023   Protrusion of intervertebral disc of thoracic region 02/18/2023   Seborrheic dermatitis 02/18/2023   Vitamin D deficiency 02/18/2023   Late effect of cerebrovascular accident (CVA) 01/05/2023   Dyslipidemia 01/05/2023   History of smoking 01/05/2023   Age-related osteoporosis without current pathological fracture 12/12/2021   Postsurgical hypothyroidism 10/01/2017   Papillary thyroid carcinoma s/p total thyroidectomy & CLND 08/27/2017 08/24/2017   Adenocarcinoma, lung, left  (HCC) 07/05/2017   COPD (chronic obstructive pulmonary disease) (HCC) 07/05/2017   Effusion of elbow joint, right 06/29/2017   Gastric ulcer with perforation but without obstruction (HCC) 04/15/2016    ONSET DATE: 02/18/23- referral date  REFERRING DIAG: I63.9 (ICD-10-CM) - Cerebellar stroke (HCC)  THERAPY DIAG:  Unsteadiness on feet  Other abnormalities of gait and mobility  Other lack of coordination  Attention and concentration deficit  Frontal lobe and executive function deficit  Muscle weakness (generalized)  Rationale for Evaluation and Treatment: Rehabilitation  SUBJECTIVE:   SUBJECTIVE STATEMENT: Pt reports he is doing better Pt accompanied by: self  PERTINENT HISTORY: He underwent MRI scan of the brain on 11/07/2022 which showed a 1 cm right cerebellar cortical and white matter acute infarct as well as remote age bilateral basal ganglia and corona radiata lacunar infarcts.  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 Pt reports rib pain and pelvis pain from fall approx. 2 months ago.Pt states he did not have imaging   PRECAUTIONS: Fall  WEIGHT BEARING RESTRICTIONS: No  PAIN: no reports of pain  FALLS: Has patient fallen in last 6 months? Yes. Number of falls 3  LIVING ENVIRONMENT: Lives with: lives alone Lives in: House/apartment Stairs: No Has following equipment at home: Single point cane and Grab bars  PLOF: Independent  PATIENT GOALS: increase safety and I with ADLS  OBJECTIVE:   HAND DOMINANCE: Right  ADLs: Overall ADLs: increased time required Transfers/ambulation related to ADLs:fall risk Eating: mod I Grooming: mod I UB Dressing: mod I LB Dressing: increased time required Toileting:  difficulty getting up and down from commode Bathing: stands in shower, does not have seat Tub Shower transfers: performs mod I however would benefit from tub transfer bench Equipment: Grab bars  IADLs: Shopping: pt does his own shopping Light housekeeping: mod I Meal Prep: uses microwave to heat items Community mobility: mod I with hx of falls Medication management: Pt handles his own meds, pt reports sometimes increased difficulty remembering to take Financial management: Pt handles, online whenever possible Handwriting: 100% legible  MOBILITY STATUS: Hx of falls and pt is unsteady without assistive device  Standing functional reach: 10" bilaterally    ACTIVITY TOLERANCE: Activity tolerance: Pt reports that he can stand for activity for approximately 45 mins    UPPER EXTREMITY ROM:  WFLS  (Blank rows = not tested)  UPPER EXTREMITY MMT:     MMT Right eval Left eval  Shoulder flexion 5/5 5/5  Shoulder abduction    Shoulder adduction    Shoulder extension    Shoulder internal rotation    Shoulder external rotation    Middle trapezius    Lower trapezius    Elbow flexion 5/5 4+/5  Elbow extension 4+/5 4+/5  Wrist flexion  Wrist extension    Wrist ulnar deviation    Wrist radial deviation    Wrist pronation    Wrist supination    (Blank rows = not tested)  HAND FUNCTION: Grip strength: Right: 65 lbs; Left: 55 lbs  COORDINATION: 9 hole peg test  SENSATION: Not tested   COGNITION: Overall cognitive status: decreased processing speed, pt reports ST memory deficits, however he was able to recall 3/3 words following short delay, and he was able to spell WORLD backwards  VISION: Subjective report: blind in R eye  Visual history:  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  VISION ASSESSMENT: Not tested- Pt reports he thinks he is due to see eye doctor and has mild blurriness, will further assess in a functional context     OBSERVATIONS: Pt is unsteady with  ambulation   TODAY'S TREATMENT:                                                                                                                              DATE: 05/04/23- Pt was instructed in red theraband HEP, 15 reps each, min v.c  reviewed putty HEP 20 reps each Copying small peg design with LUE, min difficulty due to coordination, min v.c 04/20/23- Pt was educated in coordination HEP for bilateral UE's min difficulty with RUE, mod difficulty/ v.c for LUE  PATIENT EDUCATION: Education details:  red theraband HEP see pt instructions, reviewed putty HEP Education method: Explanation, Demonstration, Verbal cues, and Handouts Education comprehension: verbalized understanding, returned demonstration, and verbal cues required   HOME EXERCISE PROGRAM: 04/20/23- coordination and putty HEP 05/04/23- red theraband  exercises    GOALS: Goals reviewed with patient? Yes  SHORT TERM GOALS: Target date: 04/13/23  I with initial HEP.  Goal status:  ongoing, issued 04/20/23  2.  I with adapted strategies, AE and DME to maximize safety and I with ADLs. ( Consider tub bench and  3 in 1)  Goal status: ongoing, information issued regarding tub transfer bench - pt has ordered 05/04/23  3.  Pt will verbalize understanding of compensatory strategies for short term memory/ cognitive deficits.  Goal status: ongoing, 05/04/23  4.  Pt will increase LUE grip strength by 5 lbs for increased functional use. Baseline: RUE 65 lbs, LUE- 55 lbs Goal status: met -70 lbs for LUE 05/04/23    LONG TERM GOALS: Target date: 05/17/23  I with updated HEP  Goal status: INITIAL  2.  Pt will demonstrate improved fine motor coordination for ADLs as evidenced by decreasing 9 hole peg test score by 3 secs bilaterally. Baseline:RUE 52.25 secs, LUE 55.06 secs Goal status: INITIAL  3.  Pt will perform transitional movements for ADLs without LOB using UE support. prn  Goal status: INITIAL    ASSESSMENT:  CLINICAL  IMPRESSION: Pt is progressing towards goals. He demonstrates improving UE strength and coordination.PERFORMANCE DEFICITS: in functional skills including ADLs, IADLs, coordination, dexterity, ROM, strength, flexibility, Fine motor control,  mobility, balance, endurance, decreased knowledge of precautions, decreased knowledge of use of DME, vision, and UE functional use, cognitive skills including memory, problem solving, safety awareness, and thought, and psychosocial skills including coping strategies, environmental adaptation, habits, interpersonal interactions, and routines and behaviors.   IMPAIRMENTS: are limiting patient from ADLs, IADLs, rest and sleep, education, play, leisure, and social participation.   CO-MORBIDITIES: may have co-morbidities  that affects occupational performance. Patient will benefit from skilled OT to address above impairments and improve overall function.  MODIFICATION OR ASSISTANCE TO COMPLETE EVALUATION: No modification of tasks or assist necessary to complete an evaluation.  OT OCCUPATIONAL PROFILE AND HISTORY: Detailed assessment: Review of records and additional review of physical, cognitive, psychosocial history related to current functional performance.  CLINICAL DECISION MAKING: LOW - limited treatment options, no task modification necessary  REHAB POTENTIAL: Good  EVALUATION COMPLEXITY: Low    PLAN:  OT FREQUENCY: 1x/week plus eval  OT DURATION: 8 weeks  PLANNED INTERVENTIONS: self care/ADL training, therapeutic exercise, therapeutic activity, neuromuscular re-education, manual therapy, passive range of motion, balance training, functional mobility training, paraffin, moist heat, cryotherapy, contrast bath, patient/family education, cognitive remediation/compensation, visual/perceptual remediation/compensation, energy conservation, coping strategies training, DME and/or AE instructions, and Re-evaluation  RECOMMENDED OTHER SERVICES: PT  CONSULTED AND  AGREED WITH PLAN OF CARE: Patient  PLAN FOR NEXT SESSION:review red theraband HEP, gripper,  coordination/ grip strength,  safety for ADLS  Jakorey Mcconathy, OT 05/04/2023, 10:18 AM

## 2023-05-11 ENCOUNTER — Ambulatory Visit: Payer: Medicare HMO

## 2023-05-11 ENCOUNTER — Ambulatory Visit: Payer: Medicare HMO | Admitting: Occupational Therapy

## 2023-05-13 DIAGNOSIS — J449 Chronic obstructive pulmonary disease, unspecified: Secondary | ICD-10-CM | POA: Diagnosis not present

## 2023-05-13 DIAGNOSIS — Z9181 History of falling: Secondary | ICD-10-CM | POA: Diagnosis not present

## 2023-05-13 DIAGNOSIS — Z79899 Other long term (current) drug therapy: Secondary | ICD-10-CM | POA: Diagnosis not present

## 2023-05-13 DIAGNOSIS — Z125 Encounter for screening for malignant neoplasm of prostate: Secondary | ICD-10-CM | POA: Diagnosis not present

## 2023-05-13 DIAGNOSIS — Z0001 Encounter for general adult medical examination with abnormal findings: Secondary | ICD-10-CM | POA: Diagnosis not present

## 2023-05-13 DIAGNOSIS — E7800 Pure hypercholesterolemia, unspecified: Secondary | ICD-10-CM | POA: Diagnosis not present

## 2023-05-13 DIAGNOSIS — K7460 Unspecified cirrhosis of liver: Secondary | ICD-10-CM | POA: Diagnosis not present

## 2023-05-14 ENCOUNTER — Other Ambulatory Visit: Payer: Self-pay | Admitting: Family Medicine

## 2023-05-14 DIAGNOSIS — K7460 Unspecified cirrhosis of liver: Secondary | ICD-10-CM

## 2023-05-19 ENCOUNTER — Ambulatory Visit: Payer: Medicare HMO | Admitting: Occupational Therapy

## 2023-05-19 ENCOUNTER — Ambulatory Visit: Payer: Medicare HMO

## 2023-05-24 ENCOUNTER — Ambulatory Visit (INDEPENDENT_AMBULATORY_CARE_PROVIDER_SITE_OTHER): Payer: Medicare HMO

## 2023-05-24 DIAGNOSIS — I639 Cerebral infarction, unspecified: Secondary | ICD-10-CM | POA: Diagnosis not present

## 2023-05-24 LAB — CUP PACEART REMOTE DEVICE CHECK
Date Time Interrogation Session: 20240623230507
Implantable Pulse Generator Implant Date: 20240514

## 2023-05-26 ENCOUNTER — Ambulatory Visit: Payer: Medicare HMO

## 2023-05-26 ENCOUNTER — Ambulatory Visit: Payer: Medicare HMO | Admitting: Occupational Therapy

## 2023-05-30 DIAGNOSIS — U071 COVID-19: Secondary | ICD-10-CM | POA: Diagnosis not present

## 2023-05-30 DIAGNOSIS — E890 Postprocedural hypothyroidism: Secondary | ICD-10-CM | POA: Diagnosis not present

## 2023-05-30 DIAGNOSIS — R4182 Altered mental status, unspecified: Secondary | ICD-10-CM | POA: Diagnosis not present

## 2023-05-30 DIAGNOSIS — G9341 Metabolic encephalopathy: Secondary | ICD-10-CM | POA: Diagnosis not present

## 2023-05-30 DIAGNOSIS — G459 Transient cerebral ischemic attack, unspecified: Secondary | ICD-10-CM | POA: Diagnosis not present

## 2023-05-30 DIAGNOSIS — F419 Anxiety disorder, unspecified: Secondary | ICD-10-CM | POA: Diagnosis not present

## 2023-05-30 DIAGNOSIS — C73 Malignant neoplasm of thyroid gland: Secondary | ICD-10-CM | POA: Diagnosis not present

## 2023-05-30 DIAGNOSIS — I10 Essential (primary) hypertension: Secondary | ICD-10-CM | POA: Diagnosis not present

## 2023-05-30 DIAGNOSIS — Z20822 Contact with and (suspected) exposure to covid-19: Secondary | ICD-10-CM | POA: Diagnosis not present

## 2023-05-30 DIAGNOSIS — N179 Acute kidney failure, unspecified: Secondary | ICD-10-CM | POA: Diagnosis not present

## 2023-05-30 DIAGNOSIS — I452 Bifascicular block: Secondary | ICD-10-CM | POA: Diagnosis not present

## 2023-05-30 DIAGNOSIS — E785 Hyperlipidemia, unspecified: Secondary | ICD-10-CM | POA: Diagnosis not present

## 2023-05-30 DIAGNOSIS — F10139 Alcohol abuse with withdrawal, unspecified: Secondary | ICD-10-CM | POA: Diagnosis not present

## 2023-05-30 DIAGNOSIS — R062 Wheezing: Secondary | ICD-10-CM | POA: Diagnosis not present

## 2023-05-30 DIAGNOSIS — F10251 Alcohol dependence with alcohol-induced psychotic disorder with hallucinations: Secondary | ICD-10-CM | POA: Diagnosis not present

## 2023-05-30 DIAGNOSIS — Z743 Need for continuous supervision: Secondary | ICD-10-CM | POA: Diagnosis not present

## 2023-05-30 DIAGNOSIS — J439 Emphysema, unspecified: Secondary | ICD-10-CM | POA: Diagnosis not present

## 2023-05-30 DIAGNOSIS — R509 Fever, unspecified: Secondary | ICD-10-CM | POA: Diagnosis not present

## 2023-05-30 DIAGNOSIS — J441 Chronic obstructive pulmonary disease with (acute) exacerbation: Secondary | ICD-10-CM | POA: Diagnosis not present

## 2023-05-30 DIAGNOSIS — F10929 Alcohol use, unspecified with intoxication, unspecified: Secondary | ICD-10-CM | POA: Diagnosis not present

## 2023-05-30 DIAGNOSIS — F4000 Agoraphobia, unspecified: Secondary | ICD-10-CM | POA: Diagnosis not present

## 2023-05-30 DIAGNOSIS — I6782 Cerebral ischemia: Secondary | ICD-10-CM | POA: Diagnosis not present

## 2023-05-30 DIAGNOSIS — F32A Depression, unspecified: Secondary | ICD-10-CM | POA: Diagnosis not present

## 2023-05-30 DIAGNOSIS — R531 Weakness: Secondary | ICD-10-CM | POA: Diagnosis not present

## 2023-05-30 DIAGNOSIS — I4510 Unspecified right bundle-branch block: Secondary | ICD-10-CM | POA: Diagnosis not present

## 2023-05-30 DIAGNOSIS — F13239 Sedative, hypnotic or anxiolytic dependence with withdrawal, unspecified: Secondary | ICD-10-CM | POA: Diagnosis not present

## 2023-05-30 DIAGNOSIS — F1020 Alcohol dependence, uncomplicated: Secondary | ICD-10-CM | POA: Diagnosis not present

## 2023-05-30 DIAGNOSIS — I639 Cerebral infarction, unspecified: Secondary | ICD-10-CM | POA: Diagnosis not present

## 2023-05-30 DIAGNOSIS — E8729 Other acidosis: Secondary | ICD-10-CM | POA: Diagnosis not present

## 2023-06-01 ENCOUNTER — Ambulatory Visit: Payer: Medicare HMO

## 2023-06-01 ENCOUNTER — Ambulatory Visit: Payer: Medicare HMO | Admitting: Occupational Therapy

## 2023-06-01 DIAGNOSIS — Z7409 Other reduced mobility: Secondary | ICD-10-CM | POA: Insufficient documentation

## 2023-06-01 DIAGNOSIS — R4182 Altered mental status, unspecified: Secondary | ICD-10-CM | POA: Diagnosis not present

## 2023-06-01 DIAGNOSIS — I452 Bifascicular block: Secondary | ICD-10-CM | POA: Diagnosis not present

## 2023-06-01 DIAGNOSIS — I4510 Unspecified right bundle-branch block: Secondary | ICD-10-CM | POA: Diagnosis not present

## 2023-06-02 DIAGNOSIS — F1020 Alcohol dependence, uncomplicated: Secondary | ICD-10-CM | POA: Diagnosis not present

## 2023-06-02 DIAGNOSIS — R4182 Altered mental status, unspecified: Secondary | ICD-10-CM | POA: Diagnosis not present

## 2023-06-03 DIAGNOSIS — R4182 Altered mental status, unspecified: Secondary | ICD-10-CM | POA: Diagnosis not present

## 2023-06-04 ENCOUNTER — Telehealth: Payer: Self-pay

## 2023-06-04 DIAGNOSIS — C73 Malignant neoplasm of thyroid gland: Secondary | ICD-10-CM

## 2023-06-04 DIAGNOSIS — F1020 Alcohol dependence, uncomplicated: Secondary | ICD-10-CM | POA: Diagnosis not present

## 2023-06-04 DIAGNOSIS — R4182 Altered mental status, unspecified: Secondary | ICD-10-CM | POA: Diagnosis not present

## 2023-06-04 MED ORDER — LEVOTHYROXINE SODIUM 125 MCG PO TABS
125.0000 ug | ORAL_TABLET | Freq: Every day | ORAL | 0 refills | Status: DC
Start: 2023-06-04 — End: 2023-06-29

## 2023-06-04 NOTE — Telephone Encounter (Signed)
Patient called and states he is currently in the Hospital and so he can not make his appt on Monday. I sent in a 30 day supply of his Levothyroxine. Please cancel appt.

## 2023-06-05 DIAGNOSIS — J441 Chronic obstructive pulmonary disease with (acute) exacerbation: Secondary | ICD-10-CM | POA: Diagnosis not present

## 2023-06-05 DIAGNOSIS — R0602 Shortness of breath: Secondary | ICD-10-CM | POA: Diagnosis not present

## 2023-06-05 DIAGNOSIS — F29 Unspecified psychosis not due to a substance or known physiological condition: Secondary | ICD-10-CM | POA: Diagnosis not present

## 2023-06-05 DIAGNOSIS — R4182 Altered mental status, unspecified: Secondary | ICD-10-CM | POA: Diagnosis not present

## 2023-06-05 DIAGNOSIS — F419 Anxiety disorder, unspecified: Secondary | ICD-10-CM | POA: Insufficient documentation

## 2023-06-05 DIAGNOSIS — G459 Transient cerebral ischemic attack, unspecified: Secondary | ICD-10-CM | POA: Diagnosis not present

## 2023-06-05 DIAGNOSIS — J449 Chronic obstructive pulmonary disease, unspecified: Secondary | ICD-10-CM | POA: Diagnosis not present

## 2023-06-05 DIAGNOSIS — I639 Cerebral infarction, unspecified: Secondary | ICD-10-CM | POA: Diagnosis not present

## 2023-06-05 DIAGNOSIS — C73 Malignant neoplasm of thyroid gland: Secondary | ICD-10-CM | POA: Insufficient documentation

## 2023-06-05 DIAGNOSIS — I6782 Cerebral ischemia: Secondary | ICD-10-CM | POA: Insufficient documentation

## 2023-06-05 DIAGNOSIS — F1021 Alcohol dependence, in remission: Secondary | ICD-10-CM | POA: Diagnosis not present

## 2023-06-05 DIAGNOSIS — R2681 Unsteadiness on feet: Secondary | ICD-10-CM | POA: Diagnosis not present

## 2023-06-05 DIAGNOSIS — F13239 Sedative, hypnotic or anxiolytic dependence with withdrawal, unspecified: Secondary | ICD-10-CM | POA: Insufficient documentation

## 2023-06-05 DIAGNOSIS — U071 COVID-19: Secondary | ICD-10-CM | POA: Diagnosis not present

## 2023-06-05 DIAGNOSIS — Z1152 Encounter for screening for COVID-19: Secondary | ICD-10-CM | POA: Diagnosis not present

## 2023-06-05 DIAGNOSIS — L89156 Pressure-induced deep tissue damage of sacral region: Secondary | ICD-10-CM | POA: Insufficient documentation

## 2023-06-05 DIAGNOSIS — E039 Hypothyroidism, unspecified: Secondary | ICD-10-CM | POA: Insufficient documentation

## 2023-06-05 DIAGNOSIS — Z743 Need for continuous supervision: Secondary | ICD-10-CM | POA: Diagnosis not present

## 2023-06-05 DIAGNOSIS — F4000 Agoraphobia, unspecified: Secondary | ICD-10-CM | POA: Diagnosis not present

## 2023-06-05 DIAGNOSIS — F10251 Alcohol dependence with alcohol-induced psychotic disorder with hallucinations: Secondary | ICD-10-CM | POA: Insufficient documentation

## 2023-06-05 DIAGNOSIS — R062 Wheezing: Secondary | ICD-10-CM | POA: Insufficient documentation

## 2023-06-05 DIAGNOSIS — J310 Chronic rhinitis: Secondary | ICD-10-CM | POA: Diagnosis not present

## 2023-06-05 DIAGNOSIS — K219 Gastro-esophageal reflux disease without esophagitis: Secondary | ICD-10-CM | POA: Diagnosis not present

## 2023-06-05 DIAGNOSIS — R531 Weakness: Secondary | ICD-10-CM | POA: Diagnosis not present

## 2023-06-05 HISTORY — DX: Cerebral infarction, unspecified: I63.9

## 2023-06-05 HISTORY — DX: Malignant neoplasm of thyroid gland: C73

## 2023-06-05 HISTORY — DX: Altered mental status, unspecified: R41.82

## 2023-06-05 HISTORY — DX: Agoraphobia, unspecified: F40.00

## 2023-06-05 HISTORY — DX: Hypothyroidism, unspecified: E03.9

## 2023-06-05 HISTORY — DX: Alcohol dependence with alcohol-induced psychotic disorder with hallucinations: F10.251

## 2023-06-05 HISTORY — DX: Wheezing: R06.2

## 2023-06-05 HISTORY — DX: Cerebral ischemia: I67.82

## 2023-06-07 ENCOUNTER — Ambulatory Visit: Payer: Medicare HMO | Admitting: Internal Medicine

## 2023-06-07 DIAGNOSIS — R531 Weakness: Secondary | ICD-10-CM | POA: Diagnosis not present

## 2023-06-07 DIAGNOSIS — R2681 Unsteadiness on feet: Secondary | ICD-10-CM | POA: Insufficient documentation

## 2023-06-07 DIAGNOSIS — M6250 Muscle wasting and atrophy, not elsewhere classified, unspecified site: Secondary | ICD-10-CM | POA: Insufficient documentation

## 2023-06-07 DIAGNOSIS — R0602 Shortness of breath: Secondary | ICD-10-CM | POA: Diagnosis not present

## 2023-06-07 DIAGNOSIS — R262 Difficulty in walking, not elsewhere classified: Secondary | ICD-10-CM | POA: Insufficient documentation

## 2023-06-07 DIAGNOSIS — R4182 Altered mental status, unspecified: Secondary | ICD-10-CM | POA: Diagnosis not present

## 2023-06-07 HISTORY — DX: Muscle wasting and atrophy, not elsewhere classified, unspecified site: M62.50

## 2023-06-08 ENCOUNTER — Ambulatory Visit: Payer: Medicare HMO | Admitting: Occupational Therapy

## 2023-06-08 ENCOUNTER — Ambulatory Visit: Payer: Medicare HMO

## 2023-06-08 DIAGNOSIS — F29 Unspecified psychosis not due to a substance or known physiological condition: Secondary | ICD-10-CM | POA: Diagnosis not present

## 2023-06-08 DIAGNOSIS — K219 Gastro-esophageal reflux disease without esophagitis: Secondary | ICD-10-CM | POA: Diagnosis not present

## 2023-06-08 DIAGNOSIS — F419 Anxiety disorder, unspecified: Secondary | ICD-10-CM | POA: Diagnosis not present

## 2023-06-08 DIAGNOSIS — E039 Hypothyroidism, unspecified: Secondary | ICD-10-CM | POA: Diagnosis not present

## 2023-06-09 DIAGNOSIS — R2681 Unsteadiness on feet: Secondary | ICD-10-CM | POA: Diagnosis not present

## 2023-06-09 DIAGNOSIS — R531 Weakness: Secondary | ICD-10-CM | POA: Diagnosis not present

## 2023-06-09 DIAGNOSIS — R0602 Shortness of breath: Secondary | ICD-10-CM | POA: Diagnosis not present

## 2023-06-09 DIAGNOSIS — R4182 Altered mental status, unspecified: Secondary | ICD-10-CM | POA: Diagnosis not present

## 2023-06-11 DIAGNOSIS — F1021 Alcohol dependence, in remission: Secondary | ICD-10-CM | POA: Diagnosis not present

## 2023-06-11 DIAGNOSIS — R4182 Altered mental status, unspecified: Secondary | ICD-10-CM | POA: Diagnosis not present

## 2023-06-11 DIAGNOSIS — I639 Cerebral infarction, unspecified: Secondary | ICD-10-CM | POA: Diagnosis not present

## 2023-06-11 DIAGNOSIS — J449 Chronic obstructive pulmonary disease, unspecified: Secondary | ICD-10-CM | POA: Diagnosis not present

## 2023-06-11 NOTE — Progress Notes (Signed)
[facility] Summary Report / Loop Recorder 

## 2023-06-14 DIAGNOSIS — R0602 Shortness of breath: Secondary | ICD-10-CM | POA: Diagnosis not present

## 2023-06-14 DIAGNOSIS — R4182 Altered mental status, unspecified: Secondary | ICD-10-CM | POA: Diagnosis not present

## 2023-06-14 DIAGNOSIS — R531 Weakness: Secondary | ICD-10-CM | POA: Diagnosis not present

## 2023-06-14 DIAGNOSIS — R2681 Unsteadiness on feet: Secondary | ICD-10-CM | POA: Diagnosis not present

## 2023-06-16 DIAGNOSIS — R4182 Altered mental status, unspecified: Secondary | ICD-10-CM | POA: Diagnosis not present

## 2023-06-16 DIAGNOSIS — R531 Weakness: Secondary | ICD-10-CM | POA: Diagnosis not present

## 2023-06-16 DIAGNOSIS — J449 Chronic obstructive pulmonary disease, unspecified: Secondary | ICD-10-CM | POA: Diagnosis not present

## 2023-06-16 DIAGNOSIS — U071 COVID-19: Secondary | ICD-10-CM | POA: Insufficient documentation

## 2023-06-16 DIAGNOSIS — R0602 Shortness of breath: Secondary | ICD-10-CM | POA: Diagnosis not present

## 2023-06-16 DIAGNOSIS — R2681 Unsteadiness on feet: Secondary | ICD-10-CM | POA: Diagnosis not present

## 2023-06-16 DIAGNOSIS — J310 Chronic rhinitis: Secondary | ICD-10-CM | POA: Diagnosis not present

## 2023-06-18 DIAGNOSIS — J441 Chronic obstructive pulmonary disease with (acute) exacerbation: Secondary | ICD-10-CM | POA: Diagnosis not present

## 2023-06-18 DIAGNOSIS — F10251 Alcohol dependence with alcohol-induced psychotic disorder with hallucinations: Secondary | ICD-10-CM | POA: Diagnosis not present

## 2023-06-18 DIAGNOSIS — F419 Anxiety disorder, unspecified: Secondary | ICD-10-CM | POA: Diagnosis not present

## 2023-06-18 DIAGNOSIS — M6250 Muscle wasting and atrophy, not elsewhere classified, unspecified site: Secondary | ICD-10-CM | POA: Diagnosis not present

## 2023-06-18 DIAGNOSIS — I639 Cerebral infarction, unspecified: Secondary | ICD-10-CM | POA: Diagnosis not present

## 2023-06-18 DIAGNOSIS — R4182 Altered mental status, unspecified: Secondary | ICD-10-CM | POA: Diagnosis not present

## 2023-06-18 DIAGNOSIS — U071 COVID-19: Secondary | ICD-10-CM | POA: Diagnosis not present

## 2023-06-18 DIAGNOSIS — F13239 Sedative, hypnotic or anxiolytic dependence with withdrawal, unspecified: Secondary | ICD-10-CM | POA: Diagnosis not present

## 2023-06-18 DIAGNOSIS — R2681 Unsteadiness on feet: Secondary | ICD-10-CM | POA: Diagnosis not present

## 2023-06-21 DIAGNOSIS — I639 Cerebral infarction, unspecified: Secondary | ICD-10-CM | POA: Diagnosis not present

## 2023-06-21 DIAGNOSIS — J441 Chronic obstructive pulmonary disease with (acute) exacerbation: Secondary | ICD-10-CM | POA: Diagnosis not present

## 2023-06-21 DIAGNOSIS — F10251 Alcohol dependence with alcohol-induced psychotic disorder with hallucinations: Secondary | ICD-10-CM | POA: Diagnosis not present

## 2023-06-21 DIAGNOSIS — F13239 Sedative, hypnotic or anxiolytic dependence with withdrawal, unspecified: Secondary | ICD-10-CM | POA: Diagnosis not present

## 2023-06-21 DIAGNOSIS — M6250 Muscle wasting and atrophy, not elsewhere classified, unspecified site: Secondary | ICD-10-CM | POA: Diagnosis not present

## 2023-06-21 DIAGNOSIS — F419 Anxiety disorder, unspecified: Secondary | ICD-10-CM | POA: Diagnosis not present

## 2023-06-21 DIAGNOSIS — R2681 Unsteadiness on feet: Secondary | ICD-10-CM | POA: Diagnosis not present

## 2023-06-21 DIAGNOSIS — R4182 Altered mental status, unspecified: Secondary | ICD-10-CM | POA: Diagnosis not present

## 2023-06-21 DIAGNOSIS — R0602 Shortness of breath: Secondary | ICD-10-CM | POA: Diagnosis not present

## 2023-06-21 DIAGNOSIS — U071 COVID-19: Secondary | ICD-10-CM | POA: Diagnosis not present

## 2023-06-21 DIAGNOSIS — R531 Weakness: Secondary | ICD-10-CM | POA: Diagnosis not present

## 2023-06-22 DIAGNOSIS — I639 Cerebral infarction, unspecified: Secondary | ICD-10-CM | POA: Diagnosis not present

## 2023-06-22 DIAGNOSIS — R2681 Unsteadiness on feet: Secondary | ICD-10-CM | POA: Diagnosis not present

## 2023-06-22 DIAGNOSIS — F13239 Sedative, hypnotic or anxiolytic dependence with withdrawal, unspecified: Secondary | ICD-10-CM | POA: Diagnosis not present

## 2023-06-22 DIAGNOSIS — U071 COVID-19: Secondary | ICD-10-CM | POA: Diagnosis not present

## 2023-06-22 DIAGNOSIS — F10251 Alcohol dependence with alcohol-induced psychotic disorder with hallucinations: Secondary | ICD-10-CM | POA: Diagnosis not present

## 2023-06-22 DIAGNOSIS — J441 Chronic obstructive pulmonary disease with (acute) exacerbation: Secondary | ICD-10-CM | POA: Diagnosis not present

## 2023-06-22 DIAGNOSIS — F419 Anxiety disorder, unspecified: Secondary | ICD-10-CM | POA: Diagnosis not present

## 2023-06-22 DIAGNOSIS — R4182 Altered mental status, unspecified: Secondary | ICD-10-CM | POA: Diagnosis not present

## 2023-06-22 DIAGNOSIS — M6250 Muscle wasting and atrophy, not elsewhere classified, unspecified site: Secondary | ICD-10-CM | POA: Diagnosis not present

## 2023-06-23 DIAGNOSIS — R531 Weakness: Secondary | ICD-10-CM | POA: Diagnosis not present

## 2023-06-23 DIAGNOSIS — R0602 Shortness of breath: Secondary | ICD-10-CM | POA: Diagnosis not present

## 2023-06-23 DIAGNOSIS — M6250 Muscle wasting and atrophy, not elsewhere classified, unspecified site: Secondary | ICD-10-CM | POA: Diagnosis not present

## 2023-06-23 DIAGNOSIS — U071 COVID-19: Secondary | ICD-10-CM | POA: Diagnosis not present

## 2023-06-23 DIAGNOSIS — J441 Chronic obstructive pulmonary disease with (acute) exacerbation: Secondary | ICD-10-CM | POA: Diagnosis not present

## 2023-06-23 DIAGNOSIS — F419 Anxiety disorder, unspecified: Secondary | ICD-10-CM | POA: Diagnosis not present

## 2023-06-23 DIAGNOSIS — J310 Chronic rhinitis: Secondary | ICD-10-CM | POA: Diagnosis not present

## 2023-06-23 DIAGNOSIS — R2681 Unsteadiness on feet: Secondary | ICD-10-CM | POA: Diagnosis not present

## 2023-06-23 DIAGNOSIS — F13239 Sedative, hypnotic or anxiolytic dependence with withdrawal, unspecified: Secondary | ICD-10-CM | POA: Diagnosis not present

## 2023-06-23 DIAGNOSIS — I639 Cerebral infarction, unspecified: Secondary | ICD-10-CM | POA: Diagnosis not present

## 2023-06-23 DIAGNOSIS — R4182 Altered mental status, unspecified: Secondary | ICD-10-CM | POA: Diagnosis not present

## 2023-06-23 DIAGNOSIS — F10251 Alcohol dependence with alcohol-induced psychotic disorder with hallucinations: Secondary | ICD-10-CM | POA: Diagnosis not present

## 2023-06-24 ENCOUNTER — Other Ambulatory Visit: Payer: Self-pay | Admitting: Internal Medicine

## 2023-06-24 ENCOUNTER — Ambulatory Visit: Payer: Medicare HMO | Admitting: Adult Health

## 2023-06-24 DIAGNOSIS — C73 Malignant neoplasm of thyroid gland: Secondary | ICD-10-CM

## 2023-06-28 ENCOUNTER — Ambulatory Visit (INDEPENDENT_AMBULATORY_CARE_PROVIDER_SITE_OTHER): Payer: Medicare HMO

## 2023-06-28 DIAGNOSIS — I639 Cerebral infarction, unspecified: Secondary | ICD-10-CM

## 2023-06-30 ENCOUNTER — Telehealth: Payer: Self-pay | Admitting: Cardiology

## 2023-06-30 NOTE — Telephone Encounter (Signed)
Pt is requesting a callback regarding his device reading/checks. Please advise

## 2023-07-02 DIAGNOSIS — K7460 Unspecified cirrhosis of liver: Secondary | ICD-10-CM | POA: Diagnosis not present

## 2023-07-02 DIAGNOSIS — F1021 Alcohol dependence, in remission: Secondary | ICD-10-CM | POA: Diagnosis not present

## 2023-07-02 DIAGNOSIS — Z79899 Other long term (current) drug therapy: Secondary | ICD-10-CM | POA: Diagnosis not present

## 2023-07-06 ENCOUNTER — Encounter: Payer: Self-pay | Admitting: Adult Health

## 2023-07-06 ENCOUNTER — Ambulatory Visit: Payer: Medicare HMO | Admitting: Adult Health

## 2023-07-06 VITALS — BP 106/68 | HR 70 | Ht 73.0 in | Wt 256.4 lb

## 2023-07-06 DIAGNOSIS — I639 Cerebral infarction, unspecified: Secondary | ICD-10-CM | POA: Diagnosis not present

## 2023-07-06 DIAGNOSIS — R413 Other amnesia: Secondary | ICD-10-CM | POA: Diagnosis not present

## 2023-07-06 DIAGNOSIS — R2689 Other abnormalities of gait and mobility: Secondary | ICD-10-CM

## 2023-07-06 MED ORDER — ASPIRIN 81 MG PO TBEC: 81.0000 mg | DELAYED_RELEASE_TABLET | Freq: Every day | ORAL | Status: AC

## 2023-07-06 NOTE — Patient Instructions (Addendum)
Continue working with PT for ongoing balance issues   Please let me know if you would like to do any cognitive therapy for memory loss   Please continue to refrain from alcohol use   Restart aspirin 81 mg daily  and continue Crestor 10mg  daily  for secondary stroke prevention  Continue to follow up with PCP regarding cholesterol and blood pressure management  Maintain strict control of hypertension with blood pressure goal below 130/90, diabetes with hemoglobin A1c goal below 7.0 % and cholesterol with LDL cholesterol (bad cholesterol) goal below 70 mg/dL.   Signs of a Stroke? Follow the BEFAST method:  Balance Watch for a sudden loss of balance, trouble with coordination or vertigo Eyes Is there a sudden loss of vision in one or both eyes? Or double vision?  Face: Ask the person to smile. Does one side of the face droop or is it numb?  Arms: Ask the person to raise both arms. Does one arm drift downward? Is there weakness or numbness of a leg? Speech: Ask the person to repeat a simple phrase. Does the speech sound slurred/strange? Is the person confused ? Time: If you observe any of these signs, call 911.     Followup in the future with me in 6 months or call earlier if needed      Thank you for coming to see [place] at [facility]. I hope we have been able to provide you high quality care today.  You may receive a patient satisfaction survey over the next few weeks. We would appreciate your feedback and comments so that we may continue to improve ourselves and the health of our patients.

## 2023-07-06 NOTE — Progress Notes (Signed)
[facility] [address] [place]. [place] [address] [phone]       OFFICE FOLLOW UP NOTE  [name] Date of Birth:  Jan 01, 1956 Medical Record Number:  [id number]   Referring MD: Dibas Koirala  Reason for Referral: Stroke  Chief Complaint  Patient presents with   Follow-up    Rm 8. Alone. He c/o balance difficulty. Going to physical therapy. He says his mouth moves all of the time. He feels weak post hospitalization. He discontinued aspirin 81 mg, was taking eliquis and ran out of medication yesterday. Unsure of next steps.      HPI:   Update 07/06/2023 JM: Patient returns for follow-up visit unaccompanied.  Overall stable without new stroke/TIA symptoms.  Reports residual imbalance, denies much change since prior visit.  Was working with PT/OT back in May/June, is scheduled 8/14 for PT reevaluation. Does need to use door frames and wall to ambulate, does have RW at home but does not routinely use, denies any recent falls.  Also continued short-term memory issues, denies any worsening since prior visit.  Remains on Crestor without side effects. Reports stopping aspirin and being placed on Eliquis but ran out of prescription yesterday.  Patient unsure why he was placed on Eliquis, reports was started after hospital stay in June (see below) but per note review, no mention of Eliquis.  Cardiac monitor negative for A-fib, loop recorder placed on 5/14 without evidence of A-fib thus far. Routinely follows with PCP for stroke risk factor management.  Evaluated at [facility] ED on 6/30 with altered mental status.  Per ED note, patient confused with delusional statements.  Reported last drink 3 weeks ago however friend stated last drink only 3 days PTA, prior to alcohol cessation patient drinking 9-10 beers daily.  Extensive workup largely unremarkable.  Placed on Librium taper with significant for mild symptoms.  He was evaluated by psychiatry who noted long-term  use of Xanax recently discontinued which may also be contributing.  Xanax restarted and Librium discontinued.  CT head negative for acute findings.  Felt symptoms likely in setting of alcohol and benzo withdrawal. Was d/c'd to SNF rehab, returned back home last week.  Reports complete EtOH cessation since discharge, reports drinking excessive alcohol (9-10 beers daily) over the past 30 years.  Denies any tobacco or drug use.      History provided for reference purposes only Consult visit 02/18/2023 Dr. Pearlean Brownie: [name] is a 67 year old Caucasian male seen today for initial office consultation visit for stroke.  History is obtained from the patient and review of collateral notes and electronic medical records.  I have personally reviewed pertinent available imaging films in PACS.  He has past medical history of hypertension, hyperlipidemia, obstructive sleep apnea, depression, COPD, lung adenocarcinoma, gastroesophageal reflux disease, gout, thyroid cancer.  Patient states for the last 6 months or so his benign send gait and balance difficulties.  He states this began suddenly 1 day when he woke up.  Trouble getting out of bed and he fell down.  He was told that he was walking like a drunk.  He did not seek medical help at that time.  The last few months he feels his gait and balance are improving.  He underwent MRI scan of the brain on 11/07/2022 which showed a 1 cm right cerebellar cortical and white matter acute infarct as well as remote age bilateral basal ganglia and corona radiata lacunar infarcts.  There is mild changes of small vessel  disease.  Patient started on aspirin for stroke prevention since which is tolerating well without bruising or bleeding.  He is also on Crestor tolerating well.  His blood pressure is well-controlled today it is 130/67.  He states that he never been diagnosed with sleep apnea though I find the diagnosis listed past medical history.  He has had no further strokelike  symptoms.  Denies any history of syncopal events, palpitations or atrial fibrillation.  He does admit to mild short-term memory and cognitive difficulties.  He lives alone and manages a normal.  Echocardiogram on 11/2522 showed ejection fraction of 65%.  There is mild left atrial dilatation.  Carotid ultrasound showed no significant extracranial stenosis bilaterally.  4 weeks cardiac external monitor showed no evidence of atrial fibrillation with brief runs of supraventricular tachycardia and isolated supraventricular ectopics.  ROS:   14 system review of systems is positive for those listed in HPI and all other systems negative  PMH:  Past Medical History:  Diagnosis Date   Adenocarcinoma, lung, left (HCC) 07/05/2017   Age-related osteoporosis without current pathological fracture 12/12/2021   Anxiety    COPD (chronic obstructive pulmonary disease) (HCC)    Depression    Dry eye syndrome    Dysrhythmia    hx of PAC's   Effusion of elbow joint, right 06/29/2017   Gastric ulcer with perforation but without obstruction (HCC) 04/15/2016   GERD (gastroesophageal reflux disease)    Gout    Hyperlipidemia    Hypertension    Hypothyroidism    OSA (obstructive sleep apnea) 02/18/2023   Papillary thyroid carcinoma s/p total thyroidectomy & CLND 08/27/2017 08/24/2017   Postsurgical hypothyroidism 10/01/2017   Thyroid cancer (HCC)    2018    Social History:  Social History   Socioeconomic History   Marital status: Single    Spouse name: Not on file   Number of children: Not on file   Years of education: Not on file   Highest education level: Not on file  Occupational History   Not on file  Tobacco Use   Smoking status: Former    Current packs/day: 0.00    Average packs/day: 1 pack/day for 40.0 years (40.0 ttl pk-yrs)    Types: Cigarettes    Start date: 05/1979    Quit date: 05/2019    Years since quitting: 4.1   Smokeless tobacco: Never  Vaping Use   Vaping status: Never Used   Substance and Sexual Activity   Alcohol use: Not Currently    Alcohol/week: 0.0 standard drinks of alcohol    Comment: Used to drink a lot -moderate - quit 2020   Drug use: Not Currently    Types: Marijuana    Comment: last use years ago   Sexual activity: Not Currently  Other Topics Concern   Not on file  Social History Narrative   Not on file   Social Determinants of Health   Financial Resource Strain: Not on file  Food Insecurity: Not on file  Transportation Needs: Not on file  Physical Activity: Not on file  Stress: Not on file  Social Connections: Unknown (03/30/2022)   [facility]   Social Network    Social Network: Not on file  Intimate Partner Violence: Unknown (03/06/2022)   Received from [facility]   HITS    Physically Hurt: Not on file    Insult or Talk Down To: Not on file    Threaten Physical Harm: Not on  file    Scream or Curse: Not on file    Medications:   Current Outpatient Medications on File Prior to Visit  Medication Sig Dispense Refill   alendronate (FOSAMAX) 70 MG tablet TAKE 1 TABLET EVERY 7 DAYS WITH A FULL GLASS OF WATER ON AN EMPTY STOMACH. (Patient taking differently: Take 70 mg by mouth once a week.) 12 tablet 3   alprazolam (XANAX) 2 MG tablet Take 2 mg by mouth 3 (three) times daily as needed for sleep or anxiety (anxiety.). Pt taking 1/2 to 1 pill three times a day as needed.     atenolol (TENORMIN) 100 MG tablet Take 100 mg by mouth 2 (two) times daily.     CALCIUM PO Take 1 tablet by mouth daily.     Cholecalciferol (VITAMIN D) 2000 units tablet Take 2,000 Units by mouth daily.     fenofibrate 160 MG tablet Take 160 mg by mouth daily with supper.      hydrocortisone 2.5 % cream Apply 1 application  topically 2 (two) times daily as needed (rash).     levothyroxine (SYNTHROID) 125 MCG tablet TAKE 1 TABLET EVERY DAY 90 tablet 0   Multiple Vitamin (MULTIVITAMIN WITH MINERALS) TABS tablet Take 1  tablet by mouth daily.     omeprazole (PRILOSEC) 20 MG capsule Take 20 mg by mouth daily before breakfast.      rosuvastatin (CRESTOR) 10 MG tablet Take 10 mg by mouth daily.      No current facility-administered medications on file prior to visit.    Allergies:   No Known Allergies   Physical Exam Today's Vitals   07/06/23 1405  BP: 106/68  Pulse: 70  Weight: 256 lb 6.4 oz (116.3 kg)  Height: 6\' 1"  (1.854 m)   Body mass index is 33.83 kg/m.  General: Very pleasant obese unkempt middle-aged Caucasian male, seated, in no evident distress Head: head normocephalic and atraumatic.   Neck: supple with no carotid or supraclavicular bruits Cardiovascular: regular rate and rhythm, no murmurs Musculoskeletal: no deformity Skin:  no rash/petichiae Vascular:  Normal pulses all extremities  Neurologic Exam Mental Status: Awake and fully alert. Oriented to place and time. Recent memory mildly impaired and remote memory intact. Attention span, concentration and fund of knowledge mostly appropriate. Mood and affect appropriate.  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  Extraocular movements full without nystagmus. Visual fields full to confrontation in the left eye.Marland Kitchen Hearing intact. Facial sensation intact. Face, tongue, palate moves normally and symmetrically.  Motor: Normal bulk and tone. Normal strength in all tested extremity muscles. Sensory.: intact to touch , pinprick , position and vibratory sensation.  Coordination: Rapid alternating movements normal in all extremities. Finger-to-nose and heel-to-shin performed accurately bilaterally.   Gait and Station: Arises from chair without difficulty. Stance is normal. Gait is slightly broad-based demonstrates with moderate unsteadiness, adequate stride length although decreased appetite.  Greater difficulty with turns. No use of AD.  Tandem walk and heel toe not attempted. Reflexes: 1+ and  symmetric. Toes downgoing.      07/06/2023    2:41 PM  MMSE - Mini Mental State Exam  Orientation to time 3  Orientation to Place 5  Registration 3  Attention/ Calculation 5  Recall 3  Language- name 2 objects 2  Language- repeat 1  Language- follow 3 step command 3  Language- read & follow direction 1  Write a sentence 1  Copy design 0  Total  score 27       ASSESSMENT: 67 year old Caucasian male with subacute balance and gait difficulties for the last 6 months with abnormal MRI scan showing a subacute right cerebellar infarct in December 2023 as well as bilateral subcortical lacunar of remote age.  Vascular risk factors of obesity, hyperlipidemia, hypertension, at risk for obstructive sleep apnea, EtOH abuse and silent cerebral infarcts.  He also has mild cognitive impairment. S/p ILR 03/2023     PLAN:  1.  Right cerebellar infarct 2. R VA occlusion  -Continued imbalance and gait unsteadiness - restart PT as scheduled next week. Discussed consider use of AD at times when feeling more off balance, will be ambulating for longer distance or when on uneven ground. Advised to further discuss with PT -CTA head/neck 03/2023 showed right VA occlusion possibly responsible for stroke, also 50% narrowing of left ICA.  Continue medical therapy and aggressive stroke risk factor management. Plan on repeat in 1 year for surveillance monitoring -Restart aspirin and continue Crestor for secondary stroke prevention measures managed by PCP -unclear reason he was placed on Eliquis - unable to verify this being prescribed via epic.  No clear indication for Eliquis use. ILR placed 5/14 which has not shown A fib thus far. No hx or recent dx of DVT or PE. Advised no need to restart at this time  -Continue close PCP follow-up for aggressive stroke risk factor management  -Stroke labs 01/2023: A1c 5.5, LDL 75  3.  Mild cognitive impairment  -Dementia panel within normal limits  -MMSE today 27/30  -Likely  post stroke and chronic EtOH abuse.  Encouraged continued EtOH cessation.  Discussed importance of aggressive stroke risk factor management.   -Discussed participating in SLP for cognitive therapy but declines interest at this time  4.  Sleep apnea  -Prior diagnosis by [facility] - was advised to ensure follow up with their office  -Discussed importance of sleep apnea management as untreated sleep apnea increases risk of additional strokes and cardiovascular disease.  Untreated apnea can also affect memory      Follow-up in 6 months or call earlier if needed     I spent 45 minutes of face-to-face and non-face-to-face time with patient.  This included previsit chart review, lab review, study review, order entry, electronic health record documentation, patient education and discussion regarding above diagnoses and treatment plan and answered all other questions to patient's satisfaction  Ihor Austin, [facility]  [facility] [address] [place], [place] [address]  Phone [phone] Fax [phone] Note: This document was prepared with digital dictation and possible smart phrase technology. Any transcriptional errors that result from this process are unintentional.

## 2023-07-08 DIAGNOSIS — M6281 Muscle weakness (generalized): Secondary | ICD-10-CM | POA: Diagnosis not present

## 2023-07-08 DIAGNOSIS — I10 Essential (primary) hypertension: Secondary | ICD-10-CM | POA: Diagnosis not present

## 2023-07-08 DIAGNOSIS — J449 Chronic obstructive pulmonary disease, unspecified: Secondary | ICD-10-CM | POA: Diagnosis not present

## 2023-07-09 DIAGNOSIS — F4000 Agoraphobia, unspecified: Secondary | ICD-10-CM | POA: Diagnosis not present

## 2023-07-09 DIAGNOSIS — F10251 Alcohol dependence with alcohol-induced psychotic disorder with hallucinations: Secondary | ICD-10-CM | POA: Diagnosis not present

## 2023-07-09 DIAGNOSIS — F13239 Sedative, hypnotic or anxiolytic dependence with withdrawal, unspecified: Secondary | ICD-10-CM | POA: Diagnosis not present

## 2023-07-09 DIAGNOSIS — L89156 Pressure-induced deep tissue damage of sacral region: Secondary | ICD-10-CM | POA: Diagnosis not present

## 2023-07-09 DIAGNOSIS — U071 COVID-19: Secondary | ICD-10-CM | POA: Diagnosis not present

## 2023-07-09 DIAGNOSIS — R062 Wheezing: Secondary | ICD-10-CM | POA: Diagnosis not present

## 2023-07-09 DIAGNOSIS — R4182 Altered mental status, unspecified: Secondary | ICD-10-CM | POA: Diagnosis not present

## 2023-07-09 DIAGNOSIS — J441 Chronic obstructive pulmonary disease with (acute) exacerbation: Secondary | ICD-10-CM | POA: Diagnosis not present

## 2023-07-09 DIAGNOSIS — I10 Essential (primary) hypertension: Secondary | ICD-10-CM | POA: Diagnosis not present

## 2023-07-12 NOTE — Therapy (Signed)
OUTPATIENT PHYSICAL THERAPY NEURO EVALUATION   Patient Name: Timothy Conway MRN: [id number] DOB:11-27-1956, 67 y.o., male Today's Date: 07/14/2023   END OF SESSION:  PT End of Session - 07/14/23 0932     Visit Number 1    Date for PT Re-Evaluation 08/25/23    Authorization Type [employer]    PT Start Time 0932    PT Stop Time 1016    PT Time Calculation (min) 44 min    Activity Tolerance Patient tolerated treatment well    Behavior During Therapy [facility] for tasks assessed/performed             Past Medical History:  Diagnosis Date   Adenocarcinoma, lung, left (HCC) 07/05/2017   Age-related osteoporosis without current pathological fracture 12/12/2021   Anxiety    COPD (chronic obstructive pulmonary disease) (HCC)    Depression    Dry eye syndrome    Dysrhythmia    hx of PAC's   Effusion of elbow joint, right 06/29/2017   Gastric ulcer with perforation but without obstruction (HCC) 04/15/2016   GERD (gastroesophageal reflux disease)    Gout    Hyperlipidemia    Hypertension    Hypothyroidism    OSA (obstructive sleep apnea) 02/18/2023   Papillary thyroid carcinoma s/p total thyroidectomy & CLND 08/27/2017 08/24/2017   Postsurgical hypothyroidism 10/01/2017   Thyroid cancer (HCC)    2018   Past Surgical History:  Procedure Laterality Date   ABDOMINAL SURGERY     hemmoraging ulcer   BRONCHIAL BIOPSY  06/25/2020   Procedure: BRONCHIAL BIOPSIES;  Surgeon: [name], MD;  Location: [facility] ENDOSCOPY;  Service: Pulmonary;;   BRONCHIAL BRUSHINGS  06/25/2020   Procedure: BRONCHIAL BRUSHINGS;  Surgeon: [name], MD;  Location: [facility] ENDOSCOPY;  Service: Pulmonary;;   BRONCHIAL NEEDLE ASPIRATION BIOPSY  06/25/2020   Procedure: BRONCHIAL NEEDLE ASPIRATION BIOPSIES;  Surgeon: [name], MD;  Location: [facility] ENDOSCOPY;  Service: Pulmonary;;   BRONCHIAL WASHINGS  06/25/2020   Procedure: BRONCHIAL WASHINGS;  Surgeon: [name], MD;  Location: [facility] ENDOSCOPY;  Service:  Pulmonary;;   CATARACT EXTRACTION Right    COLONOSCOPY     EYE SURGERY Right    x 4  between ages 58-13   FIDUCIAL MARKER PLACEMENT  06/25/2020   Procedure: FIDUCIAL MARKER PLACEMENT;  Surgeon: [name], MD;  Location: [facility] ENDOSCOPY;  Service: Pulmonary;;   THYROIDECTOMY N/A 08/27/2017   Procedure: TOTAL THYROIDECTOMY WITH LIMITED LYMPH NODE DISSECTION;  Surgeon: [name], MD;  Location: [facility] ORS;  Service: General;  Laterality: N/A;   UPPER GI ENDOSCOPY     VIDEO BRONCHOSCOPY WITH ENDOBRONCHIAL NAVIGATION N/A 06/25/2020   Procedure: VIDEO BRONCHOSCOPY WITH ENDOBRONCHIAL NAVIGATION;  Surgeon: [name], MD;  Location: [facility] ENDOSCOPY;  Service: Pulmonary;  Laterality: N/A;   Patient Active Problem List   Diagnosis Date Noted   Sleep apnea-like behavior 04/08/2023   Excessive daytime sleepiness 02/18/2023   History of gastric ulcer 02/18/2023   HTN (hypertension), benign 02/18/2023   Hx of papillary thyroid carcinoma 02/18/2023   Hypercholesteremia 02/18/2023   Low back pain 02/18/2023   Major depression, single episode 02/18/2023   Morbid obesity (HCC) 02/18/2023   Nodule of left lung 02/18/2023   OSA (obstructive sleep apnea) 02/18/2023   Non-restorative sleep 02/18/2023   Penetrating ulcer of aorta (HCC) 02/18/2023   Protrusion of intervertebral disc of thoracic region 02/18/2023   Seborrheic dermatitis 02/18/2023   Vitamin D deficiency 02/18/2023   Late effect of cerebrovascular  accident (CVA) 01/05/2023   Dyslipidemia 01/05/2023   History of smoking 01/05/2023   Age-related osteoporosis without current pathological fracture 12/12/2021   Postsurgical hypothyroidism 10/01/2017   Papillary thyroid carcinoma s/p total thyroidectomy & CLND 08/27/2017 08/24/2017   Adenocarcinoma, lung, left (HCC) 07/05/2017   COPD (chronic obstructive pulmonary disease) (HCC) 07/05/2017   Effusion of elbow joint, right 06/29/2017   Gastric ulcer with perforation but without obstruction  (HCC) 04/15/2016    PCP: [name], MD   REFERRING PROVIDER: [name], MD   REFERRING DIAG: R53.81 (ICD-10-CM) - Declining functional status   THERAPY DIAG:  Unsteadiness on feet  Other abnormalities of gait and mobility  Muscle weakness (generalized)  History of falling  RATIONALE FOR EVALUATION AND TREATMENT: Rehabilitation  ONSET DATE: December 2023  NEXT MD VISIT: Unknown   SUBJECTIVE:                                                                                                                                                                                                         SUBJECTIVE STATEMENT: Pt reports his main problems are weakness in his knees and problems with his balance, esp in the mornings. He uses a RW first thing in the morning for "safety sake". He denies any recent falls, although documentation during PT episode earlier this year indicates at least 6 falls in April and May. Still performing HEP from recent PT episode ~qod.   PAIN: Are you having pain? 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   PRECAUTIONS: Other: Implanted loop recorder & completely blind in his right eye    RED FLAGS: None  WEIGHT BEARING RESTRICTIONS: No  FALLS:  Has patient fallen in last 6 months? No and Yes. Number of falls at least 6 in April/May per documentation in recent PT episode, however patient denies any recent falls  LIVING ENVIRONMENT:  Lives with: lives alone Lives in: House/apartment Stairs: No Has following equipment at  home: Single point cane, Environmental consultant - 2 wheeled, shower chair, and Grab bars  OCCUPATION: Retired  PLOF: Independent and Leisure: watching TV, mostly sedentary   PATIENT GOALS: "Strengthening my knees and improve my balance."   OBJECTIVE: (objective measures  completed at initial evaluation unless otherwise dated)  DIAGNOSTIC FINDINGS:  04/06/23 - CT angio head & neck: IMPRESSION: 1. No large vessel occlusion. 2. Severe stenosis of the right vertebral artery origin. 3. Approximately 50% stenosis of the proximal left ICA in the neck. 4. Left upper lobe treatment change, incompletely imaged and not well assessed on this study. Please see prior CT chest. 5. Bulky bridging anterior osteophytes at multiple cervical levels.  04/09/23 - CT chest: IMPRESSION: 1. Unchanged post treatment/post radiation appearance of the anterior left upper lobe with irregular scarring and volume loss with biopsy marking clips and or fiducial markers. Most discrete residual nodule anteriorly measures 1.1 x 0.8 cm, unchanged. 2. No evidence of recurrent or metastatic disease in the chest. 3. Mildly coarse contour of the liver, suggestive of cirrhosis.  11/07/22 - MRI brain: IMPRESSION: 1. Positive for a small subacute lacunar infarct in the Right cerebellum. No associated hemorrhage or mass effect. And underlying chronic small vessel ischemic disease in the cerebral white matter and basal ganglia. 2. No metastatic disease identified. 3. Bulky degenerative anterior cervical spine spurring at C3-C4.  COGNITION: Overall cognitive status: Impaired - reports STM deficits   SENSATION: WFL  COORDINATION: Grossly WFL  MUSCLE LENGTH: Hamstrings: Mod tight B ITB: Mild/mod tight B Piriformis: Mod/severe tight B Hip flexors: Mod tight B Quads: Mod tight B Heelcord: NT  POSTURE:  rounded shoulders, forward head, and wide BOS  LOWER EXTREMITY ROM:    B hips limited due to muscle tightness with B knee extension  limited by HS tightness and weakness Active  Right Eval Left Eval  Knee flexion 128 132  Knee extension -9 LAQ / 0 supine -28 LAQ / 0 supine    LOWER EXTREMITY MMT:    MMT Right Eval Left Eval  Hip flexion 4 4  Hip extension 3+ 4-  Hip abduction 4- 4-  Hip adduction 4 4  Hip internal rotation 4 4  Hip external rotation 4 4  Knee flexion 4+ 5  Knee extension 4 4  Ankle dorsiflexion 4+ 5  Ankle plantarflexion    Ankle inversion    Ankle eversion    (Blank rows = not tested)  BED MOBILITY:  Grossly independent  TRANSFERS: Assistive device utilized: None  Sit to stand: Complete Independence Stand to sit: Complete Independence Chair to chair:  NT Floor:  NT  GAIT: Distance walked: Clinic distances Assistive device utilized: None Level of assistance: Complete Independence and SBA Gait pattern: step through pattern, decreased arm swing- Right, decreased arm swing- Left, shuffling, wide BOS, poor foot clearance- Right, and poor foot clearance- Left Comments: Occasional staggering due to decreased foot clearance  STAIRS:  Level of Assistance: SBA  Stair Negotiation Technique: Alternating Pattern  with Single Rail on Right  Number of Stairs: 14   Height of Stairs: 7"  Comments: Inconsistent foot clearance on step with heels dragging down edge of step on some steps on descent  FUNCTIONAL TESTS:  5 times sit to stand: 15.21 sec w/o UE assist Timed up and go (TUG): 10.47 sec w/o AD 10 meter walk test: 9.25 sec; Gait speed: 3.55 ft/sec  Berg Balance Scale: TBA next visit 6 Functional gait assessment: 18/30; < 19 = high risk fall   PATIENT SURVEYS:  ABC scale 1390 / 1600 = 86.9 %   TODAY'S TREATMENT:   07/14/23 Eval only   PATIENT EDUCATION:  Education details: PT eval findings, anticipated POC, and need for further assessment of Berg balance scale  Person educated: Patient Education method: Explanation Education comprehension: verbalized understanding  HOME  EXERCISE PROGRAM: TBD  Access Code: [email or web address] - from prior PT episode April-June 2024   ASSESSMENT:  CLINICAL IMPRESSION: Timothy Conway is a 67 y.o. male who was seen today for physical therapy evaluation and treatment for declining functional status. He reports his main concerns are weakness in his knees and problems with his balance, especially in the morning, which originated following his cerebellar CVA in December 2023.  He completed 4 visits of outpatient PT earlier this year and reports he continues to perform these exercises approximately every other day.  He currently uses a RW first thing in the morning due to balance issues but feels like things improve once he gets moving and typically does not use an assistive device after that - he presents to PT eval without assistive device.  He denies any falls in the past 6 months however documentation during the previous PT episode indicated at least 6 fall in April and May.  Current deficits include decreased LE flexibility, limited hip and knee ROM, core/LE weakness and decreased balance.  Standardized balance testing WNL for TUG, however 5xSTS slightly above current fall risk threshold and FGA score of 18/30 indicating a high risk for falls.  Will plan for further static balance testing with Sharlene Motts next visit.  Adnrew will benefit from skilled PT to address above deficits to improve mobility and activity tolerance with decreased risk for falls.   OBJECTIVE IMPAIRMENTS: Abnormal gait, decreased activity tolerance, decreased balance, decreased coordination, decreased endurance, decreased knowledge of condition, decreased knowledge of use of DME, decreased mobility, difficulty walking, decreased ROM, decreased strength, decreased safety awareness, impaired flexibility, improper body mechanics, postural dysfunction, and pain.   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   REHAB POTENTIAL: Good  CLINICAL DECISION MAKING: Stable/uncomplicated  EVALUATION COMPLEXITY: Low   GOALS: Goals reviewed with patient? Yes  SHORT TERM GOALS: Target date: 08/04/2023  Patient will be independent with initial HEP to improve outcomes and carryover.  Baseline: TBD Goal status: INITIAL  2.  Patient will be educated on strategies to decrease risk of falls.  Baseline:  Goal status: INITIAL  3.  Patient will improve 5x STS time to </= 12 seconds for improved efficiency and safety with transfers. Baseline: 15.21 sec w/o UE assist Goal status: INITIAL  LONG TERM GOALS: Target date: 08/25/2023  Patient will be independent with advanced/ongoing HEP to facilitate ability to maintain/progress functional gains from skilled physical therapy services. Baseline:  Goal status: INITIAL  2.  Patient will be able to ambulate without gait deviations or staggering with or w/o LRAD on variable surfaces with good safety to access community.  Baseline: Inconsistent foot clearance leading to occasional stagger Goal status: INITIAL  3.  Patient will be able to step up/down curb safely with LRAD for safety with community ambulation.  Baseline:  Goal status: INITIAL   4.  Patient will demonstrate improved B LE strength to >/= 4+/5 for improved stability and ease of mobility . Baseline: Refer to above MMT table Goal status: INITIAL  5.  Patient will improve Berg score by >/= 8 points or to >/= 52/56 to improve safety and stability with  ADLs in standing and reduce risk for falls. (MCID= 8 points)  Baseline: TBA next visit Goal status: INITIAL  6. Patient will improve FGA score to at least 22/30 to improve gait stability and reduce risk for falls. Baseline: 18/30 Goal status: INITIAL  7.  Patient will report >/= 90% on ABC scale to demonstrate improved functional ability. Baseline: 1390 / 1600 = 86.9 % Goal status: INITIAL   PLAN:  PT FREQUENCY: 1-2x/week  PT DURATION: 6 weeks  PLANNED INTERVENTIONS: Therapeutic exercises, Therapeutic activity, Neuromuscular re-education, Balance training, Gait training, Patient/Family education, Self Care, Joint mobilization, Stair training, DME instructions, Cryotherapy, Moist heat, Taping, Manual therapy, and Re-evaluation  PLAN FOR NEXT SESSION: Complete Berg balance testing; review ongoing HEP from recent PT episode and modify/update as indicated; review fall prevention/fall safety checklist; lower extremity flexibility stretching; core/lumbopelvic and LE strengthening; balance training; dynamic gait training   Marry Guan, PT 07/14/2023, 2:24 PM

## 2023-07-14 ENCOUNTER — Ambulatory Visit: Payer: Medicare HMO | Attending: Family Medicine | Admitting: Physical Therapy

## 2023-07-14 ENCOUNTER — Other Ambulatory Visit: Payer: Self-pay

## 2023-07-14 DIAGNOSIS — R2681 Unsteadiness on feet: Secondary | ICD-10-CM | POA: Insufficient documentation

## 2023-07-14 DIAGNOSIS — Z9181 History of falling: Secondary | ICD-10-CM | POA: Diagnosis not present

## 2023-07-14 DIAGNOSIS — R2689 Other abnormalities of gait and mobility: Secondary | ICD-10-CM | POA: Insufficient documentation

## 2023-07-14 DIAGNOSIS — M6281 Muscle weakness (generalized): Secondary | ICD-10-CM | POA: Diagnosis not present

## 2023-07-15 NOTE — Progress Notes (Signed)
[facility] Summary Report / Loop Recorder 

## 2023-07-20 ENCOUNTER — Other Ambulatory Visit: Payer: Self-pay

## 2023-07-20 DIAGNOSIS — C73 Malignant neoplasm of thyroid gland: Secondary | ICD-10-CM

## 2023-07-20 MED ORDER — LEVOTHYROXINE SODIUM 125 MCG PO TABS
125.0000 ug | ORAL_TABLET | Freq: Every day | ORAL | 0 refills | Status: DC
Start: 2023-07-20 — End: 2023-07-26

## 2023-07-21 ENCOUNTER — Ambulatory Visit: Payer: Medicare HMO

## 2023-07-26 ENCOUNTER — Encounter: Payer: Self-pay | Admitting: Internal Medicine

## 2023-07-26 ENCOUNTER — Ambulatory Visit: Payer: Medicare HMO | Admitting: Internal Medicine

## 2023-07-26 VITALS — BP 100/75 | Ht 73.0 in | Wt 250.8 lb

## 2023-07-26 DIAGNOSIS — E559 Vitamin D deficiency, unspecified: Secondary | ICD-10-CM | POA: Diagnosis not present

## 2023-07-26 DIAGNOSIS — C73 Malignant neoplasm of thyroid gland: Secondary | ICD-10-CM | POA: Diagnosis not present

## 2023-07-26 DIAGNOSIS — M810 Age-related osteoporosis without current pathological fracture: Secondary | ICD-10-CM

## 2023-07-26 DIAGNOSIS — E890 Postprocedural hypothyroidism: Secondary | ICD-10-CM | POA: Diagnosis not present

## 2023-07-26 MED ORDER — LEVOTHYROXINE SODIUM 125 MCG PO TABS
125.0000 ug | ORAL_TABLET | Freq: Every day | ORAL | 3 refills | Status: DC
Start: 2023-07-26 — End: 2023-09-16

## 2023-07-26 NOTE — Progress Notes (Addendum)
Patient ID: Timothy Conway, male   DOB: Apr 12, 1956, 67 y.o.   MRN: [id number]   HPI  Timothy Conway is a 67 y.o.-year-old male, initially referred by Dr. Gerrit Friends, presenting for follow-up for papillary thyroid cancer, postsurgical hypothyroidism, and osteoporosis.  Last visit 1 year ago.  Interim history: In 2021, he was diagnosed with 11 mm left adenocarcinoma. He had radiotactic RxTx.  CT scan in 2021 showed a decrease in size of his tumor.  He continues to be followed by oncology. Per review the chart, he presented with AMS 3 days after stopping alcohol 05/30/2023-previously drinking 9-10 drinks a day.  A CT of the head at that time showed chronic bilateral basal ganglia and cerebellar infarcts. As of now, he quit drinking, he mentions. He has leg pain >> going to PT. he feels good otherwise, without complaints.  ThyCA: Reviewed and addended his thyroid cancer history: Pt. has been found to have a hypermetabolic thyroid nodule on a PET scan (07/13/2017) obtained by Dr. Delton Coombes, his pulmonologist, to investigate a lung nodule.  A thyroid U/S (07/30/2017) showed a L lobe 1.3 x 1.0 x 1.2 cm nodule, solid, hypoechoic, lobulated/irregular, with peripheral calcifications. Bx of this nodule (07/30/2017): PTC  Total thyroidectomy (08/27/2017) by Dr. Gerrit Friends: Diagnosis 1. Thyroid, thyroidectomy, total - PAPILLARY THYROID CARCINOMA, CLASSIC TYPE, SPANNING 1.4 CM. - NO EXTRATHYROIDAL EXTENSION. - RESECTION MARGINS ARE NEGATIVE. - SEE ONCOLOGY TABLE. 2. Lymph node, biopsy, central compartment - THREE OF THREE LYMPH NODES NEGATIVE FOR CARCINOMA (0/3). - ONE BENIGN PARATHYROID. Microscopic Comment 1. THYROID Specimen: Total thyroid with central compartment lymph nodes. Procedure (including lymph node sampling if applicable): Total thyroidectomy with central lymph node biopsy. Specimen Integrity (intact/fragmented): Intact. Tumor focality: Unifocal. Dominant tumor: Maximum tumor size (cm): 1.4  cm. Tumor laterality: Left. Histologic type (including subtype and/or unique features as applicable): Papillary thyroid carcinoma, classic type. Tumor capsule: Present. Extrathyroidal extension: Absent. Capsular invasion with degree of invasion if present: N/A. Margins: Negative. Lymphatic or vascular invasion: Not identified. Lymph nodes: # examined 3; # positive; 0 Extracapsular extension (if applicable): N/A. TNM code: pT1b, pN0 Non-neoplastic thyroid: Largely unremarkable. Comments: There tumor is largely fibrotic and calcified with only a small portion of viable tumor.  Neck U/S (09/25/2019): The thyroid gland is surgically absent. Evaluation of the left and right resection bed demonstrates no evidence of residual thyroid tissue or nodularity. No suspicious lymphadenopathy is identified. IMPRESSION: Surgical changes of prior total thyroidectomy without evidence of residual or recurrent thyroid tissue, nodularity or lymphadenopathy.  PET/CT (09/15/2020): Increased uptake in the base of the tongue and anterior paravertebral muscles  CT neck (07/24/2020): Increased uptake is most likely related to tonsillar inflammation  CT chest (11/14/2020) without contrast: No suspicious masses in mediastinum or neck.  CT chest (06/24/2021): Mediastinum/Nodes: No enlarged mediastinal, hilar, or axillary lymph nodes. Thyroid gland, trachea, and esophagus demonstrate no significant findings.  CT chest (10/16/2021): Unchanged appearance of predominantly bandlike post radiation fibrosis and adjacent ground-glass about fiducial markers of the anterior left upper lobe. No evidence of recurrent or metastatic disease in the chest.  CT chest (04/15/2022): Mediastinum/Nodes: No pathologically enlarged mediastinal or hilar lymph nodes. Please note that accurate exclusion of hilar adenopathy is limited on noncontrast CT scans. Esophagus is unremarkable in appearance. No axillary lymphadenopathy.    Lungs/Pleura: Nodular area of architectural distortion surrounded by septal thickening and ground-glass attenuation in the left upper lobe, adjacent to 2 fiducial markers, compatible with a treated neoplasm. This appears grossly stable compared  to the prior study. No other new suspicious appearing pulmonary nodules or masses are noted. When compared to the prior study there has been worsening scarring/atelectasis in the right middle lobe, predominantly in the lateral segment.   IMPRESSION: 1. Stable appearance of treated neoplasm in the left upper lobe. No definitive findings to suggest residual or metastatic disease in the thorax. 2. Worsening scarring/atelectasis in the lateral segment of the right middle lobe. 3. Aortic atherosclerosis. 4. Hepatic steatosis. 5. Additional incidental findings, as above.  CT chest (04/09/2023): 1. Unchanged post treatment/post radiation appearance of the anterior left upper lobe with irregular scarring and volume loss with biopsy marking clips and or fiducial markers. Most discrete residual nodule anteriorly measures 1.1 x 0.8 cm, unchanged. 2. No evidence of recurrent or metastatic disease in the chest. 3. Mildly coarse contour of the liver, suggestive of cirrhosis.  Thyroglobulin is detectable: Lab Results  Component Value Date   THYROGLB 0.2 (L) 06/05/2022   THYROGLB 0.3 (L) 09/11/2021   THYROGLB 0.1 (L) 10/29/2020   THYROGLB 0.7 (L) 09/17/2020   THYROGLB 0.1 (L) 09/14/2019   THYROGLB 0.2 (L) 09/08/2018   THYROGLB 0.1 (L) 03/23/2018   His antithyroid antibodies are undetectable: Lab Results  Component Value Date   THGAB <1 06/05/2022   THGAB <1 09/11/2021   THGAB <1 10/29/2020   THGAB <1 09/17/2020   THGAB <1 09/14/2019   THGAB <1 09/08/2018   THGAB <1 03/23/2018   Postsurgical hypothyroidism  He is on Levothyroxine 125 mcg daily (last dose decrease 09/2021): - in am - fasting - at least 30 min from b'fast (however, he  usually skips breakfast) - + calcium 1x a day with dinner - no iron - + multivitamins at night - + PPIs at night (Omeprazole) - not on Biotin + Magnesium and Zinc - at night.  Reviewed his TFTs:  Lab Results  Component Value Date   TSH 0.602 02/18/2023   TSH 1.60 06/05/2022   TSH 0.93 12/12/2021   TSH 0.19 (L) 10/28/2021   TSH 0.15 (L) 09/11/2021   TSH 0.76 12/25/2020   TSH 0.28 (L) 10/29/2020   TSH 12.27 (H) 09/17/2020   TSH 0.64 09/14/2019   TSH 4.66 (H) 09/08/2018   FREET4 1.13 06/05/2022   FREET4 1.10 12/12/2021   FREET4 0.89 10/28/2021   FREET4 1.17 09/11/2021   FREET4 1.19 12/25/2020   FREET4 1.13 10/29/2020   FREET4 0.77 09/17/2020   FREET4 1.04 09/14/2019   FREET4 0.73 09/08/2018   FREET4 0.87 03/23/2018    Pt denies: - feeling nodules in neck - hoarseness - dysphagia - choking  Unknown family history of thyroid disease or thyroid cancer since he was adopted. No h/o radiation tx to head or neck.  No herbal supplements. No Biotin use. No recent steroids use.   Clinical osteoporosis:  He was found to have a vertebral fracture after fall from level ground Spring 2022. He fell onto his tailbone and fractured 1 vertebra.  He had kyphoplasty and then had another compression fracture.  His back pain improved since then.    MRI lumbar spine (08/09/2021): 1. Compression fracture of the T11 vertebral body status post vertebral augmentation with persistent marrow edema. 2. Minor compression fracture of the superior endplate of T12 on the right side with persistent marrow edema without significant height loss.  CT chest (04/15/2022): Musculoskeletal: Chronic compression fracture of T11 with 30% loss of anterior vertebral body height. Post vertebroplasty changes are noted at T11 and T12. Several old  healed bilateral rib fractures are again noted. There are no aggressive appearing lytic or blastic lesions noted in the visualized portions of the skeleton.  CT chest  (04/09/2023): No acute osseous findings. Unchanged vertebral cement augmentation of T11 and T12.  He had a bone density scan several years ago in [facility] reportedly showing osteopenia.  A repeat bone density scan obtained showed osteopenia:  DXA: 09/16/2021 ([facility]) Lumbar spine L1-L4 Femoral neck (FN)  T-score  1.2 RFN: -1.0 LFN: -1.2    We started Fosamax 70 mg weekly in 08/2021.  He tolerates this well.  No other fractures as an adult, but had a wrist fracture as a child.  He has a history of vitamin D deficiency, but latest level was normal: Lab Results  Component Value Date   VD25OH 49.05 06/05/2022   VD25OH 60.58 09/11/2021   She is on vitamin D 2000 units daily.  ROS: + See HPI + tremors  I reviewed pt's medications, allergies, PMH, social hx, family hx, and changes were documented in the history of present illness. Otherwise, unchanged from my initial visit note.  Past Medical History:  Diagnosis Date   Adenocarcinoma, lung, left (HCC) 07/05/2017   Age-related osteoporosis without current pathological fracture 12/12/2021   Anxiety    COPD (chronic obstructive pulmonary disease) (HCC)    Depression    Dry eye syndrome    Dysrhythmia    hx of PAC's   Effusion of elbow joint, right 06/29/2017   Gastric ulcer with perforation but without obstruction (HCC) 04/15/2016   GERD (gastroesophageal reflux disease)    Gout    Hyperlipidemia    Hypertension    Hypothyroidism    OSA (obstructive sleep apnea) 02/18/2023   Papillary thyroid carcinoma s/p total thyroidectomy & CLND 08/27/2017 08/24/2017   Postsurgical hypothyroidism 10/01/2017   Thyroid cancer (HCC)    2018   Past Surgical History:  Procedure Laterality Date   ABDOMINAL SURGERY     hemmoraging ulcer   BRONCHIAL BIOPSY  06/25/2020   Procedure: BRONCHIAL BIOPSIES;  Surgeon: [name], MD;  Location: [facility] ENDOSCOPY;  Service: Pulmonary;;   BRONCHIAL BRUSHINGS  06/25/2020   Procedure: BRONCHIAL BRUSHINGS;   Surgeon: [name], MD;  Location: [facility] ENDOSCOPY;  Service: Pulmonary;;   BRONCHIAL NEEDLE ASPIRATION BIOPSY  06/25/2020   Procedure: BRONCHIAL NEEDLE ASPIRATION BIOPSIES;  Surgeon: [name], MD;  Location: [facility] ENDOSCOPY;  Service: Pulmonary;;   BRONCHIAL WASHINGS  06/25/2020   Procedure: BRONCHIAL WASHINGS;  Surgeon: [name], MD;  Location: [facility] ENDOSCOPY;  Service: Pulmonary;;   CATARACT EXTRACTION Right    COLONOSCOPY     EYE SURGERY Right    x 4  between ages 78-13   FIDUCIAL MARKER PLACEMENT  06/25/2020   Procedure: FIDUCIAL MARKER PLACEMENT;  Surgeon: [name], MD;  Location: [facility] ENDOSCOPY;  Service: Pulmonary;;   THYROIDECTOMY N/A 08/27/2017   Procedure: TOTAL THYROIDECTOMY WITH LIMITED LYMPH NODE DISSECTION;  Surgeon: [name], MD;  Location: [facility] ORS;  Service: General;  Laterality: N/A;   UPPER GI ENDOSCOPY     VIDEO BRONCHOSCOPY WITH ENDOBRONCHIAL NAVIGATION N/A 06/25/2020   Procedure: VIDEO BRONCHOSCOPY WITH ENDOBRONCHIAL NAVIGATION;  Surgeon: [name], MD;  Location: [facility] ENDOSCOPY;  Service: Pulmonary;  Laterality: N/A;   Social History   Social History   Marital status: Single    Spouse name: N/A   Number of children: 0   Occupational History   n/a   Social History Main Topics  Smoking status: Current Every Day Smoker    Packs/day: 1.00    Years: 40.00    Types: Cigarettes   Smokeless tobacco: Never Used     Comment: 1 1/2 per day   Alcohol use 0.6 oz/week    3-4 Cans of beer per day   Drug use: No   Current Outpatient Medications on File Prior to Visit  Medication Sig Dispense Refill   alendronate (FOSAMAX) 70 MG tablet TAKE 1 TABLET EVERY 7 DAYS WITH A FULL GLASS OF WATER ON AN EMPTY STOMACH. (Patient taking differently: Take 70 mg by mouth once a week.) 12 tablet 3   alprazolam (XANAX) 2 MG tablet Take 2 mg by mouth 3 (three) times daily as needed for sleep or anxiety (anxiety.). Pt taking 1/2 to 1 pill three times a day as needed.  (Patient not taking: Reported on 07/14/2023)     aspirin EC 81 MG tablet Take 1 tablet (81 mg total) by mouth daily. Swallow whole.     atenolol (TENORMIN) 100 MG tablet Take 100 mg by mouth 2 (two) times daily.     CALCIUM PO Take 1 tablet by mouth daily.     Cholecalciferol (VITAMIN D) 2000 units tablet Take 2,000 Units by mouth daily.     fenofibrate 160 MG tablet Take 160 mg by mouth daily with supper.      hydrocortisone 2.5 % cream Apply 1 application  topically 2 (two) times daily as needed (rash).     levothyroxine (SYNTHROID) 125 MCG tablet Take 1 tablet (125 mcg total) by mouth daily. 90 tablet 0   Multiple Vitamin (MULTIVITAMIN WITH MINERALS) TABS tablet Take 1 tablet by mouth daily.     omeprazole (PRILOSEC) 20 MG capsule Take 20 mg by mouth daily before breakfast.      rosuvastatin (CRESTOR) 10 MG tablet Take 10 mg by mouth daily.      No current facility-administered medications on file prior to visit.   No Known Allergies Family History  Adopted: Yes   PE: BP 100/75   Ht 6\' 1"  (1.854 m)   Wt 250 lb 12.8 oz (113.8 kg)   SpO2 94%   BMI 33.09 kg/m  Wt Readings from Last 3 Encounters:  07/26/23 250 lb 12.8 oz (113.8 kg)  07/06/23 256 lb 6.4 oz (116.3 kg)  04/13/23 259 lb 3.2 oz (117.6 kg)   Constitutional: overweight, in NAD Eyes: EOMI, no exophthalmos ENT:no neck masses palpated, no cervical lymphadenopathy Cardiovascular: RRR, No MRG Respiratory: CTA B Musculoskeletal: no deformities Skin: no rashes Neurological: + tremor with outstretched hands  ASSESSMENT: 1. Thyroid cancer - see HPI  2. Postsurgical Hypothyroidism  3. Vertebral fracture  PLAN:  1. Papillary Thyroid Cancer -Patient with history of classic type papillary thyroid cancer, not very large, encapsulated, without extrathyroidal extension, without lymphovascular invasion and negative lymph node biopsies.  Therefore, his cancer was considered low risk and we decided to continue without RAI  treatment per the 2015 ATA thyroid cancer guidelines.  We are following him with thyroglobulin tumor marker and also with neck ultrasounds.  We did discuss that even if he ends up needing RAI treatment as a salvage therapy in the future, his prognosis would likely not be changed. -The latest neck ultrasound did not show any metastases or recurrences in the neck.  Also, PET scan and CT scans obtained during his lung cancer staging did not show thyroid cancer recurrence or metastasis.  Latest CT scan report is from 03/2023 and this  was reviewed today -At last check, thyroglobulin level was slightly better, at 0.2, previously 0.3.  This is not undetectable, however, in the absence of RAI treatment, we discussed that the thyroglobulin levels can be detectable and fluctuate.  ATA antibodies are undetectable. -At today's visit, we will recheck his thyroglobulin and thyroglobulin antibodies -I will see him back in 1 year  2. Patient with history of total thyroidectomy for thyroid cancer now with iatrogenic hypothyroidism on levothyroxine therapy - latest thyroid labs reviewed with pt. >> normal last month - he continues on LT4 125 mcg daily - pt feels good on this dose. - we discussed about taking the thyroid hormone every day, with water, >30 minutes before breakfast, separated by >4 hours from acid reflux medications, calcium, iron, multivitamins. Pt. is taking it correctly. -I refilled his levothyroxine  3.  Clinical osteoporosis -No fractures since last visit -In 2022, he had 2 vertebral fractures as a consequence of a fall from level ground, qualifying as fragility fractures.  This puts him into the osteoporotic category.  He had kyphoplasty and back pain improved afterwards. -He continues on vitamin D 5000 units daily with the latest level being normal in 05/2022. -We checked a bone density scan and this showed osteopenia, however, due to the presence of fragility fractures, he qualified for clinical  osteoporosis. -He has a history of radiation therapy, and therefore a contraindication to [employer] and [employer].  We ended up starting Fosamax 70 mg weekly in 2022.  He tolerates this well, without jaw/hip/thigh pain.  He takes it correctly, 30 minutes before levothyroxine, with a full glass of water. -Plan to continue Fosamax for 5 years but repeat a bone density scan 2 years from the previous -this is due after 08/2023.  Orders Placed This Encounter  Procedures   DG Bone Density   Vitamin D, 25-hydroxy   Thyroglobulin Level   Thyroglobulin antibody   Component     Latest Ref Rng 07/26/2023  VITD     30.00 - 100.00 ng/mL 42.01   T4,Free(Direct)     0.60 - 1.60 ng/dL 9.52   TSH     8.41 - 3.24 uIU/mL 1.87   Normal TFTs and vit D.   Component     Latest Ref Rng 07/26/2023  Thyroglobulin     ng/mL 0.2 (L)   Comment --   Thyroglobulin Ab     < or = 1 IU/mL <1   Stable thyroglobulin, negative antithyroglobulin antibodies.  Carlus Pavlov, MD PhD [facility] Endocrinology

## 2023-07-26 NOTE — Patient Instructions (Addendum)
Patient Instructions  Please continue Synthroid 125 mcg daily.  Take the thyroid hormone every day, with water, at least 30 minutes before breakfast, separated by at least 4 hours from: - acid reflux medications - calcium - iron - multivitamins  Please stop at the lab.  Continue vitamin D 2000 units daily.  Continue Fosamax 70 mg weekly.  Please call and schedule bone density scan at the [facility] Office: [phone].   Please come back for a follow-up appointment in 1 year.

## 2023-07-27 LAB — T4, FREE: Free T4: 1.19 ng/dL (ref 0.60–1.60)

## 2023-07-27 LAB — TSH: TSH: 1.87 u[IU]/mL (ref 0.35–5.50)

## 2023-07-27 LAB — VITAMIN D 25 HYDROXY (VIT D DEFICIENCY, FRACTURES): VITD: 42.01 ng/mL (ref 30.00–100.00)

## 2023-07-28 ENCOUNTER — Encounter: Payer: Self-pay | Admitting: Internal Medicine

## 2023-07-29 LAB — THYROGLOBULIN LEVEL: Thyroglobulin: 0.2 ng/mL — ABNORMAL LOW

## 2023-07-29 LAB — CUP PACEART REMOTE DEVICE CHECK
Date Time Interrogation Session: 20240828230625
Implantable Pulse Generator Implant Date: 20240514

## 2023-07-29 LAB — THYROGLOBULIN ANTIBODY: Thyroglobulin Ab: <1 [IU]/mL (ref <=1)

## 2023-08-03 ENCOUNTER — Ambulatory Visit (INDEPENDENT_AMBULATORY_CARE_PROVIDER_SITE_OTHER): Payer: Medicare HMO

## 2023-08-03 DIAGNOSIS — I639 Cerebral infarction, unspecified: Secondary | ICD-10-CM | POA: Diagnosis not present

## 2023-08-04 ENCOUNTER — Ambulatory Visit: Payer: Medicare HMO | Attending: Family Medicine | Admitting: Physical Therapy

## 2023-08-04 ENCOUNTER — Encounter: Payer: Self-pay | Admitting: Physical Therapy

## 2023-08-04 DIAGNOSIS — R2681 Unsteadiness on feet: Secondary | ICD-10-CM | POA: Diagnosis not present

## 2023-08-04 DIAGNOSIS — Z9181 History of falling: Secondary | ICD-10-CM | POA: Insufficient documentation

## 2023-08-04 DIAGNOSIS — R2689 Other abnormalities of gait and mobility: Secondary | ICD-10-CM | POA: Diagnosis not present

## 2023-08-04 DIAGNOSIS — R278 Other lack of coordination: Secondary | ICD-10-CM | POA: Diagnosis not present

## 2023-08-04 DIAGNOSIS — M6281 Muscle weakness (generalized): Secondary | ICD-10-CM | POA: Insufficient documentation

## 2023-08-04 NOTE — Therapy (Signed)
OUTPATIENT PHYSICAL THERAPY TREATMENT   Patient Name: Timothy Conway MRN: [id number] DOB:Dec 29, 1955, 67 y.o., male Today's Date: 08/04/2023   END OF SESSION:  PT End of Session - 08/04/23 0933     Visit Number 2    Date for PT Re-Evaluation 08/25/23    Authorization Type [employer]    PT Start Time [phone]    PT Stop Time 1016    PT Time Calculation (min) 45 min    Activity Tolerance Patient tolerated treatment well    Behavior During Therapy [facility] for tasks assessed/performed             Past Medical History:  Diagnosis Date   Adenocarcinoma, lung, left (HCC) 07/05/2017   Age-related osteoporosis without current pathological fracture 12/12/2021   Anxiety    COPD (chronic obstructive pulmonary disease) (HCC)    Depression    Dry eye syndrome    Dysrhythmia    hx of PAC's   Effusion of elbow joint, right 06/29/2017   Gastric ulcer with perforation but without obstruction (HCC) 04/15/2016   GERD (gastroesophageal reflux disease)    Gout    Hyperlipidemia    Hypertension    Hypothyroidism    OSA (obstructive sleep apnea) 02/18/2023   Papillary thyroid carcinoma s/p total thyroidectomy & CLND 08/27/2017 08/24/2017   Postsurgical hypothyroidism 10/01/2017   Thyroid cancer (HCC)    2018   Past Surgical History:  Procedure Laterality Date   ABDOMINAL SURGERY     hemmoraging ulcer   BRONCHIAL BIOPSY  06/25/2020   Procedure: BRONCHIAL BIOPSIES;  Surgeon: [name], MD;  Location: [facility] ENDOSCOPY;  Service: Pulmonary;;   BRONCHIAL BRUSHINGS  06/25/2020   Procedure: BRONCHIAL BRUSHINGS;  Surgeon: [name], MD;  Location: [facility] ENDOSCOPY;  Service: Pulmonary;;   BRONCHIAL NEEDLE ASPIRATION BIOPSY  06/25/2020   Procedure: BRONCHIAL NEEDLE ASPIRATION BIOPSIES;  Surgeon: [name], MD;  Location: [facility] ENDOSCOPY;  Service: Pulmonary;;   BRONCHIAL WASHINGS  06/25/2020   Procedure: BRONCHIAL WASHINGS;  Surgeon: [name], MD;  Location: [facility] ENDOSCOPY;  Service: Pulmonary;;    CATARACT EXTRACTION Right    COLONOSCOPY     EYE SURGERY Right    x 4  between ages 44-13   FIDUCIAL MARKER PLACEMENT  06/25/2020   Procedure: FIDUCIAL MARKER PLACEMENT;  Surgeon: [name], MD;  Location: [facility] ENDOSCOPY;  Service: Pulmonary;;   THYROIDECTOMY N/A 08/27/2017   Procedure: TOTAL THYROIDECTOMY WITH LIMITED LYMPH NODE DISSECTION;  Surgeon: [name], MD;  Location: [facility] ORS;  Service: General;  Laterality: N/A;   UPPER GI ENDOSCOPY     VIDEO BRONCHOSCOPY WITH ENDOBRONCHIAL NAVIGATION N/A 06/25/2020   Procedure: VIDEO BRONCHOSCOPY WITH ENDOBRONCHIAL NAVIGATION;  Surgeon: [name], MD;  Location: [facility] ENDOSCOPY;  Service: Pulmonary;  Laterality: N/A;   Patient Active Problem List   Diagnosis Date Noted   Sleep apnea-like behavior 04/08/2023   Excessive daytime sleepiness 02/18/2023   History of gastric ulcer 02/18/2023   HTN (hypertension), benign 02/18/2023   Hx of papillary thyroid carcinoma 02/18/2023   Hypercholesteremia 02/18/2023   Low back pain 02/18/2023   Major depression, single episode 02/18/2023   Morbid obesity (HCC) 02/18/2023   Nodule of left lung 02/18/2023   OSA (obstructive sleep apnea) 02/18/2023   Non-restorative sleep 02/18/2023   Penetrating ulcer of aorta (HCC) 02/18/2023   Protrusion of intervertebral disc of thoracic region 02/18/2023   Seborrheic dermatitis 02/18/2023   Vitamin D deficiency 02/18/2023   Late effect of cerebrovascular accident (  CVA) 01/05/2023   Dyslipidemia 01/05/2023   History of smoking 01/05/2023   Age-related osteoporosis without current pathological fracture 12/12/2021   Postsurgical hypothyroidism 10/01/2017   Papillary thyroid carcinoma s/p total thyroidectomy & CLND 08/27/2017 08/24/2017   Adenocarcinoma, lung, left (HCC) 07/05/2017   COPD (chronic obstructive pulmonary disease) (HCC) 07/05/2017   Effusion of elbow joint, right 06/29/2017   Gastric ulcer with perforation but without obstruction (HCC) 04/15/2016     PCP: [name], MD   REFERRING PROVIDER: [name], MD   REFERRING DIAG: R53.81 (ICD-10-CM) - Declining functional status   THERAPY DIAG:  Unsteadiness on feet  Other abnormalities of gait and mobility  Muscle weakness (generalized)  History of falling  Other lack of coordination  RATIONALE FOR EVALUATION AND TREATMENT: Rehabilitation  ONSET DATE: December 2023  NEXT MD VISIT: Unknown   SUBJECTIVE:                                                                                                                                                                                                         SUBJECTIVE STATEMENT: Timothy Conway reports no falls since IE, although he had a large LOB this morning but caught himself on the bookcase.    PAIN: Are you having pain? 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   PRECAUTIONS: Other: Implanted loop recorder & completely blind in his right eye    RED FLAGS: None  WEIGHT BEARING RESTRICTIONS: No  FALLS:  Has patient fallen in last 6 months? No and Yes. Number of falls at least 6 in April/May per documentation in recent PT episode, however patient denies any recent falls  LIVING ENVIRONMENT:  Lives with: lives alone Lives in: House/apartment Stairs: No Has following equipment at home: Single point cane, Environmental consultant - 2 wheeled, shower chair, and Grab bars  OCCUPATION: Retired  PLOF: Independent and Leisure: watching TV, mostly sedentary   PATIENT GOALS: "Strengthening my knees and  improve my balance."   OBJECTIVE: (objective measures completed at initial evaluation unless otherwise dated)  DIAGNOSTIC FINDINGS:  04/06/23 - CT angio head & neck: IMPRESSION: 1. No large vessel occlusion. 2. Severe stenosis of the right vertebral artery origin. 3.  Approximately 50% stenosis of the proximal left ICA in the neck. 4. Left upper lobe treatment change, incompletely imaged and not well assessed on this study. Please see prior CT chest. 5. Bulky bridging anterior osteophytes at multiple cervical levels.  04/09/23 - CT chest: IMPRESSION: 1. Unchanged post treatment/post radiation appearance of the anterior left upper lobe with irregular scarring and volume loss with biopsy marking clips and or fiducial markers. Most discrete residual nodule anteriorly measures 1.1 x 0.8 cm, unchanged. 2. No evidence of recurrent or metastatic disease in the chest. 3. Mildly coarse contour of the liver, suggestive of cirrhosis.  11/07/22 - MRI brain: IMPRESSION: 1. Positive for a small subacute lacunar infarct in the Right cerebellum. No associated hemorrhage or mass effect. And underlying chronic small vessel ischemic disease in the cerebral white matter and basal ganglia. 2. No metastatic disease identified. 3. Bulky degenerative anterior cervical spine spurring at C3-C4.  COGNITION: Overall cognitive status: Impaired - reports STM deficits   SENSATION: WFL  COORDINATION: Grossly WFL  MUSCLE LENGTH: Hamstrings: Mod tight B ITB: Mild/mod tight B Piriformis: Mod/severe tight B Hip flexors: Mod tight B Quads: Mod tight B Heelcord: NT  POSTURE:  rounded shoulders, forward head, and wide BOS  LOWER EXTREMITY ROM:    B hips limited due to muscle tightness with B knee extension limited by HS tightness and weakness Active  Right Eval Left Eval  Knee flexion 128 132  Knee extension -9 LAQ / 0 supine -28 LAQ / 0 supine    LOWER EXTREMITY MMT:    MMT Right Eval Left Eval  Hip  flexion 4 4  Hip extension 3+ 4-  Hip abduction 4- 4-  Hip adduction 4 4  Hip internal rotation 4 4  Hip external rotation 4 4  Knee flexion 4+ 5  Knee extension 4 4  Ankle dorsiflexion 4+ 5  Ankle plantarflexion    Ankle inversion    Ankle eversion    (Blank rows = not tested)  BED MOBILITY:  Grossly independent  TRANSFERS: Assistive device utilized: None  Sit to stand: Complete Independence Stand to sit: Complete Independence Chair to chair:  NT Floor:  NT  GAIT: Distance walked: Clinic distances Assistive device utilized: None Level of assistance: Complete Independence and SBA Gait pattern: step through pattern, decreased arm swing- Right, decreased arm swing- Left, shuffling, wide BOS, poor foot clearance- Right, and poor foot clearance- Left Comments: Occasional staggering due to decreased foot clearance  STAIRS:  Level of Assistance: SBA  Stair Negotiation Technique: Alternating Pattern  with Single Rail on Right  Number of Stairs: 14   Height of Stairs: 7"  Comments: Inconsistent foot clearance on step with heels dragging down edge of step on some steps on descent  FUNCTIONAL TESTS:  5 times sit to stand: 15.21 sec w/o UE assist Timed up and go (TUG): 10.47 sec w/o AD 10 meter walk test: 9.25 sec; Gait speed: 3.55 ft/sec  Berg Balance Scale: 49/56 Functional gait assessment: 18/30; < 19 = high risk fall   PATIENT SURVEYS:  ABC scale 1390 / 1600 = 86.9 %   TODAY'S TREATMENT:   08/04/2023 Therapeutic Exercise: to improve strength and mobility.  Demo, verbal and tactile cues throughout for technique. Nustep L6 x 6 min  Low Squats with table behind for safety x 5  At counter for safety with SBA: Heel/toe raises x 10 Hip extension x 10 each Hip abduction x 10 each Hip flexion x 10 each Therapeutic Activity:  Berg balance test to assess static balance 49/56 Neuromuscular Reeducation: to improve balance and stability. SBA for safety throughout.  Corner  balance exercises: Tandem stance x 30 sec each side Eyes open feet together x 30 sec  Eyes open feet together with head nods x 10 Eyes open feet together with head turns x 10 Eyes closed feet together x 30 sec Eyes closed feet together with head nods x 10 Eyes closed feet together with head turns x 10   07/14/23 Eval only   PATIENT EDUCATION:  Education details: initial HEP  Person educated: Patient Education method: Programmer, multimedia, Facilities manager, Verbal cues, and Handouts Education comprehension: verbalized understanding and returned demonstration  HOME EXERCISE PROGRAM: Access Code: [email or web address] URL: [email or web address] Date: 08/04/2023 Prepared by: Harrie Foreman  Exercises - Squat with Chair Touch  - 1 x daily - 7 x weekly - 2 sets - 10 reps - Heel Toe Raises with Counter Support  - 1 x daily - 7 x weekly - 2 sets - 10 reps - Standing Hip Extension with Counter Support  - 1 x daily - 7 x weekly - 3 sets - 10 reps - Standing Hip Abduction with Counter Support  - 1 x daily - 7 x weekly - 3 sets - 10 reps - Standing Hip Flexion with Counter Support  - 1 x daily - 7 x weekly - 3 sets - 10 reps - Tandem Stance in Corner  - 1 x daily - 7 x weekly - 3 sets - 10 reps - Corner Balance   - 1 x daily - 7 x weekly - 3 sets - 10 reps   ASSESSMENT:  CLINICAL IMPRESSION: Timothy Conway completed BERG today, scoring 49/56, which is same as what he score in April 2024.  Noted unable to maintain tandem stance for more than 10 sec and SLS for more than 5 seconds.  Reviewed and updated previous HEP, starting with progressing sit to stands and squats to low squats in front of low recliner at home since it is too low to do sit to stands from and his bed is too easy now for sit to stands, but doing it in front of the recliner will provide safety.  Counter exercises to be done in front of sink to provide grip for safety, and also added standing corner balance exercises, added head  nods both with eyes open and closed as he reports losing balance in shower when washing hair.   Corner balance exercises to be performed in corner with chair in front only if he feels he can perform safely at home.   He does have shower chair, recommended using at all times in shower.  Timothy Conway continues to demonstrate potential for improvement and would benefit from continued skilled therapy to address impairments.      OBJECTIVE IMPAIRMENTS: Abnormal gait, decreased activity tolerance, decreased balance, decreased coordination, decreased endurance, decreased knowledge of condition, decreased knowledge of use of DME, decreased mobility, difficulty walking, decreased ROM, decreased strength, decreased safety awareness, impaired flexibility, improper body mechanics, postural dysfunction, and pain.   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   REHAB POTENTIAL: Good  CLINICAL DECISION MAKING: Stable/uncomplicated  EVALUATION COMPLEXITY: Low   GOALS: Goals reviewed with patient? Yes  SHORT TERM GOALS: Target date: 08/04/2023  Patient will be independent with initial HEP to improve outcomes and carryover.  Baseline: given 08/04/23 Goal status: IN PROGRESS  2.  Patient will be educated on strategies to decrease risk of falls.  Baseline:  Goal status: INITIAL  3.  Patient will improve 5x STS time to </= 12 seconds for improved efficiency and safety with  transfers. Baseline: 15.21 sec w/o UE assist Goal status: IN PROGRESS  LONG TERM GOALS: Target date: 08/25/2023  Patient will be independent with advanced/ongoing HEP to facilitate ability to maintain/progress functional gains from skilled physical therapy services. Baseline:  Goal status: IN PROGRESS  2.  Patient will be able to ambulate without gait deviations or staggering with or w/o LRAD on variable surfaces with good safety to access community.  Baseline: Inconsistent foot clearance leading to occasional stagger Goal status: IN PROGRESS  3.  Patient will be able to step up/down curb safely with LRAD for safety with community ambulation.  Baseline:  Goal status: IN PROGRESS   4.  Patient will demonstrate improved B LE strength to >/= 4+/5 for improved stability and ease of mobility . Baseline: Refer to above MMT table Goal status: IN PROGRESS  5.  Patient will improve Berg score by >/= 8 points or to >/= 52/56 to improve safety and stability with ADLs in standing and reduce risk for falls. (MCID= 8 points)  Baseline: 49/56 Goal status: IN PROGRESS  6. Patient will improve FGA score to at least 22/30 to improve gait stability and reduce risk for falls. Baseline: 18/30 Goal status: IN PROGRESS  7.  Patient will report >/= 90% on ABC scale to demonstrate improved functional ability. Baseline: 1390 / 1600 = 86.9 % Goal status: IN PROGRESS   PLAN:  PT FREQUENCY: 1-2x/week  PT DURATION: 6 weeks  PLANNED INTERVENTIONS: Therapeutic exercises, Therapeutic activity, Neuromuscular re-education, Balance training, Gait training, Patient/Family education, Self Care, Joint mobilization, Stair training, DME instructions, Cryotherapy, Moist heat, Taping, Manual therapy, and Re-evaluation  PLAN FOR NEXT SESSION: Review fall prevention/fall safety checklist; lower extremity flexibility stretching; core/lumbopelvic and LE strengthening; balance training; dynamic gait  training   Jena Gauss, PT, DPT 08/04/2023, 1:39 PM

## 2023-08-05 DIAGNOSIS — J449 Chronic obstructive pulmonary disease, unspecified: Secondary | ICD-10-CM | POA: Diagnosis not present

## 2023-08-05 DIAGNOSIS — I10 Essential (primary) hypertension: Secondary | ICD-10-CM | POA: Diagnosis not present

## 2023-08-05 DIAGNOSIS — M6281 Muscle weakness (generalized): Secondary | ICD-10-CM | POA: Diagnosis not present

## 2023-08-06 ENCOUNTER — Ambulatory Visit: Payer: Medicare HMO

## 2023-08-06 DIAGNOSIS — Z9181 History of falling: Secondary | ICD-10-CM | POA: Diagnosis not present

## 2023-08-06 DIAGNOSIS — M6281 Muscle weakness (generalized): Secondary | ICD-10-CM | POA: Diagnosis not present

## 2023-08-06 DIAGNOSIS — R2689 Other abnormalities of gait and mobility: Secondary | ICD-10-CM

## 2023-08-06 DIAGNOSIS — R2681 Unsteadiness on feet: Secondary | ICD-10-CM

## 2023-08-06 DIAGNOSIS — R278 Other lack of coordination: Secondary | ICD-10-CM

## 2023-08-06 NOTE — Therapy (Signed)
OUTPATIENT PHYSICAL THERAPY TREATMENT   Patient Name: Timothy Conway MRN: [id number] DOB:1956-10-08, 67 y.o., male Today's Date: 08/06/2023   END OF SESSION:  PT End of Session - 08/06/23 1015     Visit Number 3    Date for PT Re-Evaluation 08/25/23    Authorization Type [employer]    PT Start Time [phone]    PT Stop Time 1015    PT Time Calculation (min) 41 min    Activity Tolerance Patient tolerated treatment well    Behavior During Therapy [facility] for tasks assessed/performed              Past Medical History:  Diagnosis Date   Adenocarcinoma, lung, left (HCC) 07/05/2017   Age-related osteoporosis without current pathological fracture 12/12/2021   Anxiety    COPD (chronic obstructive pulmonary disease) (HCC)    Depression    Dry eye syndrome    Dysrhythmia    hx of PAC's   Effusion of elbow joint, right 06/29/2017   Gastric ulcer with perforation but without obstruction (HCC) 04/15/2016   GERD (gastroesophageal reflux disease)    Gout    Hyperlipidemia    Hypertension    Hypothyroidism    OSA (obstructive sleep apnea) 02/18/2023   Papillary thyroid carcinoma s/p total thyroidectomy & CLND 08/27/2017 08/24/2017   Postsurgical hypothyroidism 10/01/2017   Thyroid cancer (HCC)    2018   Past Surgical History:  Procedure Laterality Date   ABDOMINAL SURGERY     hemmoraging ulcer   BRONCHIAL BIOPSY  06/25/2020   Procedure: BRONCHIAL BIOPSIES;  Surgeon: [name], MD;  Location: [facility] ENDOSCOPY;  Service: Pulmonary;;   BRONCHIAL BRUSHINGS  06/25/2020   Procedure: BRONCHIAL BRUSHINGS;  Surgeon: [name], MD;  Location: [facility] ENDOSCOPY;  Service: Pulmonary;;   BRONCHIAL NEEDLE ASPIRATION BIOPSY  06/25/2020   Procedure: BRONCHIAL NEEDLE ASPIRATION BIOPSIES;  Surgeon: [name], MD;  Location: [facility] ENDOSCOPY;  Service: Pulmonary;;   BRONCHIAL WASHINGS  06/25/2020   Procedure: BRONCHIAL WASHINGS;  Surgeon: [name], MD;  Location: [facility] ENDOSCOPY;  Service: Pulmonary;;    CATARACT EXTRACTION Right    COLONOSCOPY     EYE SURGERY Right    x 4  between ages 19-13   FIDUCIAL MARKER PLACEMENT  06/25/2020   Procedure: FIDUCIAL MARKER PLACEMENT;  Surgeon: [name], MD;  Location: [facility] ENDOSCOPY;  Service: Pulmonary;;   THYROIDECTOMY N/A 08/27/2017   Procedure: TOTAL THYROIDECTOMY WITH LIMITED LYMPH NODE DISSECTION;  Surgeon: [name], MD;  Location: [facility] ORS;  Service: General;  Laterality: N/A;   UPPER GI ENDOSCOPY     VIDEO BRONCHOSCOPY WITH ENDOBRONCHIAL NAVIGATION N/A 06/25/2020   Procedure: VIDEO BRONCHOSCOPY WITH ENDOBRONCHIAL NAVIGATION;  Surgeon: [name], MD;  Location: [facility] ENDOSCOPY;  Service: Pulmonary;  Laterality: N/A;   Patient Active Problem List   Diagnosis Date Noted   Sleep apnea-like behavior 04/08/2023   Excessive daytime sleepiness 02/18/2023   History of gastric ulcer 02/18/2023   HTN (hypertension), benign 02/18/2023   Hx of papillary thyroid carcinoma 02/18/2023   Hypercholesteremia 02/18/2023   Low back pain 02/18/2023   Major depression, single episode 02/18/2023   Morbid obesity (HCC) 02/18/2023   Nodule of left lung 02/18/2023   OSA (obstructive sleep apnea) 02/18/2023   Non-restorative sleep 02/18/2023   Penetrating ulcer of aorta (HCC) 02/18/2023   Protrusion of intervertebral disc of thoracic region 02/18/2023   Seborrheic dermatitis 02/18/2023   Vitamin D deficiency 02/18/2023   Late effect of cerebrovascular  accident (CVA) 01/05/2023   Dyslipidemia 01/05/2023   History of smoking 01/05/2023   Age-related osteoporosis without current pathological fracture 12/12/2021   Postsurgical hypothyroidism 10/01/2017   Papillary thyroid carcinoma s/p total thyroidectomy & CLND 08/27/2017 08/24/2017   Adenocarcinoma, lung, left (HCC) 07/05/2017   COPD (chronic obstructive pulmonary disease) (HCC) 07/05/2017   Effusion of elbow joint, right 06/29/2017   Gastric ulcer with perforation but without obstruction (HCC) 04/15/2016     PCP: [name], MD   REFERRING PROVIDER: [name], MD   REFERRING DIAG: R53.81 (ICD-10-CM) - Declining functional status   THERAPY DIAG:  Unsteadiness on feet  Other abnormalities of gait and mobility  Muscle weakness (generalized)  History of falling  Other lack of coordination  RATIONALE FOR EVALUATION AND TREATMENT: Rehabilitation  ONSET DATE: December 2023  NEXT MD VISIT: Unknown   SUBJECTIVE:                                                                                                                                                                                                         SUBJECTIVE STATEMENT: [name] reports no falls since IE, although he had a large LOB this morning but caught himself on the bookcase.    PAIN: Are you having pain? 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   PRECAUTIONS: Other: Implanted loop recorder & completely blind in his right eye    RED FLAGS: None  WEIGHT BEARING RESTRICTIONS: No  FALLS:  Has patient fallen in last 6 months? No and Yes. Number of falls at least 6 in April/May per documentation in recent PT episode, however patient denies any recent falls  LIVING ENVIRONMENT:  Lives with: lives alone Lives in: House/apartment Stairs: No Has following equipment at home: Single point cane, Environmental consultant - 2 wheeled, shower chair, and Grab bars  OCCUPATION: Retired  PLOF: Independent and Leisure: watching TV, mostly sedentary   PATIENT GOALS: "Strengthening my knees and  improve my balance."   OBJECTIVE: (objective measures completed at initial evaluation unless otherwise dated)  DIAGNOSTIC FINDINGS:  04/06/23 - CT angio head & neck: IMPRESSION: 1. No large vessel occlusion. 2. Severe stenosis of the right vertebral artery origin.  3. Approximately 50% stenosis of the proximal left ICA in the neck. 4. Left upper lobe treatment change, incompletely imaged and not well assessed on this study. Please see prior CT chest. 5. Bulky bridging anterior osteophytes at multiple cervical levels.  04/09/23 - CT chest: IMPRESSION: 1. Unchanged post treatment/post radiation appearance of the anterior left upper lobe with irregular scarring and volume loss with biopsy marking clips and or fiducial markers. Most discrete residual nodule anteriorly measures 1.1 x 0.8 cm, unchanged. 2. No evidence of recurrent or metastatic disease in the chest. 3. Mildly coarse contour of the liver, suggestive of cirrhosis.  11/07/22 - MRI brain: IMPRESSION: 1. Positive for a small subacute lacunar infarct in the Right cerebellum. No associated hemorrhage or mass effect. And underlying chronic small vessel ischemic disease in the cerebral white matter and basal ganglia. 2. No metastatic disease identified. 3. Bulky degenerative anterior cervical spine spurring at C3-C4.  COGNITION: Overall cognitive status: Impaired - reports STM deficits   SENSATION: WFL  COORDINATION: Grossly WFL  MUSCLE LENGTH: Hamstrings: Mod tight B ITB: Mild/mod tight B Piriformis: Mod/severe tight B Hip flexors: Mod tight B Quads: Mod tight B Heelcord: NT  POSTURE:  rounded shoulders, forward head, and wide BOS  LOWER EXTREMITY ROM:    B hips limited due to muscle tightness with B knee extension limited by HS tightness and weakness Active  Right Eval Left Eval  Knee flexion 128 132  Knee extension -9 LAQ / 0 supine -28 LAQ / 0 supine    LOWER EXTREMITY MMT:    MMT Right Eval Left Eval  Hip  flexion 4 4  Hip extension 3+ 4-  Hip abduction 4- 4-  Hip adduction 4 4  Hip internal rotation 4 4  Hip external rotation 4 4  Knee flexion 4+ 5  Knee extension 4 4  Ankle dorsiflexion 4+ 5  Ankle plantarflexion    Ankle inversion    Ankle eversion    (Blank rows = not tested)  BED MOBILITY:  Grossly independent  TRANSFERS: Assistive device utilized: None  Sit to stand: Complete Independence Stand to sit: Complete Independence Chair to chair:  NT Floor:  NT  GAIT: Distance walked: Clinic distances Assistive device utilized: None Level of assistance: Complete Independence and SBA Gait pattern: step through pattern, decreased arm swing- Right, decreased arm swing- Left, shuffling, wide BOS, poor foot clearance- Right, and poor foot clearance- Left Comments: Occasional staggering due to decreased foot clearance  STAIRS:  Level of Assistance: SBA  Stair Negotiation Technique: Alternating Pattern  with Single Rail on Right  Number of Stairs: 14   Height of Stairs: 7"  Comments: Inconsistent foot clearance on step with heels dragging down edge of step on some steps on descent  FUNCTIONAL TESTS:  5 times sit to stand: 15.21 sec w/o UE assist Timed up and go (TUG): 10.47 sec w/o AD 10 meter walk test: 9.25 sec; Gait speed: 3.55 ft/sec  Berg Balance Scale: 49/56 Functional gait assessment: 18/30; < 19 = high risk fall   PATIENT SURVEYS:  ABC scale 1390 / 1600 = 86.9 %   TODAY'S TREATMENT:  08/06/23 Therapeutic Exercise: to improve strength and mobility.  Demo, verbal and tactile cues throughout for technique. Nustep L6 x 6 min  Standing lumbar extension 10x Heel/toe raises 2x10 each Wall slides in runner stretch position x 5 switching LE after 5 Lumbar extension at wall 10x Church pews x 15 Retro steps x 10  Exercises seated with yellow  weight ball -Side to side 2x10 -chest press 2x10  08/04/2023 Therapeutic Exercise: to improve strength and mobility.  Demo, verbal  and tactile cues throughout for technique. Nustep L6 x 6 min  Low Squats with table behind for safety x 5  At counter for safety with SBA: Heel/toe raises x 10 Hip extension x 10 each Hip abduction x 10 each Hip flexion x 10 each Therapeutic Activity:   Berg balance test to assess static balance 49/56 Neuromuscular Reeducation: to improve balance and stability. SBA for safety throughout.  Corner balance exercises: Tandem stance x 30 sec each side Eyes open feet together x 30 sec  Eyes open feet together with head nods x 10 Eyes open feet together with head turns x 10 Eyes closed feet together x 30 sec Eyes closed feet together with head nods x 10 Eyes closed feet together with head turns x 10   07/14/23 Eval only   PATIENT EDUCATION:  Education details: initial HEP  Person educated: Patient Education method: Programmer, multimedia, Facilities manager, Verbal cues, and Handouts Education comprehension: verbalized understanding and returned demonstration  HOME EXERCISE PROGRAM: Access Code: [email or web address] URL: [email or web address] Date: 08/04/2023 Prepared by: Harrie Foreman  Exercises - Squat with Chair Touch  - 1 x daily - 7 x weekly - 2 sets - 10 reps - Heel Toe Raises with Counter Support  - 1 x daily - 7 x weekly - 2 sets - 10 reps - Standing Hip Extension with Counter Support  - 1 x daily - 7 x weekly - 3 sets - 10 reps - Standing Hip Abduction with Counter Support  - 1 x daily - 7 x weekly - 3 sets - 10 reps - Standing Hip Flexion with Counter Support  - 1 x daily - 7 x weekly - 3 sets - 10 reps - Tandem Stance in Corner  - 1 x daily - 7 x weekly - 3 sets - 10 reps - Corner Balance   - 1 x daily - 7 x weekly - 3 sets - 10 reps   ASSESSMENT:  CLINICAL IMPRESSION:  Focused more on mobility and core activation today to improve stability and functional mobility. He utilized the sink for balance with most of the exercises. Required cues to avoid leaning forward with the  church pews and retro steps. Briefly review fall prevention check list with him as well. Jewelz Isip Smoker continues to demonstrate potential for improvement and would benefit from continued skilled therapy to address impairments.      OBJECTIVE IMPAIRMENTS: Abnormal gait, decreased activity tolerance, decreased balance, decreased coordination, decreased endurance, decreased knowledge of condition, decreased knowledge of use of DME, decreased mobility, difficulty walking, decreased ROM, decreased strength, decreased safety awareness, impaired flexibility, improper body mechanics, postural dysfunction, and pain.   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   REHAB POTENTIAL: Good  CLINICAL DECISION MAKING: Stable/uncomplicated  EVALUATION COMPLEXITY: Low   GOALS: Goals reviewed with patient? Yes  SHORT TERM GOALS: Target date: 08/04/2023  Patient will be independent with initial HEP to improve outcomes and carryover.  Baseline: given 08/04/23 Goal status:  IN PROGRESS  2.  Patient will be educated on strategies to decrease risk of falls.  Baseline:  Goal status: MET- 08/06/23  3.  Patient will improve 5x STS time to </= 12 seconds for improved efficiency and safety with transfers. Baseline: 15.21 sec w/o UE assist Goal status: IN PROGRESS  LONG TERM GOALS: Target date: 08/25/2023  Patient will be independent with advanced/ongoing HEP to facilitate ability to  maintain/progress functional gains from skilled physical therapy services. Baseline:  Goal status: IN PROGRESS  2.  Patient will be able to ambulate without gait deviations or staggering with or w/o LRAD on variable surfaces with good safety to access community.  Baseline: Inconsistent foot clearance leading to occasional stagger Goal status: IN PROGRESS  3.  Patient will be able to step up/down curb safely with LRAD for safety with community ambulation.  Baseline:  Goal status: IN PROGRESS   4.  Patient will demonstrate improved B LE strength to >/= 4+/5 for improved stability and ease of mobility . Baseline: Refer to above MMT table Goal status: IN PROGRESS  5.  Patient will improve Berg score by >/= 8 points or to >/= 52/56 to improve safety and stability with ADLs in standing and reduce risk for falls. (MCID= 8 points)  Baseline: 49/56 Goal status: IN PROGRESS  6. Patient will improve FGA score to at least 22/30 to improve gait stability and reduce risk for falls. Baseline: 18/30 Goal status: IN PROGRESS  7.  Patient will report >/= 90% on ABC scale to demonstrate improved functional ability. Baseline: 1390 / 1600 = 86.9 % Goal status: IN PROGRESS   PLAN:  PT FREQUENCY: 1-2x/week  PT DURATION: 6 weeks  PLANNED INTERVENTIONS: Therapeutic exercises, Therapeutic activity, Neuromuscular re-education, Balance training, Gait training, Patient/Family education, Self Care, Joint mobilization, Stair training, DME instructions, Cryotherapy, Moist heat, Taping, Manual therapy, and Re-evaluation  PLAN FOR NEXT SESSION:  lower extremity flexibility stretching; core/lumbopelvic and LE strengthening; balance training; dynamic gait training   Darleene Cleaver, PTA 08/06/2023, 10:17 AM

## 2023-08-09 ENCOUNTER — Ambulatory Visit: Payer: Medicare HMO | Admitting: Adult Health

## 2023-08-10 ENCOUNTER — Ambulatory Visit: Payer: Medicare HMO | Admitting: Physical Therapy

## 2023-08-10 ENCOUNTER — Encounter: Payer: Self-pay | Admitting: Physical Therapy

## 2023-08-10 DIAGNOSIS — R2681 Unsteadiness on feet: Secondary | ICD-10-CM

## 2023-08-10 DIAGNOSIS — M6281 Muscle weakness (generalized): Secondary | ICD-10-CM | POA: Diagnosis not present

## 2023-08-10 DIAGNOSIS — R2689 Other abnormalities of gait and mobility: Secondary | ICD-10-CM

## 2023-08-10 DIAGNOSIS — Z9181 History of falling: Secondary | ICD-10-CM | POA: Diagnosis not present

## 2023-08-10 DIAGNOSIS — R278 Other lack of coordination: Secondary | ICD-10-CM | POA: Diagnosis not present

## 2023-08-10 NOTE — Therapy (Signed)
OUTPATIENT PHYSICAL THERAPY TREATMENT   Patient Name: Timothy Conway MRN: [id number] DOB:01-15-1956, 67 y.o., male Today's Date: 08/10/2023   END OF SESSION:  PT End of Session - 08/10/23 0932     Visit Number 4    Date for PT Re-Evaluation 08/25/23    Authorization Type [employer]    Authorization Time Period 07/14/23 - 08/25/23    Authorization - Visit Number 3    Authorization - Number of Visits 10    PT Start Time 0932    PT Stop Time 1013    PT Time Calculation (min) 41 min    Activity Tolerance Patient tolerated treatment well    Behavior During Therapy [facility] for tasks assessed/performed               Past Medical History:  Diagnosis Date   Adenocarcinoma, lung, left (HCC) 07/05/2017   Age-related osteoporosis without current pathological fracture 12/12/2021   Anxiety    COPD (chronic obstructive pulmonary disease) (HCC)    Depression    Dry eye syndrome    Dysrhythmia    hx of PAC's   Effusion of elbow joint, right 06/29/2017   Gastric ulcer with perforation but without obstruction (HCC) 04/15/2016   GERD (gastroesophageal reflux disease)    Gout    Hyperlipidemia    Hypertension    Hypothyroidism    OSA (obstructive sleep apnea) 02/18/2023   Papillary thyroid carcinoma s/p total thyroidectomy & CLND 08/27/2017 08/24/2017   Postsurgical hypothyroidism 10/01/2017   Thyroid cancer (HCC)    2018   Past Surgical History:  Procedure Laterality Date   ABDOMINAL SURGERY     hemmoraging ulcer   BRONCHIAL BIOPSY  06/25/2020   Procedure: BRONCHIAL BIOPSIES;  Surgeon: [name], MD;  Location: [facility] ENDOSCOPY;  Service: Pulmonary;;   BRONCHIAL BRUSHINGS  06/25/2020   Procedure: BRONCHIAL BRUSHINGS;  Surgeon: [name], MD;  Location: [facility]COPY;  Service: Pulmonary;;   BRONCHIAL NEEDLE ASPIRATION BIOPSY  06/25/2020   Procedure: BRONCHIAL NEEDLE ASPIRATION BIOPSIES;  Surgeon: [name], MD;  Location: [facility] ENDOSCOPY;  Service: Pulmonary;;   BRONCHIAL WASHINGS   06/25/2020   Procedure: BRONCHIAL WASHINGS;  Surgeon: [name], MD;  Location: [facility] ENDOSCOPY;  Service: Pulmonary;;   CATARACT EXTRACTION Right    COLONOSCOPY     EYE SURGERY Right    x 4  between ages 74-13   FIDUCIAL MARKER PLACEMENT  06/25/2020   Procedure: FIDUCIAL MARKER PLACEMENT;  Surgeon: [name], MD;  Location: [facility] ENDOSCOPY;  Service: Pulmonary;;   THYROIDECTOMY N/A 08/27/2017   Procedure: TOTAL THYROIDECTOMY WITH LIMITED LYMPH NODE DISSECTION;  Surgeon: [name], MD;  Location: [facility] ORS;  Service: General;  Laterality: N/A;   UPPER GI ENDOSCOPY     VIDEO BRONCHOSCOPY WITH ENDOBRONCHIAL NAVIGATION N/A 06/25/2020   Procedure: VIDEO BRONCHOSCOPY WITH ENDOBRONCHIAL NAVIGATION;  Surgeon: [name], MD;  Location: [facility] ENDOSCOPY;  Service: Pulmonary;  Laterality: N/A;   Patient Active Problem List   Diagnosis Date Noted   Sleep apnea-like behavior 04/08/2023   Excessive daytime sleepiness 02/18/2023   History of gastric ulcer 02/18/2023   HTN (hypertension), benign 02/18/2023   Hx of papillary thyroid carcinoma 02/18/2023   Hypercholesteremia 02/18/2023   Low back pain 02/18/2023   Major depression, single episode 02/18/2023   Morbid obesity (HCC) 02/18/2023   Nodule of left lung 02/18/2023   OSA (obstructive sleep apnea) 02/18/2023   Non-restorative sleep 02/18/2023   Penetrating ulcer of aorta (HCC) 02/18/2023  Protrusion of intervertebral disc of thoracic region 02/18/2023   Seborrheic dermatitis 02/18/2023   Vitamin D deficiency 02/18/2023   Late effect of cerebrovascular accident (CVA) 01/05/2023   Dyslipidemia 01/05/2023   History of smoking 01/05/2023   Age-related osteoporosis without current pathological fracture 12/12/2021   Postsurgical hypothyroidism 10/01/2017   Papillary thyroid carcinoma s/p total thyroidectomy & CLND 08/27/2017 08/24/2017   Adenocarcinoma, lung, left (HCC) 07/05/2017   COPD (chronic obstructive pulmonary disease) (HCC)  07/05/2017   Effusion of elbow joint, right 06/29/2017   Gastric ulcer with perforation but without obstruction (HCC) 04/15/2016    PCP: Timothy Bussing, MD   REFERRING PROVIDER: [name], MD   REFERRING DIAG: R53.81 (ICD-10-CM) - Declining functional status   THERAPY DIAG:  Unsteadiness on feet  Other abnormalities of gait and mobility  Muscle weakness (generalized)  History of falling  RATIONALE FOR EVALUATION AND TREATMENT: Rehabilitation  ONSET DATE: December 2023  NEXT MD VISIT: Unknown   SUBJECTIVE:                                                                                                                                                                                                         SUBJECTIVE STATEMENT: [name] reports his hips are aching more today w/o known trigger.  PAIN: Are you having pain? 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   PRECAUTIONS: Other: Implanted loop recorder & completely blind in his right eye    RED FLAGS: None  WEIGHT BEARING RESTRICTIONS: No  FALLS:  Has patient fallen in last 6 months? No and Yes. Number of falls at least 6 in April/May per documentation in recent PT episode, however patient denies any recent falls  LIVING ENVIRONMENT:  Lives with: lives alone Lives in: House/apartment Stairs: No Has following equipment at home: Single point cane, Environmental consultant - 2 wheeled, shower chair, and Grab bars  OCCUPATION: Retired  PLOF: Independent and Leisure: watching TV, mostly sedentary   PATIENT GOALS: "Strengthening my knees and  improve my balance."   OBJECTIVE: (objective measures completed at initial evaluation unless otherwise dated)  DIAGNOSTIC FINDINGS:  04/06/23 - CT angio head & neck: IMPRESSION: 1. No large vessel occlusion. 2. Severe stenosis of the right vertebral artery origin.  3. Approximately 50% stenosis of the proximal left ICA in the neck. 4. Left upper lobe treatment change, incompletely imaged and not well assessed on this study. Please see prior CT chest. 5. Bulky bridging anterior osteophytes at multiple cervical levels.  04/09/23 - CT chest: IMPRESSION: 1. Unchanged post treatment/post radiation appearance of the anterior left upper lobe with irregular scarring and volume loss with biopsy marking clips and or fiducial markers. Most discrete residual nodule anteriorly measures 1.1 x 0.8 cm, unchanged. 2. No evidence of recurrent or metastatic disease in the chest. 3. Mildly coarse contour of the liver, suggestive of cirrhosis.  11/07/22 - MRI brain: IMPRESSION: 1. Positive for a small subacute lacunar infarct in the Right cerebellum. No associated hemorrhage or mass effect. And underlying chronic small vessel ischemic disease in the cerebral white matter and basal ganglia. 2. No metastatic disease identified. 3. Bulky degenerative anterior cervical spine spurring at C3-C4.  COGNITION: Overall cognitive status: Impaired - reports STM deficits   SENSATION: WFL  COORDINATION: Grossly WFL  MUSCLE LENGTH: Hamstrings: Mod tight B ITB: Mild/mod tight B Piriformis: Mod/severe tight B Hip flexors: Mod tight B Quads: Mod tight B Heelcord: NT  POSTURE:  rounded shoulders, forward head, and wide BOS  LOWER EXTREMITY ROM:    B hips limited due to muscle tightness with B knee extension limited by HS tightness and weakness Active  Right Eval Left Eval  Knee flexion 128 132  Knee extension -9 LAQ / 0 supine -28 LAQ / 0 supine    LOWER EXTREMITY MMT:    MMT Right Eval Left Eval  Hip  flexion 4 4  Hip extension 3+ 4-  Hip abduction 4- 4-  Hip adduction 4 4  Hip internal rotation 4 4  Hip external rotation 4 4  Knee flexion 4+ 5  Knee extension 4 4  Ankle dorsiflexion 4+ 5  Ankle plantarflexion    Ankle inversion    Ankle eversion    (Blank rows = not tested)  BED MOBILITY:  Grossly independent  TRANSFERS: Assistive device utilized: None  Sit to stand: Complete Independence Stand to sit: Complete Independence Chair to chair:  NT Floor:  NT  GAIT: Distance walked: Clinic distances Assistive device utilized: None Level of assistance: Complete Independence and SBA Gait pattern: step through pattern, decreased arm swing- Right, decreased arm swing- Left, shuffling, wide BOS, poor foot clearance- Right, and poor foot clearance- Left Comments: Occasional staggering due to decreased foot clearance  STAIRS:  Level of Assistance: SBA  Stair Negotiation Technique: Alternating Pattern  with Single Rail on Right  Number of Stairs: 14   Height of Stairs: 7"  Comments: Inconsistent foot clearance on step with heels dragging down edge of step on some steps on descent  FUNCTIONAL TESTS:  5 times sit to stand: 15.21 sec w/o UE assist Timed up and go (TUG): 10.47 sec w/o AD 10 meter walk test: 9.25 sec; Gait speed: 3.55 ft/sec  Berg Balance Scale: 49/56 Functional gait assessment: 18/30; < 19 = high risk fall   PATIENT SURVEYS:  ABC scale 1390 / 1600 = 86.9 %   TODAY'S TREATMENT:   08/10/23 THERAPEUTIC EXERCISE: to improve flexibility, strength and mobility.  Demonstration, verbal and tactile cues throughout for technique.  NuStep - L6 x 6 min (UE/LE) At counter for safety with SBA: Hip extension with looped RTB at ankles x 10 each bil, with looped RTB at knees x 5 each bil Hip abduction with looped RTB at ankles x  10 each bil, with looped RTB at knees x 5 each bil Hip flexion SLR with looped RTB at ankles x 10 each bil, march with looped RTB at knees x 5 each  bil B side-stepping with looped RTB at distal thighs 2 x 10 ft  Fwd monster walk 4 x 10 ft and backward monster walk 2 x 10 ft along counter with looped RTB at distal thighs  NEUROMUSCULAR RE-EDUCATION: To improve balance, proprioception, coordination, and reduce fall risk.  SBA/CGA of PT for safety. Corner balance progression with 1/2 tandem stance on firm surface with arms crossed on chest            Eyes open: - static stance x 30 sec - horiz head turns x 10 - vertical head nods x 10 - trunk rotation x 10 Eyes closed: - static stance x 30 sec - horiz head turns x 10 - vertical head nods x 10 - trunk rotation x 10 Corner balance progression with narrow BOS on Airex pad with arms crossed on chest            Eyes open: - static stance x 30 sec - horiz head turns x 5 - vertical head nods x 5 - trunk rotation x 5 Eyes closed: - static stance x 30 sec - horiz head turns x 5 - vertical head nods x 2 - fwd LOB - trunk rotation x 2 - L lateral LOB   08/06/23 Therapeutic Exercise: to improve strength and mobility.  Demo, verbal and tactile cues throughout for technique. Nustep L6 x 6 min  Standing lumbar extension 10x Heel/toe raises 2x10 each Wall slides in runner stretch position x 5 switching LE after 5 Lumbar extension at wall 10x [employer] pews x 15 Retro steps x 10  Exercises seated with yellow weight ball -Side to side 2x10 -chest press 2x10    08/04/2023  Therapeutic Exercise: to improve strength and mobility.  Demo, verbal and tactile cues throughout for technique. Nustep L6 x 6 min  Low Squats with table behind for safety x 5  At counter for safety with SBA: Heel/toe raises x 10 Hip extension x 10 each Hip abduction x 10 each Hip flexion x 10 each Therapeutic Activity:   Berg balance test to assess static balance 49/56 Neuromuscular Reeducation: to improve balance and stability. SBA for safety throughout.  Corner balance exercises: Tandem stance x 30 sec each  side Eyes open feet together x 30 sec  Eyes open feet together with head nods x 10 Eyes open feet together with head turns x 10 Eyes closed feet together x 30 sec Eyes closed feet together with head nods x 10 Eyes closed feet together with head turns x 10    PATIENT EDUCATION:  Education details: HEP review and HEP progression - RTB added at knee with 3-way hip SLR in standing   Person educated: Patient Education method: Explanation, Demonstration, and Verbal cues Education comprehension: verbalized understanding and returned demonstration  HOME EXERCISE PROGRAM: Access Code: [email or web address] URL: [email or web address] Date: 08/04/2023 Prepared by: Harrie Foreman  Exercises - Squat with Chair Touch  - 1 x daily - 7 x weekly - 2 sets - 10 reps - Heel Toe Raises with Counter Support  - 1 x daily - 7 x weekly - 2 sets - 10 reps - Standing Hip Extension with Counter Support  - 1 x daily - 7 x weekly - 3 sets - 10 reps - Standing Hip Abduction with Counter Support  -  1 x daily - 7 x weekly - 3 sets - 10 reps - Standing Hip Flexion with Counter Support  - 1 x daily - 7 x weekly - 3 sets - 10 reps - Tandem Stance in Corner  - 1 x daily - 7 x weekly - 3 sets - 10 reps - Corner Balance  - 1 x daily - 7 x weekly - 3 sets - 10 reps   ASSESSMENT:  CLINICAL IMPRESSION: Raynell reports increased B hip pain as well as LBP today. When attempting to add resistance to standing 3-way SLR, he noted increased pain in his hip when looped RTB placed at ankles, but better tolerated when band moved to knees and hip flexion modified to march rather than SLR. Reviewed corner balance progression and added challenge of uneven surfaces with Airex pad, however instructed pt not to attempt cushion at home. Shikeem will benefit from continued skilled PT to address ongoing strength and balance deficits to improve mobility and activity tolerance with decreased pain interference.   OBJECTIVE IMPAIRMENTS:  Abnormal gait, decreased activity tolerance, decreased balance, decreased coordination, decreased endurance, decreased knowledge of condition, decreased knowledge of use of DME, decreased mobility, difficulty walking, decreased ROM, decreased strength, decreased safety awareness, impaired flexibility, improper body mechanics, postural dysfunction, and pain.   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   REHAB POTENTIAL: Good  CLINICAL DECISION MAKING: Stable/uncomplicated  EVALUATION COMPLEXITY: Low   GOALS: Goals reviewed with patient? Yes  SHORT TERM GOALS: Target date: 08/04/2023  Patient will be independent with initial HEP to improve outcomes and carryover.  Baseline: given 08/04/23 Goal status: MET - 08/10/23  2.  Patient will be educated on strategies to decrease risk of falls.  Baseline:  Goal status: MET - 08/06/23  3.  Patient will improve 5x STS time to </= 12 seconds for improved efficiency and safety with transfers. Baseline: 15.21 sec w/o UE assist Goal status: IN PROGRESS  LONG TERM GOALS: Target date: 08/25/2023  Patient will be independent with advanced/ongoing HEP to facilitate ability to maintain/progress functional gains from skilled physical therapy services. Baseline:  Goal status: IN PROGRESS  2.  Patient will be able to ambulate without gait deviations or staggering with or w/o LRAD on variable surfaces with good safety to access  community.  Baseline: Inconsistent foot clearance leading to occasional stagger Goal status: IN PROGRESS  3.  Patient will be able to step up/down curb safely with LRAD for safety with community ambulation.  Baseline:  Goal status: IN PROGRESS   4.  Patient will demonstrate improved B LE strength to >/= 4+/5 for improved stability and ease of mobility . Baseline: Refer to above MMT table Goal status: IN PROGRESS  5.  Patient will improve Berg score by >/= 8 points or to >/= 52/56 to improve safety and stability with ADLs in standing and reduce risk for falls. (MCID= 8 points)  Baseline: 49/56 Goal status: IN PROGRESS  6. Patient will improve FGA score to at least 22/30 to improve gait stability and reduce risk for falls. Baseline: 18/30 Goal status: IN PROGRESS  7.  Patient will report >/= 90% on ABC scale to demonstrate improved functional ability. Baseline: 1390 / 1600 = 86.9 % Goal status: IN PROGRESS   PLAN:  PT FREQUENCY: 1-2x/week  PT DURATION: 6 weeks  PLANNED INTERVENTIONS: Therapeutic exercises, Therapeutic activity, Neuromuscular re-education, Balance training, Gait training, Patient/Family education, Self Care, Joint mobilization, Stair training, DME instructions, Cryotherapy, Moist heat, Taping, Manual therapy, and Re-evaluation  PLAN FOR NEXT SESSION:  Re-assess 5xSTS; lower extremity flexibility stretching; core/lumbopelvic and LE strengthening; balance training; dynamic gait training   Marry Guan, PT 08/10/2023, 12:33 PM

## 2023-08-12 NOTE — Progress Notes (Signed)
[facility] Summary Report / Loop Recorder 

## 2023-08-13 ENCOUNTER — Ambulatory Visit: Payer: Medicare HMO

## 2023-08-13 DIAGNOSIS — Z9181 History of falling: Secondary | ICD-10-CM | POA: Diagnosis not present

## 2023-08-13 DIAGNOSIS — F10251 Alcohol dependence with alcohol-induced psychotic disorder with hallucinations: Secondary | ICD-10-CM | POA: Diagnosis not present

## 2023-08-13 DIAGNOSIS — I6782 Cerebral ischemia: Secondary | ICD-10-CM | POA: Diagnosis not present

## 2023-08-13 DIAGNOSIS — F4000 Agoraphobia, unspecified: Secondary | ICD-10-CM | POA: Diagnosis not present

## 2023-08-13 DIAGNOSIS — J441 Chronic obstructive pulmonary disease with (acute) exacerbation: Secondary | ICD-10-CM | POA: Diagnosis not present

## 2023-08-13 DIAGNOSIS — R2681 Unsteadiness on feet: Secondary | ICD-10-CM | POA: Diagnosis not present

## 2023-08-13 DIAGNOSIS — L89156 Pressure-induced deep tissue damage of sacral region: Secondary | ICD-10-CM | POA: Diagnosis not present

## 2023-08-13 DIAGNOSIS — M6281 Muscle weakness (generalized): Secondary | ICD-10-CM | POA: Diagnosis not present

## 2023-08-13 DIAGNOSIS — I10 Essential (primary) hypertension: Secondary | ICD-10-CM | POA: Diagnosis not present

## 2023-08-13 DIAGNOSIS — R2689 Other abnormalities of gait and mobility: Secondary | ICD-10-CM | POA: Diagnosis not present

## 2023-08-13 DIAGNOSIS — R278 Other lack of coordination: Secondary | ICD-10-CM | POA: Diagnosis not present

## 2023-08-13 DIAGNOSIS — C73 Malignant neoplasm of thyroid gland: Secondary | ICD-10-CM | POA: Diagnosis not present

## 2023-08-13 DIAGNOSIS — U071 COVID-19: Secondary | ICD-10-CM | POA: Diagnosis not present

## 2023-08-13 DIAGNOSIS — R062 Wheezing: Secondary | ICD-10-CM | POA: Diagnosis not present

## 2023-08-13 NOTE — Therapy (Signed)
OUTPATIENT PHYSICAL THERAPY TREATMENT   Patient Name: Timothy Conway MRN: [id number] DOB:Sep 19, 1956, 67 y.o., male Today's Date: 08/13/2023   END OF SESSION:  PT End of Session - 08/13/23 0955     Visit Number 5    Date for PT Re-Evaluation 08/25/23    Authorization Type [employer]    Authorization Time Period 07/14/23 - 08/25/23    Authorization - Visit Number 4    Authorization - Number of Visits 10    PT Start Time 0933    PT Stop Time 1015    PT Time Calculation (min) 42 min    Activity Tolerance Patient tolerated treatment well    Behavior During Therapy [facility] for tasks assessed/performed                Past Medical History:  Diagnosis Date   Adenocarcinoma, lung, left (HCC) 07/05/2017   Age-related osteoporosis without current pathological fracture 12/12/2021   Anxiety    COPD (chronic obstructive pulmonary disease) (HCC)    Depression    Dry eye syndrome    Dysrhythmia    hx of PAC's   Effusion of elbow joint, right 06/29/2017   Gastric ulcer with perforation but without obstruction (HCC) 04/15/2016   GERD (gastroesophageal reflux disease)    Gout    Hyperlipidemia    Hypertension    Hypothyroidism    OSA (obstructive sleep apnea) 02/18/2023   Papillary thyroid carcinoma s/p total thyroidectomy & CLND 08/27/2017 08/24/2017   Postsurgical hypothyroidism 10/01/2017   Thyroid cancer (HCC)    2018   Past Surgical History:  Procedure Laterality Date   ABDOMINAL SURGERY     hemmoraging ulcer   BRONCHIAL BIOPSY  06/25/2020   Procedure: BRONCHIAL BIOPSIES;  Surgeon: [name], MD;  Location: [facility] ENDOSCOPY;  Service: Pulmonary;;   BRONCHIAL BRUSHINGS  06/25/2020   Procedure: BRONCHIAL BRUSHINGS;  Surgeon: [name], MD;  Location: [facility] ENDOSCOPY;  Service: Pulmonary;;   BRONCHIAL NEEDLE ASPIRATION BIOPSY  06/25/2020   Procedure: BRONCHIAL NEEDLE ASPIRATION BIOPSIES;  Surgeon: [name], MD;  Location: [facility] ENDOSCOPY;  Service: Pulmonary;;   BRONCHIAL  WASHINGS  06/25/2020   Procedure: BRONCHIAL WASHINGS;  Surgeon: [name], MD;  Location: [facility] ENDOSCOPY;  Service: Pulmonary;;   CATARACT EXTRACTION Right    COLONOSCOPY     EYE SURGERY Right    x 4  between ages 37-13   FIDUCIAL MARKER PLACEMENT  06/25/2020   Procedure: FIDUCIAL MARKER PLACEMENT;  Surgeon: [name], MD;  Location: [facility] ENDOSCOPY;  Service: Pulmonary;;   THYROIDECTOMY N/A 08/27/2017   Procedure: TOTAL THYROIDECTOMY WITH LIMITED LYMPH NODE DISSECTION;  Surgeon: [name], MD;  Location: [facility] ORS;  Service: General;  Laterality: N/A;   UPPER GI ENDOSCOPY     VIDEO BRONCHOSCOPY WITH ENDOBRONCHIAL NAVIGATION N/A 06/25/2020   Procedure: VIDEO BRONCHOSCOPY WITH ENDOBRONCHIAL NAVIGATION;  Surgeon: [name], MD;  Location: [facility] ENDOSCOPY;  Service: Pulmonary;  Laterality: N/A;   Patient Active Problem List   Diagnosis Date Noted   Sleep apnea-like behavior 04/08/2023   Excessive daytime sleepiness 02/18/2023   History of gastric ulcer 02/18/2023   HTN (hypertension), benign 02/18/2023   Hx of papillary thyroid carcinoma 02/18/2023   Hypercholesteremia 02/18/2023   Low back pain 02/18/2023   Major depression, single episode 02/18/2023   Morbid obesity (HCC) 02/18/2023   Nodule of left lung 02/18/2023   OSA (obstructive sleep apnea) 02/18/2023   Non-restorative sleep 02/18/2023   Penetrating ulcer of aorta (HCC)  02/18/2023   Protrusion of intervertebral disc of thoracic region 02/18/2023   Seborrheic dermatitis 02/18/2023   Vitamin D deficiency 02/18/2023   Late effect of cerebrovascular accident (CVA) 01/05/2023   Dyslipidemia 01/05/2023   History of smoking 01/05/2023   Age-related osteoporosis without current pathological fracture 12/12/2021   Postsurgical hypothyroidism 10/01/2017   Papillary thyroid carcinoma s/p total thyroidectomy & CLND 08/27/2017 08/24/2017   Adenocarcinoma, lung, left (HCC) 07/05/2017   COPD (chronic obstructive pulmonary disease)  (HCC) 07/05/2017   Effusion of elbow joint, right 06/29/2017   Gastric ulcer with perforation but without obstruction (HCC) 04/15/2016    PCP: [name], MD   REFERRING PROVIDER: [name], MD   REFERRING DIAG: R53.81 (ICD-10-CM) - Declining functional status   THERAPY DIAG:  Unsteadiness on feet  Other abnormalities of gait and mobility  Muscle weakness (generalized)  History of falling  RATIONALE FOR EVALUATION AND TREATMENT: Rehabilitation  ONSET DATE: December 2023  NEXT MD VISIT: Unknown   SUBJECTIVE:                                                                                                                                                                                                         SUBJECTIVE STATEMENT: Timothy Conway continues to report soreness in hips, no recent falls or anything new.  PAIN: Are you having pain? 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   PRECAUTIONS: Other: Implanted loop recorder & completely blind in his right eye    RED FLAGS: None  WEIGHT BEARING RESTRICTIONS: No  FALLS:  Has patient fallen in last 6 months? No and Yes. Number of falls at least 6 in April/May per documentation in recent PT episode, however patient denies any recent falls  LIVING ENVIRONMENT:  Lives with: lives alone Lives in: House/apartment Stairs: No Has following equipment at home: Single point cane, Environmental consultant - 2 wheeled, shower chair, and Grab bars  OCCUPATION: Retired  PLOF: Independent and Leisure: watching TV, mostly sedentary   PATIENT GOALS:  "Strengthening my knees and improve my balance."   OBJECTIVE: (objective measures completed at initial evaluation unless otherwise dated)  DIAGNOSTIC FINDINGS:  04/06/23 - CT angio head & neck: IMPRESSION: 1. No large vessel occlusion. 2. Severe stenosis  of the right vertebral artery origin. 3. Approximately 50% stenosis of the proximal left ICA in the neck. 4. Left upper lobe treatment change, incompletely imaged and not well assessed on this study. Please see prior CT chest. 5. Bulky bridging anterior osteophytes at multiple cervical levels.  04/09/23 - CT chest: IMPRESSION: 1. Unchanged post treatment/post radiation appearance of the anterior left upper lobe with irregular scarring and volume loss with biopsy marking clips and or fiducial markers. Most discrete residual nodule anteriorly measures 1.1 x 0.8 cm, unchanged. 2. No evidence of recurrent or metastatic disease in the chest. 3. Mildly coarse contour of the liver, suggestive of cirrhosis.  11/07/22 - MRI brain: IMPRESSION: 1. Positive for a small subacute lacunar infarct in the Right cerebellum. No associated hemorrhage or mass effect. And underlying chronic small vessel ischemic disease in the cerebral white matter and basal ganglia. 2. No metastatic disease identified. 3. Bulky degenerative anterior cervical spine spurring at C3-C4.  COGNITION: Overall cognitive status: Impaired - reports STM deficits   SENSATION: WFL  COORDINATION: Grossly WFL  MUSCLE LENGTH: Hamstrings: Mod tight B ITB: Mild/mod tight B Piriformis: Mod/severe tight B Hip flexors: Mod tight B Quads: Mod tight B Heelcord: NT  POSTURE:  rounded shoulders, forward head, and wide BOS  LOWER EXTREMITY ROM:    B hips limited due to muscle tightness with B knee extension limited by HS tightness and weakness Active  Right Eval Left Eval  Knee flexion 128 132  Knee extension -9 LAQ / 0 supine -28 LAQ / 0 supine    LOWER EXTREMITY MMT:    MMT  Right Eval Left Eval  Hip flexion 4 4  Hip extension 3+ 4-  Hip abduction 4- 4-  Hip adduction 4 4  Hip internal rotation 4 4  Hip external rotation 4 4  Knee flexion 4+ 5  Knee extension 4 4  Ankle dorsiflexion 4+ 5  Ankle plantarflexion    Ankle inversion    Ankle eversion    (Blank rows = not tested)  BED MOBILITY:  Grossly independent  TRANSFERS: Assistive device utilized: None  Sit to stand: Complete Independence Stand to sit: Complete Independence Chair to chair:  NT Floor:  NT  GAIT: Distance walked: Clinic distances Assistive device utilized: None Level of assistance: Complete Independence and SBA Gait pattern: step through pattern, decreased arm swing- Right, decreased arm swing- Left, shuffling, wide BOS, poor foot clearance- Right, and poor foot clearance- Left Comments: Occasional staggering due to decreased foot clearance  STAIRS:  Level of Assistance: SBA  Stair Negotiation Technique: Alternating Pattern  with Single Rail on Right  Number of Stairs: 14   Height of Stairs: 7"  Comments: Inconsistent foot clearance on step with heels dragging down edge of step on some steps on descent  FUNCTIONAL TESTS:  5 times sit to stand: 15.21 sec w/o UE assist; 14.37 sec no UE support Timed up and go (TUG): 10.47 sec w/o AD 10 meter walk test: 9.25 sec; Gait speed: 3.55 ft/sec  Berg Balance Scale: 49/56 Functional gait assessment: 18/30; < 19 = high risk fall   PATIENT SURVEYS:  ABC scale 1390 / 1600 = 86.9 %   TODAY'S TREATMENT:  08/13/23 THERAPEUTIC EXERCISE: to improve flexibility, strength and mobility.  Demonstration, verbal and tactile cues throughout for technique.  NuStep - L6 x 6 min (UE/LE) 5xSTS assessed  Hooklying LTR x 10 each way Hooklying SKTC x 30 sec bil; 2nd set with opp LE extended x 30 sec  each B piriformis stretch and firugre 4 stretch x 30 sec each Attempted bridges but p! With B UE support: Hip extension with looped RTB at ankles 2  x 10 each bil Hip abduction with looped RTB at ankles 2 x 10 each bil,  Hip flexion SLR with looped RTB at ankles 2 x 10 each bil 08/10/23 THERAPEUTIC EXERCISE: to improve flexibility, strength and mobility.  Demonstration, verbal and tactile cues throughout for technique.  NuStep - L6 x 6 min (UE/LE) At counter for safety with SBA: Hip extension with looped RTB at ankles x 10 each bil, with looped RTB at knees x 5 each bil Hip abduction with looped RTB at ankles x 10 each bil, with looped RTB at knees x 5 each bil Hip flexion SLR with looped RTB at ankles x 10 each bil, march with looped RTB at knees x 5 each bil B side-stepping with looped RTB at distal thighs 2 x 10 ft  Fwd monster walk 4 x 10 ft and backward monster walk 2 x 10 ft along counter with looped RTB at distal thighs  NEUROMUSCULAR RE-EDUCATION: To improve balance, proprioception, coordination, and reduce fall risk.  SBA/CGA of PT for safety. Corner balance progression with 1/2 tandem stance on firm surface with arms crossed on chest            Eyes open: - static stance x 30 sec - horiz head turns x 10 - vertical head nods x 10 - trunk rotation x 10 Eyes closed: - static stance x 30 sec - horiz head turns x 10 - vertical head nods x 10 - trunk rotation x 10 Corner balance progression with narrow BOS on Airex pad with arms crossed on chest            Eyes open: - static stance x 30 sec - horiz head turns x 5 - vertical head nods x 5 - trunk rotation x 5 Eyes closed: - static stance x 30 sec - horiz head turns x 5 - vertical head nods x 2 - fwd LOB - trunk rotation x 2 - L lateral LOB   08/06/23 Therapeutic Exercise: to improve strength and mobility.  Demo, verbal and tactile cues throughout for technique. Nustep L6 x 6 min  Standing lumbar extension 10x Heel/toe raises 2x10 each Wall slides in runner stretch position x 5 switching LE after 5 Lumbar extension at wall 10x Church pews x 15 Retro steps x 10   Exercises seated with yellow weight ball -Side to side 2x10 -chest press 2x10    08/04/2023  Therapeutic Exercise: to improve strength and mobility.  Demo, verbal and tactile cues throughout for technique. Nustep L6 x 6 min  Low Squats with table behind for safety x 5  At counter for safety with SBA: Heel/toe raises x 10 Hip extension x 10 each Hip abduction x 10 each Hip flexion x 10 each Therapeutic Activity:   Berg balance test to assess static balance 49/56 Neuromuscular Reeducation: to improve balance and stability. SBA for safety throughout.  Corner balance exercises: Tandem stance x 30 sec each side Eyes open feet together x 30 sec  Eyes open feet together with head nods x 10 Eyes open feet together with head turns x 10 Eyes closed feet together x 30 sec Eyes closed feet together with head nods x 10 Eyes closed feet together with head turns x 10    PATIENT EDUCATION:  Education details: HEP update - bed hip and low  back stretches    Person educated: Patient Education method: Explanation, Demonstration, and Verbal cues Education comprehension: verbalized understanding and returned demonstration  HOME EXERCISE PROGRAM: Access Code: [email or web address] URL: [email or web address] Date: 08/13/2023 Prepared by: Verta Ellen  Exercises - Squat with Chair Touch  - 1 x daily - 7 x weekly - 2 sets - 10 reps - Heel Toe Raises with Counter Support  - 1 x daily - 7 x weekly - 2 sets - 10 reps - Standing Hip Extension with Counter Support  - 1 x daily - 7 x weekly - 3 sets - 10 reps - Standing Hip Abduction with Counter Support  - 1 x daily - 7 x weekly - 3 sets - 10 reps - Standing Hip Flexion with Counter Support  - 1 x daily - 7 x weekly - 3 sets - 10 reps - Tandem Stance in Corner  - 1 x daily - 7 x weekly - 3 sets - 10 reps - Corner Balance   - 1 x daily - 7 x weekly - 3 sets - 10 reps - Supine Lower Trunk Rotation  - 2 x daily - 7 x weekly - 3 sets - 10 reps -  Supine Single Knee to Chest Stretch  - 2 x daily - 7 x weekly - 3 sets - Supine Piriformis Stretch with Foot on Ground  - 2 x daily - 7 x weekly - 3 sets - 30 sec hold - Supine Figure 4 Piriformis Stretch  - 2 x daily - 7 x weekly - 3 sets - 30 sec hold   ASSESSMENT:  CLINICAL IMPRESSION: Worked on exercises to decrease B hip soreness and stiffness to help with improving gait pattern and overall balance. Good tolerance for the stretches but a bridge motion increases pain. Continued with hip strengthening for support and stability for the joints. Reassessed 5xSTS with patient showing 1 sec faster than last time assessed, progressing towards STG #3. HEP given for hip and LB stretches for patient to do in morning to help with soreness. Tywayne will benefit from continued skilled PT to address ongoing strength and balance deficits to improve mobility and activity tolerance with decreased pain interference.   OBJECTIVE IMPAIRMENTS: Abnormal gait, decreased activity tolerance, decreased balance, decreased coordination, decreased endurance, decreased knowledge of condition, decreased knowledge of use of DME, decreased mobility, difficulty walking, decreased ROM, decreased strength, decreased safety awareness, impaired flexibility, improper body mechanics, postural dysfunction, and pain.   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   REHAB POTENTIAL: Good  CLINICAL DECISION  MAKING: Stable/uncomplicated  EVALUATION COMPLEXITY: Low   GOALS: Goals reviewed with patient? Yes  SHORT TERM GOALS: Target date: 08/04/2023  Patient will be independent with initial HEP to improve outcomes and carryover.  Baseline: given 08/04/23 Goal status: MET - 08/10/23  2.  Patient will be educated on strategies to decrease risk of falls.  Baseline:  Goal status: MET - 08/06/23  3.  Patient will improve 5x STS time to </= 12 seconds for improved efficiency and safety with transfers. Baseline: 15.21 sec w/o UE assist Goal  status: IN PROGRESS- 08/13/23 - 14.37  LONG TERM GOALS: Target date: 08/25/2023  Patient will be independent with advanced/ongoing HEP to facilitate ability to maintain/progress functional gains from skilled physical therapy services. Baseline:  Goal status: IN PROGRESS  2.  Patient will be able to ambulate without gait deviations or staggering with or w/o LRAD on variable surfaces with good safety to access community.  Baseline: Inconsistent foot clearance leading to occasional stagger Goal status: IN PROGRESS  3.  Patient will be able to step up/down curb safely with LRAD for safety with community ambulation.  Baseline:  Goal status: IN PROGRESS   4.  Patient will demonstrate improved B LE strength to >/= 4+/5 for improved stability and ease of mobility . Baseline: Refer to above MMT table Goal status: IN PROGRESS  5.  Patient will improve Berg score by >/= 8 points or to >/= 52/56 to improve safety and stability with ADLs in standing and reduce risk for falls. (MCID= 8 points)  Baseline: 49/56 Goal status: IN PROGRESS  6. Patient will improve FGA score to at least 22/30 to improve gait stability and reduce risk for falls. Baseline: 18/30 Goal status: IN PROGRESS  7.  Patient will report >/= 90% on ABC scale to demonstrate improved functional ability. Baseline: 1390 / 1600 = 86.9 % Goal status: IN PROGRESS   PLAN:  PT FREQUENCY: 1-2x/week  PT  DURATION: 6 weeks  PLANNED INTERVENTIONS: Therapeutic exercises, Therapeutic activity, Neuromuscular re-education, Balance training, Gait training, Patient/Family education, Self Care, Joint mobilization, Stair training, DME instructions, Cryotherapy, Moist heat, Taping, Manual therapy, and Re-evaluation  PLAN FOR NEXT SESSION: lower extremity flexibility stretching; core/lumbopelvic and LE strengthening; balance training; dynamic gait training   Darleene Cleaver, PTA 08/13/2023, 10:57 AM

## 2023-08-17 ENCOUNTER — Ambulatory Visit: Payer: Medicare HMO | Admitting: Physical Therapy

## 2023-08-17 ENCOUNTER — Encounter: Payer: Self-pay | Admitting: Physical Therapy

## 2023-08-17 DIAGNOSIS — M6281 Muscle weakness (generalized): Secondary | ICD-10-CM

## 2023-08-17 DIAGNOSIS — R2689 Other abnormalities of gait and mobility: Secondary | ICD-10-CM

## 2023-08-17 DIAGNOSIS — Z9181 History of falling: Secondary | ICD-10-CM | POA: Diagnosis not present

## 2023-08-17 DIAGNOSIS — R2681 Unsteadiness on feet: Secondary | ICD-10-CM

## 2023-08-17 DIAGNOSIS — R278 Other lack of coordination: Secondary | ICD-10-CM | POA: Diagnosis not present

## 2023-08-17 NOTE — Therapy (Signed)
OUTPATIENT PHYSICAL THERAPY TREATMENT   Patient Name: Timothy Conway MRN: [id number] DOB:07/16/1956, 67 y.o., male Today's Date: 08/17/2023   END OF SESSION:  PT End of Session - 08/17/23 0929     Visit Number 6    Date for PT Re-Evaluation 08/25/23    Authorization Type [employer]    Authorization Time Period 07/14/23 - 08/25/23    Authorization - Visit Number 5    Authorization - Number of Visits 10    PT Start Time 0929    PT Stop Time 1012    PT Time Calculation (min) 43 min    Activity Tolerance Patient tolerated treatment well    Behavior During Therapy [facility] for tasks assessed/performed                 Past Medical History:  Diagnosis Date   Adenocarcinoma, lung, left (HCC) 07/05/2017   Age-related osteoporosis without current pathological fracture 12/12/2021   Anxiety    COPD (chronic obstructive pulmonary disease) (HCC)    Depression    Dry eye syndrome    Dysrhythmia    hx of PAC's   Effusion of elbow joint, right 06/29/2017   Gastric ulcer with perforation but without obstruction (HCC) 04/15/2016   GERD (gastroesophageal reflux disease)    Gout    Hyperlipidemia    Hypertension    Hypothyroidism    OSA (obstructive sleep apnea) 02/18/2023   Papillary thyroid carcinoma s/p total thyroidectomy & CLND 08/27/2017 08/24/2017   Postsurgical hypothyroidism 10/01/2017   Thyroid cancer (HCC)    2018   Past Surgical History:  Procedure Laterality Date   ABDOMINAL SURGERY     hemmoraging ulcer   BRONCHIAL BIOPSY  06/25/2020   Procedure: BRONCHIAL BIOPSIES;  Surgeon: Timothy Peer, MD;  Location: [facility] ENDOSCOPY;  Service: Pulmonary;;   BRONCHIAL BRUSHINGS  06/25/2020   Procedure: BRONCHIAL BRUSHINGS;  Surgeon: Timothy Peer, MD;  Location: [facility] ENDOSCOPY;  Service: Pulmonary;;   BRONCHIAL NEEDLE ASPIRATION BIOPSY  06/25/2020   Procedure: BRONCHIAL NEEDLE ASPIRATION BIOPSIES;  Surgeon: Timothy Peer, MD;  Location: [facility] ENDOSCOPY;  Service: Pulmonary;;   BRONCHIAL  WASHINGS  06/25/2020   Procedure: BRONCHIAL WASHINGS;  Surgeon: Timothy Peer, MD;  Location: [facility] ENDOSCOPY;  Service: Pulmonary;;   CATARACT EXTRACTION Right    COLONOSCOPY     EYE SURGERY Right    x 4  between ages 64-13   FIDUCIAL MARKER PLACEMENT  06/25/2020   Procedure: FIDUCIAL MARKER PLACEMENT;  Surgeon: Timothy Peer, MD;  Location: [facility] ENDOSCOPY;  Service: Pulmonary;;   THYROIDECTOMY N/A 08/27/2017   Procedure: TOTAL THYROIDECTOMY WITH LIMITED LYMPH NODE DISSECTION;  Surgeon: Timothy Level, MD;  Location: [facility] ORS;  Service: General;  Laterality: N/A;   UPPER GI ENDOSCOPY     VIDEO BRONCHOSCOPY WITH ENDOBRONCHIAL NAVIGATION N/A 06/25/2020   Procedure: VIDEO BRONCHOSCOPY WITH ENDOBRONCHIAL NAVIGATION;  Surgeon: Timothy Peer, MD;  Location: [facility] ENDOSCOPY;  Service: Pulmonary;  Laterality: N/A;   Patient Active Problem List   Diagnosis Date Noted   Sleep apnea-like behavior 04/08/2023   Excessive daytime sleepiness 02/18/2023   History of gastric ulcer 02/18/2023   HTN (hypertension), benign 02/18/2023   Hx of papillary thyroid carcinoma 02/18/2023   Hypercholesteremia 02/18/2023   Low back pain 02/18/2023   Major depression, single episode 02/18/2023   Morbid obesity (HCC) 02/18/2023   Nodule of left lung 02/18/2023   OSA (obstructive sleep apnea) 02/18/2023   Non-restorative sleep 02/18/2023   Penetrating ulcer of aorta (  HCC) 02/18/2023   Protrusion of intervertebral disc of thoracic region 02/18/2023   Seborrheic dermatitis 02/18/2023   Vitamin D deficiency 02/18/2023   Late effect of cerebrovascular accident (CVA) 01/05/2023   Dyslipidemia 01/05/2023   History of smoking 01/05/2023   Age-related osteoporosis without current pathological fracture 12/12/2021   Postsurgical hypothyroidism 10/01/2017   Papillary thyroid carcinoma s/p total thyroidectomy & CLND 08/27/2017 08/24/2017   Adenocarcinoma, lung, left (HCC) 07/05/2017   COPD (chronic obstructive pulmonary disease)  (HCC) 07/05/2017   Effusion of elbow joint, right 06/29/2017   Gastric ulcer with perforation but without obstruction (HCC) 04/15/2016    PCP: Timothy Bussing, MD   REFERRING PROVIDER: [name], MD   REFERRING DIAG: R53.81 (ICD-10-CM) - Declining functional status   THERAPY DIAG:  Unsteadiness on feet  Other abnormalities of gait and mobility  Muscle weakness (generalized)  History of falling  RATIONALE FOR EVALUATION AND TREATMENT: Rehabilitation  ONSET DATE: December 2023  NEXT MD VISIT: Unknown   SUBJECTIVE:                                                                                                                                                                                                         SUBJECTIVE STATEMENT: [name] reports he is "just a little sore in general", denies any localized pain. He states he is tired d/t not sleeping well and feels his balance is not as good today.  PAIN: Are you having pain? 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   PRECAUTIONS: Other: Implanted loop recorder & completely blind in his right eye    RED FLAGS: None  WEIGHT BEARING RESTRICTIONS: No  FALLS:  Has patient fallen in last 6 months? No and Yes. Number of falls at least 6 in April/May per documentation in recent PT episode, however patient denies any recent falls  LIVING ENVIRONMENT:  Lives with: lives alone Lives in: House/apartment Stairs: No Has following equipment at home: Single point cane, Environmental consultant - 2 wheeled, shower chair, and Grab bars  OCCUPATION: Retired  PLOF: Independent and Leisure: watching TV, mostly sedentary   PATIENT GOALS: "Strengthening my knees and improve my balance."   OBJECTIVE:  (objective measures completed at initial evaluation unless otherwise dated)  DIAGNOSTIC FINDINGS:  04/06/23 - CT angio head & neck: IMPRESSION: 1. No large vessel occlusion. 2. Severe stenosis of the  right vertebral artery origin. 3. Approximately 50% stenosis of the proximal left ICA in the neck. 4. Left upper lobe treatment change, incompletely imaged and not well assessed on this study. Please see prior CT chest. 5. Bulky bridging anterior osteophytes at multiple cervical levels.  04/09/23 - CT chest: IMPRESSION: 1. Unchanged post treatment/post radiation appearance of the anterior left upper lobe with irregular scarring and volume loss with biopsy marking clips and or fiducial markers. Most discrete residual nodule anteriorly measures 1.1 x 0.8 cm, unchanged. 2. No evidence of recurrent or metastatic disease in the chest. 3. Mildly coarse contour of the liver, suggestive of cirrhosis.  11/07/22 - MRI brain: IMPRESSION: 1. Positive for a small subacute lacunar infarct in the Right cerebellum. No associated hemorrhage or mass effect. And underlying chronic small vessel ischemic disease in the cerebral white matter and basal ganglia. 2. No metastatic disease identified. 3. Bulky degenerative anterior cervical spine spurring at C3-C4.  COGNITION: Overall cognitive status: Impaired - reports STM deficits   SENSATION: WFL  COORDINATION: Grossly WFL  MUSCLE LENGTH: Hamstrings: Mod tight B ITB: Mild/mod tight B Piriformis: Mod/severe tight B Hip flexors: Mod tight B Quads: Mod tight B Heelcord: NT  POSTURE:  rounded shoulders, forward head, and wide BOS  LOWER EXTREMITY ROM:    B hips limited due to muscle tightness with B knee extension limited by HS tightness and weakness Active  Right Eval Left Eval  Knee flexion 128 132  Knee extension -9 LAQ / 0 supine -28 LAQ / 0 supine    LOWER EXTREMITY MMT:    MMT Right eval Left eval R 08/17/23 L 08/17/23  Hip flexion 4 4 4+ 4+   Hip extension 3+ 4- 4 4+  Hip abduction 4- 4- 4 4  Hip adduction 4 4 4+ 4+  Hip internal rotation 4 4 5 5   Hip external rotation 4 4 4+ 4+  Knee flexion 4+ 5 5 5   Knee extension 4 4 5 5   Ankle dorsiflexion 4+ 5 5 5   Ankle plantarflexion      Ankle inversion      Ankle eversion      (Blank rows = not tested)  BED MOBILITY:  Grossly independent  TRANSFERS: Assistive device utilized: None  Sit to stand: Complete Independence Stand to sit: Complete Independence Chair to chair:  NT Floor:  NT  GAIT: Distance walked: Clinic distances Assistive device utilized: None Conway of assistance: Complete Independence and SBA Gait pattern: step through pattern, decreased arm swing- Right, decreased arm swing- Left, shuffling, wide BOS, poor foot clearance- Right, and poor foot clearance- Left Comments: Occasional staggering due to decreased foot clearance  STAIRS:  [place] of Assistance: SBA  Stair Negotiation Technique: Alternating Pattern  with Single Rail on Right  Number of Stairs: 14   Height of Stairs: 7"  Comments: Inconsistent foot clearance on step with heels dragging down edge of step on some steps on descent  FUNCTIONAL TESTS:  5 times sit to stand: 15.21 sec w/o UE assist; 14.37 sec no UE support Timed up and go (TUG): 10.47 sec w/o AD 10 meter walk test: 9.25 sec; Gait speed: 3.55 ft/sec  Berg Balance Scale: 49/56 Functional gait assessment: 18/30; < 19 = high risk fall   PATIENT SURVEYS:  ABC scale 1390 / 1600 = 86.9 %   TODAY'S TREATMENT:   08/17/23 THERAPEUTIC EXERCISE: to improve flexibility, strength and mobility.  Demonstration, verbal and tactile cues throughout for technique.  NuStep - L6 x  6 min (UE/LE) R/L SLS + opp LE RTB 4-way SLR x 10 each, 2 pole A for balance with SBA of PT (demonstrated how to use chair for balance at home) B side-stepping with looped RTB at ankles 4 x 10 ft - hands on wall for balance Monster walk with looped RTB at ankles 4 x 10 ft  forward, 2 x 10 ft backwards - hand on wall for balance  THERAPEUTIC ACTIVITIES: LE MMT   08/13/23 THERAPEUTIC EXERCISE: to improve flexibility, strength and mobility.  Demonstration, verbal and tactile cues throughout for technique.  NuStep - L6 x 6 min (UE/LE) 5xSTS assessed  Hooklying LTR x 10 each way Hooklying SKTC x 30 sec bil; 2nd set with opp LE extended x 30 sec each B piriformis stretch and firugre 4 stretch x 30 sec each Attempted bridges but p! With B UE support: Hip extension with looped RTB at ankles 2 x 10 each bil Hip abduction with looped RTB at ankles 2 x 10 each bil,  Hip flexion SLR with looped RTB at ankles 2 x 10 each bil   08/10/23 THERAPEUTIC EXERCISE: to improve flexibility, strength and mobility.  Demonstration, verbal and tactile cues throughout for technique.  NuStep - L6 x 6 min (UE/LE) At counter for safety with SBA: Hip extension with looped RTB at ankles x 10 each bil, with looped RTB at knees x 5 each bil Hip abduction with looped RTB at ankles x 10 each bil, with looped RTB at knees x 5 each bil Hip flexion SLR with looped RTB at ankles x 10 each bil, march with looped RTB at knees x 5 each bil B side-stepping with looped RTB at distal thighs 2 x 10 ft  Fwd monster walk 4 x 10 ft and backward monster walk 2 x 10 ft along counter with looped RTB at distal thighs  NEUROMUSCULAR RE-EDUCATION: To improve balance, proprioception, coordination, and reduce fall risk.  SBA/CGA of PT for safety. Corner balance progression with 1/2 tandem stance on firm surface with arms crossed on chest            Eyes open: - static stance x 30 sec - horiz head turns x 10 - vertical head nods x 10 - trunk rotation x 10 Eyes closed: - static stance x 30 sec - horiz head turns x 10 - vertical head nods x 10 - trunk rotation x 10 Corner balance progression with narrow BOS on Airex pad with arms crossed on chest            Eyes open: - static stance x 30 sec - horiz  head turns x 5 - vertical head nods x 5 - trunk rotation x 5 Eyes closed: - static stance x 30 sec - horiz head turns x 5 - vertical head nods x 2 - fwd LOB - trunk rotation x 2 - L lateral LOB   PATIENT EDUCATION:  Education details: HEP progression - progression of hip strengthening & balance   Person educated: Patient Education method: Explanation, Demonstration, and Verbal cues Education comprehension: verbalized understanding and returned demonstration  HOME EXERCISE PROGRAM: Access Code: [email or web address] URL: [email or web address] Date: 08/17/2023 Prepared by: Glenetta Hew  Exercises - Squat with Chair Touch  - 1 x daily - 7 x weekly - 2 sets - 10 reps - Heel Toe Raises with Counter Support  - 1 x daily - 7 x weekly - 2 sets - 10 reps - Tandem Stance in Corner  - 1 x  daily - 7 x weekly - 3 sets - 10 reps - Corner Balance   - 1 x daily - 7 x weekly - 3 sets - 10 reps - Supine Lower Trunk Rotation  - 2 x daily - 7 x weekly - 3 sets - 10 reps - Supine Single Knee to Chest Stretch  - 2 x daily - 7 x weekly - 3 sets - Supine Piriformis Stretch with Foot on Ground  - 2 x daily - 7 x weekly - 3 sets - 30 sec hold - Supine Figure 4 Piriformis Stretch  - 2 x daily - 7 x weekly - 3 sets - 30 sec hold - Standing Hip Flexion with Anchored Resistance and Chair Support  - 1 x daily - 3-4 x weekly - 2 sets - 10 reps - 3 sec hold - Standing Hip Adduction with Anchored Resistance  - 1 x daily - 3-4 x weekly - 2 sets - 10 reps - 3 sec hold - Standing Hip Extension with Anchored Resistance  - 1 x daily - 3-4 x weekly - 2 sets - 10 reps - 3 sec hold - Standing Hip Abduction with Anchored Resistance  - 1 x daily - 3-4 x weekly - 2 sets - 10 reps - 3 sec hold - Side Stepping with Resistance at Ankles and Counter Support  - 1 x daily - 3-4 x weekly - 2 sets - 10 reps - 3 sec hold - Forward and Backward Monster Walk with Resistance at Ankles and Counter Support  - 1 x daily - 3-4 x weekly - 2  sets - 10 reps - 3 sec hold   ASSESSMENT:  CLINICAL IMPRESSION: Jamerius notes more limitation with balance today which he attributes to being tired from poor sleep.  LE strength reassessed with MMT revealing gains across all muscle groups, however ongoing weakness most prominent proximally at hips.  Progressed hip strengthening adding balance and dynamic movement/stepping components with exercises well-tolerated although intermittent rest breaks necessary due to fatigue.  Demonstration provided for safe home performance with HEP updated accordingly.  Flake is nearing the end of his current POC, therefore will continue to incorporate goal assessment into remaining visits to help determine readiness for transition to HEP versus need for recertification.  Jermal will benefit from continued skilled PT to address ongoing strength and balance deficits to improve mobility and activity tolerance with decreased pain interference.   OBJECTIVE IMPAIRMENTS: Abnormal gait, decreased activity tolerance, decreased balance, decreased coordination, decreased endurance, decreased knowledge of condition, decreased knowledge of use of DME, decreased mobility, difficulty walking, decreased ROM, decreased strength, decreased safety awareness, impaired flexibility, improper body mechanics, postural dysfunction, and pain.   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   REHAB POTENTIAL:  Good  CLINICAL DECISION MAKING: Stable/uncomplicated  EVALUATION COMPLEXITY: Low   GOALS: Goals reviewed with patient? Yes  SHORT TERM GOALS: Target date: 08/04/2023  Patient will be independent with initial HEP to improve outcomes and carryover.  Baseline: given 08/04/23 Goal status: MET - 08/10/23  2.  Patient will be educated on strategies to  decrease risk of falls.  Baseline:  Goal status: MET - 08/06/23  3.  Patient will improve 5x STS time to </= 12 seconds for improved efficiency and safety with transfers. Baseline: 15.21 sec w/o UE assist Goal status: IN PROGRESS - 08/13/23 - 14.37  LONG TERM GOALS: Target date: 08/25/2023  Patient will be independent with advanced/ongoing HEP to facilitate ability to maintain/progress functional gains from skilled physical therapy services. Baseline:  Goal status: IN PROGRESS - 08/17/23 - Met for current HEP, but updated today  2.  Patient will be able to ambulate without gait deviations or staggering with or w/o LRAD on variable surfaces with good safety to access community.  Baseline: Inconsistent foot clearance leading to occasional stagger Goal status: IN PROGRESS  3.  Patient will be able to step up/down curb safely with LRAD for safety with community ambulation.  Baseline:  Goal status: IN PROGRESS   4.  Patient will demonstrate improved B LE strength to >/= 4+/5 for improved stability and ease of mobility . Baseline: Refer to above MMT table Goal status: IN PROGRESS - 08/17/23 - Met except B hip ABD and R hip extension 4/5  5.  Patient will improve Berg score by >/= 8 points or to >/= 52/56 to improve safety and stability with ADLs in standing and reduce risk for falls. (MCID= 8 points)  Baseline: 49/56 Goal status: IN PROGRESS  6. Patient will improve FGA score to at least 22/30 to improve gait stability and reduce risk for falls. Baseline: 18/30 Goal status: IN PROGRESS  7.  Patient will report >/= 90% on ABC scale to  demonstrate improved functional ability. Baseline: 1390 / 1600 = 86.9 % Goal status: IN PROGRESS   PLAN:  PT FREQUENCY: 1-2x/week  PT DURATION: 6 weeks  PLANNED INTERVENTIONS: Therapeutic exercises, Therapeutic activity, Neuromuscular re-education, Balance training, Gait training, Patient/Family education, Self Care, Joint mobilization, Stair training, DME instructions, Cryotherapy, Moist heat, Taping, Manual therapy, and Re-evaluation  PLAN FOR NEXT SESSION: Assess gait outdoors including curb negotiation as weather permits; LE flexibility/stretching; core/lumbopelvic and LE strengthening; balance training; dynamic gait training   Marry Guan, PT 08/17/2023, 10:15 AM

## 2023-08-20 ENCOUNTER — Ambulatory Visit: Payer: Medicare HMO

## 2023-08-20 DIAGNOSIS — Z9181 History of falling: Secondary | ICD-10-CM | POA: Diagnosis not present

## 2023-08-20 DIAGNOSIS — R2681 Unsteadiness on feet: Secondary | ICD-10-CM

## 2023-08-20 DIAGNOSIS — R278 Other lack of coordination: Secondary | ICD-10-CM | POA: Diagnosis not present

## 2023-08-20 DIAGNOSIS — M6281 Muscle weakness (generalized): Secondary | ICD-10-CM | POA: Diagnosis not present

## 2023-08-20 DIAGNOSIS — R2689 Other abnormalities of gait and mobility: Secondary | ICD-10-CM | POA: Diagnosis not present

## 2023-08-20 NOTE — Therapy (Signed)
OUTPATIENT PHYSICAL THERAPY TREATMENT   Patient Name: Timothy Conway MRN: [id number] DOB:[birth year], 67 y.o., male Today's Date: 08/20/2023   END OF SESSION:  PT End of Session - 08/20/23 0938     Visit Number 7    Date for PT Re-Evaluation 08/25/23    Authorization Type [employer]    Authorization Time Period 07/14/23 - 08/25/23    Authorization - Visit Number 6    Authorization - Number of Visits 10    PT Start Time 0930    PT Stop Time 1016    PT Time Calculation (min) 46 min    Activity Tolerance Patient tolerated treatment well    Behavior During Therapy [facility] for tasks assessed/performed                  Past Medical History:  Diagnosis Date   Adenocarcinoma, lung, left (HCC) 07/05/2017   Age-related osteoporosis without current pathological fracture 12/12/2021   Anxiety    COPD (chronic obstructive pulmonary disease) (HCC)    Depression    Dry eye syndrome    Dysrhythmia    hx of PAC's   Effusion of elbow joint, right 06/29/2017   Gastric ulcer with perforation but without obstruction (HCC) 04/15/2016   GERD (gastroesophageal reflux disease)    Gout    Hyperlipidemia    Hypertension    Hypothyroidism    OSA (obstructive sleep apnea) 02/18/2023   Papillary thyroid carcinoma s/p total thyroidectomy & CLND 08/27/2017 08/24/2017   Postsurgical hypothyroidism 10/01/2017   Thyroid cancer (HCC)    2018   Past Surgical History:  Procedure Laterality Date   ABDOMINAL SURGERY     hemmoraging ulcer   BRONCHIAL BIOPSY  06/25/2020   Procedure: BRONCHIAL BIOPSIES;  Surgeon: [name], MD;  Location: [facility] ENDOSCOPY;  Service: Pulmonary;;   BRONCHIAL BRUSHINGS  06/25/2020   Procedure: BRONCHIAL BRUSHINGS;  Surgeon: [name], MD;  Location: [facility] ENDOSCOPY;  Service: Pulmonary;;   BRONCHIAL NEEDLE ASPIRATION BIOPSY  06/25/2020   Procedure: BRONCHIAL NEEDLE ASPIRATION BIOPSIES;  Surgeon: [name], MD;  Location: [facility] ENDOSCOPY;  Service: Pulmonary;;   BRONCHIAL  WASHINGS  06/25/2020   Procedure: BRONCHIAL WASHINGS;  Surgeon: [name], MD;  Location: [facility] ENDOSCOPY;  Service: Pulmonary;;   CATARACT EXTRACTION Right    COLONOSCOPY     EYE SURGERY Right    x 4  between ages 64-13   FIDUCIAL MARKER PLACEMENT  06/25/2020   Procedure: FIDUCIAL MARKER PLACEMENT;  Surgeon: [name], MD;  Location: [facility] ENDOSCOPY;  Service: Pulmonary;;   THYROIDECTOMY N/A 08/27/2017   Procedure: TOTAL THYROIDECTOMY WITH LIMITED LYMPH NODE DISSECTION;  Surgeon: [name], MD;  Location: [facility] ORS;  Service: General;  Laterality: N/A;   UPPER GI ENDOSCOPY     VIDEO BRONCHOSCOPY WITH ENDOBRONCHIAL NAVIGATION N/A 06/25/2020   Procedure: VIDEO BRONCHOSCOPY WITH ENDOBRONCHIAL NAVIGATION;  Surgeon: [name], MD;  Location: [facility] ENDOSCOPY;  Service: Pulmonary;  Laterality: N/A;   Patient Active Problem List   Diagnosis Date Noted   Sleep apnea-like behavior 04/08/2023   Excessive daytime sleepiness 02/18/2023   History of gastric ulcer 02/18/2023   HTN (hypertension), benign 02/18/2023   Hx of papillary thyroid carcinoma 02/18/2023   Hypercholesteremia 02/18/2023   Low back pain 02/18/2023   Major depression, single episode 02/18/2023   Morbid obesity (HCC) 02/18/2023   Nodule of left lung 02/18/2023   OSA (obstructive sleep apnea) 02/18/2023   Non-restorative sleep 02/18/2023   Penetrating ulcer of  aorta (HCC) 02/18/2023   Protrusion of intervertebral disc of thoracic region 02/18/2023   Seborrheic dermatitis 02/18/2023   Vitamin D deficiency 02/18/2023   Late effect of cerebrovascular accident (CVA) 01/05/2023   Dyslipidemia 01/05/2023   History of smoking 01/05/2023   Age-related osteoporosis without current pathological fracture 12/12/2021   Postsurgical hypothyroidism 10/01/2017   Papillary thyroid carcinoma s/p total thyroidectomy & CLND 08/27/2017 08/24/2017   Adenocarcinoma, lung, left (HCC) 07/05/2017   COPD (chronic obstructive pulmonary disease)  (HCC) 07/05/2017   Effusion of elbow joint, right 06/29/2017   Gastric ulcer with perforation but without obstruction (HCC) 04/15/2016    PCP: [name], MD   REFERRING PROVIDER: [name], MD   REFERRING DIAG: R53.81 (ICD-10-CM) - Declining functional status   THERAPY DIAG:  Unsteadiness on feet  Other abnormalities of gait and mobility  Muscle weakness (generalized)  History of falling  RATIONALE FOR EVALUATION AND TREATMENT: Rehabilitation  ONSET DATE: December 2023  NEXT MD VISIT: Unknown   SUBJECTIVE:                                                                                                                                                                                                         SUBJECTIVE STATEMENT: Timothy Conway reports that he remains sore in his hips. The new exercises going fair, the hip abduction doesn't feel too good on his hips in the morning but as the day goes on it's better.  PAIN: Are you having pain? 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   PRECAUTIONS: Other: Implanted loop recorder & completely blind in his right eye    RED FLAGS: None  WEIGHT BEARING RESTRICTIONS: No  FALLS:  Has patient fallen in last 6 months? No and Yes. Number of falls at least 6 in April/May per documentation in recent PT episode, however patient denies any recent falls  LIVING ENVIRONMENT:  Lives with: lives alone Lives in: House/apartment Stairs: No Has following equipment at home: Single point cane, Environmental consultant - 2 wheeled, shower chair, and Grab bars  OCCUPATION: Retired  PLOF: Independent and Leisure: watching TV, mostly sedentary    PATIENT GOALS: "Strengthening my knees and improve my balance."   OBJECTIVE: (objective measures completed at initial evaluation unless otherwise dated)  DIAGNOSTIC FINDINGS:  04/06/23 - CT  angio head & neck: IMPRESSION: 1. No large vessel occlusion. 2. Severe stenosis of the right vertebral artery origin. 3. Approximately 50% stenosis of the proximal left ICA in the neck. 4. Left upper lobe treatment change, incompletely imaged and not well assessed on this study. Please see prior CT chest. 5. Bulky bridging anterior osteophytes at multiple cervical levels.  04/09/23 - CT chest: IMPRESSION: 1. Unchanged post treatment/post radiation appearance of the anterior left upper lobe with irregular scarring and volume loss with biopsy marking clips and or fiducial markers. Most discrete residual nodule anteriorly measures 1.1 x 0.8 cm, unchanged. 2. No evidence of recurrent or metastatic disease in the chest. 3. Mildly coarse contour of the liver, suggestive of cirrhosis.  11/07/22 - MRI brain: IMPRESSION: 1. Positive for a small subacute lacunar infarct in the Right cerebellum. No associated hemorrhage or mass effect. And underlying chronic small vessel ischemic disease in the cerebral white matter and basal ganglia. 2. No metastatic disease identified. 3. Bulky degenerative anterior cervical spine spurring at C3-C4.  COGNITION: Overall cognitive status: Impaired - reports STM deficits   SENSATION: WFL  COORDINATION: Grossly WFL  MUSCLE LENGTH: Hamstrings: Mod tight B ITB: Mild/mod tight B Piriformis: Mod/severe tight B Hip flexors: Mod tight B Quads: Mod tight B Heelcord: NT  POSTURE:  rounded shoulders, forward head, and wide BOS  LOWER EXTREMITY ROM:    B hips limited due to muscle tightness with B knee extension limited by HS tightness and weakness Active  Right Eval Left Eval  Knee flexion 128 132  Knee extension -9 LAQ / 0 supine -28 LAQ / 0 supine    LOWER  EXTREMITY MMT:    MMT Right eval Left eval R 08/17/23 L 08/17/23  Hip flexion 4 4 4+ 4+  Hip extension 3+ 4- 4 4+  Hip abduction 4- 4- 4 4  Hip adduction 4 4 4+ 4+  Hip internal rotation 4 4 5 5   Hip external rotation 4 4 4+ 4+  Knee flexion 4+ 5 5 5   Knee extension 4 4 5 5   Ankle dorsiflexion 4+ 5 5 5   Ankle plantarflexion      Ankle inversion      Ankle eversion      (Blank rows = not tested)  BED MOBILITY:  Grossly independent  TRANSFERS: Assistive device utilized: None  Sit to stand: Complete Independence Stand to sit: Complete Independence Chair to chair:  NT Floor:  NT  GAIT: Distance walked: Clinic distances Assistive device utilized: None Level of assistance: Complete Independence and SBA Gait pattern: step through pattern, decreased arm swing- Right, decreased arm swing- Left, shuffling, wide BOS, poor foot clearance- Right, and poor foot clearance- Left Comments: Occasional staggering due to decreased foot clearance  STAIRS:  Level of Assistance: SBA  Stair Negotiation Technique: Alternating Pattern  with Single Rail on Right  Number of Stairs: 14   Height of Stairs: 7"  Comments: Inconsistent foot clearance on step with heels dragging down edge of step on some steps on descent  FUNCTIONAL TESTS:  5 times sit to stand: 15.21 sec w/o UE assist; 14.37 sec no UE support Timed up and go (TUG): 10.47 sec w/o AD 10 meter walk test: 9.25 sec; Gait speed: 3.55 ft/sec  Berg Balance Scale: 49/56 Functional gait assessment: 18/30; < 19 = high risk fall   PATIENT SURVEYS:  ABC scale 1390 / 1600 = 86.9 %   TODAY'S TREATMENT:  08/20/23 THERAPEUTIC EXERCISE: to improve flexibility, strength and mobility.  Demonstration, verbal and tactile cues throughout for technique.  NuStep - L6 x 6 min (UE/LE) Supine LTR x 10 each way Supine HS stretch 2x30 sec with strap bil Supine quad stretch with strap 2x30 sec bil Supine hip ADD ball squeeze 10x3" Seated hamstring stretch  2x30 sec bil B side steps and monster walks RTB at ankles 4x12 ft Standing hip abd to ext RTB 4x12 ft  08/17/23 THERAPEUTIC EXERCISE: to improve flexibility, strength and mobility.  Demonstration, verbal and tactile cues throughout for technique.  NuStep - L6 x 6 min (UE/LE) R/L SLS + opp LE RTB 4-way SLR x 10 each, 2 pole A for balance with SBA of PT (demonstrated how to use chair for balance at home) B side-stepping with looped RTB at ankles 4 x 10 ft - hands on wall for balance Monster walk with looped RTB at ankles 4 x 10 ft forward, 2 x 10 ft backwards - hand on wall for balance  THERAPEUTIC ACTIVITIES: LE MMT   08/13/23 THERAPEUTIC EXERCISE: to improve flexibility, strength and mobility.  Demonstration, verbal and tactile cues throughout for technique.  NuStep - L6 x 6 min (UE/LE) 5xSTS assessed  Hooklying LTR x 10 each way Hooklying SKTC x 30 sec bil; 2nd set with opp LE extended x 30 sec each B piriformis stretch and firugre 4 stretch x 30 sec each Attempted bridges but p! With B UE support: Hip extension with looped RTB at ankles 2 x 10 each bil Hip abduction with looped RTB at ankles 2 x 10 each bil,  Hip flexion SLR with looped RTB at ankles 2 x 10 each bil   08/10/23 THERAPEUTIC EXERCISE: to improve flexibility, strength and mobility.  Demonstration, verbal and tactile cues throughout for technique.  NuStep - L6 x 6 min (UE/LE) At counter for safety with SBA: Hip extension with looped RTB at ankles x 10 each bil, with looped RTB at knees x 5 each bil Hip abduction with looped RTB at ankles x 10 each bil, with looped RTB at knees x 5 each bil Hip flexion SLR with looped RTB at ankles x 10 each bil, march with looped RTB at knees x 5 each bil B side-stepping with looped RTB at distal thighs 2 x 10 ft  Fwd monster walk 4 x 10 ft and backward monster walk 2 x 10 ft along counter with looped RTB at distal thighs  NEUROMUSCULAR RE-EDUCATION: To improve balance,  proprioception, coordination, and reduce fall risk.  SBA/CGA of PT for safety. Corner balance progression with 1/2 tandem stance on firm surface with arms crossed on chest            Eyes open: - static stance x 30 sec - horiz head turns x 10 - vertical head nods x 10 - trunk rotation x 10 Eyes closed: - static stance x 30 sec - horiz head turns x 10 - vertical head nods x 10 - trunk rotation x 10 Corner balance progression with narrow BOS on Airex pad with arms crossed on chest            Eyes open: - static stance x 30 sec - horiz head turns x 5 - vertical head nods x 5 - trunk rotation x 5 Eyes closed: - static stance x 30 sec - horiz head turns x 5 - vertical head nods x 2 - fwd LOB - trunk rotation x 2 - L lateral LOB   PATIENT EDUCATION:  Education details: HEP progression -  added stretches for HS, quads, and hip ADD ball squeeze   Person educated: Patient Education method: Explanation, Demonstration, and Verbal cues Education comprehension: verbalized understanding and returned demonstration  HOME EXERCISE PROGRAM: Access Code: [email or web address] URL: [email or web address] Date: 08/20/2023 Prepared by: Verta Ellen  Exercises - Squat with Chair Touch  - 1 x daily - 7 x weekly - 2 sets - 10 reps - Heel Toe Raises with Counter Support  - 1 x daily - 7 x weekly - 2 sets - 10 reps - Tandem Stance in Corner  - 1 x daily - 7 x weekly - 3 sets - 10 reps - Corner Balance   - 1 x daily - 7 x weekly - 3 sets - 10 reps - Supine Lower Trunk Rotation  - 2 x daily - 7 x weekly - 3 sets - 10 reps - Supine Quadriceps Stretch with Strap on Table  - 2 x daily - 7 x weekly - 3 sets - 3 reps - 30 sec hold - Supine Single Knee to Chest Stretch  - 2 x daily - 7 x weekly - 3 sets - Supine Piriformis Stretch with Foot on Ground  - 2 x daily - 7 x weekly - 3 sets - 30 sec hold - Supine Figure 4 Piriformis Stretch  - 2 x daily - 7 x weekly - 3 sets - 30 sec hold - Supine Hip  Adduction Isometric with Ball  - 1 x daily - 7 x weekly - 3 sets - 10 reps - 3 sec hold - Seated Hamstring Stretch  - 2 x daily - 7 x weekly - 3 sets - 30 sec hold - Standing Hip Flexion with Anchored Resistance and Chair Support  - 1 x daily - 3-4 x weekly - 2 sets - 10 reps - 3 sec hold - Standing Hip Adduction with Anchored Resistance  - 1 x daily - 3-4 x weekly - 2 sets - 10 reps - 3 sec hold - Standing Hip Extension with Anchored Resistance  - 1 x daily - 3-4 x weekly - 2 sets - 10 reps - 3 sec hold - Standing Hip Abduction with Anchored Resistance  - 1 x daily - 3-4 x weekly - 2 sets - 10 reps - 3 sec hold - Side Stepping with Resistance at Ankles and Counter Support  - 1 x daily - 3-4 x weekly - 2 sets - 10 reps - 3 sec hold - Forward and Backward Monster Walk with Resistance at Ankles and Counter Support  - 1 x daily - 3-4 x weekly - 2 sets - 10 reps - 3 sec hold   ASSESSMENT:  CLINICAL IMPRESSION: Focused session on addressing B hip soreness and pain to help with balance and stability.Marland Kitchen He showed a better demonstration of the seated hamstring stretch rather than the supine version. Continued to address weakness in glutes with strengthening exercises as well. Cues required with supine hamstring stretch to keep knees straight and to avoid painful ROM with quad stretches. He feels like he needs to continue PT, will need re-evaluation by PT next visit. Timothy Conway will benefit from continued skilled PT to address ongoing strength and balance deficits to improve mobility and activity tolerance with decreased pain interference.   OBJECTIVE IMPAIRMENTS: Abnormal gait, decreased activity tolerance, decreased balance, decreased coordination, decreased endurance, decreased knowledge of condition, decreased knowledge of use of DME, decreased mobility, difficulty walking, decreased ROM, decreased strength, decreased safety awareness, impaired flexibility, improper body mechanics, postural  dysfunction, and  pain.   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   REHAB POTENTIAL: Good  CLINICAL DECISION MAKING: Stable/uncomplicated  EVALUATION COMPLEXITY: Low   GOALS: Goals reviewed with patient? Yes  SHORT TERM GOALS: Target date: 08/04/2023  Patient will be independent with initial HEP to improve outcomes and carryover.  Baseline: given 08/04/23 Goal status: MET - 08/10/23  2.  Patient will be educated on strategies to decrease risk of falls.  Baseline:  Goal status: MET - 08/06/23  3.  Patient will improve 5x STS time to </= 12 seconds for improved efficiency and safety with transfers. Baseline: 15.21 sec w/o UE assist Goal status: IN PROGRESS - 08/13/23 - 14.37  LONG TERM GOALS: Target date: 08/25/2023  Patient will be independent with advanced/ongoing HEP to facilitate ability to maintain/progress functional gains from skilled physical therapy services. Baseline:  Goal status: IN PROGRESS - 08/17/23 - Met for current HEP, but updated today  2.  Patient will be able to ambulate without gait deviations or staggering with or w/o LRAD on variable surfaces with good safety to access community.  Baseline: Inconsistent foot clearance leading to occasional stagger Goal status: IN PROGRESS  3.  Patient will be able to step up/down curb safely with LRAD for safety with community ambulation.  Baseline:  Goal status: IN PROGRESS   4.  Patient will demonstrate improved  B LE strength to >/= 4+/5 for improved stability and ease of mobility . Baseline: Refer to above MMT table Goal status: IN PROGRESS - 08/17/23 - Met except B hip ABD and R hip extension 4/5  5.  Patient will improve Berg score by >/= 8 points or to >/= 52/56 to improve safety and stability with ADLs in standing and reduce risk for falls. (MCID= 8 points)  Baseline: 49/56 Goal status: IN PROGRESS  6. Patient will improve FGA score to at least 22/30 to improve gait stability and reduce risk for falls. Baseline: 18/30 Goal status: IN PROGRESS  7.  Patient will report >/= 90% on ABC scale to demonstrate improved functional ability. Baseline: 1390 / 1600 = 86.9 % Goal status: IN PROGRESS   PLAN:  PT FREQUENCY: 1-2x/week  PT DURATION: 6 weeks  PLANNED INTERVENTIONS: Therapeutic exercises, Therapeutic activity, Neuromuscular re-education, Balance training, Gait training, Patient/Family education, Self Care, Joint mobilization, Stair training, DME instructions, Cryotherapy, Moist heat, Taping, Manual therapy, and Re-evaluation  PLAN FOR NEXT SESSION: PN/recert; Assess gait outdoors including curb negotiation as weather permits; LE flexibility/stretching; core/lumbopelvic and LE strengthening; balance training; dynamic gait training   Darleene Cleaver, PTA 08/20/2023, 10:19 AM

## 2023-08-24 ENCOUNTER — Encounter: Payer: Self-pay | Admitting: Physical Therapy

## 2023-08-24 ENCOUNTER — Ambulatory Visit: Payer: Medicare HMO | Admitting: Physical Therapy

## 2023-08-24 DIAGNOSIS — Z9181 History of falling: Secondary | ICD-10-CM | POA: Diagnosis not present

## 2023-08-24 DIAGNOSIS — R2689 Other abnormalities of gait and mobility: Secondary | ICD-10-CM

## 2023-08-24 DIAGNOSIS — R278 Other lack of coordination: Secondary | ICD-10-CM | POA: Diagnosis not present

## 2023-08-24 DIAGNOSIS — M6281 Muscle weakness (generalized): Secondary | ICD-10-CM | POA: Diagnosis not present

## 2023-08-24 DIAGNOSIS — R2681 Unsteadiness on feet: Secondary | ICD-10-CM

## 2023-08-24 NOTE — Therapy (Signed)
OUTPATIENT PHYSICAL THERAPY TREATMENT / RECERTIFICATION  Progress Note  Reporting Period 07/14/2023 to 08/24/2023  See note below for Objective Data and Assessment of Progress/Goals.     Patient Name: Timothy Conway MRN: [id number] DOB:1956-02-06, 67 y.o., male Today's Date: 08/24/2023   END OF SESSION:  PT End of Session - 08/24/23 0932     Visit Number 8    Date for PT Re-Evaluation 09/21/23    Authorization Type [employer]    Authorization Time Period 07/14/23 - 08/25/23    Authorization - Visit Number 7    Authorization - Number of Visits 10    Progress Note Due on Visit 18   Recert on visit #8 - 08/24/23   PT Start Time 0932    PT Stop Time 1016    PT Time Calculation (min) 44 min    Activity Tolerance Patient tolerated treatment well    Behavior During Therapy [facility] for tasks assessed/performed                   Past Medical History:  Diagnosis Date   Adenocarcinoma, lung, left (HCC) 07/05/2017   Age-related osteoporosis without current pathological fracture 12/12/2021   Anxiety    COPD (chronic obstructive pulmonary disease) (HCC)    Depression    Dry eye syndrome    Dysrhythmia    hx of PAC's   Effusion of elbow joint, right 06/29/2017   Gastric ulcer with perforation but without obstruction (HCC) 04/15/2016   GERD (gastroesophageal reflux disease)    Gout    Hyperlipidemia    Hypertension    Hypothyroidism    OSA (obstructive sleep apnea) 02/18/2023   Papillary thyroid carcinoma s/p total thyroidectomy & CLND 08/27/2017 08/24/2017   Postsurgical hypothyroidism 10/01/2017   Thyroid cancer (HCC)    2018   Past Surgical History:  Procedure Laterality Date   ABDOMINAL SURGERY     hemmoraging ulcer   BRONCHIAL BIOPSY  06/25/2020   Procedure: BRONCHIAL BIOPSIES;  Surgeon: [name], MD;  Location: [facility] ENDOSCOPY;  Service: Pulmonary;;   BRONCHIAL BRUSHINGS  06/25/2020   Procedure: BRONCHIAL BRUSHINGS;  Surgeon: [name], MD;  Location: [facility]  ENDOSCOPY;  Service: Pulmonary;;   BRONCHIAL NEEDLE ASPIRATION BIOPSY  06/25/2020   Procedure: BRONCHIAL NEEDLE ASPIRATION BIOPSIES;  Surgeon: [name], MD;  Location: [facility] ENDOSCOPY;  Service: Pulmonary;;   BRONCHIAL WASHINGS  06/25/2020   Procedure: BRONCHIAL WASHINGS;  Surgeon: [name], MD;  Location: [facility] ENDOSCOPY;  Service: Pulmonary;;   CATARACT EXTRACTION Right    COLONOSCOPY     EYE SURGERY Right    x 4  between ages 70-13   FIDUCIAL MARKER PLACEMENT  06/25/2020   Procedure: FIDUCIAL MARKER PLACEMENT;  Surgeon: [name], MD;  Location: [facility] ENDOSCOPY;  Service: Pulmonary;;   THYROIDECTOMY N/A 08/27/2017   Procedure: TOTAL THYROIDECTOMY WITH LIMITED LYMPH NODE DISSECTION;  Surgeon: [name], MD;  Location: [facility] ORS;  Service: General;  Laterality: N/A;   UPPER GI ENDOSCOPY     VIDEO BRONCHOSCOPY WITH ENDOBRONCHIAL NAVIGATION N/A 06/25/2020   Procedure: VIDEO BRONCHOSCOPY WITH ENDOBRONCHIAL NAVIGATION;  Surgeon: [name], MD;  Location: [facility] ENDOSCOPY;  Service: Pulmonary;  Laterality: N/A;   Patient Active Problem List   Diagnosis Date Noted   Sleep apnea-like behavior 04/08/2023   Excessive daytime sleepiness 02/18/2023   History of gastric ulcer 02/18/2023   HTN (hypertension), benign 02/18/2023   Hx of papillary thyroid carcinoma 02/18/2023   Hypercholesteremia 02/18/2023  Low back pain 02/18/2023   Major depression, single episode 02/18/2023   Morbid obesity (HCC) 02/18/2023   Nodule of left lung 02/18/2023   OSA (obstructive sleep apnea) 02/18/2023   Non-restorative sleep 02/18/2023   Penetrating ulcer of aorta (HCC) 02/18/2023   Protrusion of intervertebral disc of thoracic region 02/18/2023   Seborrheic dermatitis 02/18/2023   Vitamin D deficiency 02/18/2023   Late effect of cerebrovascular accident (CVA) 01/05/2023   Dyslipidemia 01/05/2023   History of smoking 01/05/2023   Age-related osteoporosis without current pathological fracture  12/12/2021   Postsurgical hypothyroidism 10/01/2017   Papillary thyroid carcinoma s/p total thyroidectomy & CLND 08/27/2017 08/24/2017   Adenocarcinoma, lung, left (HCC) 07/05/2017   COPD (chronic obstructive pulmonary disease) (HCC) 07/05/2017   Effusion of elbow joint, right 06/29/2017   Gastric ulcer with perforation but without obstruction (HCC) 04/15/2016    PCP: [name], MD   REFERRING PROVIDER: [name], MD   REFERRING DIAG: R53.81 (ICD-10-CM) - Declining functional status   THERAPY DIAG:  Unsteadiness on feet  Other abnormalities of gait and mobility  Muscle weakness (generalized)  History of falling  RATIONALE FOR EVALUATION AND TREATMENT: Rehabilitation  ONSET DATE: December 2023  NEXT MD VISIT: Unknown   SUBJECTIVE:                                                                                                                                                                                                         SUBJECTIVE STATEMENT: Mcgregor reports his back always hurts in the morning.  He feels like he is doing better but his balance is still not good, especially with his eyes closed.  PAIN: Are you having pain? 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   PRECAUTIONS: Other: Implanted loop recorder & completely blind in his right eye    RED FLAGS: None  WEIGHT BEARING RESTRICTIONS: No  FALLS:  Has patient fallen in last 6 months? No and Yes. Number of falls at least 6 in April/May per documentation in recent PT episode, however patient denies any recent falls  LIVING ENVIRONMENT:  Lives with: lives alone Lives in:  House/apartment Stairs: No Has following equipment at home: Single point cane, Environmental consultant - 2 wheeled, shower chair, and Grab bars  OCCUPATION: Retired  PLOF: Independent  and Leisure: watching TV, mostly sedentary   PATIENT GOALS: "Strengthening my knees and improve my balance."   OBJECTIVE: (objective measures completed at initial evaluation unless otherwise dated)  DIAGNOSTIC FINDINGS:  04/06/23 - CT angio head & neck: IMPRESSION: 1. No large vessel occlusion. 2. Severe stenosis of the right vertebral artery origin. 3. Approximately 50% stenosis of the proximal left ICA in the neck. 4. Left upper lobe treatment change, incompletely imaged and not well assessed on this study. Please see prior CT chest. 5. Bulky bridging anterior osteophytes at multiple cervical levels.  04/09/23 - CT chest: IMPRESSION: 1. Unchanged post treatment/post radiation appearance of the anterior left upper lobe with irregular scarring and volume loss with biopsy marking clips and or fiducial markers. Most discrete residual nodule anteriorly measures 1.1 x 0.8 cm, unchanged. 2. No evidence of recurrent or metastatic disease in the chest. 3. Mildly coarse contour of the liver, suggestive of cirrhosis.  11/07/22 - MRI brain: IMPRESSION: 1. Positive for a small subacute lacunar infarct in the Right cerebellum. No associated hemorrhage or mass effect. And underlying chronic small vessel ischemic disease in the cerebral white matter and basal ganglia. 2. No metastatic disease identified. 3. Bulky degenerative anterior cervical spine spurring at C3-C4.  COGNITION: Overall cognitive status: Impaired - reports STM deficits   SENSATION: WFL  COORDINATION: Grossly WFL  MUSCLE LENGTH: Hamstrings: Mod tight B ITB: Mild/mod tight B Piriformis: Mod/severe tight B Hip flexors: Mod tight B Quads: Mod tight B Heelcord: NT  POSTURE:  rounded shoulders, forward head, and wide BOS  LOWER EXTREMITY ROM:    B hips  limited due to muscle tightness with B knee extension limited by HS tightness and weakness Active  Right Eval Left Eval  Knee flexion 128 132  Knee extension -9 LAQ / 0 supine -28 LAQ / 0 supine    LOWER EXTREMITY MMT:    MMT Right eval Left eval R 08/17/23 L 08/17/23  Hip flexion 4 4 4+ 4+  Hip extension 3+ 4- 4 4+  Hip abduction 4- 4- 4 4  Hip adduction 4 4 4+ 4+  Hip internal rotation 4 4 5 5   Hip external rotation 4 4 4+ 4+  Knee flexion 4+ 5 5 5   Knee extension 4 4 5 5   Ankle dorsiflexion 4+ 5 5 5   Ankle plantarflexion      Ankle inversion      Ankle eversion      (Blank rows = not tested)  BED MOBILITY:  Grossly independent  TRANSFERS: Assistive device utilized: None  Sit to stand: Complete Independence Stand to sit: Complete Independence Chair to chair:  NT Floor:  NT  GAIT: Distance walked: Clinic distances Assistive device utilized: None Level of assistance: Complete Independence and SBA Gait pattern: step through pattern, decreased arm swing- Right, decreased arm swing- Left, shuffling, wide BOS, poor foot clearance- Right, and poor foot clearance- Left Comments: Occasional staggering due to decreased foot clearance  STAIRS:  Level of Assistance: SBA  Stair Negotiation Technique: Alternating Pattern  with Single Rail on Right  Number of Stairs: 14   Height of Stairs: 7"  Comments: Inconsistent foot clearance on step with heels dragging down edge of step on some steps on descent  FUNCTIONAL TESTS:  5 times sit to stand: 15.21 sec w/o UE assist Timed up and go (TUG): 10.47 sec w/o AD 10 meter walk test: 9.25 sec; Gait speed: 3.55 ft/sec  Berg Balance Scale: 49/56 Functional gait assessment: 18/30; < 19 =  high risk fall   08/13/23 - 5xSTS = 14.37 sec w/o UE assist  08/24/23: 5xSTS = 12.69 sec w/o UE assist Berg = 56/56 FGA = 23/30; 19-24 = medium risk fall  = 8.31 sec Gait speed = 3.95 ft/sec - normal gait pattern  PATIENT SURVEYS:  ABC scale  1390 / 1600 = 86.9 % ; 08/24/23 - 1450 / 1600 = 90.6 %   TODAY'S TREATMENT:   08/24/23 THERAPEUTIC EXERCISE: to improve flexibility, strength and mobility.  Demonstration, verbal and tactile cues throughout for technique.  NuStep - L6 x 6 min (UE/LE)  THERAPEUTIC ACTIVITIES: ABC scale = 1450 / 1600 = 90.6 % 5xSTS = 12.69 sec w/o UE assist Berg = 56/56 FGA = 23/30 = 8.31 sec Gait speed = 3.95 ft/sec - normal gait pattern   08/20/23 THERAPEUTIC EXERCISE: to improve flexibility, strength and mobility.  Demonstration, verbal and tactile cues throughout for technique.  NuStep - L6 x 6 min (UE/LE) Supine LTR x 10 each way Supine HS stretch 2x30 sec with strap bil Supine quad stretch with strap 2x30 sec bil Supine hip ADD ball squeeze 10x3" Seated hamstring stretch 2x30 sec bil B side steps and monster walks RTB at ankles 4x12 ft Standing hip abd to ext RTB 4x12 ft   08/17/23 THERAPEUTIC EXERCISE: to improve flexibility, strength and mobility.  Demonstration, verbal and tactile cues throughout for technique.  NuStep - L6 x 6 min (UE/LE) R/L SLS + opp LE RTB 4-way SLR x 10 each, 2 pole A for balance with SBA of PT (demonstrated how to use chair for balance at home) B side-stepping with looped RTB at ankles 4 x 10 ft - hands on wall for balance Monster walk with looped RTB at ankles 4 x 10 ft forward, 2 x 10 ft backwards - hand on wall for balance  THERAPEUTIC ACTIVITIES: LE MMT   PATIENT EDUCATION:  Education details: progress with PT and ongoing PT POC  Person educated: Patient Education method: Explanation Education comprehension: verbalized understanding  HOME EXERCISE PROGRAM: Access Code: [email or web address] URL: [email or web address] Date: 08/20/2023 Prepared by: Verta Ellen  Exercises - Squat with Chair Touch  - 1 x daily - 7 x weekly - 2 sets - 10 reps - Heel Toe Raises with Counter Support  - 1 x daily - 7 x weekly - 2 sets - 10 reps - Tandem Stance in  Corner  - 1 x daily - 7 x weekly - 3 sets - 10 reps - Corner Balance   - 1 x daily - 7 x weekly - 3 sets - 10 reps - Supine Lower Trunk Rotation  - 2 x daily - 7 x weekly - 3 sets - 10 reps - Supine Quadriceps Stretch with Strap on Table  - 2 x daily - 7 x weekly - 3 sets - 3 reps - 30 sec hold - Supine Single Knee to Chest Stretch  - 2 x daily - 7 x weekly - 3 sets - Supine Piriformis Stretch with Foot on Ground  - 2 x daily - 7 x weekly - 3 sets - 30 sec hold - Supine Figure 4 Piriformis Stretch  - 2 x daily - 7 x weekly - 3 sets - 30 sec hold - Supine Hip Adduction Isometric with Ball  - 1 x daily - 7 x weekly - 3 sets - 10 reps - 3 sec hold - Seated Hamstring Stretch  - 2 x daily - 7 x weekly - 3  sets - 30 sec hold - Standing Hip Flexion with Anchored Resistance and Chair Support  - 1 x daily - 3-4 x weekly - 2 sets - 10 reps - 3 sec hold - Standing Hip Adduction with Anchored Resistance  - 1 x daily - 3-4 x weekly - 2 sets - 10 reps - 3 sec hold - Standing Hip Extension with Anchored Resistance  - 1 x daily - 3-4 x weekly - 2 sets - 10 reps - 3 sec hold - Standing Hip Abduction with Anchored Resistance  - 1 x daily - 3-4 x weekly - 2 sets - 10 reps - 3 sec hold - Side Stepping with Resistance at Ankles and Counter Support  - 1 x daily - 3-4 x weekly - 2 sets - 10 reps - 3 sec hold - Forward and Backward Monster Walk with Resistance at Ankles and Counter Support  - 1 x daily - 3-4 x weekly - 2 sets - 10 reps - 3 sec hold   ASSESSMENT:  CLINICAL IMPRESSION: Jaalen is demonstrating good progress with PT with gains noted in overall B LE strength as well as across all standardized balance testing, with perfect score achieved on Berg today and FGA score improved from high fall risk to medium fall risk.  He continues to note difficulty with static balance during activities where eyes are closed such as standing in the shower to wash his hair and finds related activities during corner balance  training to be challenging.  He continues to demonstrate mild proximal LE weakness.  Vanna is interested in continuing with PT to continue to work on his balance and will benefit from continued skilled PT to address ongoing strength and balance deficits to improve mobility and activity tolerance with decreased pain interference, therefore will recommend recert for continued skilled PT 1 x/wk x 4 weeks,.   OBJECTIVE IMPAIRMENTS: Abnormal gait, decreased activity tolerance, decreased balance, decreased coordination, decreased endurance, decreased knowledge of condition, decreased knowledge of use of DME, decreased mobility, difficulty walking, decreased ROM, decreased strength, decreased safety awareness, impaired flexibility, improper body mechanics, postural dysfunction, and pain.   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   REHAB POTENTIAL: Good  CLINICAL DECISION MAKING: Stable/uncomplicated  EVALUATION COMPLEXITY: Low   GOALS: Goals reviewed with patient? Yes  SHORT TERM GOALS: Target date: 08/04/2023  Patient will be independent with initial HEP to improve outcomes and carryover.  Baseline: given 08/04/23 Goal status: MET - 08/10/23  2.  Patient will be educated on strategies to decrease risk of falls.  Baseline:  Goal status: MET - 08/06/23  3.  Patient will improve 5x STS time to </= 12 seconds for improved efficiency and safety with transfers. Baseline:  15.21 sec w/o UE assist Goal status: IN PROGRESS - 08/24/23 - 12.69 sec  LONG TERM GOALS: Target date: 08/25/2023, extended to 09/21/2023  Patient will be independent with advanced/ongoing HEP to facilitate ability to maintain/progress functional gains from skilled physical therapy services. Baseline:  Goal status: IN PROGRESS - 08/17/23 - Met for current HEP, but updated today  2.  Patient will be able to ambulate without gait deviations or staggering with or w/o LRAD on variable surfaces with good safety to access community.  Baseline: Inconsistent foot clearance leading to occasional stagger Goal status: MET - 08/24/23  3.  Patient will be able to step up/down curb safely with LRAD for safety with community ambulation.  Baseline:  Goal status: MET - 08/24/23   4.  Patient will demonstrate improved B LE strength to >/= 4+/5 for improved stability and ease of mobility . Baseline: Refer to above MMT table Goal status: IN PROGRESS - 08/17/23 - Met except B hip ABD and R hip extension 4/5  5.  Patient will improve Berg score by >/= 8 points or to >/= 52/56 to improve safety and stability with ADLs in standing and reduce risk for falls. (MCID= 8 points)  Baseline: 49/56 Goal status: MET - 08/24/23 - 56/56  6. Patient will improve FGA score to at least 22/30 to improve gait stability and reduce risk for falls. Baseline: 18/30 Goal status: MET & REVISED (See LTG 6a below) - 08/24/23 - 23/30  6a.  Patient will improve FGA score to at least 25/30 to improve gait stability and reduce risk for falls to low fall risk. Baseline: 18/30 on eval, 23/30 on 08/24/23  Goal status: REVISED   7.  Patient will report >/= 90% on ABC scale to demonstrate improved functional ability. Baseline: 1390 / 1600 = 86.9 % Goal status: MET - 08/24/23 - 1450 / 1600 = 90.6 %   PLAN:  PT FREQUENCY: 1x/week  PT DURATION: 4 weeks  PLANNED INTERVENTIONS: Therapeutic exercises, Therapeutic activity, Neuromuscular  re-education, Balance training, Gait training, Patient/Family education, Self Care, Joint mobilization, Stair training, DME instructions, Cryotherapy, Moist heat, Taping, Manual therapy, and Re-evaluation  PLAN FOR NEXT SESSION: LE flexibility/stretching; core/lumbopelvic and LE strengthening (esp hip extension); balance training; dynamic gait training   Marry Guan, PT 08/24/2023, 4:56 PM

## 2023-08-31 ENCOUNTER — Ambulatory Visit: Payer: Medicare HMO | Attending: Family Medicine

## 2023-08-31 DIAGNOSIS — R2689 Other abnormalities of gait and mobility: Secondary | ICD-10-CM | POA: Insufficient documentation

## 2023-08-31 DIAGNOSIS — R2681 Unsteadiness on feet: Secondary | ICD-10-CM | POA: Diagnosis not present

## 2023-08-31 DIAGNOSIS — M6281 Muscle weakness (generalized): Secondary | ICD-10-CM | POA: Diagnosis not present

## 2023-08-31 DIAGNOSIS — Z9181 History of falling: Secondary | ICD-10-CM | POA: Diagnosis not present

## 2023-08-31 NOTE — Therapy (Signed)
OUTPATIENT PHYSICAL THERAPY TREATMENT       Patient Name: Timothy Conway MRN: [id number] DOB:[birth year], 67 y.o., male Today's Date: 08/31/2023   END OF SESSION:  PT End of Session - 08/31/23 1102     Visit Number 9    Date for PT Re-Evaluation 09/21/23    Authorization Type [employer]    Authorization Time Period 10    Authorization - Number of Visits 10    Progress Note Due on Visit 18   Recert on visit #8 - 08/24/23   PT Start Time 1018    PT Stop Time 1059    PT Time Calculation (min) 41 min    Activity Tolerance Patient tolerated treatment well    Behavior During Therapy WFL for tasks assessed/performed                    Past Medical History:  Diagnosis Date   Adenocarcinoma, lung, left (HCC) 07/05/2017   Age-related osteoporosis without current pathological fracture 12/12/2021   Anxiety    COPD (chronic obstructive pulmonary disease) (HCC)    Depression    Dry eye syndrome    Dysrhythmia    hx of PAC's   Effusion of elbow joint, right 06/29/2017   Gastric ulcer with perforation but without obstruction (HCC) 04/15/2016   GERD (gastroesophageal reflux disease)    Gout    Hyperlipidemia    Hypertension    Hypothyroidism    OSA (obstructive sleep apnea) 02/18/2023   Papillary thyroid carcinoma s/p total thyroidectomy & CLND 08/27/2017 08/24/2017   Postsurgical hypothyroidism 10/01/2017   Thyroid cancer (HCC)    2018   Past Surgical History:  Procedure Laterality Date   ABDOMINAL SURGERY     hemmoraging ulcer   BRONCHIAL BIOPSY  06/25/2020   Procedure: BRONCHIAL BIOPSIES;  Surgeon: [name], MD;  Location: [facility] ENDOSCOPY;  Service: Pulmonary;;   BRONCHIAL BRUSHINGS  06/25/2020   Procedure: BRONCHIAL BRUSHINGS;  Surgeon: [name], MD;  Location: [facility] ENDOSCOPY;  Service: Pulmonary;;   BRONCHIAL NEEDLE ASPIRATION BIOPSY  06/25/2020   Procedure: BRONCHIAL NEEDLE ASPIRATION BIOPSIES;  Surgeon: [name], MD;  Location: [facility] ENDOSCOPY;   Service: Pulmonary;;   BRONCHIAL WASHINGS  06/25/2020   Procedure: BRONCHIAL WASHINGS;  Surgeon: [name], MD;  Location: [facility] ENDOSCOPY;  Service: Pulmonary;;   CATARACT EXTRACTION Right    COLONOSCOPY     EYE SURGERY Right    x 4  between ages 78-13   FIDUCIAL MARKER PLACEMENT  06/25/2020   Procedure: FIDUCIAL MARKER PLACEMENT;  Surgeon: [name], MD;  Location: [facility] ENDOSCOPY;  Service: Pulmonary;;   THYROIDECTOMY N/A 08/27/2017   Procedure: TOTAL THYROIDECTOMY WITH LIMITED LYMPH NODE DISSECTION;  Surgeon: [name], MD;  Location: [facility] ORS;  Service: General;  Laterality: N/A;   UPPER GI ENDOSCOPY     VIDEO BRONCHOSCOPY WITH ENDOBRONCHIAL NAVIGATION N/A 06/25/2020   Procedure: VIDEO BRONCHOSCOPY WITH ENDOBRONCHIAL NAVIGATION;  Surgeon: [name], MD;  Location: [facility] ENDOSCOPY;  Service: Pulmonary;  Laterality: N/A;   Patient Active Problem List   Diagnosis Date Noted   Sleep apnea-like behavior 04/08/2023   Excessive daytime sleepiness 02/18/2023   History of gastric ulcer 02/18/2023   HTN (hypertension), benign 02/18/2023   Hx of papillary thyroid carcinoma 02/18/2023   Hypercholesteremia 02/18/2023   Low back pain 02/18/2023   Major depression, single episode 02/18/2023   Morbid obesity (HCC) 02/18/2023   Nodule of left lung 02/18/2023   OSA (obstructive  sleep apnea) 02/18/2023   Non-restorative sleep 02/18/2023   Penetrating ulcer of aorta (HCC) 02/18/2023   Protrusion of intervertebral disc of thoracic region 02/18/2023   Seborrheic dermatitis 02/18/2023   Vitamin D deficiency 02/18/2023   Late effect of cerebrovascular accident (CVA) 01/05/2023   Dyslipidemia 01/05/2023   History of smoking 01/05/2023   Age-related osteoporosis without current pathological fracture 12/12/2021   Postsurgical hypothyroidism 10/01/2017   Papillary thyroid carcinoma s/p total thyroidectomy & CLND 08/27/2017 08/24/2017   Adenocarcinoma, lung, left (HCC) 07/05/2017   COPD (chronic  obstructive pulmonary disease) (HCC) 07/05/2017   Effusion of elbow joint, right 06/29/2017   Gastric ulcer with perforation but without obstruction (HCC) 04/15/2016    PCP: [name], MD   REFERRING PROVIDER: [name], MD   REFERRING DIAG: R53.81 (ICD-10-CM) - Declining functional status   THERAPY DIAG:  Unsteadiness on feet  Other abnormalities of gait and mobility  Muscle weakness (generalized)  History of falling  RATIONALE FOR EVALUATION AND TREATMENT: Rehabilitation  ONSET DATE: December 2023  NEXT MD VISIT: Unknown   SUBJECTIVE:                                                                                                                                                                                                         SUBJECTIVE STATEMENT: Gamble reports the usual soreness in hips.  PAIN: Are you having pain? 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   PRECAUTIONS: Other: Implanted loop recorder & completely blind in his right eye    RED FLAGS: None  WEIGHT BEARING RESTRICTIONS: No  FALLS:  Has patient fallen in last 6 months? No and Yes. Number of falls at least 6 in April/May per documentation in recent PT episode, however patient denies any recent falls  LIVING ENVIRONMENT:  Lives with: lives alone Lives in: House/apartment Stairs: No Has following equipment at home: Single point cane, Environmental consultant - 2 wheeled, shower chair, and Grab bars  OCCUPATION: Retired  PLOF: Independent and Leisure: watching TV, mostly sedentary   PATIENT GOALS: "Strengthening my knees and improve my balance."   OBJECTIVE: (objective measures completed  at initial evaluation unless otherwise dated)  DIAGNOSTIC FINDINGS:  04/06/23 - CT angio head & neck: IMPRESSION: 1. No large vessel occlusion. 2. Severe stenosis of  the right vertebral artery origin. 3. Approximately 50% stenosis of the proximal left ICA in the neck. 4. Left upper lobe treatment change, incompletely imaged and not well assessed on this study. Please see prior CT chest. 5. Bulky bridging anterior osteophytes at multiple cervical levels.  04/09/23 - CT chest: IMPRESSION: 1. Unchanged post treatment/post radiation appearance of the anterior left upper lobe with irregular scarring and volume loss with biopsy marking clips and or fiducial markers. Most discrete residual nodule anteriorly measures 1.1 x 0.8 cm, unchanged. 2. No evidence of recurrent or metastatic disease in the chest. 3. Mildly coarse contour of the liver, suggestive of cirrhosis.  11/07/22 - MRI brain: IMPRESSION: 1. Positive for a small subacute lacunar infarct in the Right cerebellum. No associated hemorrhage or mass effect. And underlying chronic small vessel ischemic disease in the cerebral white matter and basal ganglia. 2. No metastatic disease identified. 3. Bulky degenerative anterior cervical spine spurring at C3-C4.  COGNITION: Overall cognitive status: Impaired - reports STM deficits   SENSATION: WFL  COORDINATION: Grossly WFL  MUSCLE LENGTH: Hamstrings: Mod tight B ITB: Mild/mod tight B Piriformis: Mod/severe tight B Hip flexors: Mod tight B Quads: Mod tight B Heelcord: NT  POSTURE:  rounded shoulders, forward head, and wide BOS  LOWER EXTREMITY ROM:    B hips limited due to muscle tightness with B knee extension limited by HS tightness and weakness Active  Right Eval Left Eval  Knee flexion 128 132  Knee extension -9 LAQ / 0 supine -28 LAQ / 0 supine    LOWER EXTREMITY MMT:    MMT Right eval Left eval R 08/17/23 L 08/17/23  Hip flexion 4 4 4+ 4+  Hip extension 3+ 4- 4 4+  Hip  abduction 4- 4- 4 4  Hip adduction 4 4 4+ 4+  Hip internal rotation 4 4 5 5   Hip external rotation 4 4 4+ 4+  Knee flexion 4+ 5 5 5   Knee extension 4 4 5 5   Ankle dorsiflexion 4+ 5 5 5   Ankle plantarflexion      Ankle inversion      Ankle eversion      (Blank rows = not tested)  BED MOBILITY:  Grossly independent  TRANSFERS: Assistive device utilized: None  Sit to stand: Complete Independence Stand to sit: Complete Independence Chair to chair:  NT Floor:  NT  GAIT: Distance walked: Clinic distances Assistive device utilized: None Level of assistance: Complete Independence and SBA Gait pattern: step through pattern, decreased arm swing- Right, decreased arm swing- Left, shuffling, wide BOS, poor foot clearance- Right, and poor foot clearance- Left Comments: Occasional staggering due to decreased foot clearance  STAIRS:  Level of Assistance: SBA  Stair Negotiation Technique: Alternating Pattern  with Single Rail on Right  Number of Stairs: 14   Height of Stairs: 7"  Comments: Inconsistent foot clearance on step with heels dragging down edge of step on some steps on descent  FUNCTIONAL TESTS:  5 times sit to stand: 15.21 sec w/o UE assist Timed up and go (TUG): 10.47 sec w/o AD 10 meter walk test: 9.25 sec; Gait speed: 3.55 ft/sec  Berg Balance Scale: 49/56 Functional gait assessment: 18/30; < 19 = high risk fall   08/13/23 - 5xSTS = 14.37 sec w/o UE assist  08/24/23: 5xSTS = 12.69 sec w/o UE assist Berg = 56/56 FGA = 23/30; 19-24 = medium risk fall  = 8.31 sec Gait speed = 3.95 ft/sec - normal gait pattern  PATIENT SURVEYS:  ABC scale 1390 / 1600 = 86.9 % ; 08/24/23 - 1450 / 1600 = 90.6 %   TODAY'S TREATMENT:  08/31/23 THERAPEUTIC EXERCISE: to improve flexibility, strength and mobility.  Demonstration, verbal and tactile cues throughout for technique.  NuStep - L6 x 6 min (UE/LE) Seated on dyna-disk: --I'm a winner with blue weight ball x 10 feet together   --Side to side with blue weight ball x 10 feet together Rows GTB x 10 feet together Pallof press GTB (doubled) x 10 each direction  Stepping strategy with bil arm raise x 10 Toe taps hitting colored dots fwd and lateral both ways --Progressed to calling out specific colors with specific LE to incorporate cognitive aspect Progressed toe taps hitting colored cones for increased hip ROM fwd and lateral both ways --Progressed to calling out specific colors with specific LE to incorporate cognitive aspect  08/24/23 THERAPEUTIC EXERCISE: to improve flexibility, strength and mobility.  Demonstration, verbal and tactile cues throughout for technique.  NuStep - L6 x 6 min (UE/LE)  THERAPEUTIC ACTIVITIES: ABC scale = 1450 / 1600 = 90.6 % 5xSTS = 12.69 sec w/o UE assist Berg = 56/56 FGA = 23/30 = 8.31 sec Gait speed = 3.95 ft/sec - normal gait pattern   08/20/23 THERAPEUTIC EXERCISE: to improve flexibility, strength and mobility.  Demonstration, verbal and tactile cues throughout for technique.  NuStep - L6 x 6 min (UE/LE) Supine LTR x 10 each way Supine HS stretch 2x30 sec with strap bil Supine quad stretch with strap 2x30 sec bil Supine hip ADD ball squeeze 10x3" Seated hamstring stretch 2x30 sec bil B side steps and monster walks RTB at ankles 4x12 ft Standing hip abd to ext RTB 4x12 ft   08/17/23 THERAPEUTIC EXERCISE: to improve flexibility, strength and mobility.  Demonstration, verbal and tactile cues throughout for technique.  NuStep - L6 x 6 min (UE/LE) R/L SLS + opp LE RTB 4-way SLR x 10 each, 2 pole A for balance with SBA of PT (demonstrated how to use chair for balance at home) B side-stepping with looped RTB at ankles 4 x 10 ft - hands on wall for balance Monster walk with looped RTB at ankles 4 x 10 ft forward, 2 x 10 ft backwards - hand on wall for balance  THERAPEUTIC ACTIVITIES: LE MMT   PATIENT EDUCATION:  Education details: progress with PT and ongoing PT  POC  Person educated: Patient Education method: Explanation Education comprehension: verbalized understanding  HOME EXERCISE PROGRAM: Access Code: [email or web address] URL: [email or web address] Date: 08/20/2023 Prepared by: Verta Ellen  Exercises - Squat with Chair Touch  - 1 x daily - 7 x weekly - 2 sets - 10 reps - Heel Toe Raises with Counter Support  - 1 x daily - 7 x weekly - 2 sets - 10 reps - Tandem Stance in Corner  - 1 x daily - 7 x weekly - 3 sets - 10 reps - Corner Balance   - 1 x daily - 7 x weekly - 3 sets - 10 reps - Supine Lower Trunk Rotation  - 2 x daily - 7 x weekly - 3 sets - 10 reps - Supine Quadriceps Stretch with Strap on Table  - 2 x daily - 7 x weekly - 3 sets - 3 reps - 30 sec hold - Supine Single Knee to Chest Stretch  - 2 x daily - 7 x weekly - 3 sets - Supine Piriformis Stretch with Foot on Ground  -  2 x daily - 7 x weekly - 3 sets - 30 sec hold - Supine Figure 4 Piriformis Stretch  - 2 x daily - 7 x weekly - 3 sets - 30 sec hold - Supine Hip Adduction Isometric with Ball  - 1 x daily - 7 x weekly - 3 sets - 10 reps - 3 sec hold - Seated Hamstring Stretch  - 2 x daily - 7 x weekly - 3 sets - 30 sec hold - Standing Hip Flexion with Anchored Resistance and Chair Support  - 1 x daily - 3-4 x weekly - 2 sets - 10 reps - 3 sec hold - Standing Hip Adduction with Anchored Resistance  - 1 x daily - 3-4 x weekly - 2 sets - 10 reps - 3 sec hold - Standing Hip Extension with Anchored Resistance  - 1 x daily - 3-4 x weekly - 2 sets - 10 reps - 3 sec hold - Standing Hip Abduction with Anchored Resistance  - 1 x daily - 3-4 x weekly - 2 sets - 10 reps - 3 sec hold - Side Stepping with Resistance at Ankles and Counter Support  - 1 x daily - 3-4 x weekly - 2 sets - 10 reps - 3 sec hold - Forward and Backward Monster Walk with Resistance at Ankles and Counter Support  - 1 x daily - 3-4 x weekly - 2 sets - 10 reps - 3 sec hold   ASSESSMENT:  CLINICAL  IMPRESSION: Progressed balance activities to tolerance and core strengthening. Pt demonstrates more difficulty with adduction (crossing legs) when tapping cones and circles on the floor. He also showed difficulty with keep stable core and pushed to the R side requiring cues to even out his weight. Latravis would continue to work on his balance and will benefit from continued skilled PT to address ongoing strength and balance deficits to improve mobility and activity tolerance with decreased pain interference.  OBJECTIVE IMPAIRMENTS: Abnormal gait, decreased activity tolerance, decreased balance, decreased coordination, decreased endurance, decreased knowledge of condition, decreased knowledge of use of DME, decreased mobility, difficulty walking, decreased ROM, decreased strength, decreased safety awareness, impaired flexibility, improper body mechanics, postural dysfunction, and pain.   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   REHAB POTENTIAL: Good  CLINICAL DECISION MAKING: Stable/uncomplicated  EVALUATION COMPLEXITY: Low   GOALS: Goals reviewed with patient? Yes  SHORT TERM GOALS: Target date: 08/04/2023  Patient will be independent with initial HEP to improve outcomes and carryover.  Baseline: given 08/04/23 Goal status: MET - 08/10/23  2.  Patient will be educated on strategies to decrease risk of falls.  Baseline:  Goal status: MET - 08/06/23  3.  Patient  will improve 5x STS time to </= 12 seconds for improved efficiency and safety with transfers. Baseline: 15.21 sec w/o UE assist Goal status: IN PROGRESS - 08/24/23 - 12.69 sec  LONG TERM GOALS: Target date: 08/25/2023, extended to 09/21/2023  Patient will be independent with advanced/ongoing HEP to facilitate ability to maintain/progress functional gains from skilled physical therapy services. Baseline:  Goal status: IN  PROGRESS - 08/17/23 - Met for current HEP, but updated today  2.  Patient will be able to ambulate without gait deviations or staggering with or w/o LRAD on variable surfaces with good safety to access community.  Baseline: Inconsistent foot clearance leading to occasional stagger Goal status: MET - 08/24/23  3.  Patient will be able to step up/down curb safely with LRAD for safety with community ambulation.  Baseline:  Goal status: MET - 08/24/23   4.  Patient will demonstrate improved B LE strength to >/= 4+/5 for improved stability and ease of mobility . Baseline: Refer to above MMT table Goal status: IN PROGRESS - 08/17/23 - Met except B hip ABD and R hip extension 4/5  5.  Patient will improve Berg score by >/= 8 points or to >/= 52/56 to improve safety and stability with ADLs in standing and reduce risk for falls. (MCID= 8 points)  Baseline: 49/56 Goal status: MET - 08/24/23 - 56/56  6. Patient will improve FGA score to at least 22/30 to improve gait stability and reduce risk for falls. Baseline: 18/30 Goal status: MET & REVISED (See LTG 6a below) - 08/24/23 - 23/30  6a.  Patient will improve FGA score to at least 25/30 to improve gait stability and reduce risk for falls to low fall risk. Baseline: 18/30 on eval, 23/30 on 08/24/23  Goal status: REVISED   7.  Patient will report >/= 90% on ABC scale to demonstrate improved functional ability. Baseline: 1390 / 1600 = 86.9 % Goal status: MET - 08/24/23 - 1450 / 1600 = 90.6 %   PLAN:  PT FREQUENCY: 1x/week  PT  DURATION: 4 weeks  PLANNED INTERVENTIONS: Therapeutic exercises, Therapeutic activity, Neuromuscular re-education, Balance training, Gait training, Patient/Family education, Self Care, Joint mobilization, Stair training, DME instructions, Cryotherapy, Moist heat, Taping, Manual therapy, and Re-evaluation  PLAN FOR NEXT SESSION: LE flexibility/stretching; core/lumbopelvic and LE strengthening (esp hip extension); balance training; dynamic gait training   Darleene Cleaver, PTA 08/31/2023, 11:20 AM

## 2023-09-01 LAB — CUP PACEART REMOTE DEVICE CHECK
Date Time Interrogation Session: 20240930231130
Implantable Pulse Generator Implant Date: 20240514

## 2023-09-03 ENCOUNTER — Ambulatory Visit: Payer: Medicare HMO | Admitting: Internal Medicine

## 2023-09-06 ENCOUNTER — Ambulatory Visit (INDEPENDENT_AMBULATORY_CARE_PROVIDER_SITE_OTHER): Payer: Medicare HMO

## 2023-09-06 DIAGNOSIS — I639 Cerebral infarction, unspecified: Secondary | ICD-10-CM | POA: Diagnosis not present

## 2023-09-07 ENCOUNTER — Encounter: Payer: Self-pay | Admitting: Physical Therapy

## 2023-09-07 ENCOUNTER — Ambulatory Visit: Payer: Medicare HMO | Admitting: Physical Therapy

## 2023-09-07 DIAGNOSIS — R2681 Unsteadiness on feet: Secondary | ICD-10-CM

## 2023-09-07 DIAGNOSIS — Z9181 History of falling: Secondary | ICD-10-CM | POA: Diagnosis not present

## 2023-09-07 DIAGNOSIS — M6281 Muscle weakness (generalized): Secondary | ICD-10-CM

## 2023-09-07 DIAGNOSIS — R2689 Other abnormalities of gait and mobility: Secondary | ICD-10-CM | POA: Diagnosis not present

## 2023-09-07 NOTE — Therapy (Signed)
OUTPATIENT PHYSICAL THERAPY TREATMENT       Patient Name: Timothy Conway MRN: [id number] DOB:Apr 28, 1956, 67 y.o., male Today's Date: 09/07/2023   END OF SESSION:  PT End of Session - 09/07/23 1144     Visit Number 10    Date for PT Re-Evaluation 09/21/23    Authorization Type [employer]    Authorization Time Period 08/31/23 - 09/21/23    Authorization - Visit Number 2    Authorization - Number of Visits 8    Progress Note Due on Visit 18   Recert on visit #8 - 08/24/23   PT Start Time 1144    PT Stop Time 1230    PT Time Calculation (min) 46 min    Activity Tolerance Patient tolerated treatment well    Behavior During Therapy [facility] for tasks assessed/performed                    Past Medical History:  Diagnosis Date   Adenocarcinoma, lung, left (HCC) 07/05/2017   Age-related osteoporosis without current pathological fracture 12/12/2021   Anxiety    COPD (chronic obstructive pulmonary disease) (HCC)    Depression    Dry eye syndrome    Dysrhythmia    hx of PAC's   Effusion of elbow joint, right 06/29/2017   Gastric ulcer with perforation but without obstruction (HCC) 04/15/2016   GERD (gastroesophageal reflux disease)    Gout    Hyperlipidemia    Hypertension    Hypothyroidism    OSA (obstructive sleep apnea) 02/18/2023   Papillary thyroid carcinoma s/p total thyroidectomy & CLND 08/27/2017 08/24/2017   Postsurgical hypothyroidism 10/01/2017   Thyroid cancer (HCC)    2018   Past Surgical History:  Procedure Laterality Date   ABDOMINAL SURGERY     hemmoraging ulcer   BRONCHIAL BIOPSY  06/25/2020   Procedure: BRONCHIAL BIOPSIES;  Surgeon: [name], MD;  Location: [facility] ENDOSCOPY;  Service: Pulmonary;;   BRONCHIAL BRUSHINGS  06/25/2020   Procedure: BRONCHIAL BRUSHINGS;  Surgeon: [name], MD;  Location: [facility] ENDOSCOPY;  Service: Pulmonary;;   BRONCHIAL NEEDLE ASPIRATION BIOPSY  06/25/2020   Procedure: BRONCHIAL NEEDLE ASPIRATION BIOPSIES;  Surgeon:  [name], MD;  Location: [facility] ENDOSCOPY;  Service: Pulmonary;;   BRONCHIAL WASHINGS  06/25/2020   Procedure: BRONCHIAL WASHINGS;  Surgeon: [name], MD;  Location: [facility] ENDOSCOPY;  Service: Pulmonary;;   CATARACT EXTRACTION Right    COLONOSCOPY     EYE SURGERY Right    x 4  between ages 48-13   FIDUCIAL MARKER PLACEMENT  06/25/2020   Procedure: FIDUCIAL MARKER PLACEMENT;  Surgeon: [name], MD;  Location: [facility] ENDOSCOPY;  Service: Pulmonary;;   THYROIDECTOMY N/A 08/27/2017   Procedure: TOTAL THYROIDECTOMY WITH LIMITED LYMPH NODE DISSECTION;  Surgeon: [name], MD;  Location: [facility] ORS;  Service: General;  Laterality: N/A;   UPPER GI ENDOSCOPY     VIDEO BRONCHOSCOPY WITH ENDOBRONCHIAL NAVIGATION N/A 06/25/2020   Procedure: VIDEO BRONCHOSCOPY WITH ENDOBRONCHIAL NAVIGATION;  Surgeon: [name], MD;  Location: [facility] ENDOSCOPY;  Service: Pulmonary;  Laterality: N/A;   Patient Active Problem List   Diagnosis Date Noted   Sleep apnea-like behavior 04/08/2023   Excessive daytime sleepiness 02/18/2023   History of gastric ulcer 02/18/2023   HTN (hypertension), benign 02/18/2023   Hx of papillary thyroid carcinoma 02/18/2023   Hypercholesteremia 02/18/2023   Low back pain 02/18/2023   Major depression, single episode 02/18/2023   Morbid obesity (HCC) 02/18/2023  Nodule of left lung 02/18/2023   OSA (obstructive sleep apnea) 02/18/2023   Non-restorative sleep 02/18/2023   Penetrating ulcer of aorta (HCC) 02/18/2023   Protrusion of intervertebral disc of thoracic region 02/18/2023   Seborrheic dermatitis 02/18/2023   Vitamin D deficiency 02/18/2023   Late effect of cerebrovascular accident (CVA) 01/05/2023   Dyslipidemia 01/05/2023   History of smoking 01/05/2023   Age-related osteoporosis without current pathological fracture 12/12/2021   Postsurgical hypothyroidism 10/01/2017   Papillary thyroid carcinoma s/p total thyroidectomy & CLND 08/27/2017 08/24/2017    Adenocarcinoma, lung, left (HCC) 07/05/2017   COPD (chronic obstructive pulmonary disease) (HCC) 07/05/2017   Effusion of elbow joint, right 06/29/2017   Gastric ulcer with perforation but without obstruction (HCC) 04/15/2016    PCP: [name], MD   REFERRING PROVIDER: [name], MD   REFERRING DIAG: R53.81 (ICD-10-CM) - Declining functional status   THERAPY DIAG:  Unsteadiness on feet  Other abnormalities of gait and mobility  Muscle weakness (generalized)  History of falling  RATIONALE FOR EVALUATION AND TREATMENT: Rehabilitation  ONSET DATE: December 2023  NEXT MD VISIT: Unknown   SUBJECTIVE:                                                                                                                                                                                                         SUBJECTIVE STATEMENT: Timothy Conway reports his everyday back pain is a little worse today due to the drop in temperature.  PAIN: Are you having pain? 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   PRECAUTIONS: Other: Implanted loop recorder & completely blind in his right eye    RED FLAGS: None  WEIGHT BEARING RESTRICTIONS: No  FALLS:  Has patient fallen in last 6 months? No and Yes. Number of falls at least 6 in April/May per documentation in recent PT episode, however patient denies any recent falls  LIVING ENVIRONMENT:  Lives with: lives alone Lives in: House/apartment Stairs: No Has following equipment at home: Single point cane, Environmental consultant - 2 wheeled, shower chair, and Grab bars  OCCUPATION: Retired  PLOF: Independent and Leisure: watching TV, mostly sedentary    PATIENT GOALS: "Strengthening my knees and improve my balance."   OBJECTIVE: (objective measures completed at initial evaluation unless otherwise dated)  DIAGNOSTIC FINDINGS:  04/06/23 - CT angio head & neck: IMPRESSION: 1. No large vessel occlusion. 2. Severe stenosis of the right vertebral artery origin. 3. Approximately 50% stenosis of the proximal left ICA in the neck. 4. Left upper lobe treatment change, incompletely imaged and not well assessed on this study. Please see prior CT chest. 5. Bulky bridging anterior osteophytes at multiple cervical levels.  04/09/23 - CT chest: IMPRESSION: 1. Unchanged post treatment/post radiation appearance of the anterior left upper lobe with irregular scarring and volume loss with biopsy marking clips and or fiducial markers. Most discrete residual nodule anteriorly measures 1.1 x 0.8 cm, unchanged. 2. No evidence of recurrent or metastatic disease in the chest. 3. Mildly coarse contour of the liver, suggestive of cirrhosis.  11/07/22 - MRI brain: IMPRESSION: 1. Positive for a small subacute lacunar infarct in the Right cerebellum. No associated hemorrhage or mass effect. And underlying chronic small vessel ischemic disease in the cerebral white matter and basal ganglia. 2. No metastatic disease identified. 3. Bulky degenerative anterior cervical spine spurring at C3-C4.  COGNITION: Overall cognitive status: Impaired - reports STM deficits   SENSATION: WFL  COORDINATION: Grossly WFL  MUSCLE LENGTH: Hamstrings: Mod tight B ITB: Mild/mod tight B Piriformis: Mod/severe tight B Hip flexors: Mod tight B Quads: Mod tight B Heelcord: NT  POSTURE:  rounded shoulders, forward head, and wide BOS  LOWER EXTREMITY ROM:    B hips limited due to muscle tightness with B knee extension limited by HS tightness and weakness Active  Right Eval Left Eval  Knee flexion 128 132  Knee extension -9 LAQ / 0 supine -28 LAQ / 0 supine    LOWER  EXTREMITY MMT:    MMT Right eval Left eval R 08/17/23 L 08/17/23  Hip flexion 4 4 4+ 4+  Hip extension 3+ 4- 4 4+  Hip abduction 4- 4- 4 4  Hip adduction 4 4 4+ 4+  Hip internal rotation 4 4 5 5   Hip external rotation 4 4 4+ 4+  Knee flexion 4+ 5 5 5   Knee extension 4 4 5 5   Ankle dorsiflexion 4+ 5 5 5   Ankle plantarflexion      Ankle inversion      Ankle eversion      (Blank rows = not tested)  BED MOBILITY:  Grossly independent  TRANSFERS: Assistive device utilized: None  Sit to stand: Complete Independence Stand to sit: Complete Independence Chair to chair:  NT Floor:  NT  GAIT: Distance walked: Clinic distances Assistive device utilized: None Level of assistance: Complete Independence and SBA Gait pattern: step through pattern, decreased arm swing- Right, decreased arm swing- Left, shuffling, wide BOS, poor foot clearance- Right, and poor foot clearance- Left Comments: Occasional staggering due to decreased foot clearance  STAIRS:  Level of Assistance: SBA  Stair Negotiation Technique: Alternating Pattern  with Single Rail on Right  Number of Stairs: 14   Height of Stairs: 7"  Comments: Inconsistent foot clearance on step with heels dragging down edge of step on some steps on descent  FUNCTIONAL TESTS:  5 times sit to stand: 15.21 sec w/o UE assist Timed up and go (TUG): 10.47 sec w/o AD 10 meter walk test: 9.25 sec; Gait speed: 3.55 ft/sec  Berg Balance Scale: 49/56 Functional gait assessment: 18/30; < 19 = high risk fall   08/13/23 - 5xSTS = 14.37 sec w/o UE assist  08/24/23: 5xSTS = 12.69 sec w/o UE assist Berg = 56/56 FGA = 23/30; 19-24 = medium  risk fall  = 8.31 sec Gait speed = 3.95 ft/sec - normal gait pattern  PATIENT SURVEYS:  ABC scale 1390 / 1600 = 86.9 % ; 08/24/23 - 1450 / 1600 = 90.6 %   TODAY'S TREATMENT:   09/07/23 THERAPEUTIC EXERCISE: to improve flexibility, strength and mobility.  Demonstration, verbal and tactile cues throughout  for technique.  NuStep - L6 x 6 min (UE/LE) Standing Fitter (1 black/1 blue) R/L hip abduction & extension x 10 each   NEUROMUSCULAR RE-EDUCATION: To improve posture, balance, proprioception, coordination, reduce fall risk, and amplitude of movement. CGA/SBA of PT via gait belt during all activities. Staggered stance with fwd foot on 2" book facing counter: Static stance x 15 sec bil Vertical head nods (eyes open & closed) x 10 each Standing on Airex pad facing counter: Overhead B UE reaches x 10 Overhead arc with B UE x 10 Lunge position with fwd reach/rock x 10 with each leg fwd - more difficult with R LE fwd Penguin lateral wt shift/rock with looped GTB at knees to point of fatigue (knees starting to bend) Penguin ant/post wt shift/rock with looped GTB at knees to point of fatigue - 1 set with each foot fwd High knee march with single UE support on counter x 10 Toe walking along counter 4 x 10 ft Fwd step + GTB B UE fwd press x 10 (band wrapped behind torso) x 10 alt feet Quick mini-hop with UE support on counter - unable to clear feet (mostly bouncing on heels)  THERAPEUTIC ACTIVITIES: 5xSTS = 13.10 sec   08/31/23 THERAPEUTIC EXERCISE: to improve flexibility, strength and mobility.  Demonstration, verbal and tactile cues throughout for technique.  NuStep - L6 x 6 min (UE/LE) Seated on dyna-disk: --I'm a winner with blue weight ball x 10 feet together  --Side to side with blue weight ball x 10 feet together Rows GTB x 10 feet together Pallof press GTB (doubled) x 10 each direction  Stepping strategy with bil arm raise x 10 Toe taps hitting colored dots fwd and lateral both ways --Progressed to calling out specific colors with specific LE to incorporate cognitive aspect Progressed toe taps hitting colored cones for increased hip ROM fwd and lateral both ways --Progressed to calling out specific colors with specific LE to incorporate cognitive aspect   08/24/23 THERAPEUTIC  EXERCISE: to improve flexibility, strength and mobility.  Demonstration, verbal and tactile cues throughout for technique.  NuStep - L6 x 6 min (UE/LE)  THERAPEUTIC ACTIVITIES: ABC scale = 1450 / 1600 = 90.6 % 5xSTS = 12.69 sec w/o UE assist Berg = 56/56 FGA = 23/30 = 8.31 sec Gait speed = 3.95 ft/sec - normal gait pattern   PATIENT EDUCATION:  Education details: progress with PT and ongoing PT POC  Person educated: Patient Education method: Explanation Education comprehension: verbalized understanding  HOME EXERCISE PROGRAM: Access Code: [email or web address] URL: [email or web address] Date: 08/20/2023 Prepared by: Verta Ellen  Exercises - Squat with Chair Touch  - 1 x daily - 7 x weekly - 2 sets - 10 reps - Heel Toe Raises with Counter Support  - 1 x daily - 7 x weekly - 2 sets - 10 reps - Tandem Stance in Corner  - 1 x daily - 7 x weekly - 3 sets - 10 reps - Corner Balance   - 1 x daily - 7 x weekly - 3 sets - 10 reps - Supine Lower Trunk Rotation  - 2 x daily - 7 x  weekly - 3 sets - 10 reps - Supine Quadriceps Stretch with Strap on Table  - 2 x daily - 7 x weekly - 3 sets - 3 reps - 30 sec hold - Supine Single Knee to Chest Stretch  - 2 x daily - 7 x weekly - 3 sets - Supine Piriformis Stretch with Foot on Ground  - 2 x daily - 7 x weekly - 3 sets - 30 sec hold - Supine Figure 4 Piriformis Stretch  - 2 x daily - 7 x weekly - 3 sets - 30 sec hold - Supine Hip Adduction Isometric with Ball  - 1 x daily - 7 x weekly - 3 sets - 10 reps - 3 sec hold - Seated Hamstring Stretch  - 2 x daily - 7 x weekly - 3 sets - 30 sec hold - Standing Hip Flexion with Anchored Resistance and Chair Support  - 1 x daily - 3-4 x weekly - 2 sets - 10 reps - 3 sec hold - Standing Hip Adduction with Anchored Resistance  - 1 x daily - 3-4 x weekly - 2 sets - 10 reps - 3 sec hold - Standing Hip Extension with Anchored Resistance  - 1 x daily - 3-4 x weekly - 2 sets - 10 reps - 3 sec hold -  Standing Hip Abduction with Anchored Resistance  - 1 x daily - 3-4 x weekly - 2 sets - 10 reps - 3 sec hold - Side Stepping with Resistance at Ankles and Counter Support  - 1 x daily - 3-4 x weekly - 2 sets - 10 reps - 3 sec hold - Forward and Backward Monster Walk with Resistance at Ankles and Counter Support  - 1 x daily - 3-4 x weekly - 2 sets - 10 reps - 3 sec hold   ASSESSMENT:  CLINICAL IMPRESSION: Meron notes some increased back pain today but does not feel it limits his mobility or activity tolerance.  Therapeutic interventions focusing on continued proximal LE stability and strengthening as well as balance training with static and dynamic activities. More difficulty noted with reliance on R LE for primary stability.  Christiopher would like to continue to work on his balance and will benefit from continued skilled PT to address ongoing strength and balance deficits to improve mobility and activity tolerance with decreased pain interference.  OBJECTIVE IMPAIRMENTS: Abnormal gait, decreased activity tolerance, decreased balance, decreased coordination, decreased endurance, decreased knowledge of condition, decreased knowledge of use of DME, decreased mobility, difficulty walking, decreased ROM, decreased strength, decreased safety awareness, impaired flexibility, improper body mechanics, postural dysfunction, and pain.   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    REHAB POTENTIAL: Good  CLINICAL DECISION MAKING: Stable/uncomplicated  EVALUATION COMPLEXITY: Low   GOALS: Goals reviewed with patient? Yes  SHORT TERM GOALS: Target date: 08/04/2023  Patient will be independent with initial HEP to improve outcomes and carryover.  Baseline: given 08/04/23 Goal status: MET - 08/10/23  2.  Patient will be educated on strategies to decrease risk of falls.  Baseline:  Goal status: MET -  08/06/23  3.  Patient will improve 5x STS time to </= 12 seconds for improved efficiency and safety with transfers. Baseline: 15.21 sec w/o UE assist; 08/24/23 - 12.69 sec Goal status: IN PROGRESS - 09/07/23 - 13.10 sec  LONG TERM GOALS: Target date: 08/25/2023, extended to 09/21/2023  Patient will be independent with advanced/ongoing HEP to facilitate ability to maintain/progress functional gains from skilled physical therapy services. Baseline:  Goal status: IN PROGRESS - 08/17/23 - Met for current HEP, but updated today  2.  Patient will be able to ambulate without gait deviations or staggering with or w/o LRAD on variable surfaces with good safety to access community.  Baseline: Inconsistent foot clearance leading to occasional stagger Goal status: MET - 08/24/23  3.  Patient will be able to step up/down curb safely with LRAD for safety with community ambulation.  Baseline:  Goal status: MET - 08/24/23   4.  Patient will demonstrate improved B LE strength to >/= 4+/5 for improved stability and ease of mobility . Baseline: Refer to above MMT table Goal status: IN PROGRESS - 08/17/23 - Met except B hip ABD and R hip extension 4/5  5.  Patient will improve Berg score by >/= 8 points or to >/= 52/56 to improve safety and stability with ADLs in standing and reduce risk for falls. (MCID= 8 points)  Baseline: 49/56 Goal status: MET - 08/24/23 - 56/56  6. Patient will improve FGA score to at least 22/30 to improve gait stability and reduce risk for falls. Baseline:  18/30 Goal status: MET & REVISED (See LTG 6a below) - 08/24/23 - 23/30  6a.  Patient will improve FGA score to at least 25/30 to improve gait stability and reduce risk for falls to low fall risk. Baseline: 18/30 on eval, 23/30 on 08/24/23  Goal status: REVISED   7.  Patient will report >/= 90% on ABC scale to demonstrate improved functional ability. Baseline: 1390 / 1600 = 86.9 % Goal status: MET - 08/24/23 - 1450 / 1600 = 90.6 %   PLAN:  PT FREQUENCY: 1x/week  PT DURATION: 4 weeks  PLANNED INTERVENTIONS: Therapeutic exercises, Therapeutic activity, Neuromuscular re-education, Balance training, Gait training, Patient/Family education, Self Care, Joint mobilization, Stair training, DME instructions, Cryotherapy, Moist heat, Taping, Manual therapy, and Re-evaluation  PLAN FOR NEXT SESSION: LE flexibility/stretching; core/lumbopelvic and LE strengthening (esp hip extension); balance training; dynamic gait training   Marry Guan, PT 09/07/2023, 12:40 PM

## 2023-09-14 ENCOUNTER — Encounter: Payer: Self-pay | Admitting: Physical Therapy

## 2023-09-14 ENCOUNTER — Ambulatory Visit: Payer: Medicare HMO | Admitting: Physical Therapy

## 2023-09-14 DIAGNOSIS — R2681 Unsteadiness on feet: Secondary | ICD-10-CM

## 2023-09-14 DIAGNOSIS — R2689 Other abnormalities of gait and mobility: Secondary | ICD-10-CM

## 2023-09-14 DIAGNOSIS — Z9181 History of falling: Secondary | ICD-10-CM

## 2023-09-14 DIAGNOSIS — M6281 Muscle weakness (generalized): Secondary | ICD-10-CM

## 2023-09-14 NOTE — Therapy (Signed)
OUTPATIENT PHYSICAL THERAPY TREATMENT       Patient Name: Timothy Conway MRN: [id number] DOB:1956-06-26, 67 y.o., male Today's Date: 09/14/2023   END OF SESSION:  PT End of Session - 09/14/23 1014     Visit Number 11    Date for PT Re-Evaluation 09/21/23    Authorization Type [employer]    Authorization Time Period 08/31/23 - 09/21/23    Authorization - Visit Number 3    Authorization - Number of Visits 8    Progress Note Due on Visit 18   Recert on visit #8 - 08/24/23   PT Start Time 1014    PT Stop Time 1102    PT Time Calculation (min) 48 min    Activity Tolerance Patient tolerated treatment well    Behavior During Therapy [facility] for tasks assessed/performed                    Past Medical History:  Diagnosis Date   Adenocarcinoma, lung, left (HCC) 07/05/2017   Age-related osteoporosis without current pathological fracture 12/12/2021   Anxiety    COPD (chronic obstructive pulmonary disease) (HCC)    Depression    Dry eye syndrome    Dysrhythmia    hx of PAC's   Effusion of elbow joint, right 06/29/2017   Gastric ulcer with perforation but without obstruction (HCC) 04/15/2016   GERD (gastroesophageal reflux disease)    Gout    Hyperlipidemia    Hypertension    Hypothyroidism    OSA (obstructive sleep apnea) 02/18/2023   Papillary thyroid carcinoma s/p total thyroidectomy & CLND 08/27/2017 08/24/2017   Postsurgical hypothyroidism 10/01/2017   Thyroid cancer (HCC)    2018   Past Surgical History:  Procedure Laterality Date   ABDOMINAL SURGERY     hemmoraging ulcer   BRONCHIAL BIOPSY  06/25/2020   Procedure: BRONCHIAL BIOPSIES;  Surgeon: [name], MD;  Location: [facility] ENDOSCOPY;  Service: Pulmonary;;   BRONCHIAL BRUSHINGS  06/25/2020   Procedure: BRONCHIAL BRUSHINGS;  Surgeon: [name], MD;  Location: [facility] ENDOSCOPY;  Service: Pulmonary;;   BRONCHIAL NEEDLE ASPIRATION BIOPSY  06/25/2020   Procedure: BRONCHIAL NEEDLE ASPIRATION BIOPSIES;  Surgeon:  [name], MD;  Location: [facility] ENDOSCOPY;  Service: Pulmonary;;   BRONCHIAL WASHINGS  06/25/2020   Procedure: BRONCHIAL WASHINGS;  Surgeon: [name], MD;  Location: [facility] ENDOSCOPY;  Service: Pulmonary;;   CATARACT EXTRACTION Right    COLONOSCOPY     EYE SURGERY Right    x 4  between ages 77-13   FIDUCIAL MARKER PLACEMENT  06/25/2020   Procedure: FIDUCIAL MARKER PLACEMENT;  Surgeon: [name], MD;  Location: [facility] ENDOSCOPY;  Service: Pulmonary;;   THYROIDECTOMY N/A 08/27/2017   Procedure: TOTAL THYROIDECTOMY WITH LIMITED LYMPH NODE DISSECTION;  Surgeon: [name], MD;  Location: [facility] ORS;  Service: General;  Laterality: N/A;   UPPER GI ENDOSCOPY     VIDEO BRONCHOSCOPY WITH ENDOBRONCHIAL NAVIGATION N/A 06/25/2020   Procedure: VIDEO BRONCHOSCOPY WITH ENDOBRONCHIAL NAVIGATION;  Surgeon: [name], MD;  Location: [facility] ENDOSCOPY;  Service: Pulmonary;  Laterality: N/A;   Patient Active Problem List   Diagnosis Date Noted   Sleep apnea-like behavior 04/08/2023   Excessive daytime sleepiness 02/18/2023   History of gastric ulcer 02/18/2023   HTN (hypertension), benign 02/18/2023   Hx of papillary thyroid carcinoma 02/18/2023   Hypercholesteremia 02/18/2023   Low back pain 02/18/2023   Major depression, single episode 02/18/2023   Morbid obesity (HCC) 02/18/2023  Nodule of left lung 02/18/2023   OSA (obstructive sleep apnea) 02/18/2023   Non-restorative sleep 02/18/2023   Penetrating ulcer of aorta (HCC) 02/18/2023   Protrusion of intervertebral disc of thoracic region 02/18/2023   Seborrheic dermatitis 02/18/2023   Vitamin D deficiency 02/18/2023   Late effect of cerebrovascular accident (CVA) 01/05/2023   Dyslipidemia 01/05/2023   History of smoking 01/05/2023   Age-related osteoporosis without current pathological fracture 12/12/2021   Postsurgical hypothyroidism 10/01/2017   Papillary thyroid carcinoma s/p total thyroidectomy & CLND 08/27/2017 08/24/2017    Adenocarcinoma, lung, left (HCC) 07/05/2017   COPD (chronic obstructive pulmonary disease) (HCC) 07/05/2017   Effusion of elbow joint, right 06/29/2017   Gastric ulcer with perforation but without obstruction (HCC) 04/15/2016    PCP: [name], MD   REFERRING PROVIDER: [name], MD   REFERRING DIAG: R53.81 (ICD-10-CM) - Declining functional status   THERAPY DIAG:  Unsteadiness on feet  Other abnormalities of gait and mobility  Muscle weakness (generalized)  History of falling  RATIONALE FOR EVALUATION AND TREATMENT: Rehabilitation  ONSET DATE: December 2023  NEXT MD VISIT: Unknown   SUBJECTIVE:                                                                                                                                                                                                         SUBJECTIVE STATEMENT: [place]ford denies pain today. He reports his leg feels more tired from his HEP exercises this week.  PAIN: Are you having pain? 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   PRECAUTIONS: Other: Implanted loop recorder & completely blind in his right eye    RED FLAGS: None  WEIGHT BEARING RESTRICTIONS: No  FALLS:  Has patient fallen in last 6 months? No and Yes. Number of falls at least 6 in April/May per documentation in recent PT episode, however patient denies any recent falls  LIVING ENVIRONMENT:  Lives with: lives alone Lives in: House/apartment Stairs: No Has following equipment at home: Single point cane, Environmental consultant - 2 wheeled, shower chair, and Grab bars  OCCUPATION: Retired  PLOF: Independent and Leisure: watching TV, mostly sedentary   PATIENT GOALS: "Strengthening my knees and improve my  balance."   OBJECTIVE: (objective measures completed at initial evaluation unless otherwise dated)  DIAGNOSTIC FINDINGS:  04/06/23 - CT angio head & neck: IMPRESSION: 1. No  large vessel occlusion. 2. Severe stenosis of the right vertebral artery origin. 3. Approximately 50% stenosis of the proximal left ICA in the neck. 4. Left upper lobe treatment change, incompletely imaged and not well assessed on this study. Please see prior CT chest. 5. Bulky bridging anterior osteophytes at multiple cervical levels.  04/09/23 - CT chest: IMPRESSION: 1. Unchanged post treatment/post radiation appearance of the anterior left upper lobe with irregular scarring and volume loss with biopsy marking clips and or fiducial markers. Most discrete residual nodule anteriorly measures 1.1 x 0.8 cm, unchanged. 2. No evidence of recurrent or metastatic disease in the chest. 3. Mildly coarse contour of the liver, suggestive of cirrhosis.  11/07/22 - MRI brain: IMPRESSION: 1. Positive for a small subacute lacunar infarct in the Right cerebellum. No associated hemorrhage or mass effect. And underlying chronic small vessel ischemic disease in the cerebral white matter and basal ganglia. 2. No metastatic disease identified. 3. Bulky degenerative anterior cervical spine spurring at C3-C4.  COGNITION: Overall cognitive status: Impaired - reports STM deficits   SENSATION: WFL  COORDINATION: Grossly WFL  MUSCLE LENGTH: Hamstrings: Mod tight B ITB: Mild/mod tight B Piriformis: Mod/severe tight B Hip flexors: Mod tight B Quads: Mod tight B Heelcord: NT  POSTURE:  rounded shoulders, forward head, and wide BOS  LOWER EXTREMITY ROM:    B hips limited due to muscle tightness with B knee extension limited by HS tightness and weakness Active  Right Eval Left Eval  Knee flexion 128 132  Knee extension -9 LAQ / 0 supine -28 LAQ / 0 supine    LOWER EXTREMITY MMT:    MMT Right eval Left eval R 08/17/23 L 08/17/23   Hip flexion 4 4 4+ 4+  Hip extension 3+ 4- 4 4+  Hip abduction 4- 4- 4 4  Hip adduction 4 4 4+ 4+  Hip internal rotation 4 4 5 5   Hip external rotation 4 4 4+ 4+  Knee flexion 4+ 5 5 5   Knee extension 4 4 5 5   Ankle dorsiflexion 4+ 5 5 5   Ankle plantarflexion      Ankle inversion      Ankle eversion      (Blank rows = not tested)  BED MOBILITY:  Grossly independent  TRANSFERS: Assistive device utilized: None  Sit to stand: Complete Independence Stand to sit: Complete Independence Chair to chair:  NT Floor:  NT  GAIT: Distance walked: Clinic distances Assistive device utilized: None Level of assistance: Complete Independence and SBA Gait pattern: step through pattern, decreased arm swing- Right, decreased arm swing- Left, shuffling, wide BOS, poor foot clearance- Right, and poor foot clearance- Left Comments: Occasional staggering due to decreased foot clearance  STAIRS:  Level of Assistance: SBA  Stair Negotiation Technique: Alternating Pattern  with Single Rail on Right  Number of Stairs: 14   Height of Stairs: 7"  Comments: Inconsistent foot clearance on step with heels dragging down edge of step on some steps on descent  FUNCTIONAL TESTS:  5 times sit to stand: 15.21 sec w/o UE assist Timed up and go (TUG): 10.47 sec w/o AD 10 meter walk test: 9.25 sec; Gait speed: 3.55 ft/sec  Berg Balance Scale: 49/56 Functional gait assessment: 18/30; < 19 = high risk fall   08/13/23 - 5xSTS = 14.37 sec w/o UE assist  08/24/23: 5xSTS = 12.69 sec w/o UE assist Berg = 56/56 FGA = 23/30; 19-24 = medium risk fall  = 8.31 sec Gait speed =  3.95 ft/sec - normal gait pattern  PATIENT SURVEYS:  ABC scale 1390 / 1600 = 86.9 % ; 08/24/23 - 1450 / 1600 = 90.6 %   TODAY'S TREATMENT:   09/14/23 THERAPEUTIC EXERCISE: to improve flexibility, strength and mobility.  Demonstration, verbal and tactile cues throughout for technique.  NuStep - L6 x 6 min (UE/LE) Squat touch to  chair w/o UE support x 1 - unable to keep going Squat touch to chair + Airex pad w/o UE support x 5 - increased genu valgum noted on R STS from chair + Airex pad with GTB hip ABD isometric w/o UE support x 10 - decreased genu valgum noted on R STS from standard height chair + GTB hip ABD isometric w/o UE support x 5 - decreased genu valgum noted on R B side-stepping with looped GTB at ankles 2 x 10 ft along wall Fwd & back monster walk with looped GTB at ankles 2 x 10 ft along wall  NEUROMUSCULAR RE-EDUCATION: To improve posture, balance, proprioception, coordination, amplitude of movement, and reduce rigidity. Penguin lateral wt shift/rock with looped GTB at knees to point of fatigue (knees starting to bend) Penguin ant/post wt shift/rock with looped GTB at knees to point of fatigue - 1 set with each foot fwd  THERAPEUTIC ACTIVITIES: Floor to stand transfer through quadruped - independent FGA = 25/30   09/07/23 THERAPEUTIC EXERCISE: to improve flexibility, strength and mobility.  Demonstration, verbal and tactile cues throughout for technique.  NuStep - L6 x 6 min (UE/LE) Standing Fitter (1 black/1 blue) R/L hip abduction & extension x 10 each   NEUROMUSCULAR RE-EDUCATION: To improve posture, balance, proprioception, coordination, reduce fall risk, and amplitude of movement. CGA/SBA of PT via gait belt during all activities. Staggered stance with fwd foot on 2" book facing counter: Static stance x 15 sec bil Vertical head nods (eyes open & closed) x 10 each Standing on Airex pad facing counter: Overhead B UE reaches x 10 Overhead arc with B UE x 10 Lunge position with fwd reach/rock x 10 with each leg fwd - more difficult with R LE fwd Penguin lateral wt shift/rock with looped GTB at knees to point of fatigue (knees starting to bend) Penguin ant/post wt shift/rock with looped GTB at knees to point of fatigue - 1 set with each foot fwd High knee march with single UE support on counter x  10 Toe walking along counter 4 x 10 ft Fwd step + GTB B UE fwd press x 10 (band wrapped behind torso) x 10 alt feet Quick mini-hop with UE support on counter - unable to clear feet (mostly bouncing on heels)  THERAPEUTIC ACTIVITIES: 5xSTS = 13.10 sec   08/31/23 THERAPEUTIC EXERCISE: to improve flexibility, strength and mobility.  Demonstration, verbal and tactile cues throughout for technique.  NuStep - L6 x 6 min (UE/LE) Seated on dyna-disk: --I'm a winner with blue weight ball x 10 feet together  --Side to side with blue weight ball x 10 feet together Rows GTB x 10 feet together Pallof press GTB (doubled) x 10 each direction  Stepping strategy with bil arm raise x 10 Toe taps hitting colored dots fwd and lateral both ways --Progressed to calling out specific colors with specific LE to incorporate cognitive aspect Progressed toe taps hitting colored cones for increased hip ROM fwd and lateral both ways --Progressed to calling out specific colors with specific LE to incorporate cognitive aspect   PATIENT EDUCATION:  Education details: progress with PT and  HEP progression - progressed resistance and modified squat touches and sit to stand to include GTB hip ABD isometric   Person educated: Patient Education method: Explanation, Demonstration, and Verbal cues Education comprehension: verbalized understanding, returned demonstration, and verbal cues required  HOME EXERCISE PROGRAM: Access Code: [email or web address] URL: [email or web address] Date: 08/20/2023 Prepared by: Verta Ellen  Exercises - Squat with Chair Touch  - 1 x daily - 7 x weekly - 2 sets - 10 reps - Heel Toe Raises with Counter Support  - 1 x daily - 7 x weekly - 2 sets - 10 reps - Tandem Stance in Corner  - 1 x daily - 7 x weekly - 3 sets - 10 reps - Corner Balance   - 1 x daily - 7 x weekly - 3 sets - 10 reps - Supine Lower Trunk Rotation  - 2 x daily - 7 x weekly - 3 sets - 10 reps - Supine Quadriceps  Stretch with Strap on Table  - 2 x daily - 7 x weekly - 3 sets - 3 reps - 30 sec hold - Supine Single Knee to Chest Stretch  - 2 x daily - 7 x weekly - 3 sets - Supine Piriformis Stretch with Foot on Ground  - 2 x daily - 7 x weekly - 3 sets - 30 sec hold - Supine Figure 4 Piriformis Stretch  - 2 x daily - 7 x weekly - 3 sets - 30 sec hold - Supine Hip Adduction Isometric with Ball  - 1 x daily - 7 x weekly - 3 sets - 10 reps - 3 sec hold - Seated Hamstring Stretch  - 2 x daily - 7 x weekly - 3 sets - 30 sec hold - Standing Hip Flexion with Anchored Resistance and Chair Support  - 1 x daily - 3-4 x weekly - 2 sets - 10 reps - 3 sec hold - Standing Hip Adduction with Anchored Resistance  - 1 x daily - 3-4 x weekly - 2 sets - 10 reps - 3 sec hold - Standing Hip Extension with Anchored Resistance  - 1 x daily - 3-4 x weekly - 2 sets - 10 reps - 3 sec hold - Standing Hip Abduction with Anchored Resistance  - 1 x daily - 3-4 x weekly - 2 sets - 10 reps - 3 sec hold - Side Stepping with Resistance at Ankles and Counter Support  - 1 x daily - 3-4 x weekly - 2 sets - 10 reps - 3 sec hold - Forward and Backward Monster Walk with Resistance at Ankles and Counter Support  - 1 x daily - 3-4 x weekly - 2 sets - 10 reps - 3 sec hold   ASSESSMENT:  CLINICAL IMPRESSION: Chaka reports increased fatigue from working on unsupported sit to/from stand.  Review of technique revealing R genu valgum resulting in R lateral shift at hips.  Added GTB hip abduction isometric to squat touches and sit to stands resulting in better alignment and pt reporting increased ease of transition.  Also able to progress resisted side-stepping and monster walks to looped GTB at ankles with good tolerance.  Reviewed floor transfers with pt able to complete transition w/o knee for external support or assistance.  Ethin denies any issues with everyday activities, although still notes he is challenged by corner balance activities as part  of HEP.  Initiated final goal assessment as only 1 visit remaining in current POC as pt appears that he should be  ready to transition to his HEP following this last visit.  FGA revealing further improvement to 25/30 meeting revised goal.  Will plan to complete remaining goal assessment and address any further concerns with HEP next visit, with anticipated transition to HEP.  OBJECTIVE IMPAIRMENTS: Abnormal gait, decreased activity tolerance, decreased balance, decreased coordination, decreased endurance, decreased knowledge of condition, decreased knowledge of use of DME, decreased mobility, difficulty walking, decreased ROM, decreased strength, decreased safety awareness, impaired flexibility, improper body mechanics, postural dysfunction, and pain.   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   REHAB POTENTIAL: Good  CLINICAL DECISION MAKING: Stable/uncomplicated  EVALUATION COMPLEXITY: Low   GOALS: Goals reviewed with patient? Yes  SHORT TERM GOALS: Target date: 08/04/2023  Patient will be independent with initial HEP to improve outcomes and carryover.  Baseline: given 08/04/23 Goal status: MET - 08/10/23  2.  Patient will be educated on strategies to decrease risk of falls.  Baseline:  Goal status: MET - 08/06/23  3.  Patient will improve 5x STS time to </= 12 seconds for improved efficiency and safety with transfers. Baseline: 15.21 sec w/o UE assist; 08/24/23 -  12.69 sec Goal status: IN PROGRESS - 09/07/23 - 13.10 sec  LONG TERM GOALS: Target date: 08/25/2023, extended to 09/21/2023  Patient will be independent with advanced/ongoing HEP to facilitate ability to maintain/progress functional gains from skilled physical therapy services. Baseline:  Goal status: IN PROGRESS - 09/14/23 - Met for current HEP, but progressed resistance and modified squat touches and sit to stand to include GTB hip ABD isometric  2.  Patient will be able to ambulate without gait deviations or staggering with or w/o LRAD on variable surfaces with good safety to access community.  Baseline: Inconsistent foot clearance leading to occasional stagger Goal status: MET - 08/24/23  3.  Patient will be able to step up/down curb safely with LRAD for safety with community ambulation.  Baseline:  Goal status: MET - 08/24/23   4.  Patient will demonstrate improved B LE strength to >/= 4+/5 for improved stability and ease of mobility . Baseline: Refer to above MMT table Goal status: IN PROGRESS - 08/17/23 - Met except B hip ABD and R hip extension 4/5  5.  Patient will improve Berg score by >/= 8 points or to >/= 52/56 to improve safety and stability with ADLs in standing and reduce risk for falls. (MCID= 8 points)  Baseline: 49/56 Goal status: MET - 08/24/23 - 56/56  6. Patient will improve FGA score to at least 22/30 to improve gait stability and reduce risk for falls. Baseline: 18/30 Goal status: MET & REVISED (See LTG 6a below) - 08/24/23 - 23/30  6a.  Patient will improve FGA score to at least 25/30 to improve gait stability and reduce risk for falls to low fall risk. Baseline: 18/30 on eval, 23/30 on 08/24/23  Goal status: MET - 09/14/23 - 25/30   7.  Patient will report >/= 90% on ABC scale to demonstrate improved functional ability. Baseline: 1390 / 1600 = 86.9 % Goal status: MET - 08/24/23 - 1450 /  1600 = 90.6 %   PLAN:  PT FREQUENCY: 1x/week  PT DURATION: 4  weeks  PLANNED INTERVENTIONS: Therapeutic exercises, Therapeutic activity, Neuromuscular re-education, Balance training, Gait training, Patient/Family education, Self Care, Joint mobilization, Stair training, DME instructions, Cryotherapy, Moist heat, Taping, Manual therapy, and Re-evaluation  PLAN FOR NEXT SESSION: Final goal assessment; anticipate transition to HEP + D/C from PT   Marry Guan, PT 09/14/2023, 11:03 AM

## 2023-09-16 ENCOUNTER — Other Ambulatory Visit: Payer: Self-pay | Admitting: Internal Medicine

## 2023-09-16 DIAGNOSIS — C73 Malignant neoplasm of thyroid gland: Secondary | ICD-10-CM

## 2023-09-21 ENCOUNTER — Ambulatory Visit: Payer: Medicare HMO | Admitting: Physical Therapy

## 2023-09-21 ENCOUNTER — Encounter: Payer: Self-pay | Admitting: Physical Therapy

## 2023-09-21 DIAGNOSIS — Z9181 History of falling: Secondary | ICD-10-CM | POA: Diagnosis not present

## 2023-09-21 DIAGNOSIS — R2681 Unsteadiness on feet: Secondary | ICD-10-CM

## 2023-09-21 DIAGNOSIS — M6281 Muscle weakness (generalized): Secondary | ICD-10-CM | POA: Diagnosis not present

## 2023-09-21 DIAGNOSIS — R2689 Other abnormalities of gait and mobility: Secondary | ICD-10-CM | POA: Diagnosis not present

## 2023-09-21 NOTE — Therapy (Signed)
OUTPATIENT PHYSICAL THERAPY TREATMENT / DISCHARGE SUMMARY     Patient Name: Timothy Conway MRN: [id number] DOB:05-Feb-1956, 67 y.o., male Today's Date: 09/21/2023   END OF SESSION:  PT End of Session - 09/21/23 1100     Visit Number 12    Date for PT Re-Evaluation 09/21/23    Authorization Type [employer]    Authorization Time Period 08/31/23 - 09/21/23    Authorization - Visit Number 4    Authorization - Number of Visits 8    Progress Note Due on Visit 18   Recert on visit #8 - 08/24/23   PT Start Time 1100    PT Stop Time 1130    PT Time Calculation (min) 30 min    Activity Tolerance Patient tolerated treatment well    Behavior During Therapy [facility] for tasks assessed/performed                    Past Medical History:  Diagnosis Date   Adenocarcinoma, lung, left (HCC) 07/05/2017   Age-related osteoporosis without current pathological fracture 12/12/2021   Anxiety    COPD (chronic obstructive pulmonary disease) (HCC)    Depression    Dry eye syndrome    Dysrhythmia    hx of PAC's   Effusion of elbow joint, right 06/29/2017   Gastric ulcer with perforation but without obstruction (HCC) 04/15/2016   GERD (gastroesophageal reflux disease)    Gout    Hyperlipidemia    Hypertension    Hypothyroidism    OSA (obstructive sleep apnea) 02/18/2023   Papillary thyroid carcinoma s/p total thyroidectomy & CLND 08/27/2017 08/24/2017   Postsurgical hypothyroidism 10/01/2017   Thyroid cancer (HCC)    2018   Past Surgical History:  Procedure Laterality Date   ABDOMINAL SURGERY     hemmoraging ulcer   BRONCHIAL BIOPSY  06/25/2020   Procedure: BRONCHIAL BIOPSIES;  Surgeon: [name], MD;  Location: [facility] ENDOSCOPY;  Service: Pulmonary;;   BRONCHIAL BRUSHINGS  06/25/2020   Procedure: BRONCHIAL BRUSHINGS;  Surgeon: [name], MD;  Location: [facility] ENDOSCOPY;  Service: Pulmonary;;   BRONCHIAL NEEDLE ASPIRATION BIOPSY  06/25/2020   Procedure: BRONCHIAL NEEDLE ASPIRATION  BIOPSIES;  Surgeon: [name], MD;  Location: [facility] ENDOSCOPY;  Service: Pulmonary;;   BRONCHIAL WASHINGS  06/25/2020   Procedure: BRONCHIAL WASHINGS;  Surgeon: [name], MD;  Location: [facility] ENDOSCOPY;  Service: Pulmonary;;   CATARACT EXTRACTION Right    COLONOSCOPY     EYE SURGERY Right    x 4  between ages 30-13   FIDUCIAL MARKER PLACEMENT  06/25/2020   Procedure: FIDUCIAL MARKER PLACEMENT;  Surgeon: [name], MD;  Location: [facility] ENDOSCOPY;  Service: Pulmonary;;   THYROIDECTOMY N/A 08/27/2017   Procedure: TOTAL THYROIDECTOMY WITH LIMITED LYMPH NODE DISSECTION;  Surgeon: [name], MD;  Location: [facility] ORS;  Service: General;  Laterality: N/A;   UPPER GI ENDOSCOPY     VIDEO BRONCHOSCOPY WITH ENDOBRONCHIAL NAVIGATION N/A 06/25/2020   Procedure: VIDEO BRONCHOSCOPY WITH ENDOBRONCHIAL NAVIGATION;  Surgeon: [name], MD;  Location: [facility] ENDOSCOPY;  Service: Pulmonary;  Laterality: N/A;   Patient Active Problem List   Diagnosis Date Noted   Sleep apnea-like behavior 04/08/2023   Excessive daytime sleepiness 02/18/2023   History of gastric ulcer 02/18/2023   HTN (hypertension), benign 02/18/2023   Hx of papillary thyroid carcinoma 02/18/2023   Hypercholesteremia 02/18/2023   Low back pain 02/18/2023   Major depression, single episode 02/18/2023   Morbid obesity (HCC) 02/18/2023  Nodule of left lung 02/18/2023   OSA (obstructive sleep apnea) 02/18/2023   Non-restorative sleep 02/18/2023   Penetrating ulcer of aorta (HCC) 02/18/2023   Protrusion of intervertebral disc of thoracic region 02/18/2023   Seborrheic dermatitis 02/18/2023   Vitamin D deficiency 02/18/2023   Late effect of cerebrovascular accident (CVA) 01/05/2023   Dyslipidemia 01/05/2023   History of smoking 01/05/2023   Age-related osteoporosis without current pathological fracture 12/12/2021   Postsurgical hypothyroidism 10/01/2017   Papillary thyroid carcinoma s/p total thyroidectomy & CLND 08/27/2017  08/24/2017   Adenocarcinoma, lung, left (HCC) 07/05/2017   COPD (chronic obstructive pulmonary disease) (HCC) 07/05/2017   Effusion of elbow joint, right 06/29/2017   Gastric ulcer with perforation but without obstruction (HCC) 04/15/2016    PCP: [name], MD   REFERRING PROVIDER: [name], MD   REFERRING DIAG: R53.81 (ICD-10-CM) - Declining functional status   THERAPY DIAG:  Unsteadiness on feet  Other abnormalities of gait and mobility  Muscle weakness (generalized)  History of falling  RATIONALE FOR EVALUATION AND TREATMENT: Rehabilitation  ONSET DATE: December 2023  NEXT MD VISIT: Unknown   SUBJECTIVE:                                                                                                                                                                                                         SUBJECTIVE STATEMENT: Timothy Conway reports mild low back pain today. He denies any concerns with the HEP following recent review.  PAIN: Are you having pain? 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   PRECAUTIONS: Other: Implanted loop recorder & completely blind in his right eye    RED FLAGS: None  WEIGHT BEARING RESTRICTIONS: No  FALLS:  Has patient fallen in last 6 months? No and Yes. Number of falls at least 6 in April/May per documentation in recent PT episode, however patient denies any recent falls  LIVING ENVIRONMENT:  Lives with: lives alone Lives in: House/apartment Stairs: No Has following equipment at home: Single point cane, Environmental consultant - 2 wheeled, shower chair, and Grab bars  OCCUPATION: Retired  PLOF: Independent and Leisure: watching  TV, mostly sedentary   PATIENT GOALS: "Strengthening my knees and improve my balance."   OBJECTIVE: (objective measures completed at initial evaluation unless otherwise dated)  DIAGNOSTIC FINDINGS:  04/06/23 - CT angio head & neck: IMPRESSION: 1. No large vessel occlusion. 2. Severe stenosis of the right vertebral artery origin. 3. Approximately 50% stenosis of the proximal left ICA in the neck. 4. Left upper lobe treatment change, incompletely imaged and not well assessed on this study. Please see prior CT chest. 5. Bulky bridging anterior osteophytes at multiple cervical levels.  04/09/23 - CT chest: IMPRESSION: 1. Unchanged post treatment/post radiation appearance of the anterior left upper lobe with irregular scarring and volume loss with biopsy marking clips and or fiducial markers. Most discrete residual nodule anteriorly measures 1.1 x 0.8 cm, unchanged. 2. No evidence of recurrent or metastatic disease in the chest. 3. Mildly coarse contour of the liver, suggestive of cirrhosis.  11/07/22 - MRI brain: IMPRESSION: 1. Positive for a small subacute lacunar infarct in the Right cerebellum. No associated hemorrhage or mass effect. And underlying chronic small vessel ischemic disease in the cerebral white matter and basal ganglia. 2. No metastatic disease identified. 3. Bulky degenerative anterior cervical spine spurring at C3-C4.  COGNITION: Overall cognitive status: Impaired - reports STM deficits   SENSATION: WFL  COORDINATION: Grossly WFL  MUSCLE LENGTH: Hamstrings: Mod tight B ITB: Mild/mod tight B Piriformis: Mod/severe tight B Hip flexors: Mod tight B Quads: Mod tight B Heelcord: NT  POSTURE:  rounded shoulders, forward head, and wide BOS  LOWER EXTREMITY ROM:    B hips limited due to muscle tightness with B knee extension limited by HS tightness and weakness Active  Right Eval Left Eval  Knee flexion 128 132  Knee extension -9 LAQ / 0 supine -28 LAQ / 0  supine    LOWER EXTREMITY MMT:    MMT Right eval Left eval R 08/17/23 L 08/17/23 R 09/21/23 L 09/21/23  Hip flexion 4 4 4+ 4+ 5 5  Hip extension 3+ 4- 4 4+ 5 5  Hip abduction 4- 4- 4 4 4+ 5  Hip adduction 4 4 4+ 4+ 5 5  Hip internal rotation 4 4 5 5 5 5   Hip external rotation 4 4 4+ 4+ 5 5  Knee flexion 4+ 5 5 5 5 5   Knee extension 4 4 5 5 5 5   Ankle dorsiflexion 4+ 5 5 5 5 5   Ankle plantarflexion     5 5  Ankle inversion        Ankle eversion        (Blank rows = not tested)  BED MOBILITY:  Grossly independent  TRANSFERS: Assistive device utilized: None  Sit to stand: Complete Independence Stand to sit: Complete Independence Chair to chair:  NT Floor:  NT  GAIT: Distance walked: Clinic distances Assistive device utilized: None Level of assistance: Complete Independence and SBA Gait pattern: step through pattern, decreased arm swing- Right, decreased arm swing- Left, shuffling, wide BOS, poor foot clearance- Right, and poor foot clearance- Left Comments: Occasional staggering due to decreased foot clearance  STAIRS:  Level of Assistance: SBA  Stair Negotiation Technique: Alternating Pattern  with Single Rail on Right  Number of Stairs: 14   Height of Stairs: 7"  Comments: Inconsistent foot clearance on step with heels dragging down edge of step on some steps on descent  FUNCTIONAL TESTS:  5 times sit to stand: 15.21 sec w/o UE assist Timed up and go (TUG): 10.47 sec w/o AD 10 meter walk test: 9.25 sec; Gait speed: 3.55 ft/sec  Berg Balance Scale: 49/56 Functional gait assessment: 18/30; < 19 = high risk fall  08/13/23 - 5xSTS = 14.37 sec w/o UE assist  08/24/23: 5xSTS = 12.69 sec w/o UE assist Berg = 56/56 FGA = 23/30; 19-24 = medium risk fall  = 8.31 sec Gait speed = 3.95 ft/sec - normal gait pattern  09/14/23: FGA = 25/30; low fall risk  09/21/23: 5xSTS = 11.22 sec w/o UE assist = 8.44 sec Gait speed = 3.89 ft/sec - normal gait  pattern  PATIENT SURVEYS:  ABC scale 1390 / 1600 = 86.9 % ; 08/24/23 - 1450 / 1600 = 90.6 %   TODAY'S TREATMENT:   09/21/23 THERAPEUTIC EXERCISE: to improve flexibility, strength and mobility.  Demonstration, verbal and tactile cues throughout for technique.  NuStep - L6 x 6 min (UE/LE) Verbal HEP review and recommendations for maintenance level performance  THERAPEUTIC ACTIVITIES: 5xSTS = 11.22 sec w/o UE assist = 8.44 sec Gait speed = 3.89 ft/sec - normal gait pattern LE MMT   09/14/23 THERAPEUTIC EXERCISE: to improve flexibility, strength and mobility.  Demonstration, verbal and tactile cues throughout for technique.  NuStep - L6 x 6 min (UE/LE) Squat touch to chair w/o UE support x 1 - unable to keep going Squat touch to chair + Airex pad w/o UE support x 5 - increased genu valgum noted on R STS from chair + Airex pad with GTB hip ABD isometric w/o UE support x 10 - decreased genu valgum noted on R STS from standard height chair + GTB hip ABD isometric w/o UE support x 5 - decreased genu valgum noted on R B side-stepping with looped GTB at ankles 2 x 10 ft along wall Fwd & back monster walk with looped GTB at ankles 2 x 10 ft along wall  NEUROMUSCULAR RE-EDUCATION: To improve posture, balance, proprioception, coordination, amplitude of movement, and reduce rigidity. Penguin lateral wt shift/rock with looped GTB at knees to point of fatigue (knees starting to bend) Penguin ant/post wt shift/rock with looped GTB at knees to point of fatigue - 1 set with each foot fwd  THERAPEUTIC ACTIVITIES: Floor to stand transfer through quadruped - independent FGA = 25/30   09/07/23 THERAPEUTIC EXERCISE: to improve flexibility, strength and mobility.  Demonstration, verbal and tactile cues throughout for technique.  NuStep - L6 x 6 min (UE/LE) Standing Fitter (1 black/1 blue) R/L hip abduction & extension x 10 each   NEUROMUSCULAR RE-EDUCATION: To improve posture, balance,  proprioception, coordination, reduce fall risk, and amplitude of movement. CGA/SBA of PT via gait belt during all activities. Staggered stance with fwd foot on 2" book facing counter: Static stance x 15 sec bil Vertical head nods (eyes open & closed) x 10 each Standing on Airex pad facing counter: Overhead B UE reaches x 10 Overhead arc with B UE x 10 Lunge position with fwd reach/rock x 10 with each leg fwd - more difficult with R LE fwd Penguin lateral wt shift/rock with looped GTB at knees to point of fatigue (knees starting to bend) Penguin ant/post wt shift/rock with looped GTB at knees to point of fatigue - 1 set with each foot fwd High knee march with single UE support on counter x 10 Toe walking along counter 4 x 10 ft Fwd step + GTB B UE fwd press x 10 (band wrapped behind torso) x 10 alt feet Quick mini-hop with UE support on counter - unable to clear feet (mostly bouncing on heels)  THERAPEUTIC ACTIVITIES: 5xSTS = 13.10 sec   PATIENT EDUCATION:  Education details: HEP review, continue  with current HEP, and recommended frequency for ongoing HEP at discharge to prevent loss of gains achieved with PT  Person educated: Patient Education method: Explanation Education comprehension: verbalized understanding  HOME EXERCISE PROGRAM: Access Code: [email or web address] URL: [email or web address] Date: 08/20/2023 Prepared by: Verta Ellen  Exercises - Squat with Chair Touch  - 1 x daily - 7 x weekly - 2 sets - 10 reps - Heel Toe Raises with Counter Support  - 1 x daily - 7 x weekly - 2 sets - 10 reps - Tandem Stance in Corner  - 1 x daily - 7 x weekly - 3 sets - 10 reps - Corner Balance   - 1 x daily - 7 x weekly - 3 sets - 10 reps - Supine Lower Trunk Rotation  - 2 x daily - 7 x weekly - 3 sets - 10 reps - Supine Quadriceps Stretch with Strap on Table  - 2 x daily - 7 x weekly - 3 sets - 3 reps - 30 sec hold - Supine Single Knee to Chest Stretch  - 2 x daily - 7 x weekly - 3  sets - Supine Piriformis Stretch with Foot on Ground  - 2 x daily - 7 x weekly - 3 sets - 30 sec hold - Supine Figure 4 Piriformis Stretch  - 2 x daily - 7 x weekly - 3 sets - 30 sec hold - Supine Hip Adduction Isometric with Ball  - 1 x daily - 7 x weekly - 3 sets - 10 reps - 3 sec hold - Seated Hamstring Stretch  - 2 x daily - 7 x weekly - 3 sets - 30 sec hold - Standing Hip Flexion with Anchored Resistance and Chair Support  - 1 x daily - 3-4 x weekly - 2 sets - 10 reps - 3 sec hold - Standing Hip Adduction with Anchored Resistance  - 1 x daily - 3-4 x weekly - 2 sets - 10 reps - 3 sec hold - Standing Hip Extension with Anchored Resistance  - 1 x daily - 3-4 x weekly - 2 sets - 10 reps - 3 sec hold - Standing Hip Abduction with Anchored Resistance  - 1 x daily - 3-4 x weekly - 2 sets - 10 reps - 3 sec hold - Side Stepping with Resistance at Ankles and Counter Support  - 1 x daily - 3-4 x weekly - 2 sets - 10 reps - 3 sec hold - Forward and Backward Monster Walk with Resistance at Ankles and Counter Support  - 1 x daily - 3-4 x weekly - 2 sets - 10 reps - 3 sec hold   ASSESSMENT:  CLINICAL IMPRESSION: Marie admits he doesn't recognize his progress as well as the testing shows but is pleased with his progress when we reviewed his current status as compared to his baseline at the start of our PT episode. His B LE strength is now grossly 5/5. He has demonstrated good improvement on all standardized balance tests with current scores indicating only a low to no fall risk. We have discussed recommendations for continued long term performance of his HEP to prevent loss of his gains demonstrated with Pt and he verbalizes understanding.  All PT goals now met and Matej feels ready to transition to his HEP, therefore will proceed with discharge from physical therapy for this episode.  OBJECTIVE IMPAIRMENTS: Abnormal gait, decreased activity tolerance, decreased balance, decreased coordination,  decreased endurance, decreased knowledge of condition, decreased knowledge of  use of DME, decreased mobility, difficulty walking, decreased ROM, decreased strength, decreased safety awareness, impaired flexibility, improper body mechanics, postural dysfunction, and pain.   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   REHAB POTENTIAL: Good  CLINICAL DECISION MAKING: Stable/uncomplicated  EVALUATION COMPLEXITY: Low   GOALS: Goals reviewed with patient? Yes  SHORT TERM GOALS: Target date: 08/04/2023  Patient will be independent with initial HEP to improve outcomes and carryover.  Baseline: given 08/04/23 Goal status: MET - 08/10/23  2.  Patient will be educated on strategies to decrease risk of falls.  Baseline:  Goal status: MET - 08/06/23  3.  Patient will improve 5x STS time to </= 12 seconds for improved efficiency and safety with transfers. Baseline: 15.21 sec w/o UE assist; 08/24/23 - 12.69 sec; 09/07/23 - 13.10 sec Goal status: MET - 09/21/23 - 11.22 sec  LONG TERM GOALS: Target date: 08/25/2023, extended to 09/21/2023  Patient will be independent with advanced/ongoing HEP to facilitate ability to maintain/progress functional gains from skilled physical therapy services. Baseline:  Goal status: MET - 09/21/23   2.  Patient will be able to ambulate without gait deviations or staggering with or w/o LRAD on variable surfaces with good safety to access community.   Baseline: Inconsistent foot clearance leading to occasional stagger Goal status: MET - 08/24/23  3.  Patient will be able to step up/down curb safely with LRAD for safety with community ambulation.  Baseline:  Goal status: MET - 08/24/23   4.  Patient will demonstrate improved B LE strength to >/= 4+/5 for improved stability and ease of mobility . Baseline: Refer to above MMT table Goal status: MET - 09/21/23  5.  Patient will improve Berg score by >/= 8 points or to >/= 52/56 to improve safety and stability with ADLs in standing and reduce risk for falls. (MCID= 8 points)  Baseline: 49/56 Goal status: MET - 08/24/23 - 56/56  6. Patient will improve FGA score to at least 22/30 to improve gait stability and reduce risk for falls. Baseline: 18/30 Goal status: MET & REVISED (See LTG 6a below) - 08/24/23 - 23/30  6a.  Patient will improve FGA score to at least 25/30 to improve gait stability and reduce risk for falls to low fall risk. Baseline: 18/30 on eval, 23/30 on 08/24/23  Goal status: MET - 09/14/23 - 25/30   7.  Patient will report >/= 90% on ABC scale to demonstrate improved functional ability. Baseline: 1390 / 1600 = 86.9 % Goal status: MET - 08/24/23 - 1450 / 1600 = 90.6 %   PLAN:  PT FREQUENCY: 1x/week  PT DURATION: 4 weeks  PLANNED INTERVENTIONS: Therapeutic exercises, Therapeutic activity, Neuromuscular re-education, Balance training, Gait training, Patient/Family education, Self Care, Joint mobilization, Stair training, DME instructions, Cryotherapy, Moist heat, Taping, Manual therapy, and Re-evaluation  PLAN FOR NEXT SESSION: Transition to HEP + D/C from PT   PHYSICAL THERAPY DISCHARGE SUMMARY  Visits from Start of Care: 12  Current functional level related to goals / functional outcomes: Refer to above clinical impression and goal assessment.   Remaining deficits:  None.   Education / Equipment: HEP   Patient agrees to discharge. Patient goals were met.  Patient is being discharged due to meeting the stated rehab goals.  Marry Guan, PT 09/21/2023, 11:37 AM

## 2023-09-21 NOTE — Progress Notes (Signed)
[facility] Summary Report / Loop Recorder 

## 2023-09-27 ENCOUNTER — Ambulatory Visit (HOSPITAL_BASED_OUTPATIENT_CLINIC_OR_DEPARTMENT_OTHER)
Admission: RE | Admit: 2023-09-27 | Discharge: 2023-09-27 | Disposition: A | Discharge status: Home / Self Care | Payer: Medicare HMO | Source: Ambulatory Visit | Attending: Hematology & Oncology | Admitting: Hematology & Oncology

## 2023-09-27 DIAGNOSIS — C3492 Malignant neoplasm of unspecified part of left bronchus or lung: Secondary | ICD-10-CM | POA: Insufficient documentation

## 2023-09-27 DIAGNOSIS — C3490 Malignant neoplasm of unspecified part of unspecified bronchus or lung: Secondary | ICD-10-CM | POA: Diagnosis not present

## 2023-09-27 DIAGNOSIS — I700 Atherosclerosis of aorta: Secondary | ICD-10-CM | POA: Diagnosis not present

## 2023-09-28 ENCOUNTER — Encounter: Payer: Self-pay | Admitting: *Deleted

## 2023-09-30 DIAGNOSIS — K219 Gastro-esophageal reflux disease without esophagitis: Secondary | ICD-10-CM | POA: Diagnosis not present

## 2023-09-30 DIAGNOSIS — U071 COVID-19: Secondary | ICD-10-CM | POA: Diagnosis not present

## 2023-09-30 DIAGNOSIS — I10 Essential (primary) hypertension: Secondary | ICD-10-CM | POA: Diagnosis not present

## 2023-09-30 DIAGNOSIS — F419 Anxiety disorder, unspecified: Secondary | ICD-10-CM | POA: Diagnosis not present

## 2023-10-06 ENCOUNTER — Telehealth: Payer: Self-pay | Admitting: Cardiology

## 2023-10-06 LAB — CUP PACEART REMOTE DEVICE CHECK
Date Time Interrogation Session: 20241102230938
Implantable Pulse Generator Implant Date: 20240514

## 2023-10-06 NOTE — Telephone Encounter (Signed)
Pt states that he was supposed to have a brain scan and has not heard anything. Please advise.

## 2023-10-07 NOTE — Telephone Encounter (Signed)
Contacted pt, LVM rq call back.   Per last visit 8/6, pt had CTA head/neck 03/2023.  Plan on repeat in 1 year for surveillance monitoring. This is the only scan we have discussed and completed. Those results were discussed with pt.

## 2023-10-11 ENCOUNTER — Inpatient Hospital Stay: Payer: Medicare HMO

## 2023-10-11 ENCOUNTER — Ambulatory Visit (INDEPENDENT_AMBULATORY_CARE_PROVIDER_SITE_OTHER): Payer: Medicare HMO

## 2023-10-11 ENCOUNTER — Inpatient Hospital Stay: Payer: Medicare HMO | Admitting: Hematology & Oncology

## 2023-10-11 DIAGNOSIS — I639 Cerebral infarction, unspecified: Secondary | ICD-10-CM

## 2023-10-11 NOTE — Telephone Encounter (Signed)
Pt returned call and message was relayed.

## 2023-10-14 ENCOUNTER — Ambulatory Visit (HOSPITAL_BASED_OUTPATIENT_CLINIC_OR_DEPARTMENT_OTHER)
Admission: RE | Admit: 2023-10-14 | Discharge: 2023-10-14 | Disposition: A | Discharge status: Home / Self Care | Payer: Medicare HMO | Source: Ambulatory Visit | Attending: Family Medicine | Admitting: Family Medicine

## 2023-10-14 ENCOUNTER — Ambulatory Visit (HOSPITAL_BASED_OUTPATIENT_CLINIC_OR_DEPARTMENT_OTHER)
Admission: RE | Admit: 2023-10-14 | Discharge: 2023-10-14 | Disposition: A | Discharge status: Home / Self Care | Payer: Medicare HMO | Source: Ambulatory Visit | Attending: Internal Medicine | Admitting: Internal Medicine

## 2023-10-14 DIAGNOSIS — M85851 Other specified disorders of bone density and structure, right thigh: Secondary | ICD-10-CM | POA: Diagnosis not present

## 2023-10-14 DIAGNOSIS — K7460 Unspecified cirrhosis of liver: Secondary | ICD-10-CM | POA: Diagnosis not present

## 2023-10-14 DIAGNOSIS — M810 Age-related osteoporosis without current pathological fracture: Secondary | ICD-10-CM | POA: Diagnosis not present

## 2023-10-14 DIAGNOSIS — K8020 Calculus of gallbladder without cholecystitis without obstruction: Secondary | ICD-10-CM | POA: Diagnosis not present

## 2023-10-15 ENCOUNTER — Encounter: Payer: Self-pay | Admitting: Hematology & Oncology

## 2023-10-15 ENCOUNTER — Inpatient Hospital Stay: Payer: Medicare HMO | Attending: Hematology & Oncology

## 2023-10-15 ENCOUNTER — Inpatient Hospital Stay (HOSPITAL_BASED_OUTPATIENT_CLINIC_OR_DEPARTMENT_OTHER): Payer: Medicare HMO | Admitting: Hematology & Oncology

## 2023-10-15 VITALS — BP 119/70 | HR 55 | Temp 98.3°F | Resp 24 | Wt 249.1 lb

## 2023-10-15 DIAGNOSIS — K760 Fatty (change of) liver, not elsewhere classified: Secondary | ICD-10-CM | POA: Insufficient documentation

## 2023-10-15 DIAGNOSIS — C3412 Malignant neoplasm of upper lobe, left bronchus or lung: Secondary | ICD-10-CM | POA: Insufficient documentation

## 2023-10-15 DIAGNOSIS — C3492 Malignant neoplasm of unspecified part of left bronchus or lung: Secondary | ICD-10-CM

## 2023-10-15 LAB — CMP (CANCER CENTER ONLY)
ALT: 10 U/L (ref 0–44)
AST: 15 U/L (ref 15–41)
Albumin: 4.6 g/dL (ref 3.5–5.0)
Alkaline Phosphatase: 25 U/L — ABNORMAL LOW (ref 38–126)
Anion gap: 7 (ref 5–15)
BUN: 19 mg/dL (ref 8–23)
CO2: 31 mmol/L (ref 22–32)
Calcium: 8.8 mg/dL — ABNORMAL LOW (ref 8.9–10.3)
Chloride: 105 mmol/L (ref 98–111)
Creatinine: 1.14 mg/dL (ref 0.61–1.24)
GFR, Estimated: >60 mL/min (ref >60)
Glucose, Bld: 106 mg/dL — ABNORMAL HIGH (ref 70–99)
Potassium: 4.8 mmol/L (ref 3.5–5.1)
Sodium: 143 mmol/L (ref 135–145)
Total Bilirubin: 0.4 mg/dL (ref <1.2)
Total Protein: 7 g/dL (ref 6.5–8.1)

## 2023-10-15 LAB — CBC WITH DIFFERENTIAL (CANCER CENTER ONLY)
Abs Immature Granulocytes: 0.02 10*3/uL (ref 0.00–0.07)
Basophils Absolute: 0.1 10*3/uL (ref 0.0–0.1)
Basophils Relative: 1 %
Eosinophils Absolute: 0.2 10*3/uL (ref 0.0–0.5)
Eosinophils Relative: 3 %
HCT: 45.7 % (ref 39.0–52.0)
Hemoglobin: 14.8 g/dL (ref 13.0–17.0)
Immature Granulocytes: 0 %
Lymphocytes Relative: 34 %
Lymphs Abs: 2.3 10*3/uL (ref 0.7–4.0)
MCH: 30.8 pg (ref 26.0–34.0)
MCHC: 32.4 g/dL (ref 30.0–36.0)
MCV: 95.2 fL (ref 80.0–100.0)
Monocytes Absolute: 0.5 10*3/uL (ref 0.1–1.0)
Monocytes Relative: 8 %
Neutro Abs: 3.7 10*3/uL (ref 1.7–7.7)
Neutrophils Relative %: 54 %
Platelet Count: 235 10*3/uL (ref 150–400)
RBC: 4.8 MIL/uL (ref 4.22–5.81)
RDW: 12.6 % (ref 11.5–15.5)
WBC Count: 6.8 10*3/uL (ref 4.0–10.5)
nRBC: 0 % (ref 0.0–0.2)

## 2023-10-15 NOTE — Progress Notes (Signed)
[name]  Timothy Conway [id number] 07-Apr-1956 67 y.o. 10/15/2023   Principle Diagnosis:  Stage Ia (T1N0M0) adenocarcinoma of the left upper lung  Current Therapy:   Status post radiosurgery-5400 rad that was completed in 08/20/2020     Interim History:  Mr. Haenel is back for follow-up.  We saw him back in May.  Since then, has been doing okay.  He has lost some weight.  He stopped drinking beer.  He is happy about this.  He did have a ultrasound of his liver.  This shows some hepatic steatosis.  We did do a CT scan of his chest.  Thankfully, this did not show any thing that looked suspicious for growth.  Where he had the radiosurgery that lesion was stable.  And a couple smaller lesions are also stable.  He has had no cough.  There is no shortness of breath.  He has had no change in bowel or bladder habits.  He has had no leg swelling.  He has had no bleeding.  Overall, I would say his performance status is probably ECOG 1.      Medications:  Current Outpatient Medications:    alendronate (FOSAMAX) 70 MG tablet, TAKE 1 TABLET EVERY 7 DAYS WITH A FULL GLASS OF WATER ON AN EMPTY STOMACH. (Patient taking differently: Take 70 mg by mouth once a week.), Disp: 12 tablet, Rfl: 3   aspirin EC 81 MG tablet, Take 1 tablet (81 mg total) by mouth daily. Swallow whole., Disp: , Rfl:    atenolol (TENORMIN) 100 MG tablet, Take 100 mg by mouth 2 (two) times daily., Disp: , Rfl:    CALCIUM PO, Take 1 tablet by mouth daily., Disp: , Rfl:    Cholecalciferol (VITAMIN D) 2000 units tablet, Take 2,000 Units by mouth daily., Disp: , Rfl:    fenofibrate 160 MG tablet, Take 160 mg by mouth daily with supper. , Disp: , Rfl:    hydrocortisone 2.5 % cream, Apply 1 application  topically 2 (two) times daily as needed (rash)., Disp: , Rfl:    levothyroxine (SYNTHROID) 125 MCG tablet, TAKE 1 TABLET EVERY DAY, Disp: 90 tablet, Rfl: 3   Multiple Vitamin (MULTIVITAMIN WITH  MINERALS) TABS tablet, Take 1 tablet by mouth daily., Disp: , Rfl:    omeprazole (PRILOSEC) 20 MG capsule, Take 20 mg by mouth daily before breakfast. , Disp: , Rfl:    rosuvastatin (CRESTOR) 10 MG tablet, Take 10 mg by mouth daily. , Disp: , Rfl:    albuterol (VENTOLIN HFA) 108 (90 Base) MCG/ACT inhaler, 2 puffs every 6 (six) hours as needed. (Patient not taking: Reported on 10/15/2023), Disp: , Rfl:   Allergies: No Known Allergies  Past Medical History, Surgical history, Social history, and Family History were reviewed and updated.  Review of Systems: Review of Systems  Constitutional: Negative.   HENT:  Negative.    Eyes: Negative.   Respiratory: Negative.    Cardiovascular: Negative.   Gastrointestinal: Negative.  Negative for abdominal distention.  Endocrine: Negative.   Genitourinary: Negative.    Musculoskeletal:  Positive for gait problem.  Skin: Negative.   Neurological:  Positive for gait problem.  Hematological: Negative.   Psychiatric/Behavioral: Negative.      Physical Exam:  weight is 249 lb 1.9 oz (113 kg). His oral temperature is 98.3 F (36.8 C). His blood pressure is 119/70 and his pulse is 55 (abnormal). His respiration is 24 (abnormal) and oxygen saturation is 97%.  Wt Readings from Last 3 Encounters:  10/15/23 249 lb 1.9 oz (113 kg)  07/26/23 250 lb 12.8 oz (113.8 kg)  07/06/23 256 lb 6.4 oz (116.3 kg)    Physical Exam Vitals reviewed.  HENT:     Head: Normocephalic and atraumatic.  Eyes:     Pupils: Pupils are equal, round, and reactive to light.  Cardiovascular:     Rate and Rhythm: Normal rate and regular rhythm.     Heart sounds: Normal heart sounds.  Pulmonary:     Effort: Pulmonary effort is normal.     Breath sounds: Normal breath sounds.  Abdominal:     General: Bowel sounds are normal.     Palpations: Abdomen is soft.  Musculoskeletal:        General: No tenderness or deformity. Normal range of motion.     Cervical back: Normal range  of motion.  Lymphadenopathy:     Cervical: No cervical adenopathy.  Skin:    General: Skin is warm and dry.     Findings: No erythema or rash.  Neurological:     Mental Status: He is alert and oriented to person, place, and time.  Psychiatric:        Behavior: Behavior normal.        Thought Content: Thought content normal.        Judgment: Judgment normal.    Lab Results  Component Value Date   WBC 6.8 10/15/2023   HGB 14.8 10/15/2023   HCT 45.7 10/15/2023   MCV 95.2 10/15/2023   PLT 235 10/15/2023     Chemistry      Component Value Date/Time   NA 143 10/15/2023 0926   K 4.8 10/15/2023 0926   CL 105 10/15/2023 0926   CO2 31 10/15/2023 0926   BUN 19 10/15/2023 0926   CREATININE 1.14 10/15/2023 0926      Component Value Date/Time   CALCIUM 8.8 (L) 10/15/2023 0926   ALKPHOS 25 (L) 10/15/2023 0926   AST 15 10/15/2023 0926   ALT 10 10/15/2023 0926   BILITOT 0.4 10/15/2023 0926      Impression and Plan: Mr. Hanek is a very nice 67 year old white male.  He had a non small cell lung cancer of the left upper lung.  This was an adenocarcinoma.  It was a low-grade adenocarcinoma based on the low level of activity on the PET scan.  He underwent radiosurgery for this.  He had this back in 2021.  We will slow to follow along with his scans.  Will go ahead and do another CT scan on him in 6 months.  I will plan to see him back after the Holiday season and after Winter.     Josph Macho, MD  11/15/202410:27 AM

## 2023-10-22 ENCOUNTER — Telehealth: Payer: Self-pay | Admitting: Cardiology

## 2023-10-22 NOTE — Telephone Encounter (Signed)
Patient is calling to follow up on if he needs to have his CTA Head/Neck scan. Patient stated he would like to do it by the end of this year if needed due to meeting his out of pocket cost. Please advise.

## 2023-10-22 NOTE — Telephone Encounter (Signed)
They are calling wanting you to put order back in for CT. Pt is wanting to schedule

## 2023-10-25 NOTE — Telephone Encounter (Signed)
Called to inform him when he comes for his six month visit on 01/12/24 for his follow up, The provide will discuss and order new CT of the neck and head. Pt verbalized understanding. Pt had no questions at this time but was encouraged to call back if questions arise.

## 2023-11-05 ENCOUNTER — Ambulatory Visit (INDEPENDENT_AMBULATORY_CARE_PROVIDER_SITE_OTHER): Payer: Medicare HMO

## 2023-11-05 DIAGNOSIS — I639 Cerebral infarction, unspecified: Secondary | ICD-10-CM | POA: Diagnosis not present

## 2023-11-05 LAB — CUP PACEART REMOTE DEVICE CHECK
Date Time Interrogation Session: 20241205231329
Implantable Pulse Generator Implant Date: 20240514

## 2023-11-05 NOTE — Progress Notes (Signed)
[facility] Summary Report / Loop Recorder 

## 2023-11-09 DIAGNOSIS — Z79899 Other long term (current) drug therapy: Secondary | ICD-10-CM | POA: Diagnosis not present

## 2023-12-10 ENCOUNTER — Ambulatory Visit: Payer: Medicare HMO

## 2023-12-10 DIAGNOSIS — I639 Cerebral infarction, unspecified: Secondary | ICD-10-CM

## 2023-12-10 LAB — CUP PACEART REMOTE DEVICE CHECK
Date Time Interrogation Session: 20250109231607
Implantable Pulse Generator Implant Date: 20240514

## 2023-12-13 ENCOUNTER — Encounter: Payer: Self-pay | Admitting: Cardiology

## 2023-12-14 ENCOUNTER — Ambulatory Visit: Payer: Medicare HMO | Admitting: Cardiology

## 2023-12-15 DIAGNOSIS — R259 Unspecified abnormal involuntary movements: Secondary | ICD-10-CM | POA: Diagnosis not present

## 2023-12-15 DIAGNOSIS — F1021 Alcohol dependence, in remission: Secondary | ICD-10-CM | POA: Diagnosis not present

## 2023-12-15 DIAGNOSIS — K7460 Unspecified cirrhosis of liver: Secondary | ICD-10-CM | POA: Diagnosis not present

## 2023-12-15 DIAGNOSIS — C3492 Malignant neoplasm of unspecified part of left bronchus or lung: Secondary | ICD-10-CM | POA: Diagnosis not present

## 2023-12-15 DIAGNOSIS — I10 Essential (primary) hypertension: Secondary | ICD-10-CM | POA: Diagnosis not present

## 2023-12-15 DIAGNOSIS — Z85850 Personal history of malignant neoplasm of thyroid: Secondary | ICD-10-CM | POA: Diagnosis not present

## 2023-12-15 DIAGNOSIS — J449 Chronic obstructive pulmonary disease, unspecified: Secondary | ICD-10-CM | POA: Diagnosis not present

## 2023-12-15 DIAGNOSIS — F321 Major depressive disorder, single episode, moderate: Secondary | ICD-10-CM | POA: Diagnosis not present

## 2023-12-16 DIAGNOSIS — H5203 Hypermetropia, bilateral: Secondary | ICD-10-CM | POA: Diagnosis not present

## 2023-12-16 DIAGNOSIS — H2513 Age-related nuclear cataract, bilateral: Secondary | ICD-10-CM | POA: Diagnosis not present

## 2023-12-16 DIAGNOSIS — Z0100 Encounter for examination of eyes and vision without abnormal findings: Secondary | ICD-10-CM | POA: Diagnosis not present

## 2023-12-17 ENCOUNTER — Encounter: Payer: Self-pay | Admitting: Cardiology

## 2023-12-17 ENCOUNTER — Ambulatory Visit: Payer: Medicare HMO | Attending: Cardiology | Admitting: Cardiology

## 2023-12-17 ENCOUNTER — Telehealth: Payer: Self-pay | Admitting: Cardiology

## 2023-12-17 VITALS — BP 130/78 | HR 60 | Ht 72.0 in | Wt 228.4 lb

## 2023-12-17 DIAGNOSIS — I6930 Unspecified sequelae of cerebral infarction: Secondary | ICD-10-CM

## 2023-12-17 DIAGNOSIS — G4739 Other sleep apnea: Secondary | ICD-10-CM

## 2023-12-17 DIAGNOSIS — C73 Malignant neoplasm of thyroid gland: Secondary | ICD-10-CM

## 2023-12-17 DIAGNOSIS — G4733 Obstructive sleep apnea (adult) (pediatric): Secondary | ICD-10-CM

## 2023-12-17 DIAGNOSIS — I10 Essential (primary) hypertension: Secondary | ICD-10-CM | POA: Diagnosis not present

## 2023-12-17 NOTE — Progress Notes (Signed)
Cardiology Office Note:    Date:  12/17/2023   ID:  Timothy Conway, DOB 06-06-1956, MRN [id number]  PCP:  [name], MD  Cardiologist:  [name], MD    Referring MD: [name], MD   Chief Complaint  Patient presents with   Insomnia   PAC's    History of Present Illness:    Timothy Conway is a 68 y.o. 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  Comes today to months for follow-up cardiac wise doing fine he does have 2 complaints first is the fact he cannot sleep he was giving Ambien with limited success second complaint is uncontrollable motion of his jaw with making difficult to eat.  Denies have any cardiac complaints there is no chest pain tightness squeezing pressure burning chest.  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) 06/05/2023   Altered mental status, unspecified 06/05/2023   Anxiety    Cerebral infarction, unspecified (HCC) 06/05/2023   Cerebral ischemia 06/05/2023   COPD (chronic obstructive pulmonary disease) (HCC)    Depression    Dry eye syndrome    Dyslipidemia 01/05/2023   Dysrhythmia    hx of PAC's   Effusion of elbow joint, right 06/29/2017   Gastric ulcer with perforation but without obstruction (HCC) 04/15/2016   GERD (gastroesophageal reflux disease)    Gout    HTN (hypertension), benign 02/18/2023   Hyperlipidemia    Hypertension    Hypothyroidism    Hypothyroidism, unspecified 06/05/2023   Malignant neoplasm of thyroid gland (HCC) 06/05/2023   Muscle wasting and atrophy, not elsewhere classified, unspecified site 06/07/2023   Muscle  wasting and atrophy, not elsewhere classified, unspecified site 06/07/2023   Nodule of left lung 02/18/2023   Non-restorative sleep 02/18/2023   OSA (obstructive sleep apnea) 02/18/2023   Papillary thyroid carcinoma s/p total thyroidectomy & CLND 08/27/2017 08/24/2017   Penetrating ulcer of aorta (HCC) 02/18/2023   Postsurgical hypothyroidism 10/01/2017   Protrusion of intervertebral disc of thoracic region 02/18/2023   Seborrheic dermatitis 02/18/2023   Thyroid cancer (HCC)    2018   Wheezing 06/05/2023    Past Surgical History:  Procedure Laterality Date   ABDOMINAL SURGERY     hemmoraging ulcer   BRONCHIAL BIOPSY  06/25/2020   Procedure: BRONCHIAL BIOPSIES;  Surgeon: [name], MD;  Location: [facility] ENDOSCOPY;  Service: Pulmonary;;   BRONCHIAL BRUSHINGS  06/25/2020   Procedure: BRONCHIAL BRUSHINGS;  Surgeon: [name], MD;  Location: [facility] ENDOSCOPY;  Service: Pulmonary;;   BRONCHIAL NEEDLE ASPIRATION BIOPSY  06/25/2020   Procedure: BRONCHIAL NEEDLE ASPIRATION BIOPSIES;  Surgeon: [name], MD;  Location: [facility] ENDOSCOPY;  Service: Pulmonary;;   BRONCHIAL WASHINGS  06/25/2020   Procedure: BRONCHIAL WASHINGS;  Surgeon: [name], MD;  Location: [facility] ENDOSCOPY;  Service: Pulmonary;;   CATARACT EXTRACTION Right    COLONOSCOPY     EYE SURGERY Right    x 4  between ages 19-13   FIDUCIAL MARKER PLACEMENT  06/25/2020   Procedure: FIDUCIAL MARKER PLACEMENT;  Surgeon: [name], MD;  Location: [facility] ENDOSCOPY;  Service: Pulmonary;;   THYROIDECTOMY N/A 08/27/2017   Procedure: TOTAL THYROIDECTOMY WITH LIMITED LYMPH NODE DISSECTION;  Surgeon: Gerrit Friends,  Tawanna Cooler, MD;  Location: [facility] ORS;  Service: General;  Laterality: N/A;   UPPER GI ENDOSCOPY     VIDEO BRONCHOSCOPY WITH ENDOBRONCHIAL NAVIGATION N/A 06/25/2020   Procedure: VIDEO BRONCHOSCOPY WITH ENDOBRONCHIAL NAVIGATION;  Surgeon: [name], MD;  Location: [facility] ENDOSCOPY;  Service: Pulmonary;  Laterality: N/A;    Current  Medications: Current Meds  Medication Sig   albuterol (VENTOLIN HFA) 108 (90 Base) MCG/ACT inhaler Inhale 2 puffs into the lungs every 6 (six) hours as needed for wheezing or shortness of breath.   alendronate (FOSAMAX) 70 MG tablet TAKE 1 TABLET EVERY 7 DAYS WITH A FULL GLASS OF WATER ON AN EMPTY STOMACH. (Patient taking differently: Take 70 mg by mouth once a week.)   aspirin EC 81 MG tablet Take 1 tablet (81 mg total) by mouth daily. Swallow whole.   atenolol (TENORMIN) 100 MG tablet Take 100 mg by mouth 2 (two) times daily.   CALCIUM PO Take 1 tablet by mouth daily.   Cholecalciferol (VITAMIN D) 2000 units tablet Take 2,000 Units by mouth daily.   desvenlafaxine (PRISTIQ) 50 MG 24 hr tablet Take 50 mg by mouth daily.   fenofibrate 160 MG tablet Take 160 mg by mouth daily with supper.    hydrocortisone 2.5 % cream Apply 1 application  topically 2 (two) times daily as needed (rash).   levothyroxine (SYNTHROID) 125 MCG tablet TAKE 1 TABLET EVERY DAY   Multiple Vitamin (MULTIVITAMIN WITH MINERALS) TABS tablet Take 1 tablet by mouth daily.   omeprazole (PRILOSEC) 20 MG capsule Take 20 mg by mouth daily before breakfast.    rosuvastatin (CRESTOR) 10 MG tablet Take 10 mg by mouth daily.    zolpidem (AMBIEN) 5 MG tablet Take 5 mg by mouth at bedtime as needed for sleep.     Allergies:   Patient has no known allergies.   Social History   Socioeconomic History   Marital status: Single    Spouse name: Not on file   Number of children: Not on file   Years of education: Not on file   Highest education level: Not on file  Occupational History   Not on file  Tobacco Use   Smoking status: Former    Current packs/day: 0.00    Average packs/day: 1 pack/day for 40.0 years (40.0 ttl pk-yrs)    Types: Cigarettes    Start date: 05/1979    Quit date: 05/2019    Years since quitting: 4.6   Smokeless tobacco: Never  Vaping Use   Vaping status: Never Used  Substance and Sexual Activity   Alcohol  use: Not Currently    Alcohol/week: 0.0 standard drinks of alcohol    Comment: Used to drink a lot -moderate - quit 2020   Drug use: Not Currently    Types: Marijuana    Comment: last use years ago   Sexual activity: Not Currently  Other Topics Concern   Not on file  Social History Narrative   Not on file   Social Drivers of Health   Financial Resource Strain: Not on file  Food Insecurity: Not on file  Transportation Needs: Not on file  Physical Activity: Not on file  Stress: Not on file  Social Connections: Unknown (03/30/2022)   Received from [facility]   Social Network    Social Network: Not on file     Family History: The patient's family history is not on file. He was adopted. ROS:   Please see the history of  present illness.    All 14 point review of systems negative except as described per history of present illness  EKGs/Labs/Other Studies Reviewed:    EKG Interpretation Date/Time:  Friday December 17 2023 11:27:19 EST Ventricular Rate:  62 PR Interval:  178 QRS Duration:  102 QT Interval:  418 QTC Calculation: 424 R Axis:   -83  Text Interpretation: Normal sinus rhythm Incomplete right bundle branch block Left anterior fascicular block When compared with ECG of 25-Jun-2020 06:54, Left anterior fascicular block is now Present Incomplete right bundle branch block is now Present Confirmed by [name] [phone]) on 12/17/2023 11:59:18 AM    Recent Labs: 07/26/2023: TSH 1.87 10/15/2023: ALT 10; BUN 19; Creatinine 1.14; Hemoglobin 14.8; Platelet Count 235; Potassium 4.8; Sodium 143  Recent Lipid Panel    Component Value Date/Time   CHOL 149 02/18/2023 1123   TRIG 167 (H) 02/18/2023 1123   HDL 45 02/18/2023 1123   CHOLHDL 3.3 02/18/2023 1123   LDLCALC 75 02/18/2023 1123    Physical Exam:    VS:  BP 130/78 (BP Location: Right Arm, Patient Position: Sitting)   Pulse 60   Ht 6' (1.829 m)   Wt 228 lb 6.4 oz (103.6 kg)   SpO2 96%   BMI  30.98 kg/m     Wt Readings from Last 3 Encounters:  12/17/23 228 lb 6.4 oz (103.6 kg)  10/15/23 249 lb 1.9 oz (113 kg)  07/26/23 250 lb 12.8 oz (113.8 kg)     GEN:  Well nourished, well developed in no acute distress HEENT: Normal NECK: No JVD; No carotid bruits LYMPHATICS: No lymphadenopathy CARDIAC: RRR, no murmurs, no rubs, no gallops RESPIRATORY:  Clear to auscultation without rales, wheezing or rhonchi  ABDOMEN: Soft, non-tender, non-distended MUSCULOSKELETAL:  No edema; No deformity  SKIN: Warm and dry LOWER EXTREMITIES: no swelling NEUROLOGIC:  Alert and oriented x 3 PSYCHIATRIC:  Normal affect   ASSESSMENT:    1. HTN (hypertension), benign   2. OSA (obstructive sleep apnea)   3. Malignant neoplasm of thyroid gland (HCC)   4. Late effect of cerebrovascular accident (CVA)   5. Sleep apnea-like behavior    PLAN:    In order of problems listed above:  Late effect of CVA.  Stable from that point review on appropriate medications.  He does have implantable loop recorder which so far did not reveal any atrial fibrillation Obstructive sleep apnea apparently he does not have it he tells me today.  He is scheduled to see neurologist for his uncontrollable motion of the jaw there will be also issue of his sleep addressed. Essential hypertension blood pressure normal controlled continue present management. Dyslipidemia I did review K PN which show LDL 56 HDL 46 good control continue present management   Medication Adjustments/Labs and Tests Ordered: Current medicines are reviewed at length with the patient today.  Concerns regarding medicines are outlined above.  Orders Placed This Encounter  Procedures   EKG 12-Lead   Medication changes: No orders of the defined types were placed in this encounter.   Signed, Georgeanna Lea, MD, [facility] 12/17/2023 12:12 PM    [facility]

## 2023-12-17 NOTE — Telephone Encounter (Signed)
Pt states that dr Bing Matter recommended he see a neurologist and wants to know if he will be sending a referral. Requesting cb

## 2023-12-17 NOTE — Telephone Encounter (Signed)
Spoke with patient, aware he has an appt with neurologist office with NP Ihor Austin. Advised of appt date and time.

## 2023-12-17 NOTE — Patient Instructions (Signed)
 Medication Instructions:  Your physician recommends that you continue on your current medications as directed. Please refer to the Current Medication list given to you today.  *If you need a refill on your cardiac medications before your next appointment, please call your pharmacy*   Lab Work: None Ordered If you have labs (blood work) drawn today and your tests are completely normal, you will receive your results only by: MyChart Message (if you have MyChart) OR A paper copy in the mail If you have any lab test that is abnormal or we need to change your treatment, we will call you to review the results.   Testing/Procedures: None Ordered   Follow-Up: At [facility], you and your health needs are our priority.  As part of our continuing mission to provide you with exceptional heart care, we have created designated Provider Care Teams.  These Care Teams include your primary Cardiologist (physician) and Advanced Practice Providers (APPs -  Physician Assistants and Nurse Practitioners) who all work together to provide you with the care you need, when you need it.  We recommend signing up for the patient portal called "MyChart".  Sign up information is provided on this After Visit Summary.  MyChart is used to connect with patients for Virtual Visits (Telemedicine).  Patients are able to view lab/test results, encounter notes, upcoming appointments, etc.  Non-urgent messages can be sent to your provider as well.   To learn more about what you can do with MyChart, go to [email or web address].    Your next appointment:    1 year follow up

## 2023-12-22 ENCOUNTER — Encounter: Payer: Self-pay | Admitting: Cardiovascular Disease

## 2023-12-27 ENCOUNTER — Other Ambulatory Visit: Payer: Self-pay | Admitting: Internal Medicine

## 2023-12-27 DIAGNOSIS — C73 Malignant neoplasm of thyroid gland: Secondary | ICD-10-CM

## 2024-01-12 ENCOUNTER — Telehealth: Payer: Self-pay

## 2024-01-12 ENCOUNTER — Encounter: Payer: Self-pay | Admitting: Adult Health

## 2024-01-12 ENCOUNTER — Ambulatory Visit: Payer: Medicare HMO | Admitting: Adult Health

## 2024-01-12 VITALS — BP 125/64 | HR 57 | Ht 72.0 in | Wt 230.0 lb

## 2024-01-12 DIAGNOSIS — F418 Other specified anxiety disorders: Secondary | ICD-10-CM

## 2024-01-12 DIAGNOSIS — I639 Cerebral infarction, unspecified: Secondary | ICD-10-CM

## 2024-01-12 DIAGNOSIS — G4700 Insomnia, unspecified: Secondary | ICD-10-CM

## 2024-01-12 DIAGNOSIS — G249 Dystonia, unspecified: Secondary | ICD-10-CM | POA: Diagnosis not present

## 2024-01-12 DIAGNOSIS — I6523 Occlusion and stenosis of bilateral carotid arteries: Secondary | ICD-10-CM | POA: Diagnosis not present

## 2024-01-12 DIAGNOSIS — I6501 Occlusion and stenosis of right vertebral artery: Secondary | ICD-10-CM

## 2024-01-12 DIAGNOSIS — R259 Unspecified abnormal involuntary movements: Secondary | ICD-10-CM | POA: Diagnosis not present

## 2024-01-12 DIAGNOSIS — G478 Other sleep disorders: Secondary | ICD-10-CM | POA: Diagnosis not present

## 2024-01-12 DIAGNOSIS — F1021 Alcohol dependence, in remission: Secondary | ICD-10-CM | POA: Diagnosis not present

## 2024-01-12 NOTE — Patient Instructions (Addendum)
Your Plan:  Referral placed to psychiatry for further management of depression/anxiety and insomnia management   Will follow up with Dr. Pearlean Brownie regarding further recommendations - I will keep you updated on MyChart regarding his recommendations   We will check some blood work today  Order will be placed to repeat CTA head/neck to follow-up on right vertebral artery stenosis and left carotid artery stenosis - you will be called to schedule     Follow up with Dr. Pearlean Brownie in 3 months or call earlier if needed     Thank you for coming to see [place] at [facility]. I hope we have been able to provide you high quality care today.  You may receive a patient satisfaction survey over the next few weeks. We would appreciate your feedback and comments so that we may continue to improve ourselves and the health of our patients.

## 2024-01-12 NOTE — Telephone Encounter (Signed)
Referral sent to [facility] (P) [phone]

## 2024-01-12 NOTE — Progress Notes (Addendum)
 [facility] [address] [place]. [place] [address] [phone]       OFFICE FOLLOW UP NOTE  [name] Date of Birth:  1956/04/09 Medical Record Number:  [id number]   Primary neurologist: Dr. Pearlean Conway Reason for visit: Stroke follow-up  Chief Complaint  Patient presents with   Follow-up    Pt in room 3 alone. Here for stroke follow up. Pt reports doing well. Pt said he has mouth and leg involuntary movement. Pt said bilateral leg movement effects he sleep, c/o jaw soreness. Pt said memory said is fine. No more brain fog.       HPI:   Update 01/12/2024 JM: Patient returns for 69-month follow-up visit.  His main concern today is in regards to involuntary movements that started about 4 to 5 months ago, initially present in his jaw and gradually started affecting his legs and now affecting his arms.  He has had difficulty eating due to jaw movement and pain as well as affecting his ability to speak, leg movements can become painful especially at night with a severe cramping sensation.  Reports jaw movements disappear during sleep.  Involuntary movements are present all day long but usually overall mild during the day.  Denies any weakness or changes in his gait, does have chronic gait impairment and imbalance after his stroke in 10/2022 but this has been stable.  He was having slight upper extremity tremor initially but this seems to have subsided.  Unsure family history of similar symptoms as he was adopted.  He was started on Pristiq about 3 months ago which has helped reduce panic attacks but continues to struggle with depression/anxiety.  He also continues to struggle with insomnia, use of Ambien with some benefit. He was previously on Xanax 6mg  daily for multiple years but this was stopped back in 05/2023 after ED visit for altered mental status.  He also reports complete EtOH cessation since that time.  He was previously followed by psychiatry but stopped after  Xanax was discontinued.  Denies starting any new medications around the time of onset.  Overall stable from stroke standpoint.  Denies any current memory concerns, feels brain fog has improved.  Remains on aspirin and Crestor.  Routinely follows with PCP and cardiology for stroke risk factor management.  Loop recorder has not shown atrial fibrillation thus far.  Previously discussed follow-up with [facility] for untreated sleep apnea but feels he no longer has issues with apnea since he stopped EtOH use.       History provided for reference purposes only Update 07/06/2023 JM: Patient returns for follow-up visit unaccompanied.  Overall stable without new stroke/TIA symptoms.  Reports residual imbalance, denies much change since prior visit.  Was working with PT/OT back in May/June, is scheduled 8/14 for PT reevaluation. Does need to use door frames and wall to ambulate, does have RW at home but does not routinely use, denies any recent falls.  Also continued short-term memory issues, denies any worsening since prior visit.  Remains on Crestor without side effects. Reports stopping aspirin and being placed on Eliquis but ran out of prescription yesterday.  Patient unsure why he was placed on Eliquis, reports was started after hospital stay in June (see below) but per note review, no mention of Eliquis.  Cardiac monitor negative for A-fib, loop recorder placed on 5/14 without evidence of A-fib thus far. Routinely follows with PCP for stroke risk factor management.  Evaluated at [facility] ED on 6/30 with  altered mental status.  Per ED note, patient confused with delusional statements.  Reported last drink 3 weeks ago however friend stated last drink only 3 days PTA, prior to alcohol cessation patient drinking 9-10 beers daily.  Extensive workup largely unremarkable.  Placed on Librium taper with significant for mild symptoms.  He was evaluated by psychiatry who noted long-term use of Xanax recently  discontinued which may also be contributing.  Xanax restarted and Librium discontinued.  CT head negative for acute findings.  Felt symptoms likely in setting of alcohol and benzo withdrawal. Was d/c'd to SNF rehab, returned back home last week.  Reports complete EtOH cessation since discharge, reports drinking excessive alcohol (9-10 beers daily) over the past 30 years.  Denies any tobacco or drug use.  Consult visit 02/18/2023 Dr. Pearlean Conway: [name] is a 68 year old Caucasian male seen today for initial office consultation visit for stroke.  History is obtained from the patient and review of collateral notes and electronic medical records.  I have personally reviewed pertinent available imaging films in PACS.  He has past medical history of hypertension, hyperlipidemia, obstructive sleep apnea, depression, COPD, lung adenocarcinoma, gastroesophageal reflux disease, gout, thyroid cancer.  Patient states for the last 6 months or so his benign send gait and balance difficulties.  He states this began suddenly 1 day when he woke up.  Trouble getting out of bed and he fell down.  He was told that he was walking like a drunk.  He did not seek medical help at that time.  The last few months he feels his gait and balance are improving.  He underwent MRI scan of the brain on 11/07/2022 which showed a 1 cm right cerebellar cortical and white matter acute infarct as well as remote age bilateral basal ganglia and corona radiata lacunar infarcts.  There is mild changes of small vessel disease.  Patient started on aspirin for stroke prevention since which is tolerating well without bruising or bleeding.  He is also on Crestor tolerating well.  His blood pressure is well-controlled today it is 130/67.  He states that he never been diagnosed with sleep apnea though I find the diagnosis listed past medical history.  He has had no further strokelike symptoms.  Denies any history of syncopal events, palpitations or atrial  fibrillation.  He does admit to mild short-term memory and cognitive difficulties.  He lives alone and manages a normal.  Echocardiogram on 11/2522 showed ejection fraction of 65%.  There is mild left atrial dilatation.  Carotid ultrasound showed no significant extracranial stenosis bilaterally.  4 weeks cardiac external monitor showed no evidence of atrial fibrillation with brief runs of supraventricular tachycardia and isolated supraventricular ectopics.  ROS:   14 system review of systems is positive for those listed in HPI and all other systems negative  PMH:  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) 06/05/2023   Altered mental status, unspecified 06/05/2023   Anxiety    Cerebral infarction, unspecified (HCC) 06/05/2023   Cerebral ischemia 06/05/2023   COPD (chronic obstructive pulmonary disease) (HCC)    Depression    Dry eye syndrome    Dyslipidemia 01/05/2023   Dysrhythmia    hx of PAC's   Effusion of elbow joint, right 06/29/2017   Gastric ulcer with perforation but without obstruction (HCC) 04/15/2016  GERD (gastroesophageal reflux disease)    Gout    HTN (hypertension), benign 02/18/2023   Hyperlipidemia    Hypertension    Hypothyroidism    Hypothyroidism, unspecified 06/05/2023   Malignant neoplasm of thyroid gland (HCC) 06/05/2023   Muscle wasting and atrophy, not elsewhere classified, unspecified site 06/07/2023   Muscle wasting and atrophy, not elsewhere classified, unspecified site 06/07/2023   Nodule of left lung 02/18/2023   Non-restorative sleep 02/18/2023   OSA (obstructive sleep apnea) 02/18/2023   Papillary thyroid carcinoma s/p total thyroidectomy & CLND 08/27/2017 08/24/2017   Penetrating ulcer of aorta (HCC)  02/18/2023   Postsurgical hypothyroidism 10/01/2017   Protrusion of intervertebral disc of thoracic region 02/18/2023   Seborrheic dermatitis 02/18/2023   Thyroid cancer (HCC)    2018   Wheezing 06/05/2023    Social History:  Social History   Socioeconomic History   Marital status: Single    Spouse name: Not on file   Number of children: Not on file   Years of education: Not on file   Highest education level: Not on file  Occupational History   Not on file  Tobacco Use   Smoking status: Former    Current packs/day: 0.00    Average packs/day: 1 pack/day for 40.0 years (40.0 ttl pk-yrs)    Types: Cigarettes    Start date: 05/1979    Quit date: 05/2019    Years since quitting: 4.7   Smokeless tobacco: Never  Vaping Use   Vaping status: Never Used  Substance and Sexual Activity   Alcohol use: Not Currently    Alcohol/week: 0.0 standard drinks of alcohol    Comment: Used to drink a lot -moderate - quit 2020   Drug use: Not Currently    Types: Marijuana    Comment: last use years ago   Sexual activity: Not Currently  Other Topics Concern   Not on file  Social History Narrative   Not on file   Social Drivers of Health   Financial Resource Strain: Not on file  Food Insecurity: Not on file  Transportation Needs: Not on file  Physical Activity: Not on file  Stress: Not on file  Social Connections: Unknown (03/30/2022)   Received from [facility], [facility]   Social Network    Social Network: Not on file  Intimate Partner Violence: Unknown (03/06/2022)   Received from [facility], [facility]   HITS    Physically Hurt: Not on file    Insult or Talk Down To: Not on file    Threaten Physical Harm: Not on file    Scream or Curse: Not on file    Medications:   Current Outpatient Medications on File Prior to Visit  Medication Sig Dispense Refill   alendronate (FOSAMAX) 70 MG tablet TAKE 1 TABLET EVERY 7 DAYS WITH A FULL GLASS OF WATER ON AN EMPTY STOMACH. 12  tablet 3   aspirin EC 81 MG tablet Take 1 tablet (81 mg total) by mouth daily. Swallow whole.     atenolol (TENORMIN) 100 MG tablet Take 100 mg by mouth 2 (two) times daily.     CALCIUM PO Take 1 tablet by mouth daily.     Cholecalciferol (VITAMIN D) 2000 units tablet Take 2,000 Units by mouth daily.     desvenlafaxine (PRISTIQ) 50 MG 24 hr tablet Take 50 mg by mouth daily.     fenofibrate 160 MG tablet Take 160 mg by mouth daily with supper.  hydrocortisone 2.5 % cream Apply 1 application  topically 2 (two) times daily as needed (rash).     levothyroxine (SYNTHROID) 125 MCG tablet TAKE 1 TABLET EVERY DAY 90 tablet 3   Multiple Vitamin (MULTIVITAMIN WITH MINERALS) TABS tablet Take 1 tablet by mouth daily.     omeprazole (PRILOSEC) 20 MG capsule Take 20 mg by mouth daily before breakfast.      rosuvastatin (CRESTOR) 10 MG tablet Take 10 mg by mouth daily.      zolpidem (AMBIEN) 5 MG tablet Take 5 mg by mouth at bedtime as needed for sleep.     albuterol (VENTOLIN HFA) 108 (90 Base) MCG/ACT inhaler Inhale 2 puffs into the lungs every 6 (six) hours as needed for wheezing or shortness of breath. (Patient not taking: Reported on 01/12/2024)     No current facility-administered medications on file prior to visit.    Allergies:   Allergies  Allergen Reactions   Lorazepam Anxiety and Hypertension     Physical Exam Today's Vitals   01/12/24 1313  BP: 125/64  Pulse: (!) 57  Weight: 230 lb (104.3 kg)  Height: 6' (1.829 m)   Body mass index is 31.19 kg/m.  General: Very pleasant obese unkempt middle-aged Caucasian male, seated, in no evident distress  Neurologic Exam Mental Status: Awake and fully alert. Oriented to place and time. Recent memory mildly impaired and remote memory intact. Attention span, concentration and fund of knowledge mostly appropriate. Mood and affect appropriate.  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   Extraocular movements full without nystagmus. Visual fields full to confrontation in the left eye.Marland Kitchen Hearing intact. Facial sensation intact. Face, tongue, palate moves normally and symmetrically.  Motor: Normal bulk and tone. Normal strength in all tested extremity muscles.  Consistent involuntary mild movements of jaw, head and bilateral lower extremities.  No evidence of resting or action tremor. Sensory.: intact to touch , pinprick , position and vibratory sensation.  Coordination: Rapid alternating movements normal in all extremities. Finger-to-nose and heel-to-shin performed accurately bilaterally.   Gait and Station: Arises from chair without difficulty. Stance is normal. Gait is slightly broad-based demonstrates with moderate unsteadiness, adequate stride length although decreased appetite.  Greater difficulty with turns. No use of AD.  Tandem walk and heel toe not attempted. Reflexes: 1+ and symmetric. Toes downgoing.      07/06/2023    2:41 PM  MMSE - Mini Mental State Exam  Orientation to time 3  Orientation to Place 5  Registration 3  Attention/ Calculation 5  Recall 3  Language- name 2 objects 2  Language- repeat 1  Language- follow 3 step command 3  Language- read & follow direction 1  Write a sentence 1  Copy design 0  Total score 27       ASSESSMENT: 68 year old Caucasian male with subacute balance and gait difficulties for the last 6 months with abnormal MRI scan showing a subacute right cerebellar infarct in December 2023 s/p ILR as well as evidence of left basal ganglia, right corona radiata and lentiform chronic infarcts.  Vascular risk factors of obesity, hyperlipidemia, hypertension, at risk for obstructive sleep apnea, EtOH abuse and silent cerebral infarcts.  He also has mild cognitive impairment.  Today, reports 4 to 28-month onset of involuntary jaw movements which gradually progressed to bilateral lower extremity and more recently upper extremity  movements.     PLAN:  1.  Abnormal involuntary movements  -Unclear cause  -onset  correlates around the time of stopped Xanax and EtOH use, does have history of generalized anxiety with panic attacks and history of EtOH abuse, prior imaging also showed chronic left basal ganglia and right corona radiata infarcts  -was evaluated by Dr. Terrace Arabia who felt possibly related to anxiety, did not feel repeat imaging indicated   -Referral placed to psychiatry to reestablish care for further management of anxiety/depression as well as insomnia  -will check lab work for other causes   -he questions something to help with painful LE spasms that interfere with sleep, will consult with Dr. Pearlean Conway to see if he has any recommendations   -ADDENDUM 01/16/2023: Dr. Pearlean Conway recommends low-dose clonazepam at night to help with movements as well as anxiety. He is also currently on Ambien, will recommend holding medication while on clonazepam due to potential interaction, has only been on Ambien for 1 month, do not feel gradually reducing dosage in necessary  -request f/u with Dr. Pearlean Conway in 3 months for further evaluation   2. Right cerebellar infarct 3. Hx of stroke 4. R VA occlusion  -Continued imbalance and gait unsteadiness - overall stable -CTA head/neck 03/2023 showed right VA occlusion possibly responsible for stroke, also 50% narrowing of left ICA.  Continue medical therapy and aggressive stroke risk factor management. Will place order for repeat imaging -Continue aspirin and Crestor for secondary stroke prevention measures managed by PCP  -Continue close PCP follow-up for aggressive stroke risk factor management     Follow-up in 3 months with Dr. Pearlean Conway or call earlier if needed     I spent a prolonged 50 minutes of face-to-face and non-face-to-face time with patient.  This included previsit chart review, lab review, study review, order entry, electronic health record documentation, patient education and  discussion regarding above diagnoses and treatment plan and answered all other questions to patient's satisfaction  Ihor Austin, [facility]  [facility] [address] [place], [place] [address]  Phone [phone] Fax [phone] Note: This document was prepared with digital dictation and possible smart phrase technology. Any transcriptional errors that result from this process are unintentional.

## 2024-01-13 ENCOUNTER — Encounter: Payer: Self-pay | Admitting: Adult Health

## 2024-01-13 LAB — ANA W/REFLEX: Anti Nuclear Antibody (ANA): NEGATIVE

## 2024-01-13 LAB — RPR: RPR Ser Ql: NONREACTIVE

## 2024-01-13 LAB — SEDIMENTATION RATE: Sed Rate: 2 mm/h (ref 0–30)

## 2024-01-14 ENCOUNTER — Ambulatory Visit (INDEPENDENT_AMBULATORY_CARE_PROVIDER_SITE_OTHER): Payer: Medicare HMO

## 2024-01-14 DIAGNOSIS — I639 Cerebral infarction, unspecified: Secondary | ICD-10-CM

## 2024-01-14 NOTE — Progress Notes (Signed)
I agree with the above plan

## 2024-01-14 NOTE — Progress Notes (Signed)
[facility] Summary Report / Loop Recorder

## 2024-01-15 LAB — CUP PACEART REMOTE DEVICE CHECK
Date Time Interrogation Session: 20250213231153
Implantable Pulse Generator Implant Date: 20240514

## 2024-01-17 ENCOUNTER — Encounter: Payer: Self-pay | Admitting: Adult Health

## 2024-01-17 MED ORDER — CLONAZEPAM 0.5 MG PO TABS
0.5000 mg | ORAL_TABLET | Freq: Every day | ORAL | 0 refills | Status: DC
Start: 2024-01-17 — End: 2024-02-07

## 2024-01-17 NOTE — Addendum Note (Signed)
 Addended by: Ihor Austin L on: 01/17/2024 07:18 AM   Modules accepted: Orders

## 2024-01-18 ENCOUNTER — Telehealth: Payer: Self-pay | Admitting: Adult Health

## 2024-01-18 NOTE — Telephone Encounter (Signed)
 Francine Graven Berkley Harvey: [id number] exp. 01/18/24-03/18/24 sent to GI [phone]

## 2024-01-26 ENCOUNTER — Ambulatory Visit: Payer: Medicare HMO | Admitting: Internal Medicine

## 2024-01-26 ENCOUNTER — Encounter: Payer: Self-pay | Admitting: Internal Medicine

## 2024-01-26 VITALS — BP 120/70 | HR 58 | Ht 72.0 in | Wt 234.2 lb

## 2024-01-26 DIAGNOSIS — M810 Age-related osteoporosis without current pathological fracture: Secondary | ICD-10-CM | POA: Diagnosis not present

## 2024-01-26 DIAGNOSIS — E890 Postprocedural hypothyroidism: Secondary | ICD-10-CM

## 2024-01-26 DIAGNOSIS — E559 Vitamin D deficiency, unspecified: Secondary | ICD-10-CM

## 2024-01-26 DIAGNOSIS — C73 Malignant neoplasm of thyroid gland: Secondary | ICD-10-CM | POA: Diagnosis not present

## 2024-01-26 NOTE — Progress Notes (Addendum)
 Patient ID: Timothy Conway, male   DOB: 23-Dec-1955, 68 y.o.   MRN: [id number]   HPI  Timothy Conway is a 68 y.o.-year-old male, initially referred by Dr. Gerrit Conway, presenting for follow-up for papillary thyroid cancer, postsurgical hypothyroidism, and osteoporosis.  Last visit 1 year ago.  Interim history: In 2021, he was diagnosed with 11 mm left adenocarcinoma. He had radiotactic RxTx.  CT scan in 2021 showed a decrease in size of his tumor.  He continues to be followed by oncology. He has dystonia we have to be from higher doses of Xanax that he was on for alcoholism in the past (he quit drinking and smoking last year) >> started Klonopin. He was in PT for dystonia.  This is mostly affecting his mouth and hands.  ThyCA: Reviewed and addended his thyroid cancer history: Pt. has been found to have a hypermetabolic thyroid nodule on a PET scan (07/13/2017) obtained by Dr. Delton Conway, his pulmonologist, to investigate a lung nodule.  A thyroid U/S (07/30/2017) showed a L lobe 1.3 x 1.0 x 1.2 cm nodule, solid, hypoechoic, lobulated/irregular, with peripheral calcifications. Bx of this nodule (07/30/2017): PTC  Total thyroidectomy (08/27/2017) by Dr. Gerrit Conway: Diagnosis 1. Thyroid, thyroidectomy, total - PAPILLARY THYROID CARCINOMA, CLASSIC TYPE, SPANNING 1.4 CM. - NO EXTRATHYROIDAL EXTENSION. - RESECTION MARGINS ARE NEGATIVE. - SEE ONCOLOGY TABLE. 2. Lymph node, biopsy, central compartment - THREE OF THREE LYMPH NODES NEGATIVE FOR CARCINOMA (0/3). - ONE BENIGN PARATHYROID. Microscopic Comment 1. THYROID Specimen: Total thyroid with central compartment lymph nodes. Procedure (including lymph node sampling if applicable): Total thyroidectomy with central lymph node biopsy. Specimen Integrity (intact/fragmented): Intact. Tumor focality: Unifocal. Dominant tumor: Maximum tumor size (cm): 1.4 cm. Tumor laterality: Left. Histologic type (including subtype and/or unique features as applicable):  Papillary thyroid carcinoma, classic type. Tumor capsule: Present. Extrathyroidal extension: Absent. Capsular invasion with degree of invasion if present: N/A. Margins: Negative. Lymphatic or vascular invasion: Not identified. Lymph nodes: # examined 3; # positive; 0 Extracapsular extension (if applicable): N/A. TNM code: pT1b, pN0 Non-neoplastic thyroid: Largely unremarkable. Comments: There tumor is largely fibrotic and calcified with only a small portion of viable tumor.  Neck U/S (09/25/2019): The thyroid gland is surgically absent. Evaluation of the left and right resection bed demonstrates no evidence of residual thyroid tissue or nodularity. No suspicious lymphadenopathy is identified. IMPRESSION: Surgical changes of prior total thyroidectomy without evidence of residual or recurrent thyroid tissue, nodularity or lymphadenopathy.  PET/CT (09/15/2020): Increased uptake in the base of the tongue and anterior paravertebral muscles  CT neck (07/24/2020): Increased uptake is most likely related to tonsillar inflammation  CT chest (11/14/2020) without contrast: No suspicious masses in mediastinum or neck.  CT chest (06/24/2021): Mediastinum/Nodes: No enlarged mediastinal, hilar, or axillary lymph nodes. Thyroid gland, trachea, and esophagus demonstrate no significant findings.  CT chest (10/16/2021): Unchanged appearance of predominantly bandlike post radiation fibrosis and adjacent ground-glass about fiducial markers of the anterior left upper lobe. No evidence of recurrent or metastatic disease in the chest.  CT chest (04/15/2022): Mediastinum/Nodes: No pathologically enlarged mediastinal or hilar lymph nodes. Please note that accurate exclusion of hilar adenopathy is limited on noncontrast CT scans. Esophagus is unremarkable in appearance. No axillary lymphadenopathy.   Lungs/Pleura: Nodular area of architectural distortion surrounded by septal thickening and  ground-glass attenuation in the left upper lobe, adjacent to 2 fiducial markers, compatible with a treated neoplasm. This appears grossly stable compared to the prior study. No other new suspicious appearing pulmonary nodules or  masses are noted. When compared to the prior study there has been worsening scarring/atelectasis in the right middle lobe, predominantly in the lateral segment.   IMPRESSION: 1. Stable appearance of treated neoplasm in the left upper lobe. No definitive findings to suggest residual or metastatic disease in the thorax. 2. Worsening scarring/atelectasis in the lateral segment of the right middle lobe. 3. Aortic atherosclerosis. 4. Hepatic steatosis. 5. Additional incidental findings, as above.  CT chest (04/09/2023): 1. Unchanged post treatment/post radiation appearance of the anterior left upper lobe with irregular scarring and volume loss with biopsy marking clips and or fiducial markers. Most discrete residual nodule anteriorly measures 1.1 x 0.8 cm, unchanged. 2. No evidence of recurrent or metastatic disease in the chest. 3. Mildly coarse contour of the liver, suggestive of cirrhosis.  Thyroglobulin is detectable: Lab Results  Component Value Date   THYROGLB 0.2 (L) 07/26/2023   THYROGLB 0.2 (L) 06/05/2022   THYROGLB 0.3 (L) 09/11/2021   THYROGLB 0.1 (L) 10/29/2020   THYROGLB 0.7 (L) 09/17/2020   THYROGLB 0.1 (L) 09/14/2019   THYROGLB 0.2 (L) 09/08/2018   THYROGLB 0.1 (L) 03/23/2018   His antithyroid antibodies are undetectable: Lab Results  Component Value Date   THGAB <1 07/26/2023   THGAB <1 06/05/2022   THGAB <1 09/11/2021   THGAB <1 10/29/2020   THGAB <1 09/17/2020   THGAB <1 09/14/2019   THGAB <1 09/08/2018   THGAB <1 03/23/2018   Postsurgical hypothyroidism  He is on Levothyroxine 125 mcg daily (last dose decrease 09/2021): - in am - fasting - at least 30 min from b'fast (however, he usually skips breakfast) - + calcium 1x a  day with dinner - no iron - + multivitamins at night - + PPIs at night (Omeprazole) - not on Biotin + Magnesium and Zinc - at night.  Reviewed his TFTs: Lab Results  Component Value Date   TSH 1.87 07/26/2023   TSH 0.602 02/18/2023   TSH 1.60 06/05/2022   TSH 0.93 12/12/2021   TSH 0.19 (L) 10/28/2021   TSH 0.15 (L) 09/11/2021   TSH 0.76 12/25/2020   TSH 0.28 (L) 10/29/2020   TSH 12.27 (H) 09/17/2020   TSH 0.64 09/14/2019   FREET4 1.19 07/26/2023   FREET4 1.13 06/05/2022   FREET4 1.10 12/12/2021   FREET4 0.89 10/28/2021   FREET4 1.17 09/11/2021   FREET4 1.19 12/25/2020   FREET4 1.13 10/29/2020   FREET4 0.77 09/17/2020   FREET4 1.04 09/14/2019   FREET4 0.73 09/08/2018     Pt denies: - feeling nodules in neck - hoarseness - dysphagia - choking  Unknown family history of thyroid disease or thyroid cancer since he was adopted. No h/o radiation tx to head or neck. No herbal supplements. No Biotin use. No recent steroids use.   Clinical osteoporosis:  He was found to have a vertebral fracture after fall from level ground Spring 2022. He fell onto his tailbone and fractured 1 vertebra.  He had kyphoplasty and then had another compression fracture.  His back pain improved since then.    MRI lumbar spine (08/09/2021): 1. Compression fracture of the T11 vertebral body status post vertebral augmentation with persistent marrow edema. 2. Minor compression fracture of the superior endplate of T12 on the right side with persistent marrow edema without significant height loss.  CT chest (04/15/2022): Musculoskeletal: Chronic compression fracture of T11 with 30% loss of anterior vertebral body height. Post vertebroplasty changes are noted at T11 and T12. Several old healed bilateral rib  fractures are again noted. There are no aggressive appearing lytic or blastic lesions noted in the visualized portions of the skeleton.  CT chest (04/09/2023): No acute osseous findings.  Unchanged vertebral cement augmentation of T11 and T12.  CT chest w/o contrast (09/27/2023): 1. Stable left upper lobe solid nodule with adjacent opacities which are likely due to postradiation change. No evidence of recurrent or metastatic disease in the chest. 2. Stable small solid and ground-glass pulmonary nodules. Recommend attention on follow-up. 3.  Aortic Atherosclerosis (ICD10-I70.0).   He had a bone density scan several years ago in [facility] reportedly showing osteopenia.  Reviewed latest bone density scan reports: 10/14/2023 [facility]) Lumbar spine L1-L3 (L4) Femoral neck (FN) 33% Distal radius Ultra distal radius  T-score  1.8 RFN: -1.5 LFN: -1.0 0.4 0.0   09/16/2021 ([place]) Lumbar spine L1-L4 Femoral neck (FN)  T-score  1.2 RFN: -1.0 LFN: -1.2    We started Fosamax 70 mg weekly in 08/2021.  He tolerates this well.  No other fractures as an adult, but had a wrist fracture as a child.  He has a history of vitamin D deficiency, but latest level was normal: Lab Results  Component Value Date   VD25OH 42.01 07/26/2023   VD25OH 49.05 06/05/2022   VD25OH 60.58 09/11/2021   She is on vitamin D 2000 units daily.  Per review the chart, he presented with AMS 3 days after stopping alcohol 05/30/2023-previously drinking 9-10 drinks a day.  A CT of the head at that time showed chronic bilateral basal ganglia and cerebellar infarcts. He quit drinking afterwards.  ROS: + See HPI + tremors  I reviewed pt's medications, allergies, PMH, social hx, family hx, and changes were documented in the history of present illness. Otherwise, unchanged from my initial visit note.  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) 06/05/2023    Altered mental status, unspecified 06/05/2023   Anxiety    Cerebral infarction, unspecified (HCC) 06/05/2023   Cerebral ischemia 06/05/2023   COPD (chronic obstructive pulmonary disease) (HCC)    Depression    Dry eye syndrome    Dyslipidemia 01/05/2023   Dysrhythmia    hx of PAC's   Effusion of elbow joint, right 06/29/2017   Gastric ulcer with perforation but without obstruction (HCC) 04/15/2016   GERD (gastroesophageal reflux disease)    Gout    HTN (hypertension), benign 02/18/2023   Hyperlipidemia    Hypertension    Hypothyroidism    Hypothyroidism, unspecified 06/05/2023   Malignant neoplasm of thyroid gland (HCC) 06/05/2023   Muscle wasting and atrophy, not elsewhere classified, unspecified site 06/07/2023   Muscle wasting and atrophy, not elsewhere classified, unspecified site 06/07/2023   Nodule of left lung 02/18/2023   Non-restorative sleep 02/18/2023   OSA (obstructive sleep apnea) 02/18/2023   Papillary thyroid carcinoma s/p total thyroidectomy & CLND 08/27/2017 08/24/2017   Penetrating ulcer of aorta (HCC) 02/18/2023   Postsurgical hypothyroidism 10/01/2017   Protrusion of intervertebral disc of thoracic region 02/18/2023   Seborrheic dermatitis 02/18/2023   Thyroid cancer (HCC)    2018   Wheezing 06/05/2023   Past Surgical History:  Procedure Laterality Date   ABDOMINAL SURGERY     hemmoraging ulcer   BRONCHIAL BIOPSY  06/25/2020   Procedure: BRONCHIAL BIOPSIES;  Surgeon: [name], MD;  Location: [facility] ENDOSCOPY;  Service: Pulmonary;;   BRONCHIAL BRUSHINGS  06/25/2020   Procedure: BRONCHIAL BRUSHINGS;  Surgeon: [name], MD;  Location: [facility] ENDOSCOPY;  Service: Pulmonary;;   BRONCHIAL NEEDLE ASPIRATION BIOPSY  06/25/2020   Procedure: BRONCHIAL NEEDLE ASPIRATION BIOPSIES;  Surgeon: [name], MD;  Location: [facility] ENDOSCOPY;  Service: Pulmonary;;   BRONCHIAL WASHINGS  06/25/2020   Procedure: BRONCHIAL WASHINGS;  Surgeon: [name], MD;  Location:  [facility] ENDOSCOPY;  Service: Pulmonary;;   CATARACT EXTRACTION Right    COLONOSCOPY     EYE SURGERY Right    x 4  between ages 8-13   FIDUCIAL MARKER PLACEMENT  06/25/2020   Procedure: FIDUCIAL MARKER PLACEMENT;  Surgeon: [name], MD;  Location: [facility] ENDOSCOPY;  Service: Pulmonary;;   THYROIDECTOMY N/A 08/27/2017   Procedure: TOTAL THYROIDECTOMY WITH LIMITED LYMPH NODE DISSECTION;  Surgeon: [name], MD;  Location: [facility] ORS;  Service: General;  Laterality: N/A;   UPPER GI ENDOSCOPY     VIDEO BRONCHOSCOPY WITH ENDOBRONCHIAL NAVIGATION N/A 06/25/2020   Procedure: VIDEO BRONCHOSCOPY WITH ENDOBRONCHIAL NAVIGATION;  Surgeon: [name], MD;  Location: [facility] ENDOSCOPY;  Service: Pulmonary;  Laterality: N/A;   Social History   Social History   Marital status: Single    Spouse name: N/A   Number of children: 0   Occupational History   n/a   Social History Main Topics   Smoking status: Current Every Day Smoker    Packs/day: 1.00    Years: 40.00    Types: Cigarettes   Smokeless tobacco: Never Used     Comment: 1 1/2 per day   Alcohol use 0.6 oz/week    3-4 Cans of beer per day   Drug use: No   Current Outpatient Medications on File Prior to Visit  Medication Sig Dispense Refill   albuterol (VENTOLIN HFA) 108 (90 Base) MCG/ACT inhaler Inhale 2 puffs into the lungs every 6 (six) hours as needed for wheezing or shortness of breath. (Patient not taking: Reported on 01/12/2024)     alendronate (FOSAMAX) 70 MG tablet TAKE 1 TABLET EVERY 7 DAYS WITH A FULL GLASS OF WATER ON AN EMPTY STOMACH. 12 tablet 3   aspirin EC 81 MG tablet Take 1 tablet (81 mg total) by mouth daily. Swallow whole.     atenolol (TENORMIN) 100 MG tablet Take 100 mg by mouth 2 (two) times daily.     CALCIUM PO Take 1 tablet by mouth daily.     Cholecalciferol (VITAMIN D) 2000 units tablet Take 2,000 Units by mouth daily.     clonazePAM (KLONOPIN) 0.5 MG tablet Take 1 tablet (0.5 mg total) by mouth at bedtime. 30 tablet 0    desvenlafaxine (PRISTIQ) 50 MG 24 hr tablet Take 50 mg by mouth daily.     fenofibrate 160 MG tablet Take 160 mg by mouth daily with supper.      hydrocortisone 2.5 % cream Apply 1 application  topically 2 (two) times daily as needed (rash).     levothyroxine (SYNTHROID) 125 MCG tablet TAKE 1 TABLET EVERY DAY 90 tablet 3   Multiple Vitamin (MULTIVITAMIN WITH MINERALS) TABS tablet Take 1 tablet by mouth daily.     omeprazole (PRILOSEC) 20 MG capsule Take 20 mg by mouth daily before breakfast.      rosuvastatin (CRESTOR) 10 MG tablet Take 10 mg by mouth daily.      zolpidem (AMBIEN) 5 MG tablet Take 5 mg by mouth at bedtime as needed  for sleep.     No current facility-administered medications on file prior to visit.   Allergies  Allergen Reactions   Lorazepam Anxiety and Hypertension   Family History  Adopted: Yes   PE: BP 120/70   Pulse (!) 58   Ht 6' (1.829 m)   Wt 234 lb 3.2 oz (106.2 kg)   SpO2 96%   BMI 31.76 kg/m  Wt Readings from Last 3 Encounters:  01/26/24 234 lb 3.2 oz (106.2 kg)  01/12/24 230 lb (104.3 kg)  12/17/23 228 lb 6.4 oz (103.6 kg)   Constitutional: overweight, in NAD Eyes: EOMI, no exophthalmos ENT:no neck masses palpated, no cervical lymphadenopathy Cardiovascular: RRR, No MRG Respiratory: CTA B Musculoskeletal: no deformities Skin: no rashes Neurological: + tremor with outstretched hands  ASSESSMENT: 1. Thyroid cancer - see HPI  2. Postsurgical Hypothyroidism  3. Vertebral fracture  PLAN:  1. Papillary Thyroid Cancer -Patient with history of classic type papillary thyroid cancer, not very large, encapsulated, without extrathyroidal extension, without lymphovascular invasion and negative lymph node biopsies.  Therefore, his cancer was considered low risk and we decided to continue without RAI treatment per the 2015 ATA thyroid cancer guidelines.  We are following him with thyroglobulin tumor markers and also with neck ultrasounds. We did  discuss that even if he ends up needing RAI treatment as a salvage therapy in the future, his prognosis would likely not be changed. -Latest neck ultrasound did not show any metastases or recurrences in his neck.  Also PET scan and CT scans obtained during lung cancer staging did not show any thyroid cancer recurrence or metastasis.  Latest CT scan is from 08/2023.  This was reviewed today. -Last check, thyroglobulin level was stable, at 0.2.  This is not undetectable, as expected in the absence of RAI treatment.  ATA antibodies are undetectable. -Plan to recheck his thyroglobulin and thyroglobulin antibodies today and then at next visit -I will see him back in 1 year  2. Patient with history of total thyroidectomy for thyroid cancer now with iatrogenic hypothyroidism on levothyroxine therapy - latest thyroid labs reviewed with pt. >> normal: Lab Results  Component Value Date   TSH 1.87 07/26/2023  - he continues on LT4 125 mcg daily - pt feels good on this dose. - we discussed about taking the thyroid hormone every day, with water, >30 minutes before breakfast, separated by >4 hours from acid reflux medications, calcium, iron, multivitamins. Pt. is taking it correctly. - will check thyroid tests today: TSH and fT4 - If labs are abnormal, he will need to return for repeat TFTs in 1.5 months  3.  Clinical osteoporosis -No falls or fractures since last visit -In 2022, he had 2 vertebral fractures as a consequence of a fall from level ground, qualifying as fragility fractures.  This puts him into the osteoporotic category.  He had kyphoplasty and back pain improved afterwards. -He continues on vitamin D 5000 units daily with the last vitamin D level being normal in 07/2023, at 82 -We checked a bone density scan and this showed osteopenia, however, due to the presence of fragility fractures, he qualified for clinical osteoporosis.  We repeated a bone density after last visit but this was checked  unfortunately on another machine so the T-scores are not directly comparable.  Overall, T-scores appear to be fairly stable at the left femoral neck, and may be worse in the right femoral neck, but again, not directly comparable with the previous report.  Going forward,  we discussed about continuing using the same machine -He has a history of radiation therapy, and therefore a contraindication to [employer] and [employer].  We ended up starting Fosamax 70 mg weekly in 2022.  He tolerates this well, without jaw/hip/thigh pain.  He takes it correctly, 30 minutes before levothyroxine, with a full glass of water. -Plan to continue Fosamax for 5 years but repeat the bone density scan 2 years from the previous  Orders Placed This Encounter  Procedures   TSH   VITAMIN D 25 Hydroxy (Vit-D Deficiency, Fractures)   Thyroglobulin Level   Thyroglobulin antibody   Component     Latest Ref Rng 01/26/2024  TSH     0.40 - 4.50 mIU/L 7.49 (H)   Thyroglobulin Ab     < or = 1 IU/mL <1   Thyroglobulin     ng/mL 0.4 (L)   Comment --   Vitamin D, 25-Hydroxy     30 - 100 ng/mL 35   Vitamin D is normal.  TSH is elevated, which could explain the increase in thyroglobulin level. ATA are undetectable. Will increase the dose of levothyroxine to 137 mcg daily and I plan to recheck his TFTs and Tg  in 1.5 mo.  Carlus Pavlov, MD PhD [facility] Endocrinology

## 2024-01-26 NOTE — Patient Instructions (Addendum)
 Please continue Synthroid 125 mcg daily.  Take the thyroid hormone every day, with water, at least 30 minutes before breakfast, separated by at least 4 hours from: - acid reflux medications - calcium - iron - multivitamins  Please stop at the lab.  Continue vitamin D 2000 units daily.  Continue Fosamax 70 mg weekly.  Please come back for a follow-up appointment in 1 year.

## 2024-01-28 ENCOUNTER — Encounter: Payer: Self-pay | Admitting: Internal Medicine

## 2024-01-28 ENCOUNTER — Other Ambulatory Visit: Payer: Self-pay | Admitting: Internal Medicine

## 2024-01-28 DIAGNOSIS — E890 Postprocedural hypothyroidism: Secondary | ICD-10-CM

## 2024-01-28 DIAGNOSIS — C73 Malignant neoplasm of thyroid gland: Secondary | ICD-10-CM

## 2024-01-28 LAB — THYROGLOBULIN LEVEL: Thyroglobulin: 0.4 ng/mL — ABNORMAL LOW

## 2024-01-28 LAB — TSH: TSH: 7.49 m[IU]/L — ABNORMAL HIGH (ref 0.40–4.50)

## 2024-01-28 LAB — VITAMIN D 25 HYDROXY (VIT D DEFICIENCY, FRACTURES): Vit D, 25-Hydroxy: 35 ng/mL (ref 30–100)

## 2024-01-28 LAB — THYROGLOBULIN ANTIBODY: Thyroglobulin Ab: <1 [IU]/mL (ref <=1)

## 2024-01-28 MED ORDER — LEVOTHYROXINE SODIUM 137 MCG PO TABS: 137.0000 ug | ORAL_TABLET | Freq: Every day | ORAL | 3 refills | Status: DC

## 2024-01-28 NOTE — Telephone Encounter (Signed)
 Please advise, currently all labs are not final

## 2024-01-30 ENCOUNTER — Encounter: Payer: Self-pay | Admitting: Cardiovascular Disease

## 2024-01-31 ENCOUNTER — Encounter: Payer: Self-pay | Admitting: Internal Medicine

## 2024-01-31 NOTE — Addendum Note (Signed)
 Addended by: Carlus Pavlov on: 01/31/2024 12:12 PM   Modules accepted: Orders

## 2024-02-07 MED ORDER — CLONAZEPAM 0.5 MG PO TABS: 0.7500 mg | ORAL_TABLET | Freq: Every day | ORAL | 0 refills | Status: DC

## 2024-02-07 NOTE — Telephone Encounter (Signed)
 Pt has been notified and voices understanding.

## 2024-02-07 NOTE — Addendum Note (Signed)
 Addended by: Ihor Austin L on: 02/07/2024 12:02 PM   Modules accepted: Orders

## 2024-02-15 NOTE — Progress Notes (Signed)
 [facility] Summary Report / Loop Recorder

## 2024-02-15 NOTE — Addendum Note (Signed)
 Addended by: Elease Etienne A on: 02/15/2024 09:42 AM   Modules accepted: Orders

## 2024-02-17 ENCOUNTER — Ambulatory Visit
Admission: RE | Admit: 2024-02-17 | Discharge: 2024-02-17 | Disposition: A | Discharge status: Home / Self Care | Payer: Medicare HMO | Source: Ambulatory Visit | Attending: Adult Health | Admitting: Adult Health

## 2024-02-17 DIAGNOSIS — I6522 Occlusion and stenosis of left carotid artery: Secondary | ICD-10-CM | POA: Diagnosis not present

## 2024-02-17 DIAGNOSIS — I6501 Occlusion and stenosis of right vertebral artery: Secondary | ICD-10-CM

## 2024-02-17 DIAGNOSIS — I6523 Occlusion and stenosis of bilateral carotid arteries: Secondary | ICD-10-CM

## 2024-02-17 DIAGNOSIS — I639 Cerebral infarction, unspecified: Secondary | ICD-10-CM

## 2024-02-17 MED ORDER — IOPAMIDOL (ISOVUE-370) INJECTION 76%
75.0000 mL | Freq: Once | INTRAVENOUS | Status: AC | PRN
  Administered 2024-02-17: 75 mL via INTRAVENOUS

## 2024-02-18 ENCOUNTER — Ambulatory Visit: Payer: Medicare HMO

## 2024-02-18 DIAGNOSIS — I639 Cerebral infarction, unspecified: Secondary | ICD-10-CM

## 2024-02-18 LAB — CUP PACEART REMOTE DEVICE CHECK
Date Time Interrogation Session: 20250320231310
Implantable Pulse Generator Implant Date: 20240514

## 2024-02-23 ENCOUNTER — Encounter: Payer: Self-pay | Admitting: Cardiovascular Disease

## 2024-03-01 ENCOUNTER — Encounter: Payer: Self-pay | Admitting: Neurology

## 2024-03-01 ENCOUNTER — Ambulatory Visit (INDEPENDENT_AMBULATORY_CARE_PROVIDER_SITE_OTHER): Payer: Medicare HMO | Admitting: Neurology

## 2024-03-01 VITALS — BP 120/65 | HR 57 | Ht 72.0 in | Wt 245.0 lb

## 2024-03-01 DIAGNOSIS — R259 Unspecified abnormal involuntary movements: Secondary | ICD-10-CM

## 2024-03-01 DIAGNOSIS — G255 Other chorea: Secondary | ICD-10-CM

## 2024-03-01 DIAGNOSIS — G244 Idiopathic orofacial dystonia: Secondary | ICD-10-CM

## 2024-03-01 MED ORDER — CLONAZEPAM 0.5 MG PO TABS: 1.0000 mg | ORAL_TABLET | Freq: Every day | ORAL | 0 refills | Status: DC

## 2024-03-01 MED ORDER — VALBENAZINE TOSYLATE 40 MG PO CAPS
40.0000 mg | ORAL_CAPSULE | Freq: Every day | ORAL | 1 refills | Status: DC
Start: 2024-03-01 — End: 2024-04-28

## 2024-03-01 NOTE — Patient Instructions (Signed)
 I had a long discussion with the patient with regards to his subacute involuntary movements affecting his face as well as now the hands and feet which may represent dyskinesias chorea.  I recommend further evaluation with checking lab work for Huntington's disease and trial of Ingrezza 40 mg daily if approved by insurance.  Increase Klonopin to 1 mg at night.  Continue treatment for anxiety as per primary care physician and keep his scheduled appointment to see psychiatry for more optimal management of his anxiety.  Continue aspirin for stroke prevention and maintain aggressive risk factor modification with strict control of hypertension blood pressure goal below 140/90, lipids with LDL cholesterol goal below 70 mg percent and diabetes with hemoglobin A1c goal below 6.5%.  Return for follow-up in 3 months with nurse practitioner Shanda Bumps or call earlier if necessary.

## 2024-03-01 NOTE — Progress Notes (Signed)
 [facility] [address] [place] [address] [phone]       OFFICE FOLLOW UP VISIT NOTE  Timothy Conway Date of Birth:  [birth year] Medical Record Number:  [id number]   Referring MD: Dibas Koirala  Reason for Referral: Stroke  ION:[id number] Visit 02/18/2023 : [name] is a 68 year old Caucasian male seen today for initial office consultation visit for stroke.  History is obtained from the patient and review of collateral notes and electronic medical records.  I have personally reviewed pertinent available imaging films in PACS.  He has past medical history of hypertension, hyperlipidemia, obstructive sleep apnea, depression, COPD, lung adenocarcinoma, gastroesophageal reflux disease, gout, thyroid cancer.  Patient states for the last 6 months or so his benign send gait and balance difficulties.  He states this began suddenly 1 day when he woke up.  Trouble getting out of bed and he fell down.  He was told that he was walking like a drunk.  He did not seek medical help at that time.  The last few months he feels his gait and balance are improving.  He underwent MRI scan of the brain on 11/07/2022 which showed a 1 cm right cerebellar cortical and white matter acute infarct as well as remote age bilateral basal ganglia and corona radiata lacunar infarcts.  There is mild changes of small vessel disease.  Patient started on aspirin for stroke prevention since which is tolerating well without bruising or bleeding.  He is also on Crestor tolerating well.  His blood pressure is well-controlled today it is 130/67.  He states that he never been diagnosed with sleep apnea though I find the diagnosis listed past medical history.  He has had no further strokelike symptoms.  Denies any history of syncopal events, palpitations or atrial fibrillation.  He does admit to mild short-term memory and cognitive difficulties.  He lives alone and manages a normal.  Echocardiogram on 11/2522  showed ejection fraction of 65%.  There is mild left atrial dilatation.  Carotid ultrasound showed no significant extracranial stenosis bilaterally.  4 weeks cardiac external monitor showed no evidence of atrial fibrillation with brief runs of supraventricular tachycardia and isolated supraventricular ectopics. Update 03/01/2024 : He returns for follow-up after last visit with me a year ago.  Patient has noticed involuntary movements involving his face for the last 3 to 4 months and they seem to have now spread to involve both hands and feet.  These are constant.  He feels like he is fidgeting his hands and feet.  Feels restless.  He was seen by Shanda Bumps nurse practitioner at last visit and started on Klonopin which she feels has helped somewhat but not enough.  He has been having difficult time sleeping as well.  He tried Ambien did not work.  He tried trazodone as well which has not helped.  He has history of longstanding anxiety and was on Xanax for years but it was discontinued.  He also had a history of heavy alcohol intake about 9 beers per day for 25 years and stopped that as well last year.  His involuntary movements actually began after stopping his alcohol and Xanax.  He has been started on Pristiq 50 mg daily by his PCP for anxiety and feels it is helping.  He has also been referred to psychiatry but has not heard about appointment from them yet.  He denies any family history of Huntington's disease.  He denies any significant memory loss or cognitive impairment.  He had CT angiogram of the brain and neck done on 02/17/2024 but the official report is not yet back and I have looked at it personally I did not see any acute abnormality.  There may be some moderate narrowing at the origin of the right vertebral artery but all intracranial vessels and carotid bifurcations appear patent.  He has not had any recurrent stroke or TIA symptoms.  Remains on aspirin she is tolerating well without bruising or bleeding.   States his blood pressure is under good control. Update 01/12/2024 JM: Patient returns for 62-month follow-up visit.  His main concern today is in regards to involuntary movements that started about 4 to 5 months ago, initially present in his jaw and gradually started affecting his legs and now affecting his arms.  He has had difficulty eating due to jaw movement and pain as well as affecting his ability to speak, leg movements can become painful especially at night with a severe cramping sensation.  Reports jaw movements disappear during sleep.  Involuntary movements are present all day long but usually overall mild during the day.  Denies any weakness or changes in his gait, does have chronic gait impairment and imbalance after his stroke in 10/2022 but this has been stable.  He was having slight upper extremity tremor initially but this seems to have subsided.  Unsure family history of similar symptoms as he was adopted.  He was started on Pristiq about 3 months ago which has helped reduce panic attacks but continues to struggle with depression/anxiety.  He also continues to struggle with insomnia, use of Ambien with some benefit. He was previously on Xanax 6mg  daily for multiple years but this was stopped back in 05/2023 after ED visit for altered mental status.  He also reports complete EtOH cessation since that time.  He was previously followed by psychiatry but stopped after Xanax was discontinued.  Denies starting any new medications around the time of onset.  Overall stable from stroke standpoint.  Denies any current memory concerns, feels brain fog has improved.  Remains on aspirin and Crestor.  Routinely follows with PCP and cardiology for stroke risk factor management.  Loop recorder has not shown atrial fibrillation thus far.  Previously discussed follow-up with [facility] for untreated sleep apnea but feels he no longer has issues with apnea since he stopped EtOH use.  ROS:   14 system review of  systems is positive for dizziness, imbalance, fall, memory difficulties and all other systems negative  PMH:  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) 06/05/2023   Altered mental status, unspecified 06/05/2023   Anxiety    Cerebral infarction, unspecified (HCC) 06/05/2023   Cerebral ischemia 06/05/2023   COPD (chronic obstructive pulmonary disease) (HCC)    Depression    Dry eye syndrome    Dyslipidemia 01/05/2023   Dysrhythmia    hx of PAC's   Effusion of elbow joint, right 06/29/2017   Gastric ulcer with perforation but without obstruction (HCC) 04/15/2016   GERD (gastroesophageal reflux disease)    Gout    HTN (hypertension), benign 02/18/2023   Hyperlipidemia    Hypertension    Hypothyroidism    Hypothyroidism, unspecified 06/05/2023   Malignant neoplasm of thyroid gland (HCC) 06/05/2023   Muscle wasting and atrophy, not elsewhere classified, unspecified site 06/07/2023  Muscle wasting and atrophy, not elsewhere classified, unspecified site 06/07/2023   Nodule of left lung 02/18/2023   Non-restorative sleep 02/18/2023   OSA (obstructive sleep apnea) 02/18/2023   Papillary thyroid carcinoma s/p total thyroidectomy & CLND 08/27/2017 08/24/2017   Penetrating ulcer of aorta (HCC) 02/18/2023   Postsurgical hypothyroidism 10/01/2017   Protrusion of intervertebral disc of thoracic region 02/18/2023   Seborrheic dermatitis 02/18/2023   Thyroid cancer (HCC)    2018   Wheezing 06/05/2023    Social History:  Social History   Socioeconomic History   Marital status: Single    Spouse name: Not on file   Number of children: Not on file   Years of education: Not on file   Highest education level: Not on file  Occupational History   Not  on file  Tobacco Use   Smoking status: Former    Current packs/day: 0.00    Average packs/day: 1 pack/day for 40.0 years (40.0 ttl pk-yrs)    Types: Cigarettes    Start date: 05/1979    Quit date: 05/2019    Years since quitting: 4.8   Smokeless tobacco: Never  Vaping Use   Vaping status: Never Used  Substance and Sexual Activity   Alcohol use: Not Currently    Alcohol/week: 0.0 standard drinks of alcohol    Comment: Used to drink a lot -moderate - quit 2020   Drug use: Not Currently    Types: Marijuana    Comment: last use years ago   Sexual activity: Not Currently  Other Topics Concern   Not on file  Social History Narrative   Not on file   Social Drivers of Health   Financial Resource Strain: Not on file  Food Insecurity: Not on file  Transportation Needs: Not on file  Physical Activity: Not on file  Stress: Not on file  Social Connections: Unknown (03/30/2022)   Received from [facility], [facility]   Social Network    Social Network: Not on file  Intimate Partner Violence: Unknown (03/06/2022)   [facility], [facility]   HITS    Physically Hurt: Not on file    Insult or Talk Down To: Not on file    Threaten Physical Harm: Not on file    Scream or Curse: Not on file    Medications:   Current Outpatient Medications on File Prior to Visit  Medication Sig Dispense Refill   alendronate (FOSAMAX) 70 MG tablet TAKE 1 TABLET EVERY 7 DAYS WITH A FULL GLASS OF WATER ON AN EMPTY STOMACH. 12 tablet 3   aspirin EC 81 MG tablet Take 1 tablet (81 mg total) by mouth daily. Swallow whole.     atenolol (TENORMIN) 100 MG tablet Take 100 mg by mouth 2 (two) times daily.     CALCIUM PO Take 1 tablet by mouth daily.     Cholecalciferol (VITAMIN D) 2000 units tablet Take 2,000 Units by mouth daily.     desvenlafaxine (PRISTIQ) 50 MG 24 hr tablet Take 50 mg by mouth daily.     fenofibrate 160 MG tablet Take 160 mg by mouth daily with supper.      hydrocortisone  2.5 % cream Apply 1 application  topically 2 (two) times daily as needed (rash).     levothyroxine (SYNTHROID) 137 MCG tablet Take 1 tablet (137 mcg total) by mouth daily. 45 tablet 3   Multiple Vitamin (MULTIVITAMIN WITH MINERALS) TABS tablet Take 1 tablet by mouth daily.  omeprazole (PRILOSEC) 20 MG capsule Take 20 mg by mouth daily before breakfast.      rosuvastatin (CRESTOR) 10 MG tablet Take 10 mg by mouth daily.      albuterol (VENTOLIN HFA) 108 (90 Base) MCG/ACT inhaler Inhale 2 puffs into the lungs every 6 (six) hours as needed for wheezing or shortness of breath. (Patient not taking: Reported on 03/01/2024)     No current facility-administered medications on file prior to visit.    Allergies:   Allergies  Allergen Reactions   Lorazepam Anxiety and Hypertension    Physical Exam General: Obese unkempt middle-aged Caucasian male, seated, in no evident distress Head: head normocephalic and atraumatic.   Neck: supple with no carotid or supraclavicular bruits Cardiovascular: regular rate and rhythm, no murmurs Musculoskeletal: no deformity Skin:  no rash/petichiae Vascular:  Normal pulses all extremities  Neurologic Exam Mental Status: Awake and fully alert. Oriented to place and time. Recent memory mildly impaired and remote memory intact. Attention span, concentration and fund of knowledge mostly appropriate. Mood and affect appropriate.  Recall 3/3.  Able to name 13 animals which can walk on 4 legs.  Clock drawing 4/4. 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  Extraocular movements full without nystagmus. Visual fields full to confrontation in the left eye.Marland Kitchen Hearing intact. Facial sensation intact. Face, tongue, palate moves normally and symmetrically.  Motor: Normal bulk and tone. Normal strength in all tested extremity muscles.  Consistent involuntary mild movements of jaw, head and bilateral lower extremities.  Hand movements  appear to be quasi purposeful.  No evidence of resting or action tremor.  No cogwheel rigidity. Sensory.: intact to touch , pinprick , position and vibratory sensation.  Coordination: Rapid alternating movements normal in all extremities. Finger-to-nose and heel-to-shin performed accurately bilaterally.   Gait and Station: Arises from chair without difficulty. Stance is normal. Gait is slightly broad-based demonstrates with moderate unsteadiness, adequate stride length although decreased appetite.  Greater difficulty with turns. No use of AD.  Tandem walk and heel toe not attempted. Reflexes: 1+ and symmetric. Toes downgoing.    ASSESSMENT: 68 year old Caucasian male with subacute balance and gait difficulties for the last 6 months with abnormal MRI scan showing a subacute right cerebellar infarct in December 2023 as well as bilateral subcortical lacunar of remote age.  Vascular risk factors of obesity, hyperlipidemia, hypertension, at risk for obstructive sleep apnea and silent cerebral infarcts.  He also has mild cognitive impairment which appears stable.  New involuntary orofacial dyskinesia he is here as well as involuntary movements now affecting hands and feet raising concern for Huntington's.     PLAN:I had a long discussion with the patient with regards to his subacute involuntary movements affecting his face as well as now the hands and feet which may represent dyskinesias chorea.  I recommend further evaluation with checking lab work for Huntington's disease and trial of Ingrezza 40 mg daily if approved by insurance.  Increase Klonopin to 1 mg at night.  Continue treatment for anxiety as per primary care physician and keep his scheduled appointment to see psychiatry for more optimal management of his anxiety.  Continue aspirin for stroke prevention and maintain aggressive risk factor modification with strict control of hypertension blood pressure goal below 140/90, lipids with LDL cholesterol  goal below 70 mg percent and diabetes with hemoglobin A1c goal below 6.5%.  Return for follow-up in 3 months with nurse practitioner Shanda Bumps or call earlier if necessary.  Greater than 50% time during this 40 minute visit was spent on  counseling and coordination of care about a cerebellar stroke, silent lacunar strokes and involuntary movements and dyskinesias and answering questions  Delia Heady, MD Note: This document was prepared with digital dictation and possible smart phrase technology. Any transcriptional errors that result from this process are unintentional.

## 2024-03-08 ENCOUNTER — Other Ambulatory Visit (HOSPITAL_COMMUNITY): Payer: Self-pay

## 2024-03-08 ENCOUNTER — Telehealth: Payer: Self-pay

## 2024-03-08 NOTE — Telephone Encounter (Signed)
 I came across this PA in [facility] that someone started but did not finish the clinical questions--The insurance is asking for the following-  Pharmacy Patient Advocate Encounter   Received notification from CoverMyMeds that prior authorization for [facility] 40MG  capsulesis required/requested.   Insurance verification completed.   The patient is insured through [place] .   Per test claim:  Austedo tablet and Austedo XR tablet extended release is preferred by the insurance.  If suggested medication is appropriate, Please send in a new RX and discontinue this one. If not, please advise as to why it's not appropriate so that we may request a Prior Authorization. Please note, some preferred medications may still require a PA.  If the suggested medications have not been trialed and there are no contraindications to their use, the PA will not be submitted, as it will not be approved.

## 2024-03-08 NOTE — Telephone Encounter (Signed)
 I called today to ask [facility] to get the CTA scans read.

## 2024-03-09 ENCOUNTER — Encounter: Payer: Self-pay | Admitting: Adult Health

## 2024-03-13 MED ORDER — AUSTEDO 6 MG PO TABS: 6.0000 mg | ORAL_TABLET | Freq: Two times a day (BID) | ORAL | 5 refills | Status: DC

## 2024-03-13 NOTE — Telephone Encounter (Signed)
 Script changed to Austedo 6 mg BID as requested by Dr Janett Medin since preferred covered alternative.

## 2024-03-14 ENCOUNTER — Other Ambulatory Visit

## 2024-03-14 DIAGNOSIS — E890 Postprocedural hypothyroidism: Secondary | ICD-10-CM | POA: Diagnosis not present

## 2024-03-14 DIAGNOSIS — C73 Malignant neoplasm of thyroid gland: Secondary | ICD-10-CM | POA: Diagnosis not present

## 2024-03-14 LAB — HUNTINGTON DISEASE REPEAT EXP

## 2024-03-15 ENCOUNTER — Encounter: Payer: Self-pay | Admitting: Internal Medicine

## 2024-03-15 LAB — T4, FREE: Free T4: 1.5 ng/dL (ref 0.8–1.8)

## 2024-03-15 LAB — TSH: TSH: 1.11 m[IU]/L (ref 0.40–4.50)

## 2024-03-15 LAB — THYROGLOBULIN LEVEL: Thyroglobulin: 0.2 ng/mL — ABNORMAL LOW

## 2024-03-24 ENCOUNTER — Ambulatory Visit (INDEPENDENT_AMBULATORY_CARE_PROVIDER_SITE_OTHER): Payer: Medicare HMO

## 2024-03-24 DIAGNOSIS — I639 Cerebral infarction, unspecified: Secondary | ICD-10-CM

## 2024-03-24 LAB — CUP PACEART REMOTE DEVICE CHECK
Date Time Interrogation Session: 20250424231307
Implantable Pulse Generator Implant Date: 20240514

## 2024-03-28 NOTE — Progress Notes (Signed)
 [facility] Summary Report / Loop Recorder

## 2024-03-30 ENCOUNTER — Encounter: Payer: Self-pay | Admitting: Neurology

## 2024-03-31 ENCOUNTER — Encounter: Payer: Self-pay | Admitting: Cardiovascular Disease

## 2024-03-31 ENCOUNTER — Other Ambulatory Visit: Payer: Self-pay

## 2024-03-31 DIAGNOSIS — C73 Malignant neoplasm of thyroid gland: Secondary | ICD-10-CM

## 2024-03-31 MED ORDER — LEVOTHYROXINE SODIUM 137 MCG PO TABS: 137.0000 ug | ORAL_TABLET | Freq: Every day | ORAL | 3 refills | Status: DC

## 2024-04-04 ENCOUNTER — Other Ambulatory Visit: Payer: Self-pay

## 2024-04-04 MED ORDER — CLONAZEPAM 0.5 MG PO TABS: 1.0000 mg | ORAL_TABLET | Freq: Every day | ORAL | 0 refills | Status: DC

## 2024-04-04 NOTE — Telephone Encounter (Signed)
 Requested Prescriptions   Pending Prescriptions Disp Refills   clonazePAM  (KLONOPIN ) 0.5 MG tablet 45 tablet 0    Sig: Take 2 tablets (1 mg total) by mouth at bedtime.   Last seen 03/01/24 w/sethi Next appt 04/11/24, and 06/29/24  Dispenses   Dispensed Days Supply Quantity Provider Pharmacy  CLONAZEPAM  0.5MG  TABLETS 03/01/2024 22 45 each Lisabeth Rider, MD [facility] #...  CLONAZEPAM  0.5MG  TABLETS 02/07/2024 30 45 each Johny Nap, NP [facility] #...  CLONAZEPAM  0.5MG  TABLETS 01/17/2024 30 30 each Johny Nap, NP [facility] #...    Last note from Dr. Janett Medin stated: Increase Klonopin  to 1 mg at night.  Routing to work in provider to fill Pt taking 2 0.5mg  tabs qhs

## 2024-04-06 ENCOUNTER — Telehealth: Payer: Self-pay | Admitting: Neurology

## 2024-04-06 NOTE — Telephone Encounter (Signed)
 Appointment details confirmed

## 2024-04-11 ENCOUNTER — Encounter: Payer: Self-pay | Admitting: Neurology

## 2024-04-11 ENCOUNTER — Ambulatory Visit (INDEPENDENT_AMBULATORY_CARE_PROVIDER_SITE_OTHER): Payer: Medicare HMO | Admitting: Neurology

## 2024-04-11 VITALS — BP 149/70 | HR 57 | Ht 73.0 in | Wt 256.0 lb

## 2024-04-11 DIAGNOSIS — F10251 Alcohol dependence with alcohol-induced psychotic disorder with hallucinations: Secondary | ICD-10-CM | POA: Diagnosis not present

## 2024-04-11 DIAGNOSIS — J449 Chronic obstructive pulmonary disease, unspecified: Secondary | ICD-10-CM | POA: Diagnosis not present

## 2024-04-11 DIAGNOSIS — C3492 Malignant neoplasm of unspecified part of left bronchus or lung: Secondary | ICD-10-CM

## 2024-04-11 DIAGNOSIS — K7460 Unspecified cirrhosis of liver: Secondary | ICD-10-CM | POA: Diagnosis not present

## 2024-04-11 MED ORDER — AUSTEDO 6 MG PO TABS: 12.0000 mg | ORAL_TABLET | Freq: Two times a day (BID) | ORAL | 5 refills | Status: DC

## 2024-04-11 NOTE — Progress Notes (Signed)
 Timothy Conway

## 2024-04-11 NOTE — Patient Instructions (Signed)
 I had a long discussion with the patient with regards to his subacute involuntary movements affecting his face as well as now the hands and feet which may represent dyskinesias  or chorea.  I recommend increase the dose of Austedo  to 12 mg twice daily as it is tolerating it well without side effects but has not obtained any benefit.  Taper Klonopin  to 1 tablet at night for 2 weeks and discontinue as he is having tiredness which may be related and he feels it is not helping.  Continue management of thyroid  dysfunction as per his endocrinologist..  Continue treatment for anxiety as per primary care physician and keep his scheduled appointment to see psychiatry for more optimal management of his anxiety.  Continue aspirin  for stroke prevention and maintain aggressive risk factor modification with strict control of hypertension blood pressure goal below 140/90, lipids with LDL cholesterol goal below 70 mg percent and diabetes with hemoglobin A1c goal below 6.5%.  Return for follow-up in 3 months with nurse practitioner Camilo Cella or call earlier if necessary.

## 2024-04-11 NOTE — Progress Notes (Signed)
 [facility] [address] [place]. [place] [address] [phone]       OFFICE FOLLOW UP VISIT NOTE  [name] Date of Birth:  05-Jan-1956 Medical Record Number:  [id number]   Referring MD: Dibas Koirala  Reason for Referral: Stroke  ACZ:[id number] Visit 02/18/2023 : [name] is a 68 year old Caucasian male seen today for initial office consultation visit for stroke.  History is obtained from the patient and review of collateral notes and electronic medical records.  I have personally reviewed pertinent available imaging films in PACS.  He has past medical history of hypertension, hyperlipidemia, obstructive sleep apnea, depression, COPD, lung adenocarcinoma, gastroesophageal reflux disease, gout, thyroid  cancer.  Patient states for the last 6 months or so his benign send gait and balance difficulties.  He states this began suddenly 1 day when he woke up.  Trouble getting out of bed and he fell down.  He was told that he was walking like a drunk.  He did not seek medical help at that time.  The last few months he feels his gait and balance are improving.  He underwent MRI scan of the brain on 11/07/2022 which showed a 1 cm right cerebellar cortical and white matter acute infarct as well as remote age bilateral basal ganglia and corona radiata lacunar infarcts.  There is mild changes of small vessel disease.  Patient started on aspirin  for stroke prevention since which is tolerating well without bruising or bleeding.  He is also on Crestor tolerating well.  His blood pressure is well-controlled today it is 130/67.  He states that he never been diagnosed with sleep apnea though I find the diagnosis listed past medical history.  He has had no further strokelike symptoms.  Denies any history of syncopal events, palpitations or atrial fibrillation.  He does admit to mild short-term memory and cognitive difficulties.  He lives alone and manages a normal.  Echocardiogram on 11/2522  showed ejection fraction of 65%.  There is mild left atrial dilatation.  Carotid ultrasound showed no significant extracranial stenosis bilaterally.  4 weeks cardiac external monitor showed no evidence of atrial fibrillation with brief runs of supraventricular tachycardia and isolated supraventricular ectopics. Update 03/01/2024 : He returns for follow-up after last visit with me a year ago.  Patient has noticed involuntary movements involving his face for the last 3 to 4 months and they seem to have now spread to involve both hands and feet.  These are constant.  He feels like he is fidgeting his hands and feet.  Feels restless.  He was seen by Camilo Cella nurse practitioner at last visit and started on Klonopin  which she feels has helped somewhat but not enough.  He has been having difficult time sleeping as well.  He tried Ambien did not work.  He tried trazodone as well which has not helped.  He has history of longstanding anxiety and was on Xanax  for years but it was discontinued.  He also had a history of heavy alcohol intake about 9 beers per day for 25 years and stopped that as well last year.  His involuntary movements actually began after stopping his alcohol and Xanax .  He has been started on Pristiq 50 mg daily by his PCP for anxiety and feels it is helping.  He has also been referred to psychiatry but has not heard about appointment from them yet.  He denies any family history of Huntington's disease.  He denies any significant memory loss or cognitive impairment.  He had CT angiogram of the brain and neck done on 02/17/2024 but the official report is not yet back and I have looked at it personally I did not see any acute abnormality.  There may be some moderate narrowing at the origin of the right vertebral artery but all intracranial vessels and carotid bifurcations appear patent.  He has not had any recurrent stroke or TIA symptoms.  Remains on aspirin  she is tolerating well without bruising or bleeding.   States his blood pressure is under good control. Update 01/12/2024 JM: Patient returns for 40-month follow-up visit.  His main concern today is in regards to involuntary movements that started about 4 to 5 months ago, initially present in his jaw and gradually started affecting his legs and now affecting his arms.  He has had difficulty eating due to jaw movement and pain as well as affecting his ability to speak, leg movements can become painful especially at night with a severe cramping sensation.  Reports jaw movements disappear during sleep.  Involuntary movements are present all day long but usually overall mild during the day.  Denies any weakness or changes in his gait, does have chronic gait impairment and imbalance after his stroke in 10/2022 but this has been stable.  He was having slight upper extremity tremor initially but this seems to have subsided.  Unsure family history of similar symptoms as he was adopted.  He was started on Pristiq about 3 months ago which has helped reduce panic attacks but continues to struggle with depression/anxiety.  He also continues to struggle with insomnia, use of Ambien with some benefit. He was previously on Xanax  6mg  daily for multiple years but this was stopped back in 05/2023 after ED visit for altered mental status.  He also reports complete EtOH cessation since that time.  He was previously followed by psychiatry but stopped after Xanax  was discontinued.  Denies starting any new medications around the time of onset.  Overall stable from stroke standpoint.  Denies any current memory concerns, feels brain fog has improved.  Remains on aspirin  and Crestor.  Routinely follows with PCP and cardiology for stroke risk factor management.  Loop recorder has not shown atrial fibrillation thus far.  Previously discussed follow-up with [facility] for untreated sleep apnea but feels he no longer has issues with apnea since he stopped EtOH use.  Update 04/11/2024 : He returns for  follow-up after last visit 6 weeks ago.  He has started Austedo  6 mg twice daily which is tolerating well without side effects but has not noticed significant improvement in his involuntary facial or hand movements.  He takes Klonopin  0.5 mg 2 tablets at bedtime and feels it is not helping as well and actually makes him feel tired and wants to come off it.  He was found to have significant thyroid  dysfunction and saw endocrinologist Dr. Aldona Amel who has adjusted his thyroid  medications and he is feeling much better.  He is not having recurrent stroke or TIA symptoms.  He remains on aspirin  which is tolerating well without side effects. ROS:   14 system review of systems is positive for dizziness, imbalance, fall, memory difficulties and all other systems negative  PMH:  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) 06/05/2023   Altered mental status, unspecified 06/05/2023  Anxiety    Cerebral infarction, unspecified (HCC) 06/05/2023   Cerebral ischemia 06/05/2023   COPD (chronic obstructive pulmonary disease) (HCC)    Depression    Dry eye syndrome    Dyslipidemia 01/05/2023   Dysrhythmia    hx of PAC's   Effusion of elbow joint, right 06/29/2017   Gastric ulcer with perforation but without obstruction (HCC) 04/15/2016   GERD (gastroesophageal reflux disease)    Gout    HTN (hypertension), benign 02/18/2023   Hyperlipidemia    Hypertension    Hypothyroidism    Hypothyroidism, unspecified 06/05/2023   Malignant neoplasm of thyroid  gland (HCC) 06/05/2023   Muscle wasting and atrophy, not elsewhere classified, unspecified site 06/07/2023   Muscle wasting and atrophy, not elsewhere classified, unspecified site 06/07/2023   Nodule of left lung  02/18/2023   Non-restorative sleep 02/18/2023   OSA (obstructive sleep apnea) 02/18/2023   Papillary thyroid  carcinoma s/p total thyroidectomy & CLND 08/27/2017 08/24/2017   Penetrating ulcer of aorta (HCC) 02/18/2023   Postsurgical hypothyroidism 10/01/2017   Protrusion of intervertebral disc of thoracic region 02/18/2023   Seborrheic dermatitis 02/18/2023   Thyroid  cancer (HCC)    2018   Wheezing 06/05/2023    Social History:  Social History   Socioeconomic History   Marital status: Single    Spouse name: Not on file   Number of children: Not on file   Years of education: Not on file   Highest education level: Not on file  Occupational History   Not on file  Tobacco Use   Smoking status: Former    Current packs/day: 0.00    Average packs/day: 1 pack/day for 40.0 years (40.0 ttl pk-yrs)    Types: Cigarettes    Start date: 05/1979    Quit date: 05/2019    Years since quitting: 4.9   Smokeless tobacco: Never  Vaping Use   Vaping status: Never Used  Substance and Sexual Activity   Alcohol use: Not Currently    Alcohol/week: 0.0 standard drinks of alcohol    Comment: Used to drink a lot -moderate - quit 2020   Drug use: Not Currently    Types: Marijuana    Comment: last use years ago   Sexual activity: Not Currently  Other Topics Concern   Not on file  Social History Narrative   Not on file   Social Drivers of Health   Financial Resource Strain: Not on file  Food Insecurity: Not on file  Transportation Needs: Not on file  Physical Activity: Not on file  Stress: Not on file  Social Connections: Unknown (03/30/2022)   Received from [facility]   Social Network    Social Network: Not on file  Intimate Partner Violence: Unknown (03/06/2022)   Received from [facility]   HITS    Physically Hurt: Not on file    Insult or Talk Down To: Not on file    Threaten Physical Harm: Not on file    Scream or Curse: Not on file     Medications:   Current Outpatient Medications on File Prior to Visit  Medication Sig Dispense Refill   albuterol  (VENTOLIN  HFA) 108 (90 Base) MCG/ACT inhaler Inhale 2 puffs into the lungs every 6 (six) hours as needed for wheezing or shortness of breath.     alendronate  (FOSAMAX ) 70 MG tablet TAKE 1 TABLET EVERY 7 DAYS WITH A FULL GLASS OF WATER ON AN EMPTY STOMACH. 12 tablet 3   aspirin  EC 81  MG tablet Take 1 tablet (81 mg total) by mouth daily. Swallow whole.     atenolol  (TENORMIN ) 100 MG tablet Take 100 mg by mouth 2 (two) times daily.     CALCIUM  PO Take 1 tablet by mouth daily.     Cholecalciferol (VITAMIN D ) 2000 units tablet Take 2,000 Units by mouth daily.     clonazePAM  (KLONOPIN ) 0.5 MG tablet Take 2 tablets (1 mg total) by mouth at bedtime. 60 tablet 0   desvenlafaxine (PRISTIQ) 50 MG 24 hr tablet Take 50 mg by mouth daily.     Deutetrabenazine  (AUSTEDO ) 6 MG TABS Take 6 mg by mouth 2 (two) times daily. 60 tablet 5   fenofibrate 160 MG tablet Take 160 mg by mouth daily with supper.      hydrocortisone 2.5 % cream Apply 1 application  topically 2 (two) times daily as needed (rash).     levothyroxine  (SYNTHROID ) 137 MCG tablet Take 1 tablet (137 mcg total) by mouth daily. 45 tablet 3   Multiple Vitamin (MULTIVITAMIN WITH MINERALS) TABS tablet Take 1 tablet by mouth daily.     omeprazole (PRILOSEC) 20 MG capsule Take 20 mg by mouth daily before breakfast.      rosuvastatin (CRESTOR) 10 MG tablet Take 10 mg by mouth daily.      valbenazine  (INGREZZA ) 40 MG capsule Take 1 capsule (40 mg total) by mouth daily. 30 capsule 1   No current facility-administered medications on file prior to visit.    Allergies:   Allergies  Allergen Reactions   Lorazepam Anxiety and Hypertension    Paranoid     Physical Exam General: Obese unkempt middle-aged Caucasian male, seated, in no evident distress Head: head normocephalic and atraumatic.   Neck: supple with no carotid or  supraclavicular bruits Cardiovascular: regular rate and rhythm, no murmurs Musculoskeletal: no deformity Skin:  no rash/petichiae Vascular:  Normal pulses all extremities  Neurologic Exam Mental Status: Awake and fully alert. Oriented to place and time. Recent memory mildly impaired and remote memory intact. Attention span, concentration and fund of knowledge mostly appropriate. Mood and affect appropriate.  Recall 3/3.  Able to name 13 animals which can walk on 4 legs.  Clock drawing 4/4. 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  Extraocular movements full without nystagmus. Visual fields full to confrontation in the left eye.Aaron Aas Hearing intact. Facial sensation intact. Face, tongue, palate moves normally and symmetrically.  Motor: Normal bulk and tone. Normal strength in all tested extremity muscles.  Consistent involuntary mild movements of jaw, head and bilateral lower extremities.  Hand movements appear to be quasi purposeful.  No evidence of resting or action tremor.  No cogwheel rigidity. Sensory.: intact to touch , pinprick , position and vibratory sensation.  Coordination: Rapid alternating movements normal in all extremities. Finger-to-nose and heel-to-shin performed accurately bilaterally.   Gait and Station: Arises from chair without difficulty. Stance is normal. Gait is slightly broad-based demonstrates with moderate unsteadiness, adequate stride length although decreased appetite.  Greater difficulty with turns. No use of AD.  Tandem walk and heel toe not attempted. Reflexes: 1+ and symmetric. Toes downgoing.    ASSESSMENT: 68 year old Caucasian male with subacute balance and gait difficulties for the last 6 months with abnormal MRI scan showing a subacute right cerebellar infarct in December 2023 as well as bilateral subcortical lacunar of remote age.  Vascular risk factors of obesity, hyperlipidemia, hypertension, at risk for obstructive  sleep apnea and silent  cerebral infarcts.  He also has mild cognitive impairment which appears stable.  New involuntary orofacial dyskinesia he is here as well as involuntary movements now affecting hands and feet raising concern for Huntington's.  He has been started on Austedo  and is tolerating it well but is not on optimal dose yet     PLAN:I had a long discussion with the patient with regards to his subacute involuntary movements affecting his face as well as now the hands and feet which may represent dyskinesias  or chorea.  I recommend increase the dose of Austedo  to 12 mg twice daily as it is tolerating it well without side effects but has not obtained any benefit.  Taper Klonopin  to 1 tablet at night for 2 weeks and discontinue as he is having tiredness which may be related and he feels it is not helping.  Continue management of thyroid  dysfunction as per his endocrinologist..  Continue treatment for anxiety as per primary care physician and keep his scheduled appointment to see psychiatry for more optimal management of his anxiety.  Continue aspirin  for stroke prevention and maintain aggressive risk factor modification with strict control of hypertension blood pressure goal below 140/90, lipids with LDL cholesterol goal below 70 mg percent and diabetes with hemoglobin A1c goal below 6.5%.  Return for follow-up in 3 months with nurse practitioner Camilo Cella or call earlier if necessary.  Return for follow-up in 3 months with nurse practitioner Camilo Cella or call earlier if necessary.  Greater than 50% time during this 35 minute visit was spent on  counseling and coordination of care about a cerebellar stroke, silent lacunar strokes and involuntary movements and dyskinesias and answering questions  Ardella Beaver, MD Note: This document was prepared with digital dictation and possible smart phrase technology. Any transcriptional errors that result from this process are unintentional.

## 2024-04-13 ENCOUNTER — Ambulatory Visit: Admitting: Cardiovascular Disease

## 2024-04-25 ENCOUNTER — Other Ambulatory Visit (HOSPITAL_COMMUNITY): Payer: Self-pay

## 2024-04-25 ENCOUNTER — Telehealth: Payer: Self-pay

## 2024-04-25 MED ORDER — AUSTEDO 12 MG PO TABS: 12.0000 mg | ORAL_TABLET | Freq: Two times a day (BID) | ORAL | 5 refills | Status: DC

## 2024-04-25 NOTE — Telephone Encounter (Signed)
 I have corrected the script to reflect the pt taking 12 mg twice a day. 1 tablet BID.

## 2024-04-25 NOTE — Telephone Encounter (Signed)
 Pharmacy Patient Advocate Encounter   Received notification from CoverMyMeds that prior authorization for AUSTEDO  (deutetrabenazine ) 6MG  tablets is required/requested.   Insurance verification completed.   The patient is insured through [employer] .   Per test claim: PA required; PA submitted to above mentioned insurance via CoverMyMeds Key/confirmation #/EOC [id number] Status is pending

## 2024-04-25 NOTE — Telephone Encounter (Signed)
 Nabina from [employer] called stating that the insurance will not cover the pt's Deutetrabenazine  (AUSTEDO ) 6 MG TABS the way it was written in the Rx. Audria Leather is wanting to know if it can be put as 12mg  1 tab BID. Pt is running low. Please advise.  [phone] [facility]

## 2024-04-26 ENCOUNTER — Other Ambulatory Visit (HOSPITAL_COMMUNITY): Payer: Self-pay

## 2024-04-26 NOTE — Telephone Encounter (Signed)
 Pharmacy Patient Advocate Encounter  Received notification from [employer] that Prior Authorization for AUSTEDO  (deutetrabenazine ) 6MG  tablets has been APPROVED from 04/25/2024 to 11/29/2024   PA #/Case ID/Reference #: PA Case: [id number]

## 2024-04-28 ENCOUNTER — Other Ambulatory Visit: Payer: Self-pay

## 2024-04-28 ENCOUNTER — Ambulatory Visit (HOSPITAL_BASED_OUTPATIENT_CLINIC_OR_DEPARTMENT_OTHER)
Admission: RE | Admit: 2024-04-28 | Discharge: 2024-04-28 | Disposition: A | Discharge status: Home / Self Care | Source: Ambulatory Visit | Attending: Hematology & Oncology | Admitting: Hematology & Oncology

## 2024-04-28 ENCOUNTER — Ambulatory Visit (INDEPENDENT_AMBULATORY_CARE_PROVIDER_SITE_OTHER): Payer: Medicare HMO

## 2024-04-28 ENCOUNTER — Inpatient Hospital Stay: Payer: Medicare HMO | Attending: Hematology & Oncology

## 2024-04-28 ENCOUNTER — Inpatient Hospital Stay: Payer: Medicare HMO | Admitting: Hematology & Oncology

## 2024-04-28 ENCOUNTER — Encounter: Payer: Self-pay | Admitting: Hematology & Oncology

## 2024-04-28 VITALS — BP 127/61 | HR 57 | Temp 97.9°F | Resp 20 | Ht 73.0 in | Wt 259.0 lb

## 2024-04-28 DIAGNOSIS — C3412 Malignant neoplasm of upper lobe, left bronchus or lung: Secondary | ICD-10-CM | POA: Diagnosis not present

## 2024-04-28 DIAGNOSIS — I639 Cerebral infarction, unspecified: Secondary | ICD-10-CM | POA: Diagnosis not present

## 2024-04-28 DIAGNOSIS — C3492 Malignant neoplasm of unspecified part of left bronchus or lung: Secondary | ICD-10-CM

## 2024-04-28 DIAGNOSIS — Z923 Personal history of irradiation: Secondary | ICD-10-CM | POA: Diagnosis not present

## 2024-04-28 DIAGNOSIS — I700 Atherosclerosis of aorta: Secondary | ICD-10-CM | POA: Diagnosis not present

## 2024-04-28 DIAGNOSIS — C3490 Malignant neoplasm of unspecified part of unspecified bronchus or lung: Secondary | ICD-10-CM | POA: Diagnosis not present

## 2024-04-28 LAB — CBC WITH DIFFERENTIAL (CANCER CENTER ONLY)
Abs Immature Granulocytes: 0.03 10*3/uL (ref 0.00–0.07)
Basophils Absolute: 0.1 10*3/uL (ref 0.0–0.1)
Basophils Relative: 2 %
Eosinophils Absolute: 0.2 10*3/uL (ref 0.0–0.5)
Eosinophils Relative: 3 %
HCT: 43.2 % (ref 39.0–52.0)
Hemoglobin: 13.9 g/dL (ref 13.0–17.0)
Immature Granulocytes: 0 %
Lymphocytes Relative: 33 %
Lymphs Abs: 2.6 10*3/uL (ref 0.7–4.0)
MCH: 30.3 pg (ref 26.0–34.0)
MCHC: 32.2 g/dL (ref 30.0–36.0)
MCV: 94.1 fL (ref 80.0–100.0)
Monocytes Absolute: 0.6 10*3/uL (ref 0.1–1.0)
Monocytes Relative: 8 %
Neutro Abs: 4.3 10*3/uL (ref 1.7–7.7)
Neutrophils Relative %: 54 %
Platelet Count: 255 10*3/uL (ref 150–400)
RBC: 4.59 MIL/uL (ref 4.22–5.81)
RDW: 12.2 % (ref 11.5–15.5)
WBC Count: 7.8 10*3/uL (ref 4.0–10.5)
nRBC: 0 % (ref 0.0–0.2)

## 2024-04-28 LAB — CMP (CANCER CENTER ONLY)
ALT: 11 U/L (ref 0–44)
AST: 17 U/L (ref 15–41)
Albumin: 4.8 g/dL (ref 3.5–5.0)
Alkaline Phosphatase: 25 U/L — ABNORMAL LOW (ref 38–126)
Anion gap: 9 (ref 5–15)
BUN: 27 mg/dL — ABNORMAL HIGH (ref 8–23)
CO2: 29 mmol/L (ref 22–32)
Calcium: 8.6 mg/dL — ABNORMAL LOW (ref 8.9–10.3)
Chloride: 104 mmol/L (ref 98–111)
Creatinine: 1.34 mg/dL — ABNORMAL HIGH (ref 0.61–1.24)
GFR, Estimated: 58 mL/min — ABNORMAL LOW (ref >60)
Glucose, Bld: 124 mg/dL — ABNORMAL HIGH (ref 70–99)
Potassium: 4.4 mmol/L (ref 3.5–5.1)
Sodium: 142 mmol/L (ref 135–145)
Total Bilirubin: 0.6 mg/dL (ref 0.0–1.2)
Total Protein: 7.3 g/dL (ref 6.5–8.1)

## 2024-04-28 LAB — CUP PACEART REMOTE DEVICE CHECK
Date Time Interrogation Session: 20250529232312
Implantable Pulse Generator Implant Date: 20240514

## 2024-04-28 NOTE — Progress Notes (Signed)
 [name]  Timothy Conway [id number] [birth year] 68 y.o. 04/28/2024   Principle Diagnosis:  Stage Ia (T1N0M0) adenocarcinoma of the left upper lung  Current Therapy:   Status post radiosurgery-5400 rad that was completed in 08/20/2020     Interim History:  [name] is back for follow-up.  We last saw him 6 months ago.  He is doing pretty well.  He really has had no complaints.  He [name] has had no problems with cough or shortness of breath.  He has had no nausea or vomiting.  Has had no change in bowel or bladder habits.  He did have a CT scan done today.  Unfortunately, results not back yet.  He has had no problems with COVID or Influenza.  He is planning for his quiet Summer.  He has had no rashes.  He has had no bleeding.  He is now on medication for tardive dyskinesia.  I think the medication is Austedo .  Overall, I would say his performance status is probably ECOG 1.      Medications:  Current Outpatient Medications:    alendronate  (FOSAMAX ) 70 MG tablet, TAKE 1 TABLET EVERY 7 DAYS WITH A FULL GLASS OF WATER ON AN EMPTY STOMACH., Disp: 12 tablet, Rfl: 3   aspirin  EC 81 MG tablet, Take 1 tablet (81 mg total) by mouth daily. Swallow whole., Disp: , Rfl:    atenolol  (TENORMIN ) 100 MG tablet, Take 100 mg by mouth 2 (two) times daily., Disp: , Rfl:    CALCIUM  PO, Take 1 tablet by mouth daily., Disp: , Rfl:    Cholecalciferol (VITAMIN D ) 2000 units tablet, Take 2,000 Units by mouth daily., Disp: , Rfl:    desvenlafaxine (PRISTIQ) 50 MG 24 hr tablet, Take 50 mg by mouth daily., Disp: , Rfl:    Deutetrabenazine  (AUSTEDO ) 12 MG TABS, Take 1 tablet (12 mg total) by mouth in the morning and at bedtime., Disp: 60 tablet, Rfl: 5   fenofibrate 160 MG tablet, Take 160 mg by mouth daily with supper. , Disp: , Rfl:    hydrocortisone 2.5 % cream, Apply 1 application  topically 2 (two) times daily as needed (rash)., Disp: , Rfl:    levothyroxine  (SYNTHROID )  137 MCG tablet, Take 1 tablet (137 mcg total) by mouth daily., Disp: 45 tablet, Rfl: 3   Multiple Vitamin (MULTIVITAMIN WITH MINERALS) TABS tablet, Take 1 tablet by mouth daily., Disp: , Rfl:    omeprazole (PRILOSEC) 20 MG capsule, Take 20 mg by mouth daily before breakfast. , Disp: , Rfl:    rosuvastatin (CRESTOR) 10 MG tablet, Take 10 mg by mouth daily. , Disp: , Rfl:    zolpidem (AMBIEN) 10 MG tablet, Take 10 mg by mouth at bedtime as needed., Disp: , Rfl:   Allergies:  Allergies  Allergen Reactions   Lorazepam Anxiety and Hypertension    Paranoid     Past Medical History, Surgical history, Social history, and Family History were reviewed and updated.  Review of Systems: Review of Systems  Constitutional: Negative.   HENT:  Negative.    Eyes: Negative.   Respiratory: Negative.    Cardiovascular: Negative.   Gastrointestinal: Negative.  Negative for abdominal distention.  Endocrine: Negative.   Genitourinary: Negative.    Musculoskeletal:  Positive for gait problem.  Skin: Negative.   Neurological:  Positive for gait problem.  Hematological: Negative.   Psychiatric/Behavioral: Negative.      Physical Exam:  height is 6\' 1"  (1.854 m)  and weight is 259 lb (117.5 kg). His oral temperature is 97.9 F (36.6 C). His blood pressure is 127/61 and his pulse is 57 (abnormal). His respiration is 20 and oxygen saturation is 98%.   Wt Readings from Last 3 Encounters:  04/28/24 259 lb (117.5 kg)  04/11/24 256 lb (116.1 kg)  03/01/24 245 lb (111.1 kg)    Physical Exam Vitals reviewed.  HENT:     Head: Normocephalic and atraumatic.  Eyes:     Pupils: Pupils are equal, round, and reactive to light.  Cardiovascular:     Rate and Rhythm: Normal rate and regular rhythm.     Heart sounds: Normal heart sounds.  Pulmonary:     Effort: Pulmonary effort is normal.     Breath sounds: Normal breath sounds.  Abdominal:     General: Bowel sounds are normal.     Palpations: Abdomen is  soft.  Musculoskeletal:        General: No tenderness or deformity. Normal range of motion.     Cervical back: Normal range of motion.  Lymphadenopathy:     Cervical: No cervical adenopathy.  Skin:    General: Skin is warm and dry.     Findings: No erythema or rash.  Neurological:     Mental Status: He is alert and oriented to person, place, and time.  Psychiatric:        Behavior: Behavior normal.        Thought Content: Thought content normal.        Judgment: Judgment normal.    Lab Results  Component Value Date   WBC 7.8 04/28/2024   HGB 13.9 04/28/2024   HCT 43.2 04/28/2024   MCV 94.1 04/28/2024   PLT 255 04/28/2024     Chemistry      Component Value Date/Time   NA 142 04/28/2024 0937   K 4.4 04/28/2024 0937   CL 104 04/28/2024 0937   CO2 29 04/28/2024 0937   BUN 27 (H) 04/28/2024 0937   CREATININE 1.34 (H) 04/28/2024 0937      Component Value Date/Time   CALCIUM  8.6 (L) 04/28/2024 0937   ALKPHOS 25 (L) 04/28/2024 0937   AST 17 04/28/2024 0937   ALT 11 04/28/2024 0937   BILITOT 0.6 04/28/2024 0937      Impression and Plan: [name] is a very nice 69 year old white male.  He had a non small cell lung cancer of the left upper lung.  This was an adenocarcinoma.  It was a low-grade adenocarcinoma based on the low level of activity on the PET scan.  He underwent radiosurgery for this.  He had this back in 2021.  At this point, I think we probably just do chest x-rays.  We will see him back in 6 months, we will do another chest x-ray.    Ivor Mars, MD  5/30/202510:57 AM

## 2024-05-01 NOTE — Progress Notes (Signed)
 [facility] Summary Report / Loop Recorder

## 2024-05-03 ENCOUNTER — Ambulatory Visit: Payer: Self-pay | Admitting: Cardiovascular Disease

## 2024-05-08 ENCOUNTER — Encounter: Payer: Self-pay | Admitting: *Deleted

## 2024-05-08 ENCOUNTER — Ambulatory Visit: Payer: Self-pay | Admitting: Hematology & Oncology

## 2024-05-22 ENCOUNTER — Ambulatory Visit: Admitting: Cardiovascular Disease

## 2024-05-30 ENCOUNTER — Ambulatory Visit (INDEPENDENT_AMBULATORY_CARE_PROVIDER_SITE_OTHER)

## 2024-05-30 DIAGNOSIS — I639 Cerebral infarction, unspecified: Secondary | ICD-10-CM

## 2024-05-30 LAB — CUP PACEART REMOTE DEVICE CHECK
Date Time Interrogation Session: 20250630233303
Implantable Pulse Generator Implant Date: 20240514

## 2024-06-01 ENCOUNTER — Ambulatory Visit: Admitting: Internal Medicine

## 2024-06-04 ENCOUNTER — Ambulatory Visit: Payer: Self-pay | Admitting: Cardiovascular Disease

## 2024-06-05 ENCOUNTER — Encounter

## 2024-06-12 ENCOUNTER — Ambulatory Visit: Admitting: Internal Medicine

## 2024-06-12 ENCOUNTER — Encounter: Payer: Self-pay | Admitting: Internal Medicine

## 2024-06-12 VITALS — BP 128/70 | HR 70 | Ht 73.0 in | Wt 264.6 lb

## 2024-06-12 DIAGNOSIS — M810 Age-related osteoporosis without current pathological fracture: Secondary | ICD-10-CM | POA: Diagnosis not present

## 2024-06-12 DIAGNOSIS — C73 Malignant neoplasm of thyroid gland: Secondary | ICD-10-CM

## 2024-06-12 DIAGNOSIS — E890 Postprocedural hypothyroidism: Secondary | ICD-10-CM

## 2024-06-12 DIAGNOSIS — E559 Vitamin D deficiency, unspecified: Secondary | ICD-10-CM

## 2024-06-12 LAB — POCT GLYCOSYLATED HEMOGLOBIN (HGB A1C): Hemoglobin A1C: 5.5 % (ref 4.0–5.6)

## 2024-06-12 NOTE — Patient Instructions (Addendum)
 Please continue Synthroid  137 mcg daily.  Take the thyroid  hormone every day, with water, at least 30 minutes before breakfast, separated by at least 4 hours from: - acid reflux medications - calcium  - iron - multivitamins  Please stop at the lab.  Continue vitamin D  2000 units daily.  Continue Fosamax  70 mg weekly.  Please come back for a follow-up appointment in 6 months.

## 2024-06-12 NOTE — Progress Notes (Signed)
 Patient ID: Timothy Conway, male   DOB: 03/26/1956, 68 y.o.   MRN: [id number]   HPI  Timothy Conway is a 68 y.o.-year-old male, initially referred by Dr. Eletha, presenting for follow-up for papillary thyroid  cancer, postsurgical hypothyroidism, and osteoporosis.  Last visit 5 months ago.  Interim history: In 2021, he was diagnosed with 11 mm left adenocarcinoma. He had radiotactic RxTx.  No shortness of breath or chest pain. He has dystonia due to being on higher doses of Xanax  for alcoholism in the past (he quit drinking and smoking in 2024) >> started Klonopin  and was also on physical therapy for this.  This is mostly affecting his mouth and hands. Now on Austedo  >> not helping.  He is embarrassed to go out in public due to the continues movement of his hands and mouth. No problems swallowing, but he occasionally has problems clearing his throat.  ThyCA: Reviewed and addended his thyroid  cancer history: Pt. has been found to have a hypermetabolic thyroid  nodule on a PET scan (07/13/2017) obtained by Dr. Shelah, his pulmonologist, to investigate a lung nodule.  A thyroid  U/S (07/30/2017) showed a L lobe 1.3 x 1.0 x 1.2 cm nodule, solid, hypoechoic, lobulated/irregular, with peripheral calcifications. Bx of this nodule (07/30/2017): PTC  Total thyroidectomy (08/27/2017) by Dr. Eletha: Diagnosis 1. Thyroid , thyroidectomy, total - PAPILLARY THYROID  CARCINOMA, CLASSIC TYPE, SPANNING 1.4 CM. - NO EXTRATHYROIDAL EXTENSION. - RESECTION MARGINS ARE NEGATIVE. - SEE ONCOLOGY TABLE. 2. Lymph node, biopsy, central compartment - THREE OF THREE LYMPH NODES NEGATIVE FOR CARCINOMA (0/3). - ONE BENIGN PARATHYROID. Microscopic Comment 1. THYROID  Specimen: Total thyroid  with central compartment lymph nodes. Procedure (including lymph node sampling if applicable): Total thyroidectomy with central lymph node biopsy. Specimen Integrity (intact/fragmented): Intact. Tumor focality: Unifocal. Dominant  tumor: Maximum tumor size (cm): 1.4 cm. Tumor laterality: Left. Histologic type (including subtype and/or unique features as applicable): Papillary thyroid  carcinoma, classic type. Tumor capsule: Present. Extrathyroidal extension: Absent. Capsular invasion with degree of invasion if present: N/A. Margins: Negative. Lymphatic or vascular invasion: Not identified. Lymph nodes: # examined 3; # positive; 0 Extracapsular extension (if applicable): N/A. TNM code: pT1b, pN0 Non-neoplastic thyroid : Largely unremarkable. Comments: There tumor is largely fibrotic and calcified with only a small portion of viable tumor.  Neck U/S (09/25/2019): The thyroid  gland is surgically absent. Evaluation of the left and right resection bed demonstrates no evidence of residual thyroid  tissue or nodularity. No suspicious lymphadenopathy is identified. IMPRESSION: Surgical changes of prior total thyroidectomy without evidence of residual or recurrent thyroid  tissue, nodularity or lymphadenopathy.  PET/CT (09/15/2020): Increased uptake in the base of the tongue and anterior paravertebral muscles  CT neck (07/24/2020): Increased uptake is most likely related to tonsillar inflammation  CT chest (11/14/2020) without contrast: No suspicious masses in mediastinum or neck.  CT chest (06/24/2021): Mediastinum/Nodes: No enlarged mediastinal, hilar, or axillary lymph nodes. Thyroid  gland, trachea, and esophagus demonstrate no significant findings.  CT chest (10/16/2021): Unchanged appearance of predominantly bandlike post radiation fibrosis and adjacent ground-glass about fiducial markers of the anterior left upper lobe. No evidence of recurrent or metastatic disease in the chest.  CT chest (04/15/2022): Mediastinum/Nodes: No pathologically enlarged mediastinal or hilar lymph nodes. Please note that accurate exclusion of hilar adenopathy is limited on noncontrast CT scans. Esophagus is unremarkable  in appearance. No axillary lymphadenopathy.   Lungs/Pleura: Nodular area of architectural distortion surrounded by septal thickening and ground-glass attenuation in the left upper lobe, adjacent to 2 fiducial markers, compatible with  a treated neoplasm. This appears grossly stable compared to the prior study. No other new suspicious appearing pulmonary nodules or masses are noted. When compared to the prior study there has been worsening scarring/atelectasis in the right middle lobe, predominantly in the lateral segment.   IMPRESSION: 1. Stable appearance of treated neoplasm in the left upper lobe. No definitive findings to suggest residual or metastatic disease in the thorax. 2. Worsening scarring/atelectasis in the lateral segment of the right middle lobe. 3. Aortic atherosclerosis. 4. Hepatic steatosis. 5. Additional incidental findings, as above.  CT chest (04/09/2023): 1. Unchanged post treatment/post radiation appearance of the anterior left upper lobe with irregular scarring and volume loss with biopsy marking clips and or fiducial markers. Most discrete residual nodule anteriorly measures 1.1 x 0.8 cm, unchanged. 2. No evidence of recurrent or metastatic disease in the chest. 3. Mildly coarse contour of the liver, suggestive of cirrhosis.  CT chest (04/28/2024): 1. Similar appearance of post treatment changes in the left upper lobe. 2. Slight interval increase in the size of several ground-glass nodules in the anterior left upper lobe since the prior CT. No new nodule or suspicious lesion. 3.  Aortic Atherosclerosis (ICD10-I70.0).  Thyroglobulin is detectable: Lab Results  Component Value Date   THYROGLB 0.2 (L) 03/14/2024   THYROGLB 0.4 (L) 01/26/2024   THYROGLB 0.2 (L) 07/26/2023   THYROGLB 0.2 (L) 06/05/2022   THYROGLB 0.3 (L) 09/11/2021   THYROGLB 0.1 (L) 10/29/2020   THYROGLB 0.7 (L) 09/17/2020   THYROGLB 0.1 (L) 09/14/2019   THYROGLB 0.2 (L) 09/08/2018    THYROGLB 0.1 (L) 03/23/2018   His antithyroid antibodies are undetectable: Lab Results  Component Value Date   THGAB <1 01/26/2024   THGAB <1 07/26/2023   THGAB <1 06/05/2022   THGAB <1 09/11/2021   THGAB <1 10/29/2020   THGAB <1 09/17/2020   THGAB <1 09/14/2019   THGAB <1 09/08/2018   THGAB <1 03/23/2018   Postsurgical hypothyroidism  He is on Levothyroxine  137 mcg daily (increased 01/2024) - in am - fasting - at least 30 min from b'fast (however, he usually skips breakfast) - + calcium  1x a day with dinner - no iron - + multivitamins at night - + PPIs at night (Omeprazole) - not on Biotin + Magnesium and Zinc - at night.  Reviewed his TFTs: Lab Results  Component Value Date   TSH 1.11 03/14/2024   TSH 7.49 (H) 01/26/2024   TSH 1.87 07/26/2023   TSH 0.602 02/18/2023   TSH 1.60 06/05/2022   TSH 0.93 12/12/2021   TSH 0.19 (L) 10/28/2021   TSH 0.15 (L) 09/11/2021   TSH 0.76 12/25/2020   TSH 0.28 (L) 10/29/2020   FREET4 1.5 03/14/2024   FREET4 1.19 07/26/2023   FREET4 1.13 06/05/2022   FREET4 1.10 12/12/2021   FREET4 0.89 10/28/2021   FREET4 1.17 09/11/2021   FREET4 1.19 12/25/2020   FREET4 1.13 10/29/2020   FREET4 0.77 09/17/2020   FREET4 1.04 09/14/2019     Pt denies: - feeling nodules in neck - hoarseness - dysphagia - choking  Unknown family history of thyroid  disease or thyroid  cancer since he was adopted. No h/o radiation tx to head or neck. No herbal supplements. No Biotin use. No recent steroids use.   Clinical osteoporosis:  He was found to have a vertebral fracture after fall from level ground Spring 2022. He fell onto his tailbone and fractured 1 vertebra.  He had kyphoplasty and then had another compression fracture.  His back pain improved since then.    MRI lumbar spine (08/09/2021): 1. Compression fracture of the T11 vertebral body status post vertebral augmentation with persistent marrow edema. 2. Minor compression fracture of the  superior endplate of T12 on the right side with persistent marrow edema without significant height loss.  CT chest (04/15/2022): Musculoskeletal: Chronic compression fracture of T11 with 30% loss of anterior vertebral body height. Post vertebroplasty changes are noted at T11 and T12. Several old healed bilateral rib fractures are again noted. There are no aggressive appearing lytic or blastic lesions noted in the visualized portions of the skeleton.  CT chest (04/09/2023): No acute osseous findings. Unchanged vertebral cement augmentation of T11 and T12.  CT chest w/o contrast (09/27/2023): 1. Stable left upper lobe solid nodule with adjacent opacities which are likely due to postradiation change. No evidence of recurrent or metastatic disease in the chest. 2. Stable small solid and ground-glass pulmonary nodules. Recommend attention on follow-up. 3.  Aortic Atherosclerosis (ICD10-I70.0).   He had a bone density scan several years ago in [facility] reportedly showing osteopenia.  Reviewed latest bone density scan reports: 10/14/2023 [facility]) Lumbar spine L1-L3 (L4) Femoral neck (FN) 33% Distal radius Ultra distal radius  T-score  1.8 RFN: -1.5 LFN: -1.0 0.4 0.0   09/16/2021 ([place]) Lumbar spine L1-L4 Femoral neck (FN)  T-score  1.2 RFN: -1.0 LFN: -1.2    We started Fosamax  70 mg weekly in 08/2021.  He tolerates this well.  No other fractures as an adult, but had a wrist fracture as a child.  He has a history of vitamin D  deficiency, but latest level was normal: Lab Results  Component Value Date   VD25OH 35 01/26/2024   VD25OH 42.01 07/26/2023   VD25OH 49.05 06/05/2022   VD25OH 60.58 09/11/2021   He is on vitamin D  2000 units daily.  Latest kidney function: Lab Results  Component Value Date   BUN 27 (H) 04/28/2024   BUN 19 10/15/2023   CREATININE 1.34 (H) 04/28/2024   CREATININE 1.14 10/15/2023   04/28/2024: GFR 58. No results found for:  GFR  Per review the chart, he presented with AMS 3 days after stopping alcohol 05/30/2023-previously drinking 9-10 drinks a day.  A CT of the head at that time showed chronic bilateral basal ganglia and cerebellar infarcts. He quit drinking afterwards.  ROS: + See HPI + tremors  I reviewed pt's medications, allergies, PMH, social hx, family hx, and changes were documented in the history of present illness. Otherwise, unchanged from my initial visit note.  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) 06/05/2023   Altered mental status, unspecified 06/05/2023   Anxiety    Cerebral infarction, unspecified (HCC) 06/05/2023   Cerebral ischemia 06/05/2023   COPD (chronic obstructive pulmonary disease) (HCC)    Depression    Dry eye syndrome    Dyslipidemia 01/05/2023   Dysrhythmia    hx of PAC's   Effusion of elbow joint, right 06/29/2017   Gastric ulcer with perforation but without obstruction (HCC) 04/15/2016   GERD (gastroesophageal reflux disease)    Gout    HTN (hypertension), benign 02/18/2023   Hyperlipidemia    Hypertension    Hypothyroidism    Hypothyroidism, unspecified 06/05/2023   Malignant neoplasm of thyroid  gland (HCC) 06/05/2023   Muscle  wasting and atrophy, not elsewhere classified, unspecified site 06/07/2023   Muscle wasting and atrophy, not elsewhere classified, unspecified site 06/07/2023   Nodule of left lung 02/18/2023   Non-restorative sleep 02/18/2023   OSA (obstructive sleep apnea) 02/18/2023   Papillary thyroid  carcinoma s/p total thyroidectomy & CLND 08/27/2017 08/24/2017   Penetrating ulcer of aorta (HCC) 02/18/2023   Postsurgical hypothyroidism 10/01/2017   Protrusion of intervertebral disc of thoracic region  02/18/2023   Seborrheic dermatitis 02/18/2023   Thyroid  cancer (HCC)    2018   Wheezing 06/05/2023   Past Surgical History:  Procedure Laterality Date   ABDOMINAL SURGERY     hemmoraging ulcer   BRONCHIAL BIOPSY  06/25/2020   Procedure: BRONCHIAL BIOPSIES;  Surgeon: Shelah Lamar RAMAN, MD;  Location: [facility] ENDOSCOPY;  Service: Pulmonary;;   BRONCHIAL BRUSHINGS  06/25/2020   Procedure: BRONCHIAL BRUSHINGS;  Surgeon: Shelah Lamar RAMAN, MD;  Location: [facility] ENDOSCOPY;  Service: Pulmonary;;   BRONCHIAL NEEDLE ASPIRATION BIOPSY  06/25/2020   Procedure: BRONCHIAL NEEDLE ASPIRATION BIOPSIES;  Surgeon: Shelah Lamar RAMAN, MD;  Location: [facility] ENDOSCOPY;  Service: Pulmonary;;   BRONCHIAL WASHINGS  06/25/2020   Procedure: BRONCHIAL WASHINGS;  Surgeon: Shelah Lamar RAMAN, MD;  Location: [facility] ENDOSCOPY;  Service: Pulmonary;;   CATARACT EXTRACTION Right    COLONOSCOPY     EYE SURGERY Right    x 4  between ages 26-13   FIDUCIAL MARKER PLACEMENT  06/25/2020   Procedure: FIDUCIAL MARKER PLACEMENT;  Surgeon: Shelah Lamar RAMAN, MD;  Location: [facility] ENDOSCOPY;  Service: Pulmonary;;   THYROIDECTOMY N/A 08/27/2017   Procedure: TOTAL THYROIDECTOMY WITH LIMITED LYMPH NODE DISSECTION;  Surgeon: Eletha Boas, MD;  Location: [facility] ORS;  Service: General;  Laterality: N/A;   UPPER GI ENDOSCOPY     VIDEO BRONCHOSCOPY WITH ENDOBRONCHIAL NAVIGATION N/A 06/25/2020   Procedure: VIDEO BRONCHOSCOPY WITH ENDOBRONCHIAL NAVIGATION;  Surgeon: Shelah Lamar RAMAN, MD;  Location: [facility] ENDOSCOPY;  Service: Pulmonary;  Laterality: N/A;   Social History   Social History   Marital status: Single    Spouse name: N/A   Number of children: 0   Occupational History   n/a   Social History Main Topics   Smoking status: Current Every Day Smoker    Packs/day: 1.00    Years: 40.00    Types: Cigarettes   Smokeless tobacco: Never Used     Comment: 1 1/2 per day   Alcohol use 0.6 oz/week    3-4 Cans of beer per day   Drug use: No   Current Outpatient Medications  on File Prior to Visit  Medication Sig Dispense Refill   alendronate  (FOSAMAX ) 70 MG tablet TAKE 1 TABLET EVERY 7 DAYS WITH A FULL GLASS OF WATER ON AN EMPTY STOMACH. 12 tablet 3   aspirin  EC 81 MG tablet Take 1 tablet (81 mg total) by mouth daily. Swallow whole.     atenolol  (TENORMIN ) 100 MG tablet Take 100 mg by mouth 2 (two) times daily.     CALCIUM  PO Take 1 tablet by mouth daily.     Cholecalciferol (VITAMIN D ) 2000 units tablet Take 2,000 Units by mouth daily.     desvenlafaxine (PRISTIQ) 50 MG 24 hr tablet Take 50 mg by mouth daily.     Deutetrabenazine  (AUSTEDO ) 12 MG TABS Take 1 tablet (12 mg total) by mouth in the morning and at bedtime. 60 tablet 5   fenofibrate 160 MG tablet Take 160 mg by mouth daily with supper.      hydrocortisone 2.5 %  cream Apply 1 application  topically 2 (two) times daily as needed (rash).     levothyroxine  (SYNTHROID ) 137 MCG tablet Take 1 tablet (137 mcg total) by mouth daily. 45 tablet 3   Multiple Vitamin (MULTIVITAMIN WITH MINERALS) TABS tablet Take 1 tablet by mouth daily.     omeprazole (PRILOSEC) 20 MG capsule Take 20 mg by mouth daily before breakfast.      rosuvastatin (CRESTOR) 10 MG tablet Take 10 mg by mouth daily.      zolpidem (AMBIEN) 10 MG tablet Take 10 mg by mouth at bedtime as needed.     No current facility-administered medications on file prior to visit.   Allergies  Allergen Reactions   Lorazepam Anxiety and Hypertension    Paranoid    Family History  Adopted: Yes   PE: BP 128/70   Pulse 70   Ht 6' 1 (1.854 m)   Wt 264 lb 9.6 oz (120 kg)   SpO2 97%   BMI 34.91 kg/m  Wt Readings from Last 3 Encounters:  06/12/24 264 lb 9.6 oz (120 kg)  04/28/24 259 lb (117.5 kg)  04/11/24 256 lb (116.1 kg)   Constitutional: overweight, in NAD Eyes: EOMI, no exophthalmos ENT:no neck masses palpated, no cervical lymphadenopathy Cardiovascular: RRR, No MRG Respiratory: CTA B Musculoskeletal: no deformities Skin: + Facial  rash Neurological: + tremor with outstretched hands  ASSESSMENT: 1. Thyroid  cancer - see HPI  2. Postsurgical Hypothyroidism  3. Vertebral fracture  PLAN:  1. Papillary Thyroid  Cancer -Patient with history of classic type papillary thyroid  cancer not very large, encapsulated, without extrathyroidal extension, without lymphovascular invasion and negative lymph node biopsies.  Therefore, his cancer was considered low risk and we decided to continue without RAI treatment per the 2015 ATA thyroid  cancer guidelines.  We are following him with thyroglobulin tumor markers and also with neck ultrasounds. We did discuss that even if he ends up needing RAI treatment as a salvage therapy in the future, his prognosis would likely not be changed. - Neck ultrasound did not show any metastases or recurrence of his neck.  Also PET scan and CT scans obtained during lung cancer staging did not show any thyroid  cancer recurrence or metastasis.  He had another CT scan since last visit and this again did not show signs of thyroid  cancer metastasis - His thyroglobulin levels are not undetectable, since he did not have RAI treatment.  At last visit, thyroglobulin level was slightly higher, at 0.4, but the TSH was also elevated at that time.  We increased his levothyroxine  dose and a subsequent thyroglobulin level was lower, at his baseline, 0.2. - At today's visit we will recheck his thyroglobulin and thyroglobulin antibodies -I will see him back in 6 months per his preference (he would not want to spread the appointments a year apart)  2. Patient with history of total thyroidectomy for thyroid  cancer now with iatrogenic hypothyroidism on levothyroxine  therapy - latest thyroid  labs reviewed with pt. >> normal: Lab Results  Component Value Date   TSH 1.11 03/14/2024  - he continues on LT4 137 mcg daily - pt feels good on this dose. - we discussed about taking the thyroid  hormone every day, with water, >30 minutes  before breakfast, separated by >4 hours from acid reflux medications, calcium , iron, multivitamins. Pt. is taking it correctly. - will check thyroid  tests today: TSH and fT4 - If labs are abnormal, he will need to return for repeat TFTs in 1.5 months  3.  Clinical osteoporosis - No falls or fractures since last visit -In 2022, he had 2 vertebral fractures as a consequence of a fall from level ground, qualifying as fragility fractures.  This puts him into the osteoporotic category.  He had kyphoplasty and back pain improved afterwards. - He continues on vitamin D  2000 units daily with the latest vitamin D  level  normal 5 months ago. -We checked a bone density scan and this showed osteopenia, however, due to the presence of fragility fractures, he qualified for clinical osteoporosis.  We repeated a bone density after last visit but this was checked unfortunately on another machine so the T-scores are not directly comparable.  Overall, T-scores appear to be fairly stable at the left femoral neck, and may be worse in the right femoral neck, but again, not directly comparable with the previous report.  Going forward, we discussed about continuing using the same machine -He has a history of radiation therapy, and therefore a contraindication to Tymlos and Forteo.  We ended up starting Fosamax  70 mg weekly in 2022.  He tolerates this well, without jaw/hip/thigh pain.  He takes it correctly, 30 minutes before levothyroxine , with a full glass of water. - To continue Fosamax  for 5 years but repeat the bone density scan 2 years from the previous, which is next year  Orders Placed This Encounter  Procedures   TSH   T4, free   Thyroglobulin Level   Thyroglobulin antibody   Lela Fendt, MD PhD [facility] Endocrinology

## 2024-06-14 ENCOUNTER — Encounter: Payer: Self-pay | Admitting: Internal Medicine

## 2024-06-14 DIAGNOSIS — C3492 Malignant neoplasm of unspecified part of left bronchus or lung: Secondary | ICD-10-CM | POA: Diagnosis not present

## 2024-06-14 DIAGNOSIS — Z0001 Encounter for general adult medical examination with abnormal findings: Secondary | ICD-10-CM | POA: Diagnosis not present

## 2024-06-14 DIAGNOSIS — E039 Hypothyroidism, unspecified: Secondary | ICD-10-CM | POA: Diagnosis not present

## 2024-06-14 DIAGNOSIS — Z1211 Encounter for screening for malignant neoplasm of colon: Secondary | ICD-10-CM | POA: Diagnosis not present

## 2024-06-14 DIAGNOSIS — J449 Chronic obstructive pulmonary disease, unspecified: Secondary | ICD-10-CM | POA: Diagnosis not present

## 2024-06-14 DIAGNOSIS — Z79899 Other long term (current) drug therapy: Secondary | ICD-10-CM | POA: Diagnosis not present

## 2024-06-14 DIAGNOSIS — Z125 Encounter for screening for malignant neoplasm of prostate: Secondary | ICD-10-CM | POA: Diagnosis not present

## 2024-06-14 DIAGNOSIS — E7800 Pure hypercholesterolemia, unspecified: Secondary | ICD-10-CM | POA: Diagnosis not present

## 2024-06-14 DIAGNOSIS — I719 Aortic aneurysm of unspecified site, without rupture: Secondary | ICD-10-CM | POA: Diagnosis not present

## 2024-06-14 DIAGNOSIS — Z85850 Personal history of malignant neoplasm of thyroid: Secondary | ICD-10-CM | POA: Diagnosis not present

## 2024-06-14 LAB — THYROGLOBULIN LEVEL: Thyroglobulin: 0.5 ng/mL — ABNORMAL LOW

## 2024-06-14 LAB — THYROGLOBULIN ANTIBODY: Thyroglobulin Ab: <1 [IU]/mL (ref <=1)

## 2024-06-14 LAB — TSH: TSH: 9.09 m[IU]/L — ABNORMAL HIGH (ref 0.40–4.50)

## 2024-06-14 LAB — T4, FREE: Free T4: 1.3 ng/dL (ref 0.8–1.8)

## 2024-06-14 NOTE — Progress Notes (Signed)
 [facility] Summary Report / Loop Recorder

## 2024-06-15 ENCOUNTER — Ambulatory Visit: Payer: Self-pay | Admitting: Internal Medicine

## 2024-06-15 MED ORDER — LEVOTHYROXINE SODIUM 150 MCG PO TABS: 150.0000 ug | ORAL_TABLET | Freq: Every day | ORAL | 6 refills | Status: DC

## 2024-06-15 NOTE — Addendum Note (Signed)
 Addended by: TRIXIE FILE on: 06/15/2024 01:27 PM   Modules accepted: Orders

## 2024-06-29 ENCOUNTER — Ambulatory Visit: Admitting: Adult Health

## 2024-06-30 ENCOUNTER — Ambulatory Visit

## 2024-06-30 DIAGNOSIS — I639 Cerebral infarction, unspecified: Secondary | ICD-10-CM

## 2024-06-30 LAB — CUP PACEART REMOTE DEVICE CHECK
Date Time Interrogation Session: 20250731232909
Implantable Pulse Generator Implant Date: 20240514

## 2024-07-04 ENCOUNTER — Encounter: Payer: Self-pay | Admitting: Adult Health

## 2024-07-05 ENCOUNTER — Ambulatory Visit: Admitting: Cardiovascular Disease

## 2024-07-06 ENCOUNTER — Encounter

## 2024-07-06 ENCOUNTER — Ambulatory Visit: Admitting: Internal Medicine

## 2024-07-11 ENCOUNTER — Ambulatory Visit: Payer: Self-pay | Admitting: Cardiovascular Disease

## 2024-07-13 NOTE — Telephone Encounter (Signed)
 Spoke w/Pt regarding medication dosage of Austedo  21 mg not available. Pt voiced understanding and asked what's the highest dose provided he can get that is not XR. He has a grant that pays for his medication and he doesn't want to lose it, however he stated that the 12 mg BID is not effective for him at this time. Informed Pt will send message to Dr. Rosemarie for recommendations regarding dosage. Pt voiced understanding and thanks for the call.

## 2024-07-17 ENCOUNTER — Telehealth: Payer: Self-pay | Admitting: Neurology

## 2024-07-17 ENCOUNTER — Other Ambulatory Visit: Payer: Self-pay

## 2024-07-17 MED ORDER — AUSTEDO 12 MG PO TABS
12.0000 mg | ORAL_TABLET | Freq: Every morning | ORAL | 2 refills | Status: DC
Start: 2024-07-17 — End: 2024-08-24

## 2024-07-17 MED ORDER — DEUTETRABENAZINE ER 18 MG PO TB24: 18.0000 mg | ORAL_TABLET | Freq: Every evening | ORAL | 1 refills | Status: DC

## 2024-07-17 NOTE — Telephone Encounter (Signed)
 Cld pharmacy and spoke w/Tiffany. She is clarifying if Pt to stay on Austedo  12 mg BID as well as the 18 mg in the evening. Informed Tiffany the provider has recommended Austedo  12 mg one tab in the morning and 18 mg one tab in the evening. Tiffany voiced understanding and thanks for the call.  Spoke w/Pt to be sure he understands the change in his medication dosing. Informed him of the Austedo  12 mg one tab in the morning and 18 mg one tab in the evening. Pt voiced understanding. Also asked Pt to let us  know if the new dose is working for him after he has taken it for several days. Pt voiced understanding and thanks for the follow up.

## 2024-07-17 NOTE — Telephone Encounter (Signed)
 Pharmacy called to follow up on clarity about Pt medication order that  was sent over . Pharmacy states they wasn't sure how Pt should take medication because there is 2 different order and they would like clarification on how Pt should take medication  , Also Pharmacy may need New Rx for Pt medication   Callback is [phone]

## 2024-07-26 DIAGNOSIS — H5203 Hypermetropia, bilateral: Secondary | ICD-10-CM | POA: Diagnosis not present

## 2024-07-27 ENCOUNTER — Other Ambulatory Visit: Payer: Self-pay | Admitting: Internal Medicine

## 2024-07-27 ENCOUNTER — Other Ambulatory Visit

## 2024-07-27 DIAGNOSIS — E890 Postprocedural hypothyroidism: Secondary | ICD-10-CM | POA: Diagnosis not present

## 2024-07-27 LAB — T4, FREE: Free T4: 1.7 ng/dL (ref 0.8–1.8)

## 2024-07-27 LAB — TSH: TSH: 1.03 m[IU]/L (ref 0.40–4.50)

## 2024-07-28 ENCOUNTER — Ambulatory Visit: Payer: Self-pay | Admitting: Internal Medicine

## 2024-07-31 ENCOUNTER — Ambulatory Visit (INDEPENDENT_AMBULATORY_CARE_PROVIDER_SITE_OTHER)

## 2024-07-31 DIAGNOSIS — I639 Cerebral infarction, unspecified: Secondary | ICD-10-CM | POA: Diagnosis not present

## 2024-08-02 LAB — CUP PACEART REMOTE DEVICE CHECK
Date Time Interrogation Session: 20250831235044
Implantable Pulse Generator Implant Date: 20240514

## 2024-08-03 ENCOUNTER — Other Ambulatory Visit: Payer: Self-pay

## 2024-08-03 ENCOUNTER — Ambulatory Visit: Payer: Self-pay | Admitting: Cardiovascular Disease

## 2024-08-03 MED ORDER — LEVOTHYROXINE SODIUM 150 MCG PO TABS: 150.0000 ug | ORAL_TABLET | Freq: Every day | ORAL | 6 refills | Status: AC

## 2024-08-07 ENCOUNTER — Encounter

## 2024-08-08 NOTE — Progress Notes (Signed)
 Remote Loop Recorder Transmission

## 2024-08-12 ENCOUNTER — Other Ambulatory Visit: Payer: Self-pay | Admitting: Neurology

## 2024-08-23 ENCOUNTER — Ambulatory Visit: Admitting: Cardiovascular Disease

## 2024-08-24 ENCOUNTER — Encounter: Payer: Self-pay | Admitting: Adult Health

## 2024-08-24 ENCOUNTER — Ambulatory Visit: Admitting: Adult Health

## 2024-08-24 VITALS — BP 148/59 | HR 57 | Ht 72.0 in | Wt 271.0 lb

## 2024-08-24 DIAGNOSIS — G2401 Drug induced subacute dyskinesia: Secondary | ICD-10-CM

## 2024-08-24 DIAGNOSIS — Z8673 Personal history of transient ischemic attack (TIA), and cerebral infarction without residual deficits: Secondary | ICD-10-CM | POA: Diagnosis not present

## 2024-08-24 DIAGNOSIS — I639 Cerebral infarction, unspecified: Secondary | ICD-10-CM

## 2024-08-24 DIAGNOSIS — G244 Idiopathic orofacial dystonia: Secondary | ICD-10-CM

## 2024-08-24 DIAGNOSIS — I6523 Occlusion and stenosis of bilateral carotid arteries: Secondary | ICD-10-CM | POA: Diagnosis not present

## 2024-08-24 DIAGNOSIS — R259 Unspecified abnormal involuntary movements: Secondary | ICD-10-CM

## 2024-08-24 MED ORDER — AUSTEDO XR 36 MG PO TB24: 36.0000 mg | ORAL_TABLET | Freq: Every day | ORAL | 11 refills | Status: DC

## 2024-08-24 NOTE — Patient Instructions (Addendum)
 Your Plan:  Increase Austedo  dosage - recommend taking Austedo  XR 36mg  daily - this will replace both the 12mg  AM and 18mg  dosing  Referral will be placed to a movement clinic specialist for further evaluation      Follow up in 3-4 months or call earlier if needed       Thank you for coming to see us  at [facility]. I hope we have been able to provide you high quality care today.  You may receive a patient satisfaction survey over the next few weeks. We would appreciate your feedback and comments so that we may continue to improve ourselves and the health of our patients.

## 2024-08-24 NOTE — Progress Notes (Signed)
 [facility] [address] [place]. [place] [address] [phone]       OFFICE FOLLOW UP NOTE  [name] Date of Birth:  1956/05/24 Medical Record Number:  [id number]   Primary neurologist: Dr. Rosemarie Reason for visit: Stroke follow-up   Chief Complaint  Patient presents with   Follow-up    Pt in 8 alone Pt here for stroke f/u Pt states Austedo   is not working       HPI:   Update 08/24/2024 JM: Patient returns for follow-up visit after prior visit with Dr. Rosemarie 4 months ago where Austedo  dose was increased to 12 mg twice daily and gradually tapered off clonazepam  due to no benefit and side effects.  Patient contacted office in August requesting dosage increase as recommended by PCP and dose adjusted to 12 mg a.m. and 18 mg p.m.  He has remained on Austedo  12 mg a.m. and 18mg  (XR) PM. He reports he is tolerating well but has not noticed any significant improvement but also declines progression.  Involuntary movements primarily in face/mouth and upper extremities, can occasionally affect lower extremities. He has noticed some improvement of movement in lower extremities prior to sleep, does not feel movements persist during his sleep. He has noticed more recently, he has sensation where he needs to clear his throat every 5-10 seconds which can last several hours at a time. Denies dysphagia, dysarthria or dysphagia.        History provided for reference purposes only Update 04/11/2024 Dr. Rosemarie: He returns for follow-up after last visit 6 weeks ago.  He has started Austedo  6 mg twice daily which is tolerating well without side effects but has not noticed significant improvement in his involuntary facial or hand movements.  He takes Klonopin  0.5 mg 2 tablets at bedtime and feels it is not helping as well and actually makes him feel tired and wants to come off it.  He was found to have significant thyroid  dysfunction and saw endocrinologist Dr. Trixie who has  adjusted his thyroid  medications and he is feeling much better.  He is not having recurrent stroke or TIA symptoms.  He remains on aspirin  which is tolerating well without side effects.   Update 03/01/2024 Dr. Rosemarie: He returns for follow-up after last visit with me a year ago.  Patient has noticed involuntary movements involving his face for the last 3 to 4 months and they seem to have now spread to involve both hands and feet.  These are constant.  He feels like he is fidgeting his hands and feet.  Feels restless.  He was seen by Harlene nurse practitioner at last visit and started on Klonopin  which she feels has helped somewhat but not enough.  He has been having difficult time sleeping as well.  He tried Ambien did not work.  He tried trazodone as well which has not helped.  He has history of longstanding anxiety and was on Xanax  for years but it was discontinued.  He also had a history of heavy alcohol intake about 9 beers per day for 25 years and stopped that as well last year.  His involuntary movements actually began after stopping his alcohol and Xanax .  He has been started on Pristiq 50 mg daily by his PCP for anxiety and feels it is helping.  He has also been referred to psychiatry but has not heard about appointment from them yet.  He denies any family history of Huntington's disease.  He denies any  significant memory loss or cognitive impairment.  He had CT angiogram of the brain and neck done on 02/17/2024 but the official report is not yet back and I have looked at it personally I did not see any acute abnormality.  There may be some moderate narrowing at the origin of the right vertebral artery but all intracranial vessels and carotid bifurcations appear patent.  He has not had any recurrent stroke or TIA symptoms.  Remains on aspirin  she is tolerating well without bruising or bleeding.  States his blood pressure is under good control.   Update 01/12/2024 JM: Patient returns for 69-month follow-up  visit.  His main concern today is in regards to involuntary movements that started about 4 to 5 months ago, initially present in his jaw and gradually started affecting his legs and now affecting his arms.  He has had difficulty eating due to jaw movement and pain as well as affecting his ability to speak, leg movements can become painful especially at night with a severe cramping sensation.  Reports jaw movements disappear during sleep.  Involuntary movements are present all day long but usually overall mild during the day.  Denies any weakness or changes in his gait, does have chronic gait impairment and imbalance after his stroke in 10/2022 but this has been stable.  He was having slight upper extremity tremor initially but this seems to have subsided.  Unsure family history of similar symptoms as he was adopted.  He was started on Pristiq about 3 months ago which has helped reduce panic attacks but continues to struggle with depression/anxiety.  He also continues to struggle with insomnia, use of Ambien with some benefit. He was previously on Xanax  6mg  daily for multiple years but this was stopped back in 05/2023 after ED visit for altered mental status.  He also reports complete EtOH cessation since that time.  He was previously followed by psychiatry but stopped after Xanax  was discontinued.  Denies starting any new medications around the time of onset.  Overall stable from stroke standpoint.  Denies any current memory concerns, feels brain fog has improved.  Remains on aspirin  and Crestor.  Routinely follows with PCP and cardiology for stroke risk factor management.  Loop recorder has not shown atrial fibrillation thus far.  Previously discussed follow-up with [facility] for untreated sleep apnea but feels he no longer has issues with apnea since he stopped EtOH use.  Update 07/06/2023 JM: Patient returns for follow-up visit unaccompanied.  Overall stable without new stroke/TIA symptoms.  Reports residual  imbalance, denies much change since prior visit.  Was working with PT/OT back in May/June, is scheduled 8/14 for PT reevaluation. Does need to use door frames and wall to ambulate, does have RW at home but does not routinely use, denies any recent falls.  Also continued short-term memory issues, denies any worsening since prior visit.  Remains on Crestor without side effects. Reports stopping aspirin  and being placed on Eliquis but ran out of prescription yesterday.  Patient unsure why he was placed on Eliquis, reports was started after hospital stay in June (see below) but per note review, no mention of Eliquis.  Cardiac monitor negative for A-fib, loop recorder placed on 5/14 without evidence of A-fib thus far. Routinely follows with PCP for stroke risk factor management.  Evaluated at [facility] ED on 6/30 with altered mental status.  Per ED note, patient confused with delusional statements.  Reported last drink 3 weeks ago however friend stated last drink  only 3 days PTA, prior to alcohol cessation patient drinking 9-10 beers daily.  Extensive workup largely unremarkable.  Placed on Librium taper with significant for mild symptoms.  He was evaluated by psychiatry who noted long-term use of Xanax  recently discontinued which may also be contributing.  Xanax  restarted and Librium discontinued.  CT head negative for acute findings.  Felt symptoms likely in setting of alcohol and benzo withdrawal. Was d/c'd to SNF rehab, returned back home last week.  Reports complete EtOH cessation since discharge, reports drinking excessive alcohol (9-10 beers daily) over the past 30 years.  Denies any tobacco or drug use.  Consult visit 02/18/2023 Dr. Rosemarie: Mr. Wingard is a 68 year old Caucasian male seen today for initial office consultation visit for stroke.  History is obtained from the patient and review of collateral notes and electronic medical records.  I have personally reviewed pertinent available imaging films in  PACS.  He has past medical history of hypertension, hyperlipidemia, obstructive sleep apnea, depression, COPD, lung adenocarcinoma, gastroesophageal reflux disease, gout, thyroid  cancer.  Patient states for the last 6 months or so his benign send gait and balance difficulties.  He states this began suddenly 1 day when he woke up.  Trouble getting out of bed and he fell down.  He was told that he was walking like a drunk.  He did not seek medical help at that time.  The last few months he feels his gait and balance are improving.  He underwent MRI scan of the brain on 11/07/2022 which showed a 1 cm right cerebellar cortical and white matter acute infarct as well as remote age bilateral basal ganglia and corona radiata lacunar infarcts.  There is mild changes of small vessel disease.  Patient started on aspirin  for stroke prevention since which is tolerating well without bruising or bleeding.  He is also on Crestor tolerating well.  His blood pressure is well-controlled today it is 130/67.  He states that he never been diagnosed with sleep apnea though I find the diagnosis listed past medical history.  He has had no further strokelike symptoms.  Denies any history of syncopal events, palpitations or atrial fibrillation.  He does admit to mild short-term memory and cognitive difficulties.  He lives alone and manages a normal.  Echocardiogram on 11/2522 showed ejection fraction of 65%.  There is mild left atrial dilatation.  Carotid ultrasound showed no significant extracranial stenosis bilaterally.  4 weeks cardiac external monitor showed no evidence of atrial fibrillation with brief runs of supraventricular tachycardia and isolated supraventricular ectopics.  ROS:   14 system review of systems is positive for those listed in HPI and all other systems negative  PMH:  Past Medical History:  Diagnosis Date   Adenocarcinoma, lung, left (HCC) 07/05/2017   Age-related osteoporosis without current pathological  fracture 12/12/2021   Agoraphobia, unspecified 06/05/2023   Alcohol dependence with alcohol-induced psychotic disorder with hallucinations (HCC) 06/05/2023   Altered mental status, unspecified 06/05/2023   Anxiety    Cerebral infarction, unspecified (HCC) 06/05/2023   Cerebral ischemia 06/05/2023   COPD (chronic obstructive pulmonary disease) (HCC)    Depression    Dry eye syndrome    Dyslipidemia 01/05/2023   Dysrhythmia    hx of PAC's   Effusion of elbow joint, right 06/29/2017   Gastric ulcer with perforation but without obstruction (HCC) 04/15/2016   GERD (gastroesophageal reflux disease)    Gout    HTN (hypertension), benign 02/18/2023   Hyperlipidemia    Hypertension  Hypothyroidism    Hypothyroidism, unspecified 06/05/2023   Malignant neoplasm of thyroid  gland (HCC) 06/05/2023   Muscle wasting and atrophy, not elsewhere classified, unspecified site 06/07/2023   Muscle wasting and atrophy, not elsewhere classified, unspecified site 06/07/2023   Nodule of left lung 02/18/2023   Non-restorative sleep 02/18/2023   OSA (obstructive sleep apnea) 02/18/2023   Papillary thyroid  carcinoma s/p total thyroidectomy & CLND 08/27/2017 08/24/2017   Penetrating ulcer of aorta 02/18/2023   Postsurgical hypothyroidism 10/01/2017   Protrusion of intervertebral disc of thoracic region 02/18/2023   Seborrheic dermatitis 02/18/2023   Thyroid  cancer (HCC)    2018   Wheezing 06/05/2023    Social History:  Social History   Socioeconomic History   Marital status: Single    Spouse name: Not on file   Number of children: Not on file   Years of education: Not on file   Highest education level: Not on file  Occupational History   Not on file  Tobacco Use   Smoking status: Former    Current packs/day: 0.00    Average packs/day: 1 pack/day for 40.0 years (40.0 ttl pk-yrs)    Types: Cigarettes    Start date: 05/1979    Quit date: 05/2019    Years since quitting: 5.3   Smokeless  tobacco: Never  Vaping Use   Vaping status: Never Used  Substance and Sexual Activity   Alcohol use: Not Currently    Alcohol/week: 0.0 standard drinks of alcohol    Comment: Used to drink a lot -moderate - quit 2020   Drug use: Not Currently    Types: Marijuana    Comment: last use years ago   Sexual activity: Not Currently  Other Topics Concern   Not on file  Social History Narrative   Pt lives alone    Retired    Social Drivers of Corporate investment banker Strain: Not on file  Food Insecurity: Not on file  Transportation Needs: Not on file  Physical Activity: Not on file  Stress: Not on file  Social Connections: Unknown (03/30/2022)   Received from [employer]   Social Network    Social Network: Not on file  Intimate Partner Violence: Unknown (03/06/2022)   Received from [facility]   HITS    Physically Hurt: Not on file    Insult or Talk Down To: Not on file    Threaten Physical Harm: Not on file    Scream or Curse: Not on file    Medications:   Current Outpatient Medications on File Prior to Visit  Medication Sig Dispense Refill   alendronate  (FOSAMAX ) 70 MG tablet TAKE 1 TABLET EVERY 7 DAYS WITH A FULL GLASS OF WATER ON AN EMPTY STOMACH. 12 tablet 3   aspirin  EC 81 MG tablet Take 1 tablet (81 mg total) by mouth daily. Swallow whole.     atenolol  (TENORMIN ) 100 MG tablet Take 100 mg by mouth 2 (two) times daily.     AUSTEDO  XR 18 MG TB24 TAKE 1 TABLET BY MOUTH EVERY EVENING 30 tablet 1   CALCIUM  PO Take 1 tablet by mouth daily.     Cholecalciferol (VITAMIN D ) 2000 units tablet Take 2,000 Units by mouth daily.     desvenlafaxine (PRISTIQ) 50 MG 24 hr tablet Take 50 mg by mouth daily.     Deutetrabenazine  (AUSTEDO ) 12 MG TABS Take 1 tablet (12 mg total) by mouth in the morning. 30 tablet 2   fenofibrate 160 MG tablet Take  160 mg by mouth daily with supper.      hydrocortisone 2.5 % cream Apply 1 application  topically 2 (two) times daily as needed (rash).      levothyroxine  (SYNTHROID ) 150 MCG tablet Take 1 tablet (150 mcg total) by mouth daily. 45 tablet 6   Multiple Vitamin (MULTIVITAMIN WITH MINERALS) TABS tablet Take 1 tablet by mouth daily.     omeprazole (PRILOSEC) 20 MG capsule Take 20 mg by mouth daily before breakfast.      rosuvastatin (CRESTOR) 10 MG tablet Take 10 mg by mouth daily.      zolpidem (AMBIEN) 10 MG tablet Take 10 mg by mouth at bedtime as needed.     No current facility-administered medications on file prior to visit.    Allergies:   Allergies  Allergen Reactions   Lorazepam Anxiety and Hypertension    Paranoid      Physical Exam Today's Vitals   08/24/24 1516  BP: (!) 148/59  Pulse: (!) 57  Weight: 271 lb (122.9 kg)  Height: 6' (1.829 m)    Body mass index is 36.75 kg/m.  General: Very pleasant obese unkempt middle-aged Caucasian male, seated, in no evident distress  Neurologic Exam Mental Status: Awake and fully alert. Oriented to place and time. Recent memory mildly impaired and remote memory intact. Attention span, concentration and fund of knowledge mostly appropriate. Mood and affect appropriate.  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  Extraocular movements full without nystagmus. Visual fields full to confrontation in the left eye.SABRA Hearing intact. Facial sensation intact. Face, tongue, palate moves normally and symmetrically.  Motor: Normal bulk and tone. Normal strength in all tested extremity muscles.  Consistent involuntary mild movements of jaw, head, twisting of tongue and persistent hand movements (twisting, piano playing). Intermittent tapping foot movements bilaterally.  Persistently clearing his throat during visit.  No evidence of resting or action tremor.  No cogwheel rigidity. Sensory.: intact to touch , pinprick , position and vibratory sensation.  Coordination: Rapid alternating movements normal in all extremities. Finger-to-nose and  heel-to-shin performed accurately bilaterally.   Gait and Station: Arises from chair without difficulty. Stance is normal. Gait is slightly broad-based demonstrates with moderate unsteadiness, adequate stride length although decreased step height.  Greater difficulty with turns. No use of AD.  Tandem walk and heel toe not attempted. Reflexes: 1+ and symmetric. Toes downgoing.        ASSESSMENT: 68 year old Caucasian male with subacute balance and gait difficulties with onset around 07/2022 with abnormal MRI scan showing a subacute right cerebellar infarct in December 2023 s/p ILR as well as evidence of left basal ganglia, right corona radiata and lentiform chronic infarcts.  Vascular risk factors of obesity, hyperlipidemia, hypertension, at risk for obstructive sleep apnea, EtOH abuse and silent cerebral infarcts.  He also has mild cognitive impairment.  New involuntary orofacial dyskinesia starting around 08/2023 and gradually started affecting his hands and feet raising concern for Huntington's and was started on Austedo  in 02/2024.     PLAN:  1.  Dyskinesias  - Increase Austedo  dosage - will send in new rx for XR 36mg  dosage (currently taking IR 12mg  AM and XR 18mg  PM). Advised to call after 4 weeks if no benefit or sooner if difficulty tolerating.  -if no benefit, will consider resubmitting approval for [place]   - No benefit with clonazepam   -referral placed to [facility] movement [facility] clinic for further evaluation and recommendations  - Labs for  reversible causes benign  -HTT genetic test result negative  -does have hx of longstanding anxiety and on Xanax  for many years, symptoms started shortly after discontinuing Xanax  in 05/2023. Has hx of longstanding alcohol use with cessation since 05/2023  2. Right cerebellar infarct 3. Hx of chronic strokes on imaging 4. R VA occlusion  -Continued imbalance and gait unsteadiness - overall stable -CTA head/neck 02/2024 R VA moderate to severe  stenosis, left ICA 50 to 60% stenosis -CTA head/neck 03/2023 showed right VA occlusion possibly responsible for stroke, also 50% narrowing of left ICA.   -Continue aspirin  and Crestor for secondary stroke prevention measures managed by PCP  -Continue close PCP follow-up for aggressive stroke risk factor management     Follow-up in 3-4 months or call earlier if needed    I personally spent a total of 42 minutes in the care of the patient today including preparing to see the patient, getting/reviewing separately obtained history, performing a medically appropriate exam/evaluation, counseling and educating, placing orders, and documenting clinical information in the EHR.   Harlene Bogaert, AGNP-BC  [facility] [address] [place], [place] [address]  Phone [phone] Fax [phone] Note: This document was prepared with digital dictation and possible smart phrase technology. Any transcriptional errors that result from this process are unintentional.

## 2024-08-25 ENCOUNTER — Telehealth: Payer: Self-pay | Admitting: Adult Health

## 2024-08-25 NOTE — Telephone Encounter (Signed)
 Referral to Neurology faxed to [facility]  [facility] Phone:[phone] Fax:[phone]

## 2024-08-28 NOTE — Progress Notes (Signed)
 Remote Loop Recorder Transmission

## 2024-08-29 DIAGNOSIS — K219 Gastro-esophageal reflux disease without esophagitis: Secondary | ICD-10-CM | POA: Diagnosis not present

## 2024-08-29 DIAGNOSIS — S9304XA Dislocation of right ankle joint, initial encounter: Secondary | ICD-10-CM | POA: Diagnosis not present

## 2024-08-29 DIAGNOSIS — E66812 Obesity, class 2: Secondary | ICD-10-CM | POA: Diagnosis not present

## 2024-08-29 DIAGNOSIS — S93431A Sprain of tibiofibular ligament of right ankle, initial encounter: Secondary | ICD-10-CM | POA: Diagnosis not present

## 2024-08-29 DIAGNOSIS — E7849 Other hyperlipidemia: Secondary | ICD-10-CM | POA: Diagnosis not present

## 2024-08-29 DIAGNOSIS — I10 Essential (primary) hypertension: Secondary | ICD-10-CM | POA: Diagnosis not present

## 2024-08-29 DIAGNOSIS — S82841A Displaced bimalleolar fracture of right lower leg, initial encounter for closed fracture: Secondary | ICD-10-CM | POA: Diagnosis not present

## 2024-08-29 DIAGNOSIS — R262 Difficulty in walking, not elsewhere classified: Secondary | ICD-10-CM | POA: Diagnosis not present

## 2024-08-29 DIAGNOSIS — F1090 Alcohol use, unspecified, uncomplicated: Secondary | ICD-10-CM | POA: Diagnosis not present

## 2024-08-29 DIAGNOSIS — E039 Hypothyroidism, unspecified: Secondary | ICD-10-CM | POA: Diagnosis not present

## 2024-08-29 DIAGNOSIS — I639 Cerebral infarction, unspecified: Secondary | ICD-10-CM | POA: Diagnosis not present

## 2024-08-29 DIAGNOSIS — Z743 Need for continuous supervision: Secondary | ICD-10-CM | POA: Diagnosis not present

## 2024-08-29 DIAGNOSIS — S52615S Nondisplaced fracture of left ulna styloid process, sequela: Secondary | ICD-10-CM | POA: Diagnosis not present

## 2024-08-29 DIAGNOSIS — Z6839 Body mass index (BMI) 39.0-39.9, adult: Secondary | ICD-10-CM | POA: Diagnosis not present

## 2024-08-29 DIAGNOSIS — Z4789 Encounter for other orthopedic aftercare: Secondary | ICD-10-CM | POA: Diagnosis not present

## 2024-08-29 DIAGNOSIS — R58 Hemorrhage, not elsewhere classified: Secondary | ICD-10-CM | POA: Diagnosis not present

## 2024-08-29 DIAGNOSIS — R609 Edema, unspecified: Secondary | ICD-10-CM | POA: Diagnosis not present

## 2024-08-29 DIAGNOSIS — S82891S Other fracture of right lower leg, sequela: Secondary | ICD-10-CM | POA: Diagnosis not present

## 2024-08-29 DIAGNOSIS — S82891A Other fracture of right lower leg, initial encounter for closed fracture: Secondary | ICD-10-CM | POA: Diagnosis not present

## 2024-08-29 DIAGNOSIS — R531 Weakness: Secondary | ICD-10-CM | POA: Diagnosis not present

## 2024-08-29 DIAGNOSIS — J449 Chronic obstructive pulmonary disease, unspecified: Secondary | ICD-10-CM | POA: Diagnosis not present

## 2024-08-29 DIAGNOSIS — S61212A Laceration without foreign body of right middle finger without damage to nail, initial encounter: Secondary | ICD-10-CM | POA: Diagnosis not present

## 2024-08-29 DIAGNOSIS — F32A Depression, unspecified: Secondary | ICD-10-CM | POA: Diagnosis not present

## 2024-08-29 DIAGNOSIS — S61412S Laceration without foreign body of left hand, sequela: Secondary | ICD-10-CM | POA: Diagnosis not present

## 2024-08-29 DIAGNOSIS — S52612A Displaced fracture of left ulna styloid process, initial encounter for closed fracture: Secondary | ICD-10-CM | POA: Diagnosis not present

## 2024-08-29 DIAGNOSIS — E038 Other specified hypothyroidism: Secondary | ICD-10-CM | POA: Diagnosis not present

## 2024-08-29 DIAGNOSIS — E785 Hyperlipidemia, unspecified: Secondary | ICD-10-CM | POA: Diagnosis not present

## 2024-08-29 DIAGNOSIS — M6281 Muscle weakness (generalized): Secondary | ICD-10-CM | POA: Diagnosis not present

## 2024-08-30 DIAGNOSIS — Z8673 Personal history of transient ischemic attack (TIA), and cerebral infarction without residual deficits: Secondary | ICD-10-CM | POA: Insufficient documentation

## 2024-08-30 DIAGNOSIS — K219 Gastro-esophageal reflux disease without esophagitis: Secondary | ICD-10-CM | POA: Diagnosis not present

## 2024-08-30 DIAGNOSIS — S82841A Displaced bimalleolar fracture of right lower leg, initial encounter for closed fracture: Secondary | ICD-10-CM | POA: Diagnosis not present

## 2024-08-30 DIAGNOSIS — S82891A Other fracture of right lower leg, initial encounter for closed fracture: Secondary | ICD-10-CM | POA: Diagnosis not present

## 2024-08-30 DIAGNOSIS — E7849 Other hyperlipidemia: Secondary | ICD-10-CM | POA: Diagnosis not present

## 2024-08-30 DIAGNOSIS — E038 Other specified hypothyroidism: Secondary | ICD-10-CM | POA: Diagnosis not present

## 2024-08-30 DIAGNOSIS — I10 Essential (primary) hypertension: Secondary | ICD-10-CM | POA: Diagnosis not present

## 2024-08-30 DIAGNOSIS — F1090 Alcohol use, unspecified, uncomplicated: Secondary | ICD-10-CM | POA: Diagnosis not present

## 2024-08-30 DIAGNOSIS — J449 Chronic obstructive pulmonary disease, unspecified: Secondary | ICD-10-CM | POA: Diagnosis not present

## 2024-08-31 ENCOUNTER — Ambulatory Visit

## 2024-08-31 DIAGNOSIS — S82891A Other fracture of right lower leg, initial encounter for closed fracture: Secondary | ICD-10-CM | POA: Diagnosis not present

## 2024-08-31 DIAGNOSIS — I639 Cerebral infarction, unspecified: Secondary | ICD-10-CM

## 2024-08-31 LAB — CUP PACEART REMOTE DEVICE CHECK
Date Time Interrogation Session: 20251001232420
Implantable Pulse Generator Implant Date: 20240514

## 2024-08-31 NOTE — Progress Notes (Signed)
 Remote Loop Recorder Transmission

## 2024-09-01 DIAGNOSIS — S82891A Other fracture of right lower leg, initial encounter for closed fracture: Secondary | ICD-10-CM | POA: Diagnosis not present

## 2024-09-02 DIAGNOSIS — S99929A Unspecified injury of unspecified foot, initial encounter: Secondary | ICD-10-CM | POA: Diagnosis not present

## 2024-09-02 DIAGNOSIS — J449 Chronic obstructive pulmonary disease, unspecified: Secondary | ICD-10-CM | POA: Diagnosis not present

## 2024-09-02 DIAGNOSIS — F32A Depression, unspecified: Secondary | ICD-10-CM | POA: Diagnosis not present

## 2024-09-02 DIAGNOSIS — Z79899 Other long term (current) drug therapy: Secondary | ICD-10-CM | POA: Diagnosis not present

## 2024-09-02 DIAGNOSIS — S82891A Other fracture of right lower leg, initial encounter for closed fracture: Secondary | ICD-10-CM | POA: Diagnosis not present

## 2024-09-02 DIAGNOSIS — G2401 Drug induced subacute dyskinesia: Secondary | ICD-10-CM | POA: Diagnosis not present

## 2024-09-02 DIAGNOSIS — W19XXXD Unspecified fall, subsequent encounter: Secondary | ICD-10-CM | POA: Diagnosis not present

## 2024-09-02 DIAGNOSIS — Z7401 Bed confinement status: Secondary | ICD-10-CM | POA: Diagnosis not present

## 2024-09-02 DIAGNOSIS — I10 Essential (primary) hypertension: Secondary | ICD-10-CM | POA: Diagnosis not present

## 2024-09-02 DIAGNOSIS — S61213D Laceration without foreign body of left middle finger without damage to nail, subsequent encounter: Secondary | ICD-10-CM | POA: Diagnosis not present

## 2024-09-02 DIAGNOSIS — S82891G Other fracture of right lower leg, subsequent encounter for closed fracture with delayed healing: Secondary | ICD-10-CM | POA: Diagnosis not present

## 2024-09-02 DIAGNOSIS — S82891S Other fracture of right lower leg, sequela: Secondary | ICD-10-CM | POA: Diagnosis not present

## 2024-09-02 DIAGNOSIS — Z4789 Encounter for other orthopedic aftercare: Secondary | ICD-10-CM | POA: Diagnosis not present

## 2024-09-02 DIAGNOSIS — S61412S Laceration without foreign body of left hand, sequela: Secondary | ICD-10-CM | POA: Diagnosis not present

## 2024-09-02 DIAGNOSIS — M6281 Muscle weakness (generalized): Secondary | ICD-10-CM | POA: Diagnosis not present

## 2024-09-04 DIAGNOSIS — K219 Gastro-esophageal reflux disease without esophagitis: Secondary | ICD-10-CM | POA: Diagnosis not present

## 2024-09-04 DIAGNOSIS — E039 Hypothyroidism, unspecified: Secondary | ICD-10-CM | POA: Diagnosis not present

## 2024-09-04 DIAGNOSIS — J449 Chronic obstructive pulmonary disease, unspecified: Secondary | ICD-10-CM | POA: Diagnosis not present

## 2024-09-04 DIAGNOSIS — S61313D Laceration without foreign body of left middle finger with damage to nail, subsequent encounter: Secondary | ICD-10-CM | POA: Diagnosis not present

## 2024-09-04 DIAGNOSIS — S82891G Other fracture of right lower leg, subsequent encounter for closed fracture with delayed healing: Secondary | ICD-10-CM | POA: Diagnosis not present

## 2024-09-04 DIAGNOSIS — E785 Hyperlipidemia, unspecified: Secondary | ICD-10-CM | POA: Diagnosis not present

## 2024-09-06 ENCOUNTER — Ambulatory Visit: Attending: Cardiology | Admitting: Cardiovascular Disease

## 2024-09-06 DIAGNOSIS — F4321 Adjustment disorder with depressed mood: Secondary | ICD-10-CM | POA: Diagnosis not present

## 2024-09-07 ENCOUNTER — Encounter: Payer: Self-pay | Admitting: Cardiovascular Disease

## 2024-09-07 ENCOUNTER — Encounter

## 2024-09-07 DIAGNOSIS — S61313D Laceration without foreign body of left middle finger with damage to nail, subsequent encounter: Secondary | ICD-10-CM | POA: Diagnosis not present

## 2024-09-07 DIAGNOSIS — E785 Hyperlipidemia, unspecified: Secondary | ICD-10-CM | POA: Diagnosis not present

## 2024-09-07 DIAGNOSIS — E039 Hypothyroidism, unspecified: Secondary | ICD-10-CM | POA: Diagnosis not present

## 2024-09-07 DIAGNOSIS — M810 Age-related osteoporosis without current pathological fracture: Secondary | ICD-10-CM | POA: Diagnosis not present

## 2024-09-07 DIAGNOSIS — S82891G Other fracture of right lower leg, subsequent encounter for closed fracture with delayed healing: Secondary | ICD-10-CM | POA: Diagnosis not present

## 2024-09-07 DIAGNOSIS — K219 Gastro-esophageal reflux disease without esophagitis: Secondary | ICD-10-CM | POA: Diagnosis not present

## 2024-09-07 DIAGNOSIS — Z8673 Personal history of transient ischemic attack (TIA), and cerebral infarction without residual deficits: Secondary | ICD-10-CM | POA: Diagnosis not present

## 2024-09-07 DIAGNOSIS — J449 Chronic obstructive pulmonary disease, unspecified: Secondary | ICD-10-CM | POA: Diagnosis not present

## 2024-09-10 DIAGNOSIS — S61213D Laceration without foreign body of left middle finger without damage to nail, subsequent encounter: Secondary | ICD-10-CM | POA: Diagnosis not present

## 2024-09-10 DIAGNOSIS — Z79899 Other long term (current) drug therapy: Secondary | ICD-10-CM | POA: Diagnosis not present

## 2024-09-11 ENCOUNTER — Ambulatory Visit: Payer: Self-pay | Admitting: Cardiovascular Disease

## 2024-09-11 DIAGNOSIS — E785 Hyperlipidemia, unspecified: Secondary | ICD-10-CM | POA: Diagnosis not present

## 2024-09-11 DIAGNOSIS — W19XXXD Unspecified fall, subsequent encounter: Secondary | ICD-10-CM | POA: Diagnosis not present

## 2024-09-11 DIAGNOSIS — Z8673 Personal history of transient ischemic attack (TIA), and cerebral infarction without residual deficits: Secondary | ICD-10-CM | POA: Diagnosis not present

## 2024-09-11 DIAGNOSIS — M810 Age-related osteoporosis without current pathological fracture: Secondary | ICD-10-CM | POA: Diagnosis not present

## 2024-09-11 DIAGNOSIS — F419 Anxiety disorder, unspecified: Secondary | ICD-10-CM | POA: Diagnosis not present

## 2024-09-11 DIAGNOSIS — J449 Chronic obstructive pulmonary disease, unspecified: Secondary | ICD-10-CM | POA: Diagnosis not present

## 2024-09-11 DIAGNOSIS — S82891G Other fracture of right lower leg, subsequent encounter for closed fracture with delayed healing: Secondary | ICD-10-CM | POA: Diagnosis not present

## 2024-09-11 DIAGNOSIS — K219 Gastro-esophageal reflux disease without esophagitis: Secondary | ICD-10-CM | POA: Diagnosis not present

## 2024-09-14 DIAGNOSIS — S82891G Other fracture of right lower leg, subsequent encounter for closed fracture with delayed healing: Secondary | ICD-10-CM | POA: Diagnosis not present

## 2024-09-14 DIAGNOSIS — M810 Age-related osteoporosis without current pathological fracture: Secondary | ICD-10-CM | POA: Diagnosis not present

## 2024-09-14 DIAGNOSIS — W19XXXD Unspecified fall, subsequent encounter: Secondary | ICD-10-CM | POA: Diagnosis not present

## 2024-09-14 DIAGNOSIS — G2401 Drug induced subacute dyskinesia: Secondary | ICD-10-CM | POA: Diagnosis not present

## 2024-09-14 DIAGNOSIS — J449 Chronic obstructive pulmonary disease, unspecified: Secondary | ICD-10-CM | POA: Diagnosis not present

## 2024-09-20 DIAGNOSIS — I10 Essential (primary) hypertension: Secondary | ICD-10-CM | POA: Diagnosis not present

## 2024-09-20 DIAGNOSIS — G2401 Drug induced subacute dyskinesia: Secondary | ICD-10-CM | POA: Diagnosis not present

## 2024-09-20 DIAGNOSIS — K219 Gastro-esophageal reflux disease without esophagitis: Secondary | ICD-10-CM | POA: Diagnosis not present

## 2024-09-20 DIAGNOSIS — S82891G Other fracture of right lower leg, subsequent encounter for closed fracture with delayed healing: Secondary | ICD-10-CM | POA: Diagnosis not present

## 2024-09-20 DIAGNOSIS — Z79899 Other long term (current) drug therapy: Secondary | ICD-10-CM | POA: Diagnosis not present

## 2024-09-20 DIAGNOSIS — J449 Chronic obstructive pulmonary disease, unspecified: Secondary | ICD-10-CM | POA: Diagnosis not present

## 2024-09-20 DIAGNOSIS — M810 Age-related osteoporosis without current pathological fracture: Secondary | ICD-10-CM | POA: Diagnosis not present

## 2024-09-21 ENCOUNTER — Telehealth: Payer: Self-pay | Admitting: Adult Health

## 2024-09-21 NOTE — Telephone Encounter (Signed)
 Darryle, RN @ [facility] is asking that an order be faxed to them for pt's Austedo  , he has informed their staff that he takes this medication daily.  You may call [phone] (if Darryle, RN is not available you may speak with Erie, RN)the order can be faxed to [phone]

## 2024-09-22 DIAGNOSIS — M810 Age-related osteoporosis without current pathological fracture: Secondary | ICD-10-CM | POA: Diagnosis not present

## 2024-09-22 DIAGNOSIS — G2401 Drug induced subacute dyskinesia: Secondary | ICD-10-CM | POA: Diagnosis not present

## 2024-09-22 DIAGNOSIS — K219 Gastro-esophageal reflux disease without esophagitis: Secondary | ICD-10-CM | POA: Diagnosis not present

## 2024-09-22 DIAGNOSIS — I10 Essential (primary) hypertension: Secondary | ICD-10-CM | POA: Diagnosis not present

## 2024-09-22 DIAGNOSIS — S82891G Other fracture of right lower leg, subsequent encounter for closed fracture with delayed healing: Secondary | ICD-10-CM | POA: Diagnosis not present

## 2024-09-22 NOTE — Telephone Encounter (Signed)
 On Call MD received call form [facility] ( long term care facility)  requesting a clarification of this patient's AUSTEDO  dose.   I confirmed the chart entry which states 36 mg daily for treatment of involuntary movements.Dedra Gores, MD

## 2024-09-24 ENCOUNTER — Other Ambulatory Visit: Payer: Self-pay | Admitting: Internal Medicine

## 2024-09-24 DIAGNOSIS — C73 Malignant neoplasm of thyroid gland: Secondary | ICD-10-CM

## 2024-09-26 DIAGNOSIS — R262 Difficulty in walking, not elsewhere classified: Secondary | ICD-10-CM | POA: Diagnosis not present

## 2024-09-28 DIAGNOSIS — E785 Hyperlipidemia, unspecified: Secondary | ICD-10-CM | POA: Diagnosis not present

## 2024-09-28 DIAGNOSIS — E038 Other specified hypothyroidism: Secondary | ICD-10-CM | POA: Diagnosis not present

## 2024-09-28 DIAGNOSIS — J449 Chronic obstructive pulmonary disease, unspecified: Secondary | ICD-10-CM | POA: Diagnosis not present

## 2024-09-28 DIAGNOSIS — S82891A Other fracture of right lower leg, initial encounter for closed fracture: Secondary | ICD-10-CM | POA: Diagnosis not present

## 2024-09-28 DIAGNOSIS — M6281 Muscle weakness (generalized): Secondary | ICD-10-CM | POA: Diagnosis not present

## 2024-10-02 ENCOUNTER — Encounter

## 2024-10-03 DIAGNOSIS — S93431D Sprain of tibiofibular ligament of right ankle, subsequent encounter: Secondary | ICD-10-CM | POA: Diagnosis not present

## 2024-10-03 DIAGNOSIS — S82891A Other fracture of right lower leg, initial encounter for closed fracture: Secondary | ICD-10-CM | POA: Diagnosis not present

## 2024-10-05 ENCOUNTER — Ambulatory Visit: Admitting: Family Medicine

## 2024-10-06 DIAGNOSIS — E785 Hyperlipidemia, unspecified: Secondary | ICD-10-CM | POA: Diagnosis not present

## 2024-10-06 DIAGNOSIS — E038 Other specified hypothyroidism: Secondary | ICD-10-CM | POA: Diagnosis not present

## 2024-10-06 DIAGNOSIS — D519 Vitamin B12 deficiency anemia, unspecified: Secondary | ICD-10-CM | POA: Diagnosis not present

## 2024-10-06 DIAGNOSIS — E559 Vitamin D deficiency, unspecified: Secondary | ICD-10-CM | POA: Diagnosis not present

## 2024-10-06 DIAGNOSIS — E782 Mixed hyperlipidemia: Secondary | ICD-10-CM | POA: Diagnosis not present

## 2024-10-06 DIAGNOSIS — J449 Chronic obstructive pulmonary disease, unspecified: Secondary | ICD-10-CM | POA: Diagnosis not present

## 2024-10-09 ENCOUNTER — Encounter

## 2024-10-10 DIAGNOSIS — F329 Major depressive disorder, single episode, unspecified: Secondary | ICD-10-CM | POA: Diagnosis not present

## 2024-10-10 DIAGNOSIS — J449 Chronic obstructive pulmonary disease, unspecified: Secondary | ICD-10-CM | POA: Diagnosis not present

## 2024-10-10 DIAGNOSIS — F419 Anxiety disorder, unspecified: Secondary | ICD-10-CM | POA: Diagnosis not present

## 2024-10-10 DIAGNOSIS — G4733 Obstructive sleep apnea (adult) (pediatric): Secondary | ICD-10-CM | POA: Diagnosis not present

## 2024-10-10 DIAGNOSIS — I10 Essential (primary) hypertension: Secondary | ICD-10-CM | POA: Diagnosis not present

## 2024-10-10 DIAGNOSIS — K7460 Unspecified cirrhosis of liver: Secondary | ICD-10-CM | POA: Diagnosis not present

## 2024-10-10 DIAGNOSIS — F1019 Alcohol abuse with unspecified alcohol-induced disorder: Secondary | ICD-10-CM | POA: Diagnosis not present

## 2024-10-10 DIAGNOSIS — M80071D Age-related osteoporosis with current pathological fracture, right ankle and foot, subsequent encounter for fracture with routine healing: Secondary | ICD-10-CM | POA: Diagnosis not present

## 2024-10-12 ENCOUNTER — Ambulatory Visit: Attending: Cardiovascular Disease

## 2024-10-12 DIAGNOSIS — R011 Cardiac murmur, unspecified: Secondary | ICD-10-CM | POA: Diagnosis not present

## 2024-10-12 DIAGNOSIS — R012 Other cardiac sounds: Secondary | ICD-10-CM | POA: Diagnosis not present

## 2024-10-12 DIAGNOSIS — I639 Cerebral infarction, unspecified: Secondary | ICD-10-CM

## 2024-10-12 LAB — CUP PACEART REMOTE DEVICE CHECK
Date Time Interrogation Session: 20251112233431
Implantable Pulse Generator Implant Date: 20240514

## 2024-10-13 DIAGNOSIS — E782 Mixed hyperlipidemia: Secondary | ICD-10-CM | POA: Diagnosis not present

## 2024-10-13 DIAGNOSIS — E038 Other specified hypothyroidism: Secondary | ICD-10-CM | POA: Diagnosis not present

## 2024-10-13 DIAGNOSIS — Z79899 Other long term (current) drug therapy: Secondary | ICD-10-CM | POA: Diagnosis not present

## 2024-10-13 DIAGNOSIS — E559 Vitamin D deficiency, unspecified: Secondary | ICD-10-CM | POA: Diagnosis not present

## 2024-10-13 DIAGNOSIS — D519 Vitamin B12 deficiency anemia, unspecified: Secondary | ICD-10-CM | POA: Diagnosis not present

## 2024-10-13 DIAGNOSIS — R7309 Other abnormal glucose: Secondary | ICD-10-CM | POA: Diagnosis not present

## 2024-10-17 DIAGNOSIS — K7460 Unspecified cirrhosis of liver: Secondary | ICD-10-CM | POA: Diagnosis not present

## 2024-10-19 DIAGNOSIS — G4733 Obstructive sleep apnea (adult) (pediatric): Secondary | ICD-10-CM | POA: Diagnosis not present

## 2024-10-19 DIAGNOSIS — M80071D Age-related osteoporosis with current pathological fracture, right ankle and foot, subsequent encounter for fracture with routine healing: Secondary | ICD-10-CM | POA: Diagnosis not present

## 2024-10-19 DIAGNOSIS — I10 Essential (primary) hypertension: Secondary | ICD-10-CM | POA: Diagnosis not present

## 2024-10-19 DIAGNOSIS — J449 Chronic obstructive pulmonary disease, unspecified: Secondary | ICD-10-CM | POA: Diagnosis not present

## 2024-10-19 DIAGNOSIS — F1019 Alcohol abuse with unspecified alcohol-induced disorder: Secondary | ICD-10-CM | POA: Diagnosis not present

## 2024-10-19 DIAGNOSIS — F419 Anxiety disorder, unspecified: Secondary | ICD-10-CM | POA: Diagnosis not present

## 2024-10-19 DIAGNOSIS — K7460 Unspecified cirrhosis of liver: Secondary | ICD-10-CM | POA: Diagnosis not present

## 2024-10-24 DIAGNOSIS — M80071D Age-related osteoporosis with current pathological fracture, right ankle and foot, subsequent encounter for fracture with routine healing: Secondary | ICD-10-CM | POA: Diagnosis not present

## 2024-10-24 DIAGNOSIS — K7460 Unspecified cirrhosis of liver: Secondary | ICD-10-CM | POA: Diagnosis not present

## 2024-10-24 DIAGNOSIS — J449 Chronic obstructive pulmonary disease, unspecified: Secondary | ICD-10-CM | POA: Diagnosis not present

## 2024-10-24 DIAGNOSIS — I10 Essential (primary) hypertension: Secondary | ICD-10-CM | POA: Diagnosis not present

## 2024-10-24 DIAGNOSIS — F329 Major depressive disorder, single episode, unspecified: Secondary | ICD-10-CM | POA: Diagnosis not present

## 2024-10-24 DIAGNOSIS — G4733 Obstructive sleep apnea (adult) (pediatric): Secondary | ICD-10-CM | POA: Diagnosis not present

## 2024-10-24 DIAGNOSIS — F1019 Alcohol abuse with unspecified alcohol-induced disorder: Secondary | ICD-10-CM | POA: Diagnosis not present

## 2024-10-24 DIAGNOSIS — F419 Anxiety disorder, unspecified: Secondary | ICD-10-CM | POA: Diagnosis not present

## 2024-10-30 ENCOUNTER — Inpatient Hospital Stay: Admitting: Hematology & Oncology

## 2024-10-30 ENCOUNTER — Other Ambulatory Visit: Payer: Self-pay

## 2024-10-30 ENCOUNTER — Ambulatory Visit: Payer: Self-pay | Admitting: Cardiovascular Disease

## 2024-10-30 ENCOUNTER — Encounter: Payer: Self-pay | Admitting: Hematology & Oncology

## 2024-10-30 ENCOUNTER — Ambulatory Visit (HOSPITAL_BASED_OUTPATIENT_CLINIC_OR_DEPARTMENT_OTHER)
Admission: RE | Admit: 2024-10-30 | Discharge: 2024-10-30 | Disposition: A | Source: Ambulatory Visit | Attending: Hematology & Oncology | Admitting: Hematology & Oncology

## 2024-10-30 ENCOUNTER — Inpatient Hospital Stay: Attending: Hematology & Oncology

## 2024-10-30 VITALS — BP 107/56 | HR 55 | Temp 98.3°F | Resp 20 | Ht 73.0 in | Wt 259.0 lb

## 2024-10-30 DIAGNOSIS — F329 Major depressive disorder, single episode, unspecified: Secondary | ICD-10-CM | POA: Diagnosis not present

## 2024-10-30 DIAGNOSIS — J302 Other seasonal allergic rhinitis: Secondary | ICD-10-CM | POA: Diagnosis not present

## 2024-10-30 DIAGNOSIS — C3492 Malignant neoplasm of unspecified part of left bronchus or lung: Secondary | ICD-10-CM | POA: Insufficient documentation

## 2024-10-30 DIAGNOSIS — F1019 Alcohol abuse with unspecified alcohol-induced disorder: Secondary | ICD-10-CM | POA: Diagnosis not present

## 2024-10-30 DIAGNOSIS — M80071D Age-related osteoporosis with current pathological fracture, right ankle and foot, subsequent encounter for fracture with routine healing: Secondary | ICD-10-CM | POA: Diagnosis not present

## 2024-10-30 DIAGNOSIS — I10 Essential (primary) hypertension: Secondary | ICD-10-CM | POA: Diagnosis not present

## 2024-10-30 DIAGNOSIS — C3412 Malignant neoplasm of upper lobe, left bronchus or lung: Secondary | ICD-10-CM | POA: Diagnosis present

## 2024-10-30 DIAGNOSIS — F419 Anxiety disorder, unspecified: Secondary | ICD-10-CM | POA: Diagnosis not present

## 2024-10-30 DIAGNOSIS — G4733 Obstructive sleep apnea (adult) (pediatric): Secondary | ICD-10-CM | POA: Diagnosis not present

## 2024-10-30 DIAGNOSIS — Z923 Personal history of irradiation: Secondary | ICD-10-CM | POA: Diagnosis not present

## 2024-10-30 DIAGNOSIS — S22009A Unspecified fracture of unspecified thoracic vertebra, initial encounter for closed fracture: Secondary | ICD-10-CM | POA: Diagnosis not present

## 2024-10-30 DIAGNOSIS — J449 Chronic obstructive pulmonary disease, unspecified: Secondary | ICD-10-CM | POA: Diagnosis not present

## 2024-10-30 DIAGNOSIS — K7460 Unspecified cirrhosis of liver: Secondary | ICD-10-CM | POA: Diagnosis not present

## 2024-10-30 LAB — CMP (CANCER CENTER ONLY)
ALT: 13 U/L (ref 0–44)
AST: 21 U/L (ref 15–41)
Albumin: 4.5 g/dL (ref 3.5–5.0)
Alkaline Phosphatase: 39 U/L (ref 38–126)
Anion gap: 11 (ref 5–15)
BUN: 17 mg/dL (ref 8–23)
CO2: 27 mmol/L (ref 22–32)
Calcium: 9.1 mg/dL (ref 8.9–10.3)
Chloride: 105 mmol/L (ref 98–111)
Creatinine: 1.07 mg/dL (ref 0.61–1.24)
GFR, Estimated: >60 mL/min (ref >60)
Glucose, Bld: 103 mg/dL — ABNORMAL HIGH (ref 70–99)
Potassium: 5.2 mmol/L — ABNORMAL HIGH (ref 3.5–5.1)
Sodium: 142 mmol/L (ref 135–145)
Total Bilirubin: 0.4 mg/dL (ref 0.0–1.2)
Total Protein: 7.2 g/dL (ref 6.5–8.1)

## 2024-10-30 LAB — CBC WITH DIFFERENTIAL (CANCER CENTER ONLY)
Abs Immature Granulocytes: 0.03 K/uL (ref 0.00–0.07)
Basophils Absolute: 0.1 K/uL (ref 0.0–0.1)
Basophils Relative: 1 %
Eosinophils Absolute: 0.2 K/uL (ref 0.0–0.5)
Eosinophils Relative: 2 %
HCT: 43.1 % (ref 39.0–52.0)
Hemoglobin: 13.7 g/dL (ref 13.0–17.0)
Immature Granulocytes: 0 %
Lymphocytes Relative: 22 %
Lymphs Abs: 1.8 K/uL (ref 0.7–4.0)
MCH: 29.4 pg (ref 26.0–34.0)
MCHC: 31.8 g/dL (ref 30.0–36.0)
MCV: 92.5 fL (ref 80.0–100.0)
Monocytes Absolute: 0.5 K/uL (ref 0.1–1.0)
Monocytes Relative: 6 %
Neutro Abs: 5.8 K/uL (ref 1.7–7.7)
Neutrophils Relative %: 69 %
Platelet Count: 295 K/uL (ref 150–400)
RBC: 4.66 MIL/uL (ref 4.22–5.81)
RDW: 12.2 % (ref 11.5–15.5)
WBC Count: 8.5 K/uL (ref 4.0–10.5)
nRBC: 0 % (ref 0.0–0.2)

## 2024-10-30 NOTE — Progress Notes (Signed)
 [name]  Timothy Conway [id number] [birth year] 68 y.o. 10/30/2024   Principle Diagnosis:  Stage Ia (T1N0M0) adenocarcinoma of the left upper lung  Current Therapy:   Status post radiosurgery-5400 rad that was completed in 08/20/2020     Interim History:  Timothy Conway is back for follow-up.  Unfortunately, he fractured his right ankle.  This happened a couple months ago.  He needed surgery for this.  He has a plate in his ankle.  He is getting around with a walking boot right now.  Hopefully this will be able to come off soon.  Otherwise, he has been doing okay.  He has been in assisted living.  Hopefully, he will be able to go home.  We did do a chest x-ray on him today.  There is nothing on the chest x-ray that looks like active malignancy..  He has had no cough or shortness of breath.  He has had no change in bowel or bladder habits.  He has had no rashes.  There is been no leg swelling.  Overall, I will have to say that his performance status is ECOG 1.     Medications:  Current Outpatient Medications:    alendronate  (FOSAMAX ) 70 MG tablet, TAKE 1 TABLET EVERY 7 DAYS WITH A FULL GLASS OF WATER ON AN EMPTY STOMACH., Disp: 12 tablet, Rfl: 1   aspirin  EC 81 MG tablet, Take 1 tablet (81 mg total) by mouth daily. Swallow whole., Disp: , Rfl:    atenolol  (TENORMIN ) 100 MG tablet, Take 100 mg by mouth 2 (two) times daily., Disp: , Rfl:    CALCIUM  PO, Take 1 tablet by mouth daily., Disp: , Rfl:    Cholecalciferol (VITAMIN D ) 2000 units tablet, Take 2,000 Units by mouth daily., Disp: , Rfl:    desvenlafaxine (PRISTIQ) 50 MG 24 hr tablet, Take 50 mg by mouth daily., Disp: , Rfl:    Deutetrabenazine  ER (AUSTEDO  XR) 36 MG TB24, Take 36 mg by mouth daily., Disp: 30 tablet, Rfl: 11   fenofibrate 160 MG tablet, Take 160 mg by mouth daily with supper. , Disp: , Rfl:    hydrocortisone 2.5 % cream, Apply 1 application  topically 2 (two) times daily as needed (rash).,  Disp: , Rfl:    levothyroxine  (SYNTHROID ) 150 MCG tablet, Take 1 tablet (150 mcg total) by mouth daily., Disp: 45 tablet, Rfl: 6   Multiple Vitamin (MULTIVITAMIN WITH MINERALS) TABS tablet, Take 1 tablet by mouth daily., Disp: , Rfl:    omeprazole (PRILOSEC) 20 MG capsule, Take 20 mg by mouth daily before breakfast. , Disp: , Rfl:    rosuvastatin (CRESTOR) 10 MG tablet, Take 10 mg by mouth daily. , Disp: , Rfl:    zolpidem (AMBIEN) 10 MG tablet, Take 10 mg by mouth at bedtime as needed., Disp: , Rfl:   Allergies:  Allergies  Allergen Reactions   Lorazepam Anxiety and Hypertension    Paranoid  Other Reaction(s): paranoid    Past Medical History, Surgical history, Social history, and Family History were reviewed and updated.  Review of Systems: Review of Systems  Constitutional: Negative.   HENT:  Negative.    Eyes: Negative.   Respiratory: Negative.    Cardiovascular: Negative.   Gastrointestinal: Negative.  Negative for abdominal distention.  Endocrine: Negative.   Genitourinary: Negative.    Musculoskeletal:  Positive for gait problem.  Skin: Negative.   Neurological:  Positive for gait problem.  Hematological: Negative.   Psychiatric/Behavioral:  Negative.      Physical Exam:  height is 6' 1 (1.854 m) and weight is 259 lb (117.5 kg). His oral temperature is 98.3 F (36.8 C). His blood pressure is 107/56 (abnormal) and his pulse is 55 (abnormal). His respiration is 20 and oxygen saturation is 97%.   Wt Readings from Last 3 Encounters:  10/30/24 259 lb (117.5 kg)  08/24/24 271 lb (122.9 kg)  06/12/24 264 lb 9.6 oz (120 kg)    Physical Exam Vitals reviewed.  HENT:     Head: Normocephalic and atraumatic.  Eyes:     Pupils: Pupils are equal, round, and reactive to light.  Cardiovascular:     Rate and Rhythm: Normal rate and regular rhythm.     Heart sounds: Normal heart sounds.  Pulmonary:     Effort: Pulmonary effort is normal.     Breath sounds: Normal breath  sounds.  Abdominal:     General: Bowel sounds are normal.     Palpations: Abdomen is soft.  Musculoskeletal:        General: No tenderness or deformity. Normal range of motion.     Cervical back: Normal range of motion.     Comments: He has a walking boot on the right leg.  Lymphadenopathy:     Cervical: No cervical adenopathy.  Skin:    General: Skin is warm and dry.     Findings: No erythema or rash.  Neurological:     Mental Status: He is alert and oriented to person, place, and time.  Psychiatric:        Behavior: Behavior normal.        Thought Content: Thought content normal.        Judgment: Judgment normal.    Lab Results  Component Value Date   WBC 8.5 10/30/2024   HGB 13.7 10/30/2024   HCT 43.1 10/30/2024   MCV 92.5 10/30/2024   PLT 295 10/30/2024     Chemistry      Component Value Date/Time   NA 142 10/30/2024 1120   K 5.2 (H) 10/30/2024 1120   CL 105 10/30/2024 1120   CO2 27 10/30/2024 1120   BUN 17 10/30/2024 1120   CREATININE 1.07 10/30/2024 1120      Component Value Date/Time   CALCIUM  9.1 10/30/2024 1120   ALKPHOS 39 10/30/2024 1120   AST 21 10/30/2024 1120   ALT 13 10/30/2024 1120   BILITOT 0.4 10/30/2024 1120      Impression and Plan: Timothy Conway is a very nice 67 year old white male.  He had a non small cell lung cancer of the left upper lung.  This was an adenocarcinoma.  It was a low-grade adenocarcinoma based on the low level of activity on the PET scan.  He underwent radiosurgery for this.  He had this back in 2021.  The main problem now has been the right foot.  The ankle has been operated on.  Hopefully, he will be able to have a good quality of life once the walking boot is taken off.  I still think we can get him back in 6 months.  We will see about another chest x-ray when I see him back.    At this point, I think we probably just do chest x-rays.  We will see him back in 6 months, we will do another chest x-ray.    Maude JONELLE Crease, MD  12/1/202512:13 PM

## 2024-11-02 ENCOUNTER — Encounter

## 2024-11-09 ENCOUNTER — Encounter

## 2024-11-12 ENCOUNTER — Ambulatory Visit

## 2024-11-12 DIAGNOSIS — I639 Cerebral infarction, unspecified: Secondary | ICD-10-CM | POA: Diagnosis not present

## 2024-11-14 LAB — CUP PACEART REMOTE DEVICE CHECK
Date Time Interrogation Session: 20251213232925
Implantable Pulse Generator Implant Date: 20240514

## 2024-11-15 ENCOUNTER — Other Ambulatory Visit (HOSPITAL_COMMUNITY): Payer: Self-pay

## 2024-11-15 ENCOUNTER — Telehealth: Payer: Self-pay

## 2024-11-15 NOTE — Telephone Encounter (Signed)
 Pharmacy Patient Advocate Encounter   Received notification from Fax that prior authorization for Austedo  XR is required/requested.   Insurance verification completed.   The patient is insured through [employer].   Per test claim: PA required; PA started via CoverMyMeds. KEY [id number] . Waiting for clinical questions to populate.

## 2024-11-17 NOTE — Progress Notes (Signed)
 Remote Loop Recorder Transmission

## 2024-11-29 ENCOUNTER — Ambulatory Visit: Payer: Self-pay | Admitting: Cardiovascular Disease

## 2024-12-03 ENCOUNTER — Ambulatory Visit

## 2024-12-11 ENCOUNTER — Ambulatory Visit: Admitting: Internal Medicine

## 2024-12-11 NOTE — Progress Notes (Unsigned)
 Patient ID: Timothy Conway, male   DOB: 1956-01-05, 69 y.o.   MRN: [id number]  Note was precharted 12/11/2024.  HPI  Timothy Conway is a 69 y.o.-year-old male, initially referred by Dr. Eletha, presenting for follow-up for papillary thyroid  cancer, postsurgical hypothyroidism, and osteoporosis.  Last visit 6 months ago.  Interim history: In 2021, he was diagnosed with 11 mm left adenocarcinoma. He had radiotactic RxTx.  No shortness of breath or chest pain. He has dystonia due to being on higher doses of Xanax  for alcoholism in the past (he quit drinking and smoking in 2024) >> started Klonopin  and was also on physical therapy for this.  This is mostly affecting his mouth and hands. Now on [place]  >> not helping.  He is embarrassed to go out in public due to the continues movement of his hands and mouth. Since last visit, he had a right ankle fracture 08/29/2024.  ThyCA: Reviewed and addended his thyroid  cancer history: Pt. has been found to have a hypermetabolic thyroid  nodule on a PET scan (07/13/2017) obtained by Dr. Shelah, his pulmonologist, to investigate a lung nodule.  A thyroid  U/S (07/30/2017) showed a L lobe 1.3 x 1.0 x 1.2 cm nodule, solid, hypoechoic, lobulated/irregular, with peripheral calcifications. Bx of this nodule (07/30/2017): PTC  Total thyroidectomy (08/27/2017) by Dr. Eletha: Diagnosis 1. Thyroid , thyroidectomy, total - PAPILLARY THYROID  CARCINOMA, CLASSIC TYPE, SPANNING 1.4 CM. - NO EXTRATHYROIDAL EXTENSION. - RESECTION MARGINS ARE NEGATIVE. - SEE ONCOLOGY TABLE. 2. Lymph node, biopsy, central compartment - THREE OF THREE LYMPH NODES NEGATIVE FOR CARCINOMA (0/3). - ONE BENIGN PARATHYROID. Microscopic Comment 1. THYROID  Specimen: Total thyroid  with central compartment lymph nodes. Procedure (including lymph node sampling if applicable): Total thyroidectomy with central lymph node biopsy. Specimen Integrity (intact/fragmented): Intact. Tumor focality:  Unifocal. Dominant tumor: Maximum tumor size (cm): 1.4 cm. Tumor laterality: Left. Histologic type (including subtype and/or unique features as applicable): Papillary thyroid  carcinoma, classic type. Tumor capsule: Present. Extrathyroidal extension: Absent. Capsular invasion with degree of invasion if present: N/A. Margins: Negative. Lymphatic or vascular invasion: Not identified. Lymph nodes: # examined 3; # positive; 0 Extracapsular extension (if applicable): N/A. TNM code: pT1b, pN0 Non-neoplastic thyroid : Largely unremarkable. Comments: There tumor is largely fibrotic and calcified with only a small portion of viable tumor.  Neck U/S (09/25/2019): The thyroid  gland is surgically absent. Evaluation of the left and right resection bed demonstrates no evidence of residual thyroid  tissue or nodularity. No suspicious lymphadenopathy is identified. IMPRESSION: Surgical changes of prior total thyroidectomy without evidence of residual or recurrent thyroid  tissue, nodularity or lymphadenopathy.  PET/CT (09/15/2020): Increased uptake in the base of the tongue and anterior paravertebral muscles  CT neck (07/24/2020): Increased uptake is most likely related to tonsillar inflammation  CT chest (11/14/2020) without contrast: No suspicious masses in mediastinum or neck.  CT chest (06/24/2021): Mediastinum/Nodes: No enlarged mediastinal, hilar, or axillary lymph nodes. Thyroid  gland, trachea, and esophagus demonstrate no significant findings.  CT chest (10/16/2021): Unchanged appearance of predominantly bandlike post radiation fibrosis and adjacent ground-glass about fiducial markers of the anterior left upper lobe. No evidence of recurrent or metastatic disease in the chest.  CT chest (04/15/2022): Mediastinum/Nodes: No pathologically enlarged mediastinal or hilar lymph nodes. Please note that accurate exclusion of hilar adenopathy is limited on noncontrast CT scans. Esophagus is  unremarkable in appearance. No axillary lymphadenopathy.   Lungs/Pleura: Nodular area of architectural distortion surrounded by septal thickening and ground-glass attenuation in the left upper lobe, adjacent to 2 fiducial  markers, compatible with a treated neoplasm. This appears grossly stable compared to the prior study. No other new suspicious appearing pulmonary nodules or masses are noted. When compared to the prior study there has been worsening scarring/atelectasis in the right middle lobe, predominantly in the lateral segment.   IMPRESSION: 1. Stable appearance of treated neoplasm in the left upper lobe. No definitive findings to suggest residual or metastatic disease in the thorax. 2. Worsening scarring/atelectasis in the lateral segment of the right middle lobe. 3. Aortic atherosclerosis. 4. Hepatic steatosis. 5. Additional incidental findings, as above.  CT chest (04/09/2023): 1. Unchanged post treatment/post radiation appearance of the anterior left upper lobe with irregular scarring and volume loss with biopsy marking clips and or fiducial markers. Most discrete residual nodule anteriorly measures 1.1 x 0.8 cm, unchanged. 2. No evidence of recurrent or metastatic disease in the chest. 3. Mildly coarse contour of the liver, suggestive of cirrhosis.  CT chest (04/28/2024): 1. Similar appearance of post treatment changes in the left upper lobe. 2. Slight interval increase in the size of several ground-glass nodules in the anterior left upper lobe since the prior CT. No new nodule or suspicious lesion. 3.  Aortic Atherosclerosis (ICD10-I70.0).  Thyroglobulin is detectable: Lab Results  Component Value Date   THYROGLB 0.5 (L) 06/12/2024   THYROGLB 0.2 (L) 03/14/2024   THYROGLB 0.4 (L) 01/26/2024   THYROGLB 0.2 (L) 07/26/2023   THYROGLB 0.2 (L) 06/05/2022   THYROGLB 0.3 (L) 09/11/2021   THYROGLB 0.1 (L) 10/29/2020   THYROGLB 0.7 (L) 09/17/2020   THYROGLB 0.1 (L)  09/14/2019   THYROGLB 0.2 (L) 09/08/2018   THYROGLB 0.1 (L) 03/23/2018   His antithyroid antibodies are undetectable: Lab Results  Component Value Date   THGAB <1 06/12/2024   THGAB <1 01/26/2024   THGAB <1 07/26/2023   THGAB <1 06/05/2022   THGAB <1 09/11/2021   THGAB <1 10/29/2020   THGAB <1 09/17/2020   THGAB <1 09/14/2019   THGAB <1 09/08/2018   THGAB <1 03/23/2018   Postsurgical hypothyroidism  He is on Levothyroxine  150 mcg daily (increased 05/2024) - in am - fasting - at least 30 min from b'fast (however, he usually skips breakfast) - + calcium  1x a day with dinner - no iron - + multivitamins at night - + PPIs at night (Omeprazole) - not on Biotin + Magnesium and Zinc - at night.  Reviewed his TFTs: Lab Results  Component Value Date   TSH 1.03 07/27/2024   TSH 9.09 (H) 06/12/2024   TSH 1.11 03/14/2024   TSH 7.49 (H) 01/26/2024   TSH 1.87 07/26/2023   TSH 0.602 02/18/2023   TSH 1.60 06/05/2022   TSH 0.93 12/12/2021   TSH 0.19 (L) 10/28/2021   TSH 0.15 (L) 09/11/2021   FREET4 1.7 07/27/2024   FREET4 1.3 06/12/2024   FREET4 1.5 03/14/2024   FREET4 1.19 07/26/2023   FREET4 1.13 06/05/2022   FREET4 1.10 12/12/2021   FREET4 0.89 10/28/2021   FREET4 1.17 09/11/2021   FREET4 1.19 12/25/2020   FREET4 1.13 10/29/2020     Pt denies: - feeling nodules in neck - hoarseness - dysphagia - choking  Unknown family history of thyroid  disease or thyroid  cancer since he was adopted. No h/o radiation tx to head or neck. No herbal supplements. No Biotin use. No recent steroids use.   Clinical osteoporosis:  He was found to have a vertebral fracture after fall from level ground Spring 2022. He fell onto his tailbone and  fractured 1 vertebra.  He had kyphoplasty and then had another compression fracture.  His back pain improved since then.    MRI lumbar spine (08/09/2021): 1. Compression fracture of the T11 vertebral body status post vertebral augmentation with  persistent marrow edema. 2. Minor compression fracture of the superior endplate of T12 on the right side with persistent marrow edema without significant height loss.  CT chest (04/15/2022): Musculoskeletal: Chronic compression fracture of T11 with 30% loss of anterior vertebral body height. Post vertebroplasty changes are noted at T11 and T12. Several old healed bilateral rib fractures are again noted. There are no aggressive appearing lytic or blastic lesions noted in the visualized portions of the skeleton.  CT chest (04/09/2023): No acute osseous findings. Unchanged vertebral cement augmentation of T11 and T12.  CT chest w/o contrast (09/27/2023): 1. Stable left upper lobe solid nodule with adjacent opacities which are likely due to postradiation change. No evidence of recurrent or metastatic disease in the chest. 2. Stable small solid and ground-glass pulmonary nodules. Recommend attention on follow-up. 3.  Aortic Atherosclerosis (ICD10-I70.0).   He had a bone density scan several years ago in [facility] reportedly showing osteopenia.  Reviewed latest bone density scan reports: 10/14/2023 [facility]) Lumbar spine L1-L3 (L4) Femoral neck (FN) 33% Distal radius Ultra distal radius  T-score  1.8 RFN: -1.5 LFN: -1.0 0.4 0.0   09/16/2021 () Lumbar spine L1-L4 Femoral neck (FN)  T-score  1.2 RFN: -1.0 LFN: -1.2    We started Fosamax  70 mg weekly in 08/2021.  He tolerates this well.  No other fractures as an adult, but had a wrist fracture as a child.  He has a history of vitamin D  deficiency, but latest level was normal: Lab Results  Component Value Date   VD25OH 35 01/26/2024   VD25OH 42.01 07/26/2023   VD25OH 49.05 06/05/2022   VD25OH 60.58 09/11/2021  He is on vitamin D  2000 units daily.  Latest kidney function: Lab Results  Component Value Date   BUN 17 10/30/2024   BUN 27 (H) 04/28/2024   CREATININE 1.07 10/30/2024   CREATININE 1.34 (H)  04/28/2024  04/28/2024: GFR 58. No results found for: GFR  Per review the chart, he presented with AMS 3 days after stopping alcohol 05/30/2023-previously drinking 9-10 drinks a day.  A CT of the head at that time showed chronic bilateral basal ganglia and cerebellar infarcts. He quit drinking afterwards.  ROS: + See HPI + tremors  I reviewed pt's medications, allergies, PMH, social hx, family hx, and changes were documented in the history of present illness. Otherwise, unchanged from my initial visit note.  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) 06/05/2023   Altered mental status, unspecified 06/05/2023   Anxiety    Cerebral infarction, unspecified (HCC) 06/05/2023   Cerebral ischemia 06/05/2023   COPD (chronic obstructive pulmonary disease) (HCC)    Depression    Dry eye syndrome    Dyslipidemia 01/05/2023   Dysrhythmia    hx of PAC's   Effusion of elbow joint, right 06/29/2017   Gastric ulcer with perforation but without obstruction (HCC) 04/15/2016   GERD (gastroesophageal reflux disease)    Gout    HTN (hypertension), benign 02/18/2023   Hyperlipidemia    Hypertension    Hypothyroidism    Hypothyroidism, unspecified 06/05/2023  Malignant neoplasm of thyroid  gland (HCC) 06/05/2023   Muscle wasting and atrophy, not elsewhere classified, unspecified site 06/07/2023   Muscle wasting and atrophy, not elsewhere classified, unspecified site 06/07/2023   Nodule of left lung 02/18/2023   Non-restorative sleep 02/18/2023   OSA (obstructive sleep apnea) 02/18/2023   Papillary thyroid  carcinoma s/p total thyroidectomy & CLND 08/27/2017 08/24/2017   Penetrating ulcer of aorta 02/18/2023   Postsurgical hypothyroidism 10/01/2017    Protrusion of intervertebral disc of thoracic region 02/18/2023   Seborrheic dermatitis 02/18/2023   Thyroid  cancer (HCC)    2018   Wheezing 06/05/2023   Past Surgical History:  Procedure Laterality Date   ABDOMINAL SURGERY     hemmoraging ulcer   BRONCHIAL BIOPSY  06/25/2020   Procedure: BRONCHIAL BIOPSIES;  Surgeon: Shelah Lamar RAMAN, MD;  Location: [facility] ENDOSCOPY;  Service: Pulmonary;;   BRONCHIAL BRUSHINGS  06/25/2020   Procedure: BRONCHIAL BRUSHINGS;  Surgeon: Shelah Lamar RAMAN, MD;  Location: [facility] ENDOSCOPY;  Service: Pulmonary;;   BRONCHIAL NEEDLE ASPIRATION BIOPSY  06/25/2020   Procedure: BRONCHIAL NEEDLE ASPIRATION BIOPSIES;  Surgeon: Shelah Lamar RAMAN, MD;  Location: [facility] ENDOSCOPY;  Service: Pulmonary;;   BRONCHIAL WASHINGS  06/25/2020   Procedure: BRONCHIAL WASHINGS;  Surgeon: Shelah Lamar RAMAN, MD;  Location: [facility] ENDOSCOPY;  Service: Pulmonary;;   CATARACT EXTRACTION Right    COLONOSCOPY     EYE SURGERY Right    x 4  between ages 71-13   FIDUCIAL MARKER PLACEMENT  06/25/2020   Procedure: FIDUCIAL MARKER PLACEMENT;  Surgeon: Shelah Lamar RAMAN, MD;  Location[facility] ENDOSCOPY;  Service: Pulmonary;;   THYROIDECTOMY N/A 08/27/2017   Procedure: TOTAL THYROIDECTOMY WITH LIMITED LYMPH NODE DISSECTION;  Surgeon: Eletha Boas, MD;  Location: [facility] ORS;  Service: General;  Laterality: N/A;   UPPER GI ENDOSCOPY     VIDEO BRONCHOSCOPY WITH ENDOBRONCHIAL NAVIGATION N/A 06/25/2020   Procedure: VIDEO BRONCHOSCOPY WITH ENDOBRONCHIAL NAVIGATION;  Surgeon: Shelah Lamar RAMAN, MD;  Location: [facility] ENDOSCOPY;  Service: Pulmonary;  Laterality: N/A;   Social History   Social History   Marital status: Single    Spouse name: N/A   Number of children: 0   Occupational History   n/a   Social History Main Topics   Smoking status: Current Every Day Smoker    Packs/day: 1.00    Years: 40.00    Types: Cigarettes   Smokeless tobacco: Never Used     Comment: 1 1/2 per day   Alcohol use 0.6 oz/week    3-4 Cans of beer per day    Drug use: No   Current Outpatient Medications on File Prior to Visit  Medication Sig Dispense Refill   alendronate  (FOSAMAX ) 70 MG tablet TAKE 1 TABLET EVERY 7 DAYS WITH A FULL GLASS OF WATER ON AN EMPTY STOMACH. 12 tablet 1   aspirin  EC 81 MG tablet Take 1 tablet (81 mg total) by mouth daily. Swallow whole.     atenolol  (TENORMIN ) 100 MG tablet Take 100 mg by mouth 2 (two) times daily.     CALCIUM  PO Take 1 tablet by mouth daily.     Cholecalciferol (VITAMIN D ) 2000 units tablet Take 2,000 Units by mouth daily.     desvenlafaxine (PRISTIQ) 50 MG 24 hr tablet Take 50 mg by mouth daily.     Deutetrabenazine  ER (AUSTEDO  XR) 36 MG TB24 Take 36 mg by mouth daily. 30 tablet 11   fenofibrate 160 MG tablet Take 160 mg by mouth daily with supper.  hydrocortisone 2.5 % cream Apply 1 application  topically 2 (two) times daily as needed (rash).     levothyroxine  (SYNTHROID ) 150 MCG tablet Take 1 tablet (150 mcg total) by mouth daily. 45 tablet 6   Multiple Vitamin (MULTIVITAMIN WITH MINERALS) TABS tablet Take 1 tablet by mouth daily.     omeprazole (PRILOSEC) 20 MG capsule Take 20 mg by mouth daily before breakfast.      rosuvastatin (CRESTOR) 10 MG tablet Take 10 mg by mouth daily.      zolpidem (AMBIEN) 10 MG tablet Take 10 mg by mouth at bedtime as needed.     No current facility-administered medications on file prior to visit.   Allergies  Allergen Reactions   Lorazepam Anxiety and Hypertension    Paranoid  Other Reaction(s): paranoid   Family History  Adopted: Yes  Problem Relation Age of Onset   Stroke Neg Hx    PE: There were no vitals taken for this visit. Wt Readings from Last 3 Encounters:  10/30/24 259 lb (117.5 kg)  08/24/24 271 lb (122.9 kg)  06/12/24 264 lb 9.6 oz (120 kg)   Constitutional: overweight, in NAD Eyes: EOMI, no exophthalmos ENT:no neck masses palpated, no cervical lymphadenopathy Cardiovascular: RRR, No MRG Respiratory: CTA B Musculoskeletal: no  deformities Skin: + Facial rash Neurological: + tremor with outstretched hands  ASSESSMENT: 1. Thyroid  cancer - see HPI  2. Postsurgical Hypothyroidism  3. Vertebral fracture  PLAN:  1. Papillary Thyroid  Cancer -Patient with history of classic type papillary thyroid  cancer not very large, encapsulated, without extrathyroidal extension, without lymphovascular invasion and negative lymph node biopsies.  Therefore, his cancer was considered low risk and we decided to continue without RAI treatment per the 2015 ATA thyroid  cancer guidelines.  We are following him with thyroglobulin tumor markers and also with neck ultrasounds. We did discuss that even if he ends up needing RAI treatment as a salvage therapy in the future, his prognosis would likely not be changed. - Neck ultrasound did not show any metastases or recurrence of his neck.  Also PET scan and CT scans obtained during lung cancer staging did not show any thyroid  cancer recurrence or metastasis.  He had another CT scan before last visit and this again did not show any worrisome improvement - His thyroglobulin levels are not undetectable, since he did not have RAI treatment.  At last visit, thyroglobulin was slightly higher, at 0.5, in the setting of a high TSH.  We increased the dose of levothyroxine  and we will repeat his thyroglobulin now - At today's visit we will repeat his thyroglobulin and antithyroglobulin antibodies - I will see him back in 6 months (he would not want to spread the appointments a year apart)  2. Patient with history of total thyroidectomy for thyroid  cancer now with iatrogenic hypothyroidism on levothyroxine  therapy - latest thyroid  labs reviewed with pt. >> normal: Lab Results  Component Value Date   TSH 1.03 07/27/2024  - he continues on LT4 150 mcg daily, dose increased at last visit - pt feels good on this dose. - we discussed about taking the thyroid  hormone every day, with water, >30 minutes before  breakfast, separated by >4 hours from acid reflux medications, calcium , iron, multivitamins. Pt. is taking it correctly. - will check his TSH today  3.  Clinical osteoporosis - Since last visit, he had a right closed ankle fracture 08/29/2024 after a fall.   - In 2022, he had 2 vertebral fractures after a  fall from level ground, which qualified as fragility fracture putting him in the osteoporotic category.  He had kyphoplasty and back pain improved afterwards.  We checked a bone density scan and it showed osteopenia, however, due to the presence of fragility fractures, he did not qualify for clinical osteoporosis.  We repeated a bone density in 2024 but this was checked on another machine so the scores were not directly comparable.  Overall, the T-scores appears to be very stable at the left femoral neck and slightly worse in the right femoral neck but again, not directly comparable with the previous report.  He is due for another bone density scan later this year. - We had him on Fosamax  70 mg weekly, started in 2022.  He tolerates this well, without jaw/hip/thigh pain.  He takes this correctly, 30 minutes before levothyroxine , with a full glass of water.  He does have a history of radiation therapy sodium loss and Forteo are contraindicated. - At today's visit, we discussed that 1 fracture does not necessarily imply treatment failure, however, I am worried about Fosamax  absorption for him since he is on omeprazole.  Therefore, at today's visit, we discussed about Prolia versus Reclast. - We will recheck his vitamin D  level today.  Lela Fendt, MD PhD [facility] Endocrinology

## 2024-12-11 NOTE — Progress Notes (Unsigned)
 Patient ID: Timothy Conway, male   DOB: 1956/07/14, 69 y.o.   MRN: [id number]   HPI  Timothy Conway is a 69 y.o.-year-old male, initially referred by Timothy Conway, presenting for follow-up for papillary thyroid  cancer, postsurgical hypothyroidism, and osteoporosis.  Last visit 6 months ago.  Interim history: In 2021, he was diagnosed with 11 mm left adenocarcinoma. He had radiotactic RxTx.  No shortness of breath or chest pain. He has dystonia due to being on higher doses of Xanax  for alcoholism in the past (he quit drinking and smoking in 2024) >> started Klonopin  and was also on physical therapy for this.  This is mostly affecting his mouth and hands. Now on [place]  >> not helping.  He is embarrassed to go out in public due to the continues movement of his hands and mouth. Since last visit, he had a right ankle fracture 08/29/2024.  ThyCA: Reviewed and addended his thyroid  cancer history: Pt. has been found to have a hypermetabolic thyroid  nodule on a PET scan (07/13/2017) obtained by Dr. Shelah, his pulmonologist, to investigate a lung nodule.  A thyroid  U/S (07/30/2017) showed a L lobe 1.3 x 1.0 x 1.2 cm nodule, solid, hypoechoic, lobulated/irregular, with peripheral calcifications. Bx of this nodule (07/30/2017): PTC  Total thyroidectomy (08/27/2017) by Timothy Conway: Diagnosis 1. Thyroid , thyroidectomy, total - PAPILLARY THYROID  CARCINOMA, CLASSIC TYPE, SPANNING 1.4 CM. - NO EXTRATHYROIDAL EXTENSION. - RESECTION MARGINS ARE NEGATIVE. - SEE ONCOLOGY TABLE. 2. Lymph node, biopsy, central compartment - THREE OF THREE LYMPH NODES NEGATIVE FOR CARCINOMA (0/3). - ONE BENIGN PARATHYROID. Microscopic Comment 1. THYROID  Specimen: Total thyroid  with central compartment lymph nodes. Procedure (including lymph node sampling if applicable): Total thyroidectomy with central lymph node biopsy. Specimen Integrity (intact/fragmented): Intact. Tumor focality: Unifocal. Dominant tumor: Maximum tumor  size (cm): 1.4 cm. Tumor laterality: Left. Histologic type (including subtype and/or unique features as applicable): Papillary thyroid  carcinoma, classic type. Tumor capsule: Present. Extrathyroidal extension: Absent. Capsular invasion with degree of invasion if present: N/A. Margins: Negative. Lymphatic or vascular invasion: Not identified. Lymph nodes: # examined 3; # positive; 0 Extracapsular extension (if applicable): N/A. TNM code: pT1b, pN0 Non-neoplastic thyroid : Largely unremarkable. Comments: There tumor is largely fibrotic and calcified with only a small portion of viable tumor.  Neck U/S (09/25/2019): The thyroid  gland is surgically absent. Evaluation of the left and right resection bed demonstrates no evidence of residual thyroid  tissue or nodularity. No suspicious lymphadenopathy is identified. IMPRESSION: Surgical changes of prior total thyroidectomy without evidence of residual or recurrent thyroid  tissue, nodularity or lymphadenopathy.  PET/CT (09/15/2020): Increased uptake in the base of the tongue and anterior paravertebral muscles  CT neck (07/24/2020): Increased uptake is most likely related to tonsillar inflammation  CT chest (11/14/2020) without contrast: No suspicious masses in mediastinum or neck.  CT chest (06/24/2021): Mediastinum/Nodes: No enlarged mediastinal, hilar, or axillary lymph nodes. Thyroid  gland, trachea, and esophagus demonstrate no significant findings.  CT chest (10/16/2021): Unchanged appearance of predominantly bandlike post radiation fibrosis and adjacent ground-glass about fiducial markers of the anterior left upper lobe. No evidence of recurrent or metastatic disease in the chest.  CT chest (04/15/2022): Mediastinum/Nodes: No pathologically enlarged mediastinal or hilar lymph nodes. Please note that accurate exclusion of hilar adenopathy is limited on noncontrast CT scans. Esophagus is unremarkable in appearance. No axillary  lymphadenopathy.   Lungs/Pleura: Nodular area of architectural distortion surrounded by septal thickening and ground-glass attenuation in the left upper lobe, adjacent to 2 fiducial markers, compatible with a  treated neoplasm. This appears grossly stable compared to the prior study. No other new suspicious appearing pulmonary nodules or masses are noted. When compared to the prior study there has been worsening scarring/atelectasis in the right middle lobe, predominantly in the lateral segment.   IMPRESSION: 1. Stable appearance of treated neoplasm in the left upper lobe. No definitive findings to suggest residual or metastatic disease in the thorax. 2. Worsening scarring/atelectasis in the lateral segment of the right middle lobe. 3. Aortic atherosclerosis. 4. Hepatic steatosis. 5. Additional incidental findings, as above.  CT chest (04/09/2023): 1. Unchanged post treatment/post radiation appearance of the anterior left upper lobe with irregular scarring and volume loss with biopsy marking clips and or fiducial markers. Most discrete residual nodule anteriorly measures 1.1 x 0.8 cm, unchanged. 2. No evidence of recurrent or metastatic disease in the chest. 3. Mildly coarse contour of the liver, suggestive of cirrhosis.  CT chest (04/28/2024): 1. Similar appearance of post treatment changes in the left upper lobe. 2. Slight interval increase in the size of several ground-glass nodules in the anterior left upper lobe since the prior CT. No new nodule or suspicious lesion. 3.  Aortic Atherosclerosis (ICD10-I70.0).  Thyroglobulin is detectable: Lab Results  Component Value Date   THYROGLB 0.5 (L) 06/12/2024   THYROGLB 0.2 (L) 03/14/2024   THYROGLB 0.4 (L) 01/26/2024   THYROGLB 0.2 (L) 07/26/2023   THYROGLB 0.2 (L) 06/05/2022   THYROGLB 0.3 (L) 09/11/2021   THYROGLB 0.1 (L) 10/29/2020   THYROGLB 0.7 (L) 09/17/2020   THYROGLB 0.1 (L) 09/14/2019   THYROGLB 0.2 (L) 09/08/2018    THYROGLB 0.1 (L) 03/23/2018   His antithyroid antibodies are undetectable: Lab Results  Component Value Date   THGAB <1 06/12/2024   THGAB <1 01/26/2024   THGAB <1 07/26/2023   THGAB <1 06/05/2022   THGAB <1 09/11/2021   THGAB <1 10/29/2020   THGAB <1 09/17/2020   THGAB <1 09/14/2019   THGAB <1 09/08/2018   THGAB <1 03/23/2018   Postsurgical hypothyroidism  He is on Levothyroxine  150 mcg daily (increased 05/2024) - in am - fasting - at least 30 min from b'fast (however, he usually skips breakfast) - + calcium  1x a day with dinner - no iron - + multivitamins at night - + PPIs at night (Omeprazole) - not on Biotin + Magnesium and Zinc - at night.  Reviewed his TFTs: Lab Results  Component Value Date   TSH 1.03 07/27/2024   TSH 9.09 (H) 06/12/2024   TSH 1.11 03/14/2024   TSH 7.49 (H) 01/26/2024   TSH 1.87 07/26/2023   TSH 0.602 02/18/2023   TSH 1.60 06/05/2022   TSH 0.93 12/12/2021   TSH 0.19 (L) 10/28/2021   TSH 0.15 (L) 09/11/2021   FREET4 1.7 07/27/2024   FREET4 1.3 06/12/2024   FREET4 1.5 03/14/2024   FREET4 1.19 07/26/2023   FREET4 1.13 06/05/2022   FREET4 1.10 12/12/2021   FREET4 0.89 10/28/2021   FREET4 1.17 09/11/2021   FREET4 1.19 12/25/2020   FREET4 1.13 10/29/2020     Pt denies: - feeling nodules in neck - hoarseness - dysphagia - choking  Unknown family history of thyroid  disease or thyroid  cancer since he was adopted. No h/o radiation tx to head or neck. No herbal supplements. No Biotin use. No recent steroids use.   Clinical osteoporosis:  He was found to have a vertebral fracture after fall from level ground [place] 2022. He fell onto his tailbone and fractured 1 vertebra.  He had kyphoplasty and then had another compression fracture.  His back pain improved since then.    MRI lumbar spine (08/09/2021): 1. Compression fracture of the T11 vertebral body status post vertebral augmentation with persistent marrow edema. 2. Minor  compression fracture of the superior endplate of T12 on the right side with persistent marrow edema without significant height loss.  CT chest (04/15/2022): Musculoskeletal: Chronic compression fracture of T11 with 30% loss of anterior vertebral body height. Post vertebroplasty changes are noted at T11 and T12. Several old healed bilateral rib fractures are again noted. There are no aggressive appearing lytic or blastic lesions noted in the visualized portions of the skeleton.  CT chest (04/09/2023): No acute osseous findings. Unchanged vertebral cement augmentation of T11 and T12.  CT chest w/o contrast (09/27/2023): 1. Stable left upper lobe solid nodule with adjacent opacities which are likely due to postradiation change. No evidence of recurrent or metastatic disease in the chest. 2. Stable small solid and ground-glass pulmonary nodules. Recommend attention on follow-up. 3.  Aortic Atherosclerosis (ICD10-I70.0).   He had a bone density scan several years ago in [place] reportedly showing osteopenia.  Reviewed latest bone density scan reports: 10/14/2023 [facility]) Lumbar spine L1-L3 (L4) Femoral neck (FN) 33% Distal radius Ultra distal radius  T-score  1.8 RFN: -1.5 LFN: -1.0 0.4 0.0   09/16/2021 ([place]) Lumbar spine L1-L4 Femoral neck (FN)  T-score  1.2 RFN: -1.0 LFN: -1.2    We started Fosamax  70 mg weekly in 08/2021.  He tolerates this well.  No other fractures as an adult, but had a wrist fracture as a child.  He has a history of vitamin D  deficiency, but latest level was normal: Lab Results  Component Value Date   VD25OH 35 01/26/2024   VD25OH 42.01 07/26/2023   VD25OH 49.05 06/05/2022   VD25OH 60.58 09/11/2021  He is on vitamin D  2000 units daily.  Latest kidney function: Lab Results  Component Value Date   BUN 17 10/30/2024   BUN 27 (H) 04/28/2024   CREATININE 1.07 10/30/2024   CREATININE 1.34 (H) 04/28/2024  04/28/2024: GFR 58. No  results found for: GFR  Per review the chart, he presented with AMS 3 days after stopping alcohol 05/30/2023-previously drinking 9-10 drinks a day.  A CT of the head at that time showed chronic bilateral basal ganglia and cerebellar infarcts. He quit drinking afterwards.  ROS: + See HPI + tremors  I reviewed pt's medications, allergies, PMH, social hx, family hx, and changes were documented in the history of present illness. Otherwise, unchanged from my initial visit note.  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) 06/05/2023   Altered mental status, unspecified 06/05/2023   Anxiety    Cerebral infarction, unspecified (HCC) 06/05/2023   Cerebral ischemia 06/05/2023   COPD (chronic obstructive pulmonary disease) (HCC)    Depression    Dry eye syndrome    Dyslipidemia 01/05/2023   Dysrhythmia    hx of PAC's   Effusion of elbow joint, right 06/29/2017   Gastric ulcer with perforation but without obstruction (HCC) 04/15/2016   GERD (gastroesophageal reflux disease)    Gout    HTN (hypertension), benign 02/18/2023   Hyperlipidemia    Hypertension    Hypothyroidism    Hypothyroidism, unspecified 06/05/2023   Malignant neoplasm  of thyroid  gland (HCC) 06/05/2023   Muscle wasting and atrophy, not elsewhere classified, unspecified site 06/07/2023   Muscle wasting and atrophy, not elsewhere classified, unspecified site 06/07/2023   Nodule of left lung 02/18/2023   Non-restorative sleep 02/18/2023   OSA (obstructive sleep apnea) 02/18/2023   Papillary thyroid  carcinoma s/p total thyroidectomy & CLND 08/27/2017 08/24/2017   Penetrating ulcer of aorta 02/18/2023   Postsurgical hypothyroidism 10/01/2017   Protrusion of intervertebral disc of  thoracic region 02/18/2023   Seborrheic dermatitis 02/18/2023   Thyroid  cancer (HCC)    2018   Wheezing 06/05/2023   Past Surgical History:  Procedure Laterality Date   ABDOMINAL SURGERY     hemmoraging ulcer   BRONCHIAL BIOPSY  06/25/2020   Procedure: BRONCHIAL BIOPSIES;  Surgeon: Timothy Conway Lamar RAMAN, MD;  Location: [facility] ENDOSCOPY;  Service: Pulmonary;;   BRONCHIAL BRUSHINGS  06/25/2020   Procedure: BRONCHIAL BRUSHINGS;  Surgeon: Timothy Conway Lamar RAMAN, MD;  Location: [facility] ENDOSCOPY;  Service: Pulmonary;;   BRONCHIAL NEEDLE ASPIRATION BIOPSY  06/25/2020   Procedure: BRONCHIAL NEEDLE ASPIRATION BIOPSIES;  Surgeon: Timothy Conway Lamar RAMAN, MD;  Location: [facility] ENDOSCOPY;  Service: Pulmonary;;   BRONCHIAL WASHINGS  06/25/2020   Procedure: BRONCHIAL WASHINGS;  Surgeon: Timothy Conway Lamar RAMAN, MD;  Location: [facility] ENDOSCOPY;  Service: Pulmonary;;   CATARACT EXTRACTION Right    COLONOSCOPY     EYE SURGERY Right    x 4  between ages 83-13   FIDUCIAL MARKER PLACEMENT  06/25/2020   Procedure: FIDUCIAL MARKER PLACEMENT;  Surgeon: Timothy Conway Lamar RAMAN, MD;  Location: [facility] ENDOSCOPY;  Service: Pulmonary;;   THYROIDECTOMY N/A 08/27/2017   Procedure: TOTAL THYROIDECTOMY WITH LIMITED LYMPH NODE DISSECTION;  Surgeon: Timothy Conway Boas, MD;  Location: [facility] ORS;  Service: General;  Laterality: N/A;   UPPER GI ENDOSCOPY     VIDEO BRONCHOSCOPY WITH ENDOBRONCHIAL NAVIGATION N/A 06/25/2020   Procedure: VIDEO BRONCHOSCOPY WITH ENDOBRONCHIAL NAVIGATION;  Surgeon: Timothy Conway Lamar RAMAN, MD;  Location: [facility] ENDOSCOPY;  Service: Pulmonary;  Laterality: N/A;   Social History   Social History   Marital status: Single    Spouse name: N/A   Number of children: 0   Occupational History   n/a   Social History Main Topics   Smoking status: Current Every Day Smoker    Packs/day: 1.00    Years: 40.00    Types: Cigarettes   Smokeless tobacco: Never Used     Comment: 1 1/2 per day   Alcohol use 0.6 oz/week    3-4 Cans of beer per day   Drug use: No   Current  Outpatient Medications on File Prior to Visit  Medication Sig Dispense Refill   alendronate  (FOSAMAX ) 70 MG tablet TAKE 1 TABLET EVERY 7 DAYS WITH A FULL GLASS OF WATER ON AN EMPTY STOMACH. 12 tablet 1   aspirin  EC 81 MG tablet Take 1 tablet (81 mg total) by mouth daily. Swallow whole.     atenolol  (TENORMIN ) 100 MG tablet Take 100 mg by mouth 2 (two) times daily.     CALCIUM  PO Take 1 tablet by mouth daily.     Cholecalciferol (VITAMIN D ) 2000 units tablet Take 2,000 Units by mouth daily.     desvenlafaxine (PRISTIQ) 50 MG 24 hr tablet Take 50 mg by mouth daily.     Deutetrabenazine  ER (AUSTEDO  XR) 36 MG TB24 Take 36 mg by mouth daily. 30 tablet 11   fenofibrate 160 MG tablet Take 160 mg by mouth daily with supper.      hydrocortisone  2.5 % cream Apply 1 application  topically 2 (two) times daily as needed (rash).     levothyroxine  (SYNTHROID ) 150 MCG tablet Take 1 tablet (150 mcg total) by mouth daily. 45 tablet 6   Multiple Vitamin (MULTIVITAMIN WITH MINERALS) TABS tablet Take 1 tablet by mouth daily.     omeprazole (PRILOSEC) 20 MG capsule Take 20 mg by mouth daily before breakfast.      rosuvastatin (CRESTOR) 10 MG tablet Take 10 mg by mouth daily.      zolpidem (AMBIEN) 10 MG tablet Take 10 mg by mouth at bedtime as needed.     No current facility-administered medications on file prior to visit.   Allergies  Allergen Reactions   Lorazepam Anxiety and Hypertension    Paranoid  Other Reaction(s): paranoid   Family History  Adopted: Yes  Problem Relation Age of Onset   Stroke Neg Hx    PE: There were no vitals taken for this visit. Wt Readings from Last 3 Encounters:  10/30/24 259 lb (117.5 kg)  08/24/24 271 lb (122.9 kg)  06/12/24 264 lb 9.6 oz (120 kg)   Constitutional: overweight, in NAD Eyes: EOMI, no exophthalmos ENT:no neck masses palpated, no cervical lymphadenopathy Cardiovascular: RRR, No MRG Respiratory: CTA B Musculoskeletal: no deformities Skin: + Facial  rash Neurological: + tremor with outstretched hands  ASSESSMENT: 1. Thyroid  cancer - see HPI  2. Postsurgical Hypothyroidism  3. Vertebral fracture  PLAN:  1. Papillary Thyroid  Cancer -Patient with history of classic type papillary thyroid  cancer not very large, encapsulated, without extrathyroidal extension, without lymphovascular invasion and negative lymph node biopsies.  Therefore, his cancer was considered low risk and we decided to continue without RAI treatment per the 2015 ATA thyroid  cancer guidelines.  We are following him with thyroglobulin tumor markers and also with neck ultrasounds. We did discuss that even if he ends up needing RAI treatment as a salvage therapy in the future, his prognosis would likely not be changed. - Neck ultrasound did not show any metastases or recurrence of his neck.  Also PET scan and CT scans obtained during lung cancer staging did not show any thyroid  cancer recurrence or metastasis.  He had another CT scan before last visit and this again did not show any worrisome improvement - His thyroglobulin levels are not undetectable, since he did not have RAI treatment.  At last visit, thyroglobulin was slightly higher, at 0.5, in the setting of a high TSH.  We increased the dose of levothyroxine  and we will repeat his thyroglobulin now - At today's visit we will repeat his thyroglobulin and antithyroglobulin antibodies - I will see him back in 6 months (he would not want to spread the appointments a year apart)  2. Patient with history of total thyroidectomy for thyroid  cancer now with iatrogenic hypothyroidism on levothyroxine  therapy - latest thyroid  labs reviewed with pt. >> normal: Lab Results  Component Value Date   TSH 1.03 07/27/2024  - he continues on LT4 150 mcg daily, dose increased at last visit - pt feels good on this dose. - we discussed about taking the thyroid  hormone every day, with water, >30 minutes before breakfast, separated by >4 hours  from acid reflux medications, calcium , iron, multivitamins. Pt. is taking it correctly. - will check his TSH today  3.  Clinical osteoporosis - Since last visit, he had a right closed ankle fracture 08/29/2024 after a fall.   - In 2022, he had 2 vertebral fractures after a fall  from level ground, which qualified as fragility fracture putting him in the osteoporotic category.  He had kyphoplasty and back pain improved afterwards.  We checked a bone density scan and it showed osteopenia, however, due to the presence of fragility fractures, he did not qualify for clinical osteoporosis.  We repeated a bone density in 2024 but this was checked on another machine so the scores were not directly comparable.  Overall, the T-scores appears to be very stable at the left femoral neck and slightly worse in the right femoral neck but again, not directly comparable with the previous report.  He is due for another bone density scan later this year. - We had him on Fosamax  70 mg weekly, started in 2022.  He tolerates this well, without jaw/hip/thigh pain.  He takes this correctly, 30 minutes before levothyroxine , with a full glass of water.  He does have a history of radiation therapy sodium loss and Forteo are contraindicated. - At today's visit, we discussed that 1 fracture does not necessarily imply treatment failure, however, I am worried about Fosamax  absorption for him since he is on omeprazole.  Therefore, at today's visit, we discussed about Prolia versus Reclast. - We will recheck his vitamin D  level today.  Lela Fendt, MD PhD [facility] Endocrinology

## 2024-12-12 ENCOUNTER — Ambulatory Visit: Admitting: Internal Medicine

## 2024-12-12 ENCOUNTER — Encounter: Payer: Self-pay | Admitting: Internal Medicine

## 2024-12-12 ENCOUNTER — Other Ambulatory Visit

## 2024-12-12 VITALS — BP 122/70 | HR 54 | Ht 73.0 in | Wt 267.6 lb

## 2024-12-12 DIAGNOSIS — C73 Malignant neoplasm of thyroid gland: Secondary | ICD-10-CM

## 2024-12-12 DIAGNOSIS — E890 Postprocedural hypothyroidism: Secondary | ICD-10-CM | POA: Diagnosis not present

## 2024-12-12 DIAGNOSIS — E559 Vitamin D deficiency, unspecified: Secondary | ICD-10-CM | POA: Diagnosis not present

## 2024-12-12 DIAGNOSIS — M810 Age-related osteoporosis without current pathological fracture: Secondary | ICD-10-CM | POA: Diagnosis not present

## 2024-12-12 NOTE — Patient Instructions (Addendum)
 Please continue Synthroid  150 mcg daily.  Take the thyroid  hormone every day, with water, at least 30 minutes before breakfast, separated by at least 4 hours from: - acid reflux medications - calcium  - iron - multivitamins  Please stop at the lab.  Continue vitamin D  2000 units daily.  Continue Fosamax  70 mg weekly.  Let's try to change to Prolia at the [facility].  Please come back for a follow-up appointment in 6 months.

## 2024-12-13 ENCOUNTER — Ambulatory Visit: Attending: Cardiovascular Disease

## 2024-12-13 DIAGNOSIS — I639 Cerebral infarction, unspecified: Secondary | ICD-10-CM | POA: Diagnosis not present

## 2024-12-13 LAB — CUP PACEART REMOTE DEVICE CHECK
Date Time Interrogation Session: 20260113233816
Implantable Pulse Generator Implant Date: 20240514

## 2024-12-14 ENCOUNTER — Ambulatory Visit: Payer: Self-pay | Admitting: Internal Medicine

## 2024-12-14 ENCOUNTER — Other Ambulatory Visit: Payer: Self-pay | Admitting: Internal Medicine

## 2024-12-14 DIAGNOSIS — E890 Postprocedural hypothyroidism: Secondary | ICD-10-CM

## 2024-12-14 LAB — TSH: TSH: 6.2 m[IU]/L — ABNORMAL HIGH (ref 0.40–4.50)

## 2024-12-14 LAB — BASIC METABOLIC PANEL WITHOUT GFR
BUN: 25 mg/dL (ref 7–25)
CO2: 30 mmol/L (ref 20–32)
Calcium: 8.8 mg/dL (ref 8.6–10.3)
Chloride: 102 mmol/L (ref 98–110)
Creat: 0.92 mg/dL (ref 0.70–1.35)
Glucose, Bld: 84 mg/dL (ref 65–99)
Potassium: 4.2 mmol/L (ref 3.5–5.3)
Sodium: 140 mmol/L (ref 135–146)

## 2024-12-14 LAB — THYROGLOBULIN ANTIBODY: Thyroglobulin Ab: <1 [IU]/mL

## 2024-12-14 LAB — THYROGLOBULIN LEVEL: Thyroglobulin: 0.3 ng/mL — ABNORMAL LOW

## 2024-12-14 LAB — VITAMIN D 25 HYDROXY (VIT D DEFICIENCY, FRACTURES): Vit D, 25-Hydroxy: 41 ng/mL (ref 30–100)

## 2024-12-14 NOTE — Addendum Note (Signed)
 Addended by: TRIXIE FILE on: 12/14/2024 04:49 PM   Modules accepted: Orders

## 2024-12-15 ENCOUNTER — Ambulatory Visit: Payer: Self-pay | Admitting: Cardiovascular Disease

## 2024-12-15 NOTE — Progress Notes (Signed)
 "   New Patient Office Visit   Subjective     Patient ID: Timothy Conway, male   DOB: [birth year]  Age: 69 y.o. MRN: [id number]   CC:  No chief complaint on file.     HPI Timothy Conway presents to establish care       Show/hide medication list[1] 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) 06/05/2023   Altered mental status, unspecified 06/05/2023   Anxiety    Cerebral infarction, unspecified (HCC) 06/05/2023   Cerebral ischemia 06/05/2023   COPD (chronic obstructive pulmonary disease) (HCC)    Depression    Dry eye syndrome    Dyslipidemia 01/05/2023   Dysrhythmia    hx of PAC's   Effusion of elbow joint, right 06/29/2017   Gastric ulcer with perforation but without obstruction (HCC) 04/15/2016   GERD (gastroesophageal reflux disease)    Gout    HTN (hypertension), benign 02/18/2023   Hyperlipidemia    Hypertension    Hypothyroidism    Hypothyroidism, unspecified 06/05/2023   Malignant neoplasm of thyroid  gland (HCC) 06/05/2023   Muscle wasting and atrophy, not elsewhere classified, unspecified site 06/07/2023   Muscle wasting and atrophy, not elsewhere classified, unspecified site 06/07/2023   Nodule of left lung 02/18/2023   Non-restorative sleep 02/18/2023   OSA (obstructive sleep apnea) 02/18/2023   Papillary thyroid  carcinoma s/p total thyroidectomy & CLND 08/27/2017 08/24/2017   Penetrating ulcer of aorta 02/18/2023   Postsurgical hypothyroidism 10/01/2017   Protrusion of intervertebral disc of thoracic region 02/18/2023   Seborrheic dermatitis 02/18/2023   Thyroid  cancer (HCC)    2018   Wheezing 06/05/2023    Past Surgical History:  Procedure Laterality Date   ABDOMINAL SURGERY     hemmoraging ulcer    BRONCHIAL BIOPSY  06/25/2020   Procedure: BRONCHIAL BIOPSIES;  Surgeon: Shelah Lamar RAMAN, MD;  Location: [facility] ENDOSCOPY;  Service: Pulmonary;;   BRONCHIAL BRUSHINGS  06/25/2020   Procedure: BRONCHIAL BRUSHINGS;  Surgeon: Shelah Lamar RAMAN, MD;  Location: [facility] ENDOSCOPY;  Service: Pulmonary;;   BRONCHIAL NEEDLE ASPIRATION BIOPSY  06/25/2020   Procedure: BRONCHIAL NEEDLE ASPIRATION BIOPSIES;  Surgeon: Shelah Lamar RAMAN, MD;  Location: [facility] ENDOSCOPY;  Service: Pulmonary;;   BRONCHIAL WASHINGS  06/25/2020   Procedure: BRONCHIAL WASHINGS;  Surgeon: Shelah Lamar RAMAN, MD;  Location: [facility] ENDOSCOPY;  Service: Pulmonary;;   CATARACT EXTRACTION Right    COLONOSCOPY     EYE SURGERY Right    x 4  between ages 30-13   FIDUCIAL MARKER PLACEMENT  06/25/2020   Procedure: FIDUCIAL MARKER PLACEMENT;  Surgeon: Shelah Lamar RAMAN, MD;  Location: [facility] ENDOSCOPY;  Service: Pulmonary;;   THYROIDECTOMY N/A 08/27/2017   Procedure: TOTAL THYROIDECTOMY WITH LIMITED LYMPH NODE DISSECTION;  Surgeon: Eletha Boas, MD;  Location: [facility] ORS;  Service: General;  Laterality: N/A;   UPPER GI ENDOSCOPY     VIDEO BRONCHOSCOPY WITH ENDOBRONCHIAL NAVIGATION N/A 06/25/2020   Procedure: VIDEO BRONCHOSCOPY WITH ENDOBRONCHIAL NAVIGATION;  Surgeon: Shelah Lamar RAMAN, MD;  Location: [facility] ENDOSCOPY;  Service: Pulmonary;  Laterality: N/A;     Family History  Adopted: Yes  Problem Relation Age of Onset   Stroke Neg Hx     Social History   Socioeconomic History   Marital status: Single    Spouse name: Not on file   Number of  children: Not on file   Years of education: Not on file   Highest education level: Some college, no degree  Occupational History   Not on file  Tobacco Use   Smoking status: Former    Current packs/day: 0.00    Average packs/day: 1 pack/day for 40.0 years (40.0 ttl pk-yrs)    Types: Cigarettes    Start date: 05/1979    Quit date: 05/2019    Years since quitting: 5.6   Smokeless tobacco: Never  Vaping Use   Vaping status: Never  Used  Substance and Sexual Activity   Alcohol use: Not Currently    Alcohol/week: 0.0 standard drinks of alcohol    Comment: Used to drink a lot -moderate - quit 2020   Drug use: Not Currently    Types: Marijuana    Comment: last use years ago   Sexual activity: Not Currently  Other Topics Concern   Not on file  Social History Narrative   Pt lives alone    Retired    Social Drivers of Health   Tobacco Use: Medium Risk (12/12/2024)   Patient History    Smoking Tobacco Use: Former    Smokeless Tobacco Use: Never    Passive Exposure: Not on file  Financial Resource Strain: Low Risk (12/11/2024)   Overall Financial Resource Strain (CARDIA)    Difficulty of Paying Living Expenses: Not very hard  Food Insecurity: No Food Insecurity (12/11/2024)   Epic    Worried About Programme Researcher, Broadcasting/film/video in the Last Year: Never true    Ran Out of Food in the Last Year: Never true  Transportation Needs: No Transportation Needs (12/11/2024)   Epic    Lack of Transportation (Medical): No    Lack of Transportation (Non-Medical): No  Physical Activity: Inactive (12/11/2024)   Exercise Vital Sign    Days of Exercise per Week: 0 days    Minutes of Exercise per Session: Not on file  Stress: No Stress Concern Present (12/11/2024)   [employer] of Occupational Health - Occupational Stress Questionnaire    Feeling of Stress: Only a little  Social Connections: Socially Isolated (12/11/2024)   Social Connection and Isolation Panel    Frequency of Communication with Friends and Family: More than three times a week    Frequency of Social Gatherings with Friends and Family: Once a week    Attends Religious Services: Never    Database Administrator or Organizations: No    Attends Engineer, Structural: Not on file    Marital Status: Never married  Depression (PHQ2-9): Low Risk (10/30/2024)   Depression (PHQ2-9)    PHQ-2 Score: 0  Alcohol Screen: Low Risk (12/11/2024)   Alcohol Screen    Last  Alcohol Screening Score (AUDIT): 2  Housing: Low Risk (12/11/2024)   Epic    Unable to Pay for Housing in the Last Year: No    Number of Times Moved in the Last Year: 0    Homeless in the Last Year: No  Utilities: Low Risk (08/30/2024)   Received from [facility]   Utilities    In the past 12 months has the electric, gas, oil, or water company threatened to shut off services in your home? : No  Health Literacy: Not on file       ROS All review of systems negative except what is listed in the HPI    Objective     There were no vitals taken for this visit.  Physical Exam     Assessment & Plan:     Problem List Items Addressed This Visit   None            No follow-ups on file.  Waddell KATHEE Mon, NP  I,Emily Lagle,acting as a scribe for Waddell KATHEE Mon, NP.,have documented all relevant documentation on the behalf of Waddell KATHEE Mon, NP.  I, Waddell KATHEE Mon, NP, have reviewed all documentation for this visit. The documentation on 12/18/2024 for the exam, diagnosis, procedures, and orders are all accurate and complete.    [1]  Outpatient Medications Prior to Visit  Medication Sig   alendronate  (FOSAMAX ) 70 MG tablet TAKE 1 TABLET EVERY 7 DAYS WITH A FULL GLASS OF WATER ON AN EMPTY STOMACH.   aspirin  EC 81 MG tablet Take 1 tablet (81 mg total) by mouth daily. Swallow whole.   atenolol  (TENORMIN ) 100 MG tablet Take 100 mg by mouth 2 (two) times daily.   CALCIUM  PO Take 1 tablet by mouth daily.   Cholecalciferol (VITAMIN D ) 2000 units tablet Take 2,000 Units by mouth daily.   desvenlafaxine (PRISTIQ) 50 MG 24 hr tablet Take 50 mg by mouth daily.   Deutetrabenazine  ER (AUSTEDO  XR) 36 MG TB24 Take 36 mg by mouth daily.   fenofibrate 160 MG tablet Take 160 mg by mouth daily with supper.    hydrocortisone 2.5 % cream Apply 1 application  topically 2 (two) times daily as needed (rash).   levothyroxine  (SYNTHROID ) 150 MCG tablet Take 1 tablet (150 mcg total) by mouth daily.   Multiple  Vitamin (MULTIVITAMIN WITH MINERALS) TABS tablet Take 1 tablet by mouth daily.   omeprazole (PRILOSEC) 20 MG capsule Take 20 mg by mouth daily before breakfast.    rosuvastatin (CRESTOR) 10 MG tablet Take 10 mg by mouth daily.    zolpidem (AMBIEN) 10 MG tablet Take 10 mg by mouth at bedtime as needed.   No facility-administered medications prior to visit.   "

## 2024-12-18 ENCOUNTER — Ambulatory Visit: Admitting: Family Medicine

## 2024-12-18 ENCOUNTER — Encounter: Payer: Self-pay | Admitting: Family Medicine

## 2024-12-18 VITALS — BP 136/57 | HR 58 | Ht 72.0 in | Wt 269.0 lb

## 2024-12-18 DIAGNOSIS — G4700 Insomnia, unspecified: Secondary | ICD-10-CM | POA: Diagnosis not present

## 2024-12-18 DIAGNOSIS — E559 Vitamin D deficiency, unspecified: Secondary | ICD-10-CM | POA: Diagnosis not present

## 2024-12-18 DIAGNOSIS — E890 Postprocedural hypothyroidism: Secondary | ICD-10-CM

## 2024-12-18 DIAGNOSIS — M810 Age-related osteoporosis without current pathological fracture: Secondary | ICD-10-CM | POA: Diagnosis not present

## 2024-12-18 DIAGNOSIS — Z79899 Other long term (current) drug therapy: Secondary | ICD-10-CM

## 2024-12-18 DIAGNOSIS — I10 Essential (primary) hypertension: Secondary | ICD-10-CM

## 2024-12-18 DIAGNOSIS — F32A Depression, unspecified: Secondary | ICD-10-CM | POA: Diagnosis not present

## 2024-12-18 DIAGNOSIS — F419 Anxiety disorder, unspecified: Secondary | ICD-10-CM | POA: Diagnosis not present

## 2024-12-18 DIAGNOSIS — E7800 Pure hypercholesterolemia, unspecified: Secondary | ICD-10-CM | POA: Diagnosis not present

## 2024-12-18 DIAGNOSIS — J449 Chronic obstructive pulmonary disease, unspecified: Secondary | ICD-10-CM | POA: Diagnosis not present

## 2024-12-18 DIAGNOSIS — Z0000 Encounter for general adult medical examination without abnormal findings: Secondary | ICD-10-CM

## 2024-12-18 NOTE — Assessment & Plan Note (Signed)
 Previously well controlled Continue Synthroid  at current dose  Managed by endocrinology

## 2024-12-18 NOTE — Assessment & Plan Note (Signed)
 Managed with Pristiq 50 mg daily. No SI/HI. PHQ9/GAD7 reviewed.  - Continue Pristiq 50 mg daily. - Monitor for mood changes or need for additional mental health support.

## 2024-12-18 NOTE — Assessment & Plan Note (Signed)
 Blood pressure is at goal for age and co-morbidities.  Following with cardiology - due for follow-up (patient to schedule). Recommendations: continue atenolol  100 mg BID - BP goal <130/80 - monitor and log blood pressures at home - check around the same time each day in a relaxed setting - Limit salt to <2000 mg/day - Follow DASH eating plan (heart healthy diet) - limit alcohol to 2 standard drinks per day for men and 1 per day for women - avoid tobacco products - get at least 2 hours of regular aerobic exercise weekly Patient aware of signs/symptoms requiring further/urgent evaluation.

## 2024-12-18 NOTE — Assessment & Plan Note (Signed)
 Asymptomatic. Managed by endocrinology.

## 2024-12-18 NOTE — Assessment & Plan Note (Signed)
 Supplement and monitor. Following with endocrinology for osteoporosis.

## 2024-12-18 NOTE — Assessment & Plan Note (Signed)
 Chronic insomnia managed with nightly Ambien. Current regimen effective. Safety discussed.  - Complete drug screen and contract for Ambien. - Scheduled follow-up every three months for Ambien prescription management.

## 2024-12-18 NOTE — Assessment & Plan Note (Signed)
 Medication management: rosuvastatin 20 mg daily, fenofibrate 160 mg daily Lifestyle factors for lowering cholesterol include: Diet therapy - heart-healthy diet rich in fruits, veggies, fiber-rich whole grains, lean meats, chicken, fish (at least twice a week), fat-free or 1% dairy products; foods low in saturated/trans fats, cholesterol, sodium, and sugar. Mediterranean diet has shown to be very heart healthy. Regular exercise - recommend at least 30 minutes a day, 5 times per week Weight management

## 2024-12-18 NOTE — Assessment & Plan Note (Signed)
 Stable No new symptoms

## 2024-12-19 NOTE — Progress Notes (Signed)
 Remote Loop Recorder Transmission

## 2024-12-20 NOTE — Progress Notes (Unsigned)
 " [facility] [address] [place]. [place] [address] [phone]       OFFICE FOLLOW UP NOTE  [name] Date of Birth:  [birth year] Medical Record Number:  [id number]   Primary neurologist: Dr. Rosemarie Reason for visit: Dyskinesias   No chief complaint on file.     HPI:   Update 12/21/2024 JM: Patient returns for follow-up visit.    Unfortunately, he suffered a right ankle fracture post fall in 07/2024 requiring surgical procedure and discharged to nursing facility.          History provided for reference purposes only Update 08/24/2024 JM: Patient returns for follow-up visit after prior visit with Dr. Rosemarie 4 months ago where Austedo  dose was increased to 12 mg twice daily and gradually tapered off clonazepam  due to no benefit and side effects.  Patient contacted office in August requesting dosage increase as recommended by PCP and dose adjusted to 12 mg a.m. and 18 mg p.m.  He has remained on Austedo  12 mg a.m. and 18mg  (XR) PM. He reports he is tolerating well but has not noticed any significant improvement but also declines progression.  Involuntary movements primarily in face/mouth and upper extremities, can occasionally affect lower extremities. He has noticed some improvement of movement in lower extremities prior to sleep, does not feel movements persist during his sleep. He has noticed more recently, he has sensation where he needs to clear his throat every 5-10 seconds which can last several hours at a time. Denies dysphagia, dysarthria or dysphagia.   Update 04/11/2024 Dr. Rosemarie: He returns for follow-up after last visit 6 weeks ago.  He has started Austedo  6 mg twice daily which is tolerating well without side effects but has not noticed significant improvement in his involuntary facial or hand movements.  He takes Klonopin  0.5 mg 2 tablets at bedtime and feels it is not helping as well and actually makes him feel tired and wants to come off  it.  He was found to have significant thyroid  dysfunction and saw endocrinologist Dr. Trixie who has adjusted his thyroid  medications and he is feeling much better.  He is not having recurrent stroke or TIA symptoms.  He remains on aspirin  which is tolerating well without side effects.   Update 03/01/2024 Dr. Rosemarie: He returns for follow-up after last visit with me a year ago.  Patient has noticed involuntary movements involving his face for the last 3 to 4 months and they seem to have now spread to involve both hands and feet.  These are constant.  He feels like he is fidgeting his hands and feet.  Feels restless.  He was seen by Harlene nurse practitioner at last visit and started on Klonopin  which she feels has helped somewhat but not enough.  He has been having difficult time sleeping as well.  He tried Ambien did not work.  He tried trazodone as well which has not helped.  He has history of longstanding anxiety and was on Xanax  for years but it was discontinued.  He also had a history of heavy alcohol intake about 9 beers per day for 25 years and stopped that as well last year.  His involuntary movements actually began after stopping his alcohol and Xanax .  He has been started on Pristiq 50 mg daily by his PCP for anxiety and feels it is helping.  He has also been referred to psychiatry but has not heard about appointment from them yet.  He denies any  family history of Huntington's disease.  He denies any significant memory loss or cognitive impairment.  He had CT angiogram of the brain and neck done on 02/17/2024 but the official report is not yet back and I have looked at it personally I did not see any acute abnormality.  There may be some moderate narrowing at the origin of the right vertebral artery but all intracranial vessels and carotid bifurcations appear patent.  He has not had any recurrent stroke or TIA symptoms.  Remains on aspirin  she is tolerating well without bruising or bleeding.  States his  blood pressure is under good control.   Update 01/12/2024 JM: Patient returns for 51-month follow-up visit.  His main concern today is in regards to involuntary movements that started about 70 to 5 months ago, initially present in his jaw and gradually started affecting his legs and now affecting his arms.  He has had difficulty eating due to jaw movement and pain as well as affecting his ability to speak, leg movements can become painful especially at night with a severe cramping sensation.  Reports jaw movements disappear during sleep.  Involuntary movements are present all day long but usually overall mild during the day.  Denies any weakness or changes in his gait, does have chronic gait impairment and imbalance after his stroke in 10/2022 but this has been stable.  He was having slight upper extremity tremor initially but this seems to have subsided.  Unsure family history of similar symptoms as he was adopted.  He was started on Pristiq about 3 months ago which has helped reduce panic attacks but continues to struggle with depression/anxiety.  He also continues to struggle with insomnia, use of Ambien with some benefit. He was previously on Xanax  6mg  daily for multiple years but this was stopped back in 05/2023 after ED visit for altered mental status.  He also reports complete EtOH cessation since that time.  He was previously followed by psychiatry but stopped after Xanax  was discontinued.  Denies starting any new medications around the time of onset.  Overall stable from stroke standpoint.  Denies any current memory concerns, feels brain fog has improved.  Remains on aspirin  and Crestor.  Routinely follows with PCP and cardiology for stroke risk factor management.  Loop recorder has not shown atrial fibrillation thus far.  Previously discussed follow-up with [facility] for untreated sleep apnea but feels he no longer has issues with apnea since he stopped EtOH use.  Update 07/06/2023 JM: Patient returns for  follow-up visit unaccompanied.  Overall stable without new stroke/TIA symptoms.  Reports residual imbalance, denies much change since prior visit.  Was working with PT/OT back in May/June, is scheduled 8/14 for PT reevaluation. Does need to use door frames and wall to ambulate, does have RW at home but does not routinely use, denies any recent falls.  Also continued short-term memory issues, denies any worsening since prior visit.  Remains on Crestor without side effects. Reports stopping aspirin  and being placed on Eliquis but ran out of prescription yesterday.  Patient unsure why he was placed on Eliquis, reports was started after hospital stay in June (see below) but per note review, no mention of Eliquis.  Cardiac monitor negative for A-fib, loop recorder placed on 5/14 without evidence of A-fib thus far. Routinely follows with PCP for stroke risk factor management.  Evaluated at [facility] ED on 6/30 with altered mental status.  Per ED note, patient confused with delusional statements.  Reported last  drink 3 weeks ago however friend stated last drink only 3 days PTA, prior to alcohol cessation patient drinking 9-10 beers daily.  Extensive workup largely unremarkable.  Placed on Librium taper with significant for mild symptoms.  He was evaluated by psychiatry who noted long-term use of Xanax  recently discontinued which may also be contributing.  Xanax  restarted and Librium discontinued.  CT head negative for acute findings.  Felt symptoms likely in setting of alcohol and benzo withdrawal. Was d/c'd to SNF rehab, returned back home last week.  Reports complete EtOH cessation since discharge, reports drinking excessive alcohol (9-10 beers daily) over the past 30 years.  Denies any tobacco or drug use.  Consult visit 02/18/2023 Dr. Rosemarie: Mr. Lumm is a 69 year old Caucasian male seen today for initial office consultation visit for stroke.  History is obtained from the patient and review of collateral notes  and electronic medical records.  I have personally reviewed pertinent available imaging films in PACS.  He has past medical history of hypertension, hyperlipidemia, obstructive sleep apnea, depression, COPD, lung adenocarcinoma, gastroesophageal reflux disease, gout, thyroid  cancer.  Patient states for the last 6 months or so his benign send gait and balance difficulties.  He states this began suddenly 1 day when he woke up.  Trouble getting out of bed and he fell down.  He was told that he was walking like a drunk.  He did not seek medical help at that time.  The last few months he feels his gait and balance are improving.  He underwent MRI scan of the brain on 11/07/2022 which showed a 1 cm right cerebellar cortical and white matter acute infarct as well as remote age bilateral basal ganglia and corona radiata lacunar infarcts.  There is mild changes of small vessel disease.  Patient started on aspirin  for stroke prevention since which is tolerating well without bruising or bleeding.  He is also on Crestor tolerating well.  His blood pressure is well-controlled today it is 130/67.  He states that he never been diagnosed with sleep apnea though I find the diagnosis listed past medical history.  He has had no further strokelike symptoms.  Denies any history of syncopal events, palpitations or atrial fibrillation.  He does admit to mild short-term memory and cognitive difficulties.  He lives alone and manages a normal.  Echocardiogram on 11/2522 showed ejection fraction of 65%.  There is mild left atrial dilatation.  Carotid ultrasound showed no significant extracranial stenosis bilaterally.  4 weeks cardiac external monitor showed no evidence of atrial fibrillation with brief runs of supraventricular tachycardia and isolated supraventricular ectopics.  ROS:   14 system review of systems is positive for those listed in HPI and all other systems negative  PMH:  Past Medical History:  Diagnosis Date    Adenocarcinoma, lung, left (HCC) 07/05/2017   Age-related osteoporosis without current pathological fracture 12/12/2021   Agoraphobia, unspecified 06/05/2023   Alcohol dependence with alcohol-induced psychotic disorder with hallucinations (HCC) 06/05/2023   Allergy    Altered mental status, unspecified 06/05/2023   Anxiety    Arthritis    Cataract    Cerebral infarction, unspecified (HCC) 06/05/2023   Cerebral ischemia 06/05/2023   COPD (chronic obstructive pulmonary disease) (HCC)    Depression    Dry eye syndrome    Dyslipidemia 01/05/2023   Dysrhythmia    hx of PAC's   Effusion of elbow joint, right 06/29/2017   Gastric ulcer with perforation but without obstruction (HCC) 04/15/2016   GERD (gastroesophageal  reflux disease)    Gout    HTN (hypertension), benign 02/18/2023   Hyperlipidemia    Hypertension    Hypothyroidism    Hypothyroidism, unspecified 06/05/2023   Malignant neoplasm of thyroid  gland (HCC) 06/05/2023   Muscle wasting and atrophy, not elsewhere classified, unspecified site 06/07/2023   Muscle wasting and atrophy, not elsewhere classified, unspecified site 06/07/2023   Nodule of left lung 02/18/2023   Non-restorative sleep 02/18/2023   OSA (obstructive sleep apnea) 02/18/2023   Papillary thyroid  carcinoma s/p total thyroidectomy & CLND 08/27/2017 08/24/2017   Penetrating ulcer of aorta 02/18/2023   Postsurgical hypothyroidism 10/01/2017   Protrusion of intervertebral disc of thoracic region 02/18/2023   Seborrheic dermatitis 02/18/2023   Stroke [facility])    Substance abuse (HCC)    Thyroid  cancer (HCC)    2018   Wheezing 06/05/2023    Social History:  Social History   Socioeconomic History   Marital status: Single    Spouse name: Not on file   Number of children: Not on file   Years of education: Not on file   Highest education level: Some college, no degree  Occupational History   Not on file  Tobacco Use   Smoking status: Former    Current  packs/day: 0.00    Average packs/day: 1 pack/day for 40.0 years (40.0 ttl pk-yrs)    Types: Cigarettes    Start date: 05/1979    Quit date: 05/2019    Years since quitting: 5.6   Smokeless tobacco: Never  Vaping Use   Vaping status: Never Used  Substance and Sexual Activity   Alcohol use: Not Currently    Alcohol/week: 6.0 standard drinks of alcohol    Types: 6 Cans of beer per week    Comment: Used to drink a lot -moderate - quit 2020   Drug use: Not Currently    Types: Cocaine, Marijuana    Comment: last use years ago   Sexual activity: Not Currently    Birth control/protection: Abstinence  Other Topics Concern   Not on file  Social History Narrative   Pt lives alone    Retired    Social Drivers of Health   Tobacco Use: Medium Risk (12/18/2024)   Patient History    Smoking Tobacco Use: Former    Smokeless Tobacco Use: Never    Passive Exposure: Not on Actuary Strain: Low Risk (12/11/2024)   Overall Financial Resource Strain (CARDIA)    Difficulty of Paying Living Expenses: Not very hard  Food Insecurity: No Food Insecurity (12/11/2024)   Epic    Worried About Programme Researcher, Broadcasting/film/video in the Last Year: Never true    Ran Out of Food in the Last Year: Never true  Transportation Needs: No Transportation Needs (12/11/2024)   Epic    Lack of Transportation (Medical): No    Lack of Transportation (Non-Medical): No  Physical Activity: Inactive (12/11/2024)   Exercise Vital Sign    Days of Exercise per Week: 0 days    Minutes of Exercise per Session: Not on file  Stress: No Stress Concern Present (12/11/2024)   [employer] of Occupational Health - Occupational Stress Questionnaire    Feeling of Stress: Only a little  Social Connections: Socially Isolated (12/11/2024)   Social Connection and Isolation Panel    Frequency of Communication with Friends and Family: More than three times a week    Frequency of Social Gatherings with Friends and Family: Once a week  Attends Religious Services: Never    Active Member of Clubs or Organizations: No    Attends Banker Meetings: Not on file    Marital Status: Never married  Intimate Partner Violence: Unknown (03/06/2022)   Received from [facility]   HITS    Physically Hurt: Not on file    Insult or Talk Down To: Not on file    Threaten Physical Harm: Not on file    Scream or Curse: Not on file  Depression (PHQ2-9): High Risk (12/18/2024)   Depression (PHQ2-9)    PHQ-2 Score: 12  Alcohol Screen: Low Risk (12/11/2024)   Alcohol Screen    Last Alcohol Screening Score (AUDIT): 2  Housing: Low Risk (12/11/2024)   Epic    Unable to Pay for Housing in the Last Year: No    Number of Times Moved in the Last Year: 0    Homeless in the Last Year: No  Utilities: Low Risk (08/30/2024)   Received from [facility]   Utilities    In the past 12 months has the electric, gas, oil, or water company threatened to shut off services in your home? : No  Health Literacy: Not on file    Medications:   Current Outpatient Medications on File Prior to Visit  Medication Sig Dispense Refill   alendronate  (FOSAMAX ) 70 MG tablet TAKE 1 TABLET EVERY 7 DAYS WITH A FULL GLASS OF WATER ON AN EMPTY STOMACH. 12 tablet 1   aspirin  EC 81 MG tablet Take 1 tablet (81 mg total) by mouth daily. Swallow whole.     atenolol  (TENORMIN ) 100 MG tablet Take 100 mg by mouth 2 (two) times daily.     CALCIUM  PO Take 1 tablet by mouth daily.     Cholecalciferol (VITAMIN D ) 2000 units tablet Take 2,000 Units by mouth daily.     desvenlafaxine (PRISTIQ) 50 MG 24 hr tablet Take 50 mg by mouth daily.     Deutetrabenazine  ER (AUSTEDO  XR) 36 MG TB24 Take 36 mg by mouth daily. 30 tablet 11   fenofibrate 160 MG tablet Take 160 mg by mouth daily with supper.      hydrocortisone 2.5 % cream Apply 1 application  topically 2 (two) times daily as needed (rash).     levothyroxine  (SYNTHROID ) 150 MCG tablet Take 1 tablet (150 mcg total) by  mouth daily. 45 tablet 6   Multiple Vitamin (MULTIVITAMIN WITH MINERALS) TABS tablet Take 1 tablet by mouth daily.     omeprazole (PRILOSEC) 20 MG capsule Take 20 mg by mouth daily before breakfast.      rosuvastatin (CRESTOR) 20 MG tablet Take 20 mg by mouth at bedtime.     zolpidem (AMBIEN) 10 MG tablet Take 10 mg by mouth at bedtime as needed.     No current facility-administered medications on file prior to visit.    Allergies:   Allergies  Allergen Reactions   Lorazepam Anxiety and Hypertension    Paranoid  Other Reaction(s): paranoid     Physical Exam There were no vitals filed for this visit.   There is no height or weight on file to calculate BMI.  General: Very pleasant obese unkempt middle-aged Caucasian male, seated, in no evident distress  Neurologic Exam Mental Status: Awake and fully alert. Oriented to place and time. Recent memory mildly impaired and remote memory intact. Attention span, concentration and fund of knowledge mostly appropriate. Mood and affect appropriate.  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  Extraocular movements full without nystagmus. Visual fields full to confrontation in the left eye.SABRA Hearing intact. Facial sensation intact. Face, tongue, palate moves normally and symmetrically.  Motor: Normal bulk and tone. Normal strength in all tested extremity muscles.  Consistent involuntary mild movements of jaw, head, twisting of tongue and persistent hand movements (twisting, piano playing). Intermittent tapping foot movements bilaterally.  Persistently clearing his throat during visit.  No evidence of resting or action tremor.  No cogwheel rigidity. Sensory.: intact to touch , pinprick , position and vibratory sensation.  Coordination: Rapid alternating movements normal in all extremities. Finger-to-nose and heel-to-shin performed accurately bilaterally.   Gait and Station: Arises from chair without  difficulty. Stance is normal. Gait is slightly broad-based demonstrates with moderate unsteadiness, adequate stride length although decreased step height.  Greater difficulty with turns. No use of AD.  Tandem walk and heel toe not attempted. Reflexes: 1+ and symmetric. Toes downgoing.        ASSESSMENT: 69 year old Caucasian male with subacute balance and gait difficulties with onset around 07/2022 with abnormal MRI scan showing a subacute right cerebellar infarct in December 2023 s/p ILR as well as evidence of left basal ganglia, right corona radiata and lentiform chronic infarcts.  Vascular risk factors of obesity, hyperlipidemia, hypertension, at risk for obstructive sleep apnea, EtOH abuse and silent cerebral infarcts.  He also has mild cognitive impairment.  New involuntary orofacial dyskinesia starting around 08/2023 and gradually started affecting his hands and feet raising concern for Huntington's and was started on Austedo  in 02/2024.     PLAN:  1.  Dyskinesias  - Increase Austedo  dosage - will send in new rx for XR 36mg  dosage (currently taking IR 12mg  AM and XR 18mg  PM). Advised to call after 4 weeks if no benefit or sooner if difficulty tolerating.  -if no benefit, will consider resubmitting approval for [place]rezza   - No benefit with clonazepam   -referral placed to academic movement specialty clinic for further evaluation and recommendations  - Labs for reversible causes benign  -HTT genetic test result negative  -does have hx of longstanding anxiety and on Xanax  for many years, symptoms started shortly after discontinuing Xanax  in 05/2023. Has hx of longstanding alcohol use with cessation since 05/2023  2. Right cerebellar infarct 3. Hx of chronic strokes on imaging 4. R VA occlusion  -Continued imbalance and gait unsteadiness - overall stable -CTA head/neck 02/2024 R VA moderate to severe stenosis, left ICA 50 to 60% stenosis -CTA head/neck 03/2023 showed right VA occlusion  possibly responsible for stroke, also 50% narrowing of left ICA.   -Continue aspirin  and Crestor for secondary stroke prevention measures managed by PCP  -Continue close PCP follow-up for aggressive stroke risk factor management     Follow-up in 3-4 months or call earlier if needed    I personally spent a total of 42 minutes in the care of the patient today including preparing to see the patient, getting/reviewing separately obtained history, performing a medically appropriate exam/evaluation, counseling and educating, placing orders, and documenting clinical information in the EHR.   Harlene Bogaert, AGNP-BC  [facility] [address] [place], [place] [address]  Phone [phone] Fax [phone] Note: This document was prepared with digital dictation and possible smart phrase technology. Any transcriptional errors that result from this process are unintentional.   "

## 2024-12-21 ENCOUNTER — Ambulatory Visit: Admitting: Adult Health

## 2024-12-21 ENCOUNTER — Encounter: Payer: Self-pay | Admitting: Adult Health

## 2024-12-21 VITALS — BP 132/73 | HR 59 | Ht 72.0 in | Wt 267.6 lb

## 2024-12-21 DIAGNOSIS — I639 Cerebral infarction, unspecified: Secondary | ICD-10-CM | POA: Diagnosis not present

## 2024-12-21 DIAGNOSIS — G2401 Drug induced subacute dyskinesia: Secondary | ICD-10-CM | POA: Diagnosis not present

## 2024-12-21 DIAGNOSIS — G244 Idiopathic orofacial dystonia: Secondary | ICD-10-CM

## 2024-12-21 MED ORDER — AUSTEDO XR 42 MG PO TB24: 42.0000 mg | ORAL_TABLET | Freq: Every day | ORAL | 5 refills | Status: AC

## 2024-12-21 NOTE — Patient Instructions (Addendum)
 Increase Austedo  to 42mg  daily   Please call with any difficulty tolerating   Can consider further increasing to 48mg  but this will be the max dose     Follow up in 3-4 months or call earlier if needed

## 2025-01-03 ENCOUNTER — Ambulatory Visit

## 2025-01-05 ENCOUNTER — Other Ambulatory Visit: Payer: Self-pay

## 2025-01-13 ENCOUNTER — Ambulatory Visit

## 2025-01-25 ENCOUNTER — Ambulatory Visit: Payer: Medicare HMO | Admitting: Internal Medicine

## 2025-01-29 ENCOUNTER — Other Ambulatory Visit

## 2025-02-03 ENCOUNTER — Ambulatory Visit

## 2025-02-13 ENCOUNTER — Ambulatory Visit

## 2025-03-16 ENCOUNTER — Ambulatory Visit

## 2025-03-19 ENCOUNTER — Ambulatory Visit: Admitting: Physician Assistant

## 2025-03-21 ENCOUNTER — Ambulatory Visit: Admitting: Adult Health

## 2025-04-16 ENCOUNTER — Ambulatory Visit

## 2025-04-30 ENCOUNTER — Inpatient Hospital Stay

## 2025-04-30 ENCOUNTER — Inpatient Hospital Stay: Admitting: Hematology & Oncology

## 2025-05-17 ENCOUNTER — Ambulatory Visit

## 2025-06-11 ENCOUNTER — Ambulatory Visit: Admitting: Internal Medicine
